# Patient Record
Sex: Female | Born: 1965 | Race: White | Hispanic: No | Marital: Married | Smoking: Former smoker
Health system: Southern US, Community
[De-identification: ages and names within clinical notes are randomized; demographics above are authoritative.]

## PROBLEM LIST (undated history)

## (undated) DIAGNOSIS — E119 Type 2 diabetes mellitus without complications: Secondary | ICD-10-CM

## (undated) DIAGNOSIS — I10 Essential (primary) hypertension: Secondary | ICD-10-CM

## (undated) DIAGNOSIS — E785 Hyperlipidemia, unspecified: Secondary | ICD-10-CM

## (undated) DIAGNOSIS — G4730 Sleep apnea, unspecified: Secondary | ICD-10-CM

## (undated) DIAGNOSIS — T7840XA Allergy, unspecified, initial encounter: Secondary | ICD-10-CM

## (undated) DIAGNOSIS — K219 Gastro-esophageal reflux disease without esophagitis: Secondary | ICD-10-CM

## (undated) HISTORY — DX: Allergy, unspecified, initial encounter: T78.40XA

## (undated) HISTORY — DX: Hyperlipidemia, unspecified: E78.5

## (undated) HISTORY — DX: Sleep apnea, unspecified: G47.30

## (undated) HISTORY — DX: Type 2 diabetes mellitus without complications: E11.9

## (undated) HISTORY — DX: Gastro-esophageal reflux disease without esophagitis: K21.9

---

## 2007-02-15 ENCOUNTER — Inpatient Hospital Stay (HOSPITAL_COMMUNITY): Admission: AD | Admit: 2007-02-15 | Discharge: 2007-02-15 | Payer: Self-pay | Admitting: Obstetrics and Gynecology

## 2007-02-15 ENCOUNTER — Emergency Department (HOSPITAL_COMMUNITY): Admission: EM | Admit: 2007-02-15 | Discharge: 2007-02-15 | Payer: Self-pay | Admitting: Emergency Medicine

## 2011-02-14 ENCOUNTER — Observation Stay (HOSPITAL_COMMUNITY): Payer: Worker's Compensation

## 2011-02-14 ENCOUNTER — Encounter (HOSPITAL_COMMUNITY)
Admission: EM | Disposition: A | Discharge status: Home / Self Care | Payer: Self-pay | Source: Home / Self Care | Attending: Emergency Medicine

## 2011-02-14 ENCOUNTER — Observation Stay (HOSPITAL_COMMUNITY)
Admission: EM | Admit: 2011-02-14 | Discharge: 2011-02-15 | Disposition: A | Discharge status: Home / Self Care | Payer: Worker's Compensation | Attending: Neurological Surgery | Admitting: Neurological Surgery

## 2011-02-14 ENCOUNTER — Emergency Department (HOSPITAL_COMMUNITY): Payer: Worker's Compensation

## 2011-02-14 ENCOUNTER — Encounter (HOSPITAL_COMMUNITY): Payer: Self-pay | Admitting: Emergency Medicine

## 2011-02-14 ENCOUNTER — Other Ambulatory Visit: Payer: Self-pay

## 2011-02-14 ENCOUNTER — Encounter (HOSPITAL_COMMUNITY): Payer: Self-pay | Admitting: Certified Registered"

## 2011-02-14 ENCOUNTER — Observation Stay (HOSPITAL_COMMUNITY): Payer: Worker's Compensation | Admitting: Certified Registered"

## 2011-02-14 DIAGNOSIS — R209 Unspecified disturbances of skin sensation: Secondary | ICD-10-CM | POA: Insufficient documentation

## 2011-02-14 DIAGNOSIS — M50223 Other cervical disc displacement at C6-C7 level: Secondary | ICD-10-CM | POA: Diagnosis present

## 2011-02-14 DIAGNOSIS — M47812 Spondylosis without myelopathy or radiculopathy, cervical region: Secondary | ICD-10-CM | POA: Insufficient documentation

## 2011-02-14 DIAGNOSIS — M5020 Other cervical disc displacement, unspecified cervical region: Principal | ICD-10-CM | POA: Insufficient documentation

## 2011-02-14 DIAGNOSIS — I10 Essential (primary) hypertension: Secondary | ICD-10-CM | POA: Insufficient documentation

## 2011-02-14 DIAGNOSIS — Z23 Encounter for immunization: Secondary | ICD-10-CM | POA: Insufficient documentation

## 2011-02-14 DIAGNOSIS — F17200 Nicotine dependence, unspecified, uncomplicated: Secondary | ICD-10-CM | POA: Insufficient documentation

## 2011-02-14 DIAGNOSIS — M79609 Pain in unspecified limb: Secondary | ICD-10-CM | POA: Insufficient documentation

## 2011-02-14 HISTORY — DX: Essential (primary) hypertension: I10

## 2011-02-14 HISTORY — PX: ANTERIOR CERVICAL DECOMP/DISCECTOMY FUSION: SHX1161

## 2011-02-14 LAB — CBC
MCH: 29.3 pg (ref 26.0–34.0)
MCV: 86.8 fL (ref 78.0–100.0)
Platelets: 280 10*3/uL (ref 150–400)
RBC: 4.91 MIL/uL (ref 3.87–5.11)
RDW: 13.5 % (ref 11.5–15.5)
WBC: 11.3 10*3/uL — ABNORMAL HIGH (ref 4.0–10.5)

## 2011-02-14 LAB — BASIC METABOLIC PANEL
Calcium: 8.5 mg/dL (ref 8.4–10.5)
Creatinine, Ser: 0.82 mg/dL (ref 0.50–1.10)
GFR calc non Af Amer: 85 mL/min — ABNORMAL LOW (ref >90)
Glucose, Bld: 121 mg/dL — ABNORMAL HIGH (ref 70–99)
Sodium: 138 mEq/L (ref 135–145)

## 2011-02-14 LAB — URINALYSIS, MICROSCOPIC ONLY
Bilirubin Urine: NEGATIVE
Nitrite: NEGATIVE
Protein, ur: 30 mg/dL — AB
Specific Gravity, Urine: 1.021 (ref 1.005–1.030)
Urobilinogen, UA: 0.2 mg/dL (ref 0.0–1.0)

## 2011-02-14 LAB — CK: Total CK: 78 U/L (ref 7–177)

## 2011-02-14 SURGERY — ANTERIOR CERVICAL DECOMPRESSION/DISCECTOMY FUSION 1 LEVEL
Anesthesia: General | Site: Neck | Wound class: Clean

## 2011-02-14 MED ORDER — HYDROMORPHONE HCL PF 1 MG/ML IJ SOLN
1.0000 mg | Freq: Once | INTRAMUSCULAR | Status: AC
  Administered 2011-02-14: 1 mg via INTRAVENOUS
  Filled 2011-02-14: qty 1

## 2011-02-14 MED ORDER — SUCCINYLCHOLINE CHLORIDE 20 MG/ML IJ SOLN
INTRAMUSCULAR | Status: DC | PRN
  Administered 2011-02-14: 100 mg via INTRAVENOUS

## 2011-02-14 MED ORDER — PROPOFOL 10 MG/ML IV EMUL
INTRAVENOUS | Status: DC | PRN
  Administered 2011-02-14: 200 mg via INTRAVENOUS

## 2011-02-14 MED ORDER — ONDANSETRON HCL 4 MG/2ML IJ SOLN
4.0000 mg | INTRAMUSCULAR | Status: DC | PRN
  Administered 2011-02-14: 4 mg via INTRAVENOUS
  Filled 2011-02-14: qty 2

## 2011-02-14 MED ORDER — ROCURONIUM BROMIDE 100 MG/10ML IV SOLN
INTRAVENOUS | Status: DC | PRN
  Administered 2011-02-14: 40 mg via INTRAVENOUS
  Administered 2011-02-14 (×2): 10 mg via INTRAVENOUS

## 2011-02-14 MED ORDER — BUPIVACAINE HCL (PF) 0.25 % IJ SOLN
INTRAMUSCULAR | Status: DC | PRN
  Administered 2011-02-14: 6 mL

## 2011-02-14 MED ORDER — PHENYLEPHRINE HCL 10 MG/ML IJ SOLN
INTRAMUSCULAR | Status: DC | PRN
  Administered 2011-02-14: 40 ug via INTRAVENOUS

## 2011-02-14 MED ORDER — KETOROLAC TROMETHAMINE 30 MG/ML IJ SOLN
INTRAMUSCULAR | Status: AC
  Filled 2011-02-14: qty 1

## 2011-02-14 MED ORDER — HEMOSTATIC AGENTS (NO CHARGE) OPTIME
TOPICAL | Status: DC | PRN
  Administered 2011-02-14: 1 via TOPICAL

## 2011-02-14 MED ORDER — DEXAMETHASONE SODIUM PHOSPHATE 10 MG/ML IJ SOLN
10.0000 mg | Freq: Once | INTRAMUSCULAR | Status: AC
  Administered 2011-02-14: 10 mg via INTRAVENOUS
  Filled 2011-02-14: qty 1

## 2011-02-14 MED ORDER — LISINOPRIL 10 MG PO TABS
10.0000 mg | ORAL_TABLET | Freq: Every day | ORAL | Status: DC
  Administered 2011-02-15: 10 mg via ORAL
  Filled 2011-02-14 (×2): qty 1

## 2011-02-14 MED ORDER — CEFAZOLIN SODIUM 1-5 GM-% IV SOLN
INTRAVENOUS | Status: DC | PRN
  Administered 2011-02-14: 2 g via INTRAVENOUS

## 2011-02-14 MED ORDER — KETOROLAC TROMETHAMINE 30 MG/ML IJ SOLN
15.0000 mg | Freq: Once | INTRAMUSCULAR | Status: AC | PRN
  Administered 2011-02-14: 30 mg via INTRAVENOUS

## 2011-02-14 MED ORDER — MORPHINE SULFATE 4 MG/ML IJ SOLN: 1.0000 mg | INTRAMUSCULAR | Status: DC | PRN

## 2011-02-14 MED ORDER — NEOSTIGMINE METHYLSULFATE 1 MG/ML IJ SOLN
INTRAMUSCULAR | Status: DC | PRN
  Administered 2011-02-14: 5 mg via INTRAVENOUS

## 2011-02-14 MED ORDER — PNEUMOCOCCAL VAC POLYVALENT 25 MCG/0.5ML IJ INJ
0.5000 mL | INJECTION | INTRAMUSCULAR | Status: AC
  Administered 2011-02-15: 0.5 mL via INTRAMUSCULAR
  Filled 2011-02-14: qty 0.5

## 2011-02-14 MED ORDER — ACETAMINOPHEN 325 MG PO TABS: 650.0000 mg | ORAL_TABLET | ORAL | Status: DC | PRN

## 2011-02-14 MED ORDER — PROMETHAZINE HCL 25 MG/ML IJ SOLN
6.2500 mg | INTRAMUSCULAR | Status: DC | PRN
  Filled 2011-02-14: qty 1

## 2011-02-14 MED ORDER — ALBUTEROL SULFATE (5 MG/ML) 0.5% IN NEBU
2.5000 mg | INHALATION_SOLUTION | Freq: Four times a day (QID) | RESPIRATORY_TRACT | Status: DC | PRN
  Administered 2011-02-14: 2.5 mg via RESPIRATORY_TRACT

## 2011-02-14 MED ORDER — SODIUM CHLORIDE 0.9 % IJ SOLN: 3.0000 mL | INTRAMUSCULAR | Status: DC | PRN

## 2011-02-14 MED ORDER — MIDAZOLAM HCL 5 MG/5ML IJ SOLN
INTRAMUSCULAR | Status: DC | PRN
  Administered 2011-02-14: 2 mg via INTRAVENOUS

## 2011-02-14 MED ORDER — SODIUM CHLORIDE 0.9 % IV SOLN: 250.0000 mL | INTRAVENOUS | Status: DC

## 2011-02-14 MED ORDER — 0.9 % SODIUM CHLORIDE (POUR BTL) OPTIME
TOPICAL | Status: DC | PRN
  Administered 2011-02-14: 1000 mL

## 2011-02-14 MED ORDER — LIDOCAINE-EPINEPHRINE 1 %-1:100000 IJ SOLN
INTRAMUSCULAR | Status: DC | PRN
  Administered 2011-02-14: 6 mL

## 2011-02-14 MED ORDER — FENTANYL CITRATE 0.05 MG/ML IJ SOLN
INTRAMUSCULAR | Status: DC | PRN
  Administered 2011-02-14: 50 ug via INTRAVENOUS
  Administered 2011-02-14: 150 ug via INTRAVENOUS

## 2011-02-14 MED ORDER — LACTATED RINGERS IV SOLN
INTRAVENOUS | Status: DC | PRN
  Administered 2011-02-14 (×2): via INTRAVENOUS

## 2011-02-14 MED ORDER — HYDROMORPHONE HCL PF 1 MG/ML IJ SOLN: 0.2500 mg | INTRAMUSCULAR | Status: DC | PRN

## 2011-02-14 MED ORDER — MORPHINE SULFATE 4 MG/ML IJ SOLN
8.0000 mg | Freq: Once | INTRAMUSCULAR | Status: AC
  Administered 2011-02-14: 8 mg via INTRAVENOUS
  Filled 2011-02-14: qty 2

## 2011-02-14 MED ORDER — ACETAMINOPHEN 650 MG RE SUPP: 650.0000 mg | RECTAL | Status: DC | PRN

## 2011-02-14 MED ORDER — ALUM & MAG HYDROXIDE-SIMETH 200-200-20 MG/5ML PO SUSP: 30.0000 mL | Freq: Four times a day (QID) | ORAL | Status: DC | PRN

## 2011-02-14 MED ORDER — ONDANSETRON HCL 4 MG/2ML IJ SOLN
INTRAMUSCULAR | Status: DC | PRN
  Administered 2011-02-14: 4 mg via INTRAVENOUS

## 2011-02-14 MED ORDER — THROMBIN 5000 UNITS EX SOLR
CUTANEOUS | Status: DC | PRN
  Administered 2011-02-14 (×2): 5000 [IU] via TOPICAL

## 2011-02-14 MED ORDER — GADOBENATE DIMEGLUMINE 529 MG/ML IV SOLN
20.0000 mL | Freq: Once | INTRAVENOUS | Status: AC
  Administered 2011-02-14: 20 mL via INTRAVENOUS

## 2011-02-14 MED ORDER — ONDANSETRON HCL 4 MG/2ML IJ SOLN: 4.0000 mg | INTRAMUSCULAR | Status: DC | PRN

## 2011-02-14 MED ORDER — OXYCODONE-ACETAMINOPHEN 5-325 MG PO TABS: 1.0000 | ORAL_TABLET | ORAL | Status: DC | PRN

## 2011-02-14 MED ORDER — SODIUM CHLORIDE 0.9 % IJ SOLN: 3.0000 mL | Freq: Two times a day (BID) | INTRAMUSCULAR | Status: DC

## 2011-02-14 MED ORDER — LACTATED RINGERS IV SOLN: INTRAVENOUS | Status: DC

## 2011-02-14 MED ORDER — SODIUM CHLORIDE 0.9 % IV SOLN
INTRAVENOUS | Status: DC
  Administered 2011-02-14: 18:00:00 via INTRAVENOUS

## 2011-02-14 MED ORDER — DIAZEPAM 5 MG PO TABS: 5.0000 mg | ORAL_TABLET | Freq: Four times a day (QID) | ORAL | Status: DC | PRN

## 2011-02-14 MED ORDER — MEPERIDINE HCL 25 MG/ML IJ SOLN: 6.2500 mg | INTRAMUSCULAR | Status: DC | PRN

## 2011-02-14 MED ORDER — GLYCOPYRROLATE 0.2 MG/ML IJ SOLN
INTRAMUSCULAR | Status: DC | PRN
  Administered 2011-02-14: .7 mg via INTRAVENOUS

## 2011-02-14 MED ORDER — SODIUM CHLORIDE 0.9 % IR SOLN
Status: DC | PRN
  Administered 2011-02-14: 20:00:00

## 2011-02-14 SURGICAL SUPPLY — 61 items
BAG DECANTER FOR FLEXI CONT (MISCELLANEOUS) ×2 IMPLANT
BANDAGE GAUZE ELAST BULKY 4 IN (GAUZE/BANDAGES/DRESSINGS) ×4 IMPLANT
BIT DRILL 14MM (INSTRUMENTS) ×1 IMPLANT
BIT DRILL NEURO 2X3.1 SFT TUCH (MISCELLANEOUS) ×1 IMPLANT
BUR BARREL STRAIGHT FLUTE 4.0 (BURR) ×2 IMPLANT
CAGE CERVICAL TRANZGRAFT 7MM (Cage) ×2 IMPLANT
CANISTER SUCTION 2500CC (MISCELLANEOUS) ×2 IMPLANT
CLOTH BEACON ORANGE TIMEOUT ST (SAFETY) ×2 IMPLANT
CONT SPEC 4OZ CLIKSEAL STRL BL (MISCELLANEOUS) ×4 IMPLANT
DECANTER SPIKE VIAL GLASS SM (MISCELLANEOUS) ×2 IMPLANT
DERMABOND ADVANCED (GAUZE/BANDAGES/DRESSINGS) ×1
DERMABOND ADVANCED .7 DNX12 (GAUZE/BANDAGES/DRESSINGS) ×1 IMPLANT
DRAPE LAPAROTOMY 100X72 PEDS (DRAPES) ×2 IMPLANT
DRAPE MICROSCOPE LEICA (MISCELLANEOUS) IMPLANT
DRAPE POUCH INSTRU U-SHP 10X18 (DRAPES) ×2 IMPLANT
DRESSING TELFA 8X3 (GAUZE/BANDAGES/DRESSINGS) IMPLANT
DRILL 14MM (INSTRUMENTS) ×2
DRILL NEURO 2X3.1 SOFT TOUCH (MISCELLANEOUS) ×2
DRSG OPSITE 4X5.5 SM (GAUZE/BANDAGES/DRESSINGS) IMPLANT
DURAPREP 6ML APPLICATOR 50/CS (WOUND CARE) ×2 IMPLANT
ELECT REM PT RETURN 9FT ADLT (ELECTROSURGICAL) ×2
ELECTRODE REM PT RTRN 9FT ADLT (ELECTROSURGICAL) ×1 IMPLANT
GAUZE SPONGE 4X4 16PLY XRAY LF (GAUZE/BANDAGES/DRESSINGS) IMPLANT
GLOVE BIO SURGEON STRL SZ 6 (GLOVE) ×4 IMPLANT
GLOVE BIO SURGEON STRL SZ7.5 (GLOVE) IMPLANT
GLOVE BIOGEL PI IND STRL 6.5 (GLOVE) ×1 IMPLANT
GLOVE BIOGEL PI IND STRL 7.5 (GLOVE) IMPLANT
GLOVE BIOGEL PI IND STRL 8 (GLOVE) ×1 IMPLANT
GLOVE BIOGEL PI IND STRL 8.5 (GLOVE) ×1 IMPLANT
GLOVE BIOGEL PI INDICATOR 6.5 (GLOVE) ×1
GLOVE BIOGEL PI INDICATOR 7.5 (GLOVE)
GLOVE BIOGEL PI INDICATOR 8 (GLOVE) ×1
GLOVE BIOGEL PI INDICATOR 8.5 (GLOVE) ×1
GLOVE ECLIPSE 7.5 STRL STRAW (GLOVE) ×2 IMPLANT
GLOVE ECLIPSE 8.5 STRL (GLOVE) ×2 IMPLANT
GLOVE EXAM NITRILE LRG STRL (GLOVE) IMPLANT
GLOVE EXAM NITRILE MD LF STRL (GLOVE) IMPLANT
GLOVE EXAM NITRILE XL STR (GLOVE) IMPLANT
GLOVE EXAM NITRILE XS STR PU (GLOVE) IMPLANT
GOWN BRE IMP SLV AUR LG STRL (GOWN DISPOSABLE) ×4 IMPLANT
GOWN BRE IMP SLV AUR XL STRL (GOWN DISPOSABLE) IMPLANT
GOWN STRL REIN 2XL LVL4 (GOWN DISPOSABLE) ×2 IMPLANT
HEAD HALTER (SOFTGOODS) ×2 IMPLANT
KIT BASIN OR (CUSTOM PROCEDURE TRAY) ×2 IMPLANT
KIT ROOM TURNOVER OR (KITS) ×2 IMPLANT
NEEDLE HYPO 22GX1.5 SAFETY (NEEDLE) ×2 IMPLANT
NEEDLE SPNL 22GX3.5 QUINCKE BK (NEEDLE) ×2 IMPLANT
NS IRRIG 1000ML POUR BTL (IV SOLUTION) ×2 IMPLANT
PACK LAMINECTOMY NEURO (CUSTOM PROCEDURE TRAY) ×2 IMPLANT
PAD ARMBOARD 7.5X6 YLW CONV (MISCELLANEOUS) ×6 IMPLANT
PLATE TRESTLE LUXE 12 1LVL (Plate) ×2 IMPLANT
PUTTY BONE 2.5CC ×2 IMPLANT
RUBBERBAND STERILE (MISCELLANEOUS) IMPLANT
SCREW 14MM (Screw) ×8 IMPLANT
SPONGE INTESTINAL PEANUT (DISPOSABLE) ×2 IMPLANT
SPONGE SURGIFOAM ABS GEL SZ50 (HEMOSTASIS) ×2 IMPLANT
SUT VIC AB 3-0 SH 8-18 (SUTURE) ×2 IMPLANT
SYR 20ML ECCENTRIC (SYRINGE) ×2 IMPLANT
TOWEL OR 17X24 6PK STRL BLUE (TOWEL DISPOSABLE) ×2 IMPLANT
TOWEL OR 17X26 10 PK STRL BLUE (TOWEL DISPOSABLE) ×2 IMPLANT
WATER STERILE IRR 1000ML POUR (IV SOLUTION) ×2 IMPLANT

## 2011-02-14 NOTE — ED Notes (Signed)
Pt ambulatory to restroom with s/o in no acute distress. Denies needs or complaints.

## 2011-02-14 NOTE — ED Provider Notes (Signed)
History     CSN: [id number]  Arrival date & time 02/14/11  4098   First MD Initiated Contact with Patient 02/14/11 [phone]      Chief Complaint  Patient presents with  . 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 states pain in right arm since no cp or sob. no burn noted.    (Consider location/radiation/quality/duration/timing/severity/associated sxs/prior treatment) HPI Comments: Patient presents to the emergency department with chief complaint of right arm pain.  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  Ever since this incident patient has had numbness and tingling of the extremity.  She states she has weakness of his extremities well.  Patient denies headaches, skin burns, shortness of breath, chest pain, chills.  Patient states the incident has been associated with nausea and diaphoresis.  Patient has no other current complaints.  The history is provided by the patient.    Past Medical History  Diagnosis Date  . Hypertension     History reviewed. No pertinent past surgical history.  History reviewed. No pertinent family history.  History  Substance Use Topics  . Smoking status: Current Everyday Smoker -- 0.5 packs/day    Types: Cigarettes  . Smokeless tobacco: Not on file  . Alcohol Use: Yes     occassionally    OB History    Grav Para Term Preterm Abortions TAB SAB Ect Mult Living                  Review of Systems  Constitutional: Negative for fever, chills and appetite change.  HENT: Negative for congestion.   Eyes: Negative for visual disturbance.  Respiratory: Negative for shortness of breath.   Cardiovascular: Negative for chest pain and leg swelling.  Gastrointestinal: Negative for abdominal pain.  Genitourinary: Negative for dysuria, urgency and frequency.  Musculoskeletal: Positive for  myalgias.  Neurological: Positive for weakness and numbness. Negative for dizziness, tremors, syncope, facial asymmetry, speech difficulty, light-headedness and headaches.  Psychiatric/Behavioral: Negative for confusion.  All other systems reviewed and are negative.    Allergies  Sulfa antibiotics  Home Medications   Current Outpatient Rx  Name Route Sig Dispense Refill  . VITAMIN D PO Oral Take 1 capsule by mouth 2 (two) times daily.    Marland Kitchen LISINOPRIL 10 MG PO TABS Oral Take 10 mg by mouth at bedtime.      BP 108/82  Pulse 71  Temp(Src) 98 F (36.7 C) (Oral)  Resp 18  SpO2 98%  LMP 01/29/2011  Physical Exam  Nursing note and vitals reviewed. Constitutional: She is oriented to person, place, and time. She appears well-developed and well-nourished. No distress.  HENT:  Head: Normocephalic and atraumatic.  Eyes: Conjunctivae and EOM are normal. Pupils are equal, round, and reactive to light. No scleral icterus.  Neck: Normal range of motion. Neck supple. No JVD present. Spinous process tenderness and muscular tenderness present. Carotid bruit is not present. No rigidity. No Brudzinski's sign noted.         Tenderness to palpation of C-spine.  Numbness and tingling of right upper extremity with flexion and extension of neck.   Cardiovascular: Normal rate, regular rhythm, normal heart sounds and intact distal pulses.   Pulmonary/Chest: Effort normal and breath sounds normal. No respiratory distress. She has no wheezes. She has no rales.  Musculoskeletal: Normal  range of motion.       Right shoulder: She exhibits tenderness, bony tenderness and pain. She exhibits normal strength.       Right elbow: tenderness found.       Right wrist: She exhibits tenderness and bony tenderness.       No injection no weakness noted on physical exam.  Pain with passive and active range of motion of right wrist, elbow, and shoulder.  2 point discrimination intact.  Lymphadenopathy:    She has no  cervical adenopathy.  Neurological: She is alert and oriented to person, place, and time. She has normal strength. No cranial nerve deficit or sensory deficit. She displays a negative Romberg sign. Coordination and gait normal. GCS eye subscore is 4. GCS verbal subscore is 5. GCS motor subscore is 6.       A&O x3.  Able to follow commands. PERRL, EOMs, no vertical or bidirectional nystagmus. Shoulder shrug, facial muscles, tongue protrusion and swallow intact.  Motor strength 5/5 bilaterally including grip strength, triceps, hamstrings and ankle dorsiflexion.  Normal patellar DTRs.  Light touch intact in all 4 distal limbs.  Intact finger to nose, shin to heel and rapid alternating movements. No ataxia or dysequilibrium.   Skin: Skin is warm and dry. No rash noted. She is not diaphoretic.  Psychiatric: She has a normal mood and affect. Her behavior is normal.    ED Course  Procedures (including critical care time)  Labs Reviewed  BASIC METABOLIC PANEL - Abnormal; Notable for the following:    Glucose, Bld 121 (*)    GFR calc non Af Amer 85 (*)    All other components within normal limits  CBC - Abnormal; Notable for the following:    WBC 11.3 (*)    All other components within normal limits  CK   Dg Cervical Spine Complete  02/14/2011  *RADIOLOGY REPORT*  Clinical Data: Right neck pain and arm pain  CERVICAL SPINE - COMPLETE 4+ VIEW  Comparison: None.  Findings: The odontoid is intact and the lateral masses are well- aligned.  The AP and lateral cervical alignment are normal.  The prevertebral soft tissue stripe is within normal limits.  The oblique views reveal no gross osseous encroachment upon the neural foramina bilaterally. There is no evidence of fracture or dislocation.  IMPRESSION: Normal cervical spine.  If there is clinical concern regarding herniated disc, MRI may be of help.  Original Report Authenticated By: Brandon Melnick, M.D.   Dg Shoulder Right  02/14/2011  *RADIOLOGY REPORT*   Clinical Data: Right arm pain  RIGHT SHOULDER - 2+ VIEW  Comparison: None.  Findings: [facility] joint is intact and the subacromial space is maintained.  There is no evidence of fracture or dislocation.  No osseous lesions.  IMPRESSION: Normal right shoulder.  If there is clinical concern regarding a rotator cuff injury, MRI may be of help.  Original Report Authenticated By: Brandon Melnick, M.D.   Dg Elbow Complete Right  02/14/2011  *RADIOLOGY REPORT*  Clinical Data: Right arm pain  RIGHT ELBOW - COMPLETE 3+ VIEW  Comparison: None.  Findings: There is no evidence of bone, joint, or soft tissue abnormality.  IMPRESSION: Negative right elbow.  Original Report Authenticated By: Brandon Melnick, M.D.   Dg Wrist Complete Right  02/14/2011  *RADIOLOGY REPORT*  Clinical Data: Right arm pain  RIGHT WRIST - COMPLETE 3+ VIEW  Comparison: None.  Findings: There is no evidence of bone, joint, or soft tissue  abnormality.  IMPRESSION: Negative right wrist.  Original Report Authenticated By: Brandon Melnick, M.D.     No diagnosis found.    MDM  Paresthesias, right arm  Patient being transferred to CDU for observation and pending MRI of neck.  Patient has been seen and discussed with Dr. Brooke Dare who agrees with plan to move the patient.  Patient's labs and radiologic results have been discussed as well as the plan to move to CDU.  Patient is agreeable with plan.  Patient's pain is currently been managed in the emergency department with morphine and she currently has no complaints.  Grant Fontana PA-C will resume patient care and we'll treat patient accordingly in regards to results of MRI.  If results are within normal limits it is recommended the patient be sent home with gabapentin painkillers.        Jaci Carrel, [place] 02/14/11 1150

## 2011-02-14 NOTE — Anesthesia Postprocedure Evaluation (Signed)
  Anesthesia Post-op Note  Patient: Janice Lucas  Procedure(s) Performed:  ANTERIOR CERVICAL DECOMPRESSION/DISCECTOMY FUSION 1 LEVEL - Anterior Cervical Six-Seven Decompression and Fusion  Patient Location: PACU  Anesthesia Type: General  Level of Consciousness: awake  Airway and Oxygen Therapy: Patient Spontanous Breathing  Post-op Pain: mild  Post-op Assessment: Post-op Vital signs reviewed  Post-op Vital Signs: stable  Complications: No apparent anesthesia complications

## 2011-02-14 NOTE — ED Provider Notes (Signed)
Medical screening examination/treatment/procedure(s) were conducted as a shared visit with non-physician practitioner(s) and myself.  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 She had a shock she up her right upper terminate into her neck. She's had paresthesias since.  5 out of 5 strength in the right upper extremity. She has for range of motion although with pain. Strong grip strength.  Imaging was reviewed and negative for fracture. She will receive an MRI of her cervical spine to rule out radicular symptoms. She'll be observed in the CDU. Pain control. Likely discharge  Dayton Bailiff, MD 02/14/11 1155

## 2011-02-14 NOTE — H&P (Signed)
Stefani Dama, MD Physician Signed Neurosurgery Consult Note 02/14/2011 5:21 PM   Chief Complaint: herniated nucleus pulposus C6-C7 right  Referring Physician: Dr. Dayton Bailiff  Janice Lucas is an 46 y.o. female.  HPI: Patient is a 46 year old right-handed white female who has had the sudden and severe onset of pain in actual shoulder and right arm. 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 Since that time the pain has persisted and she has tingling dysesthesias into the second third and fourth digits on the right hand in addition to pain in the shoulder or the region of the scapula with radiation into the triceps region and also into the form of the right hand. She is evaluated [facility] where an MRI of the cervical spine was performed, this demonstrates the presence of an extruded fragment of disc at C6-C7 on the right side. There is effacement of the right side of the cord.    Past Medical History    Diagnosis  Date    .  Hypertension      History reviewed. No pertinent past surgical history.  History reviewed. No pertinent family history.  Social History: reports that she has been smoking Cigarettes. She has been smoking about .5 packs per day. She does not have any smokeless tobacco history on file. She reports that she drinks alcohol. Her drug history not on file.  Allergies:    Allergies    Allergen  Reactions    .  Sulfa Antibiotics  Other (See Comments)      Acts "goofy"     Medications: I have reviewed the patient's current medications.    Results for orders placed during the hospital encounter of 02/14/11 (from the past 48 hour(s))    BASIC METABOLIC PANEL Status: Abnormal     Collection Time     02/14/11 10:19 AM    Component  Value  Range  Comment     Sodium  138  135 - 145 (mEq/L)      Potassium  4.1  3.5 - 5.1 (mEq/L)      Chloride  102  96 - 112 (mEq/L)        CO2  24  19 - 32 (mEq/L)      Glucose, Bld  121 (*)  70 - 99 (mg/dL)      BUN  15  6 - 23 (mg/dL)      Creatinine, Ser  0.82  0.50 - 1.10 (mg/dL)      Calcium  8.5  8.4 - 10.5 (mg/dL)      GFR calc non Af Amer  85 (*)  >90 (mL/min)      GFR calc Af Amer  >90  >90 (mL/min)     CBC Status: Abnormal     Collection Time     02/14/11 10:19 AM    Component  Value  Range  Comment     WBC  11.3 (*)  4.0 - 10.5 (K/uL)      RBC  4.91  3.87 - 5.11 (MIL/uL)      Hemoglobin  14.4  12.0 - 15.0 (g/dL)      HCT  16.1  09.6 - 46.0 (%)      MCV  86.8  78.0 - 100.0 (fL)      MCH  29.3  26.0 - 34.0 (pg)      MCHC  33.8  30.0 - 36.0 (g/dL)      RDW  16.1  09.6 - 15.5 (%)      Platelets  280  150 - 400 (K/uL)     CK Status: Normal     Collection Time     02/14/11 10:19 AM    Component  Value  Range  Comment     Total CK  78  7 - 177 (U/L)     URINALYSIS, WITH MICROSCOPIC Status: Abnormal     Collection Time     02/14/11 12:43 PM    Component  Value  Range  Comment     Color, Urine  YELLOW  YELLOW      APPearance  CLEAR  CLEAR      Specific Gravity, Urine  1.021  1.005 - 1.030      pH  6.5  5.0 - 8.0      Glucose, UA  NEGATIVE  NEGATIVE (mg/dL)      Hgb urine dipstick  SMALL (*)  NEGATIVE      Bilirubin Urine  NEGATIVE  NEGATIVE      Ketones, ur  NEGATIVE  NEGATIVE (mg/dL)      Protein, ur  30 (*)  NEGATIVE (mg/dL)      Urobilinogen, UA  0.2  0.0 - 1.0 (mg/dL)      Nitrite  NEGATIVE  NEGATIVE      Leukocytes, UA  NEGATIVE  NEGATIVE      WBC, UA  0-2  <3 (WBC/hpf)      RBC / HPF  0-2  <3 (RBC/hpf)      Bacteria, UA  RARE  RARE      Squamous Epithelial / LPF  RARE  RARE      Urine-Other  MUCOUS PRESENT       Dg Cervical Spine Complete  02/14/2011 *RADIOLOGY REPORT* Clinical Data: Right neck pain and arm pain CERVICAL SPINE - COMPLETE 4+ VIEW Comparison: None. Findings: The odontoid is intact and the lateral masses are well- aligned. The AP and lateral cervical alignment are normal. The prevertebral  soft tissue stripe is within normal limits. The oblique views reveal no gross osseous encroachment upon the neural foramina bilaterally. There is no evidence of fracture or dislocation. IMPRESSION: Normal cervical spine. If there is clinical concern regarding herniated disc, MRI may be of help. Original Report Authenticated By: Brandon Melnick, M.D.  Dg Shoulder Right  02/14/2011 *RADIOLOGY REPORT* Clinical Data: Right arm pain RIGHT SHOULDER - 2+ VIEW Comparison: None. Findings: The [facility] joint is intact and the subacromial space is maintained. There is no evidence of fracture or dislocation. No osseous lesions. IMPRESSION: Normal right shoulder. If there is clinical concern regarding a rotator cuff injury, MRI may be of help. Original Report Authenticated By: Brandon Melnick, M.D.  Dg Elbow Complete Right  02/14/2011 *RADIOLOGY REPORT* Clinical Data: Right arm pain RIGHT ELBOW - COMPLETE 3+ VIEW Comparison: None. Findings: There is no evidence of bone, joint, or soft tissue abnormality. IMPRESSION: Negative right elbow. Original Report Authenticated By: Brandon Melnick, M.D.  Dg Wrist Complete Right  02/14/2011 *RADIOLOGY REPORT* Clinical Data: Right arm pain RIGHT WRIST - COMPLETE 3+ VIEW Comparison: None. Findings: There is no evidence of bone, joint, or soft tissue abnormality. IMPRESSION: Negative right wrist. Original Report Authenticated By: Brandon Melnick, M.D.  Mr Cervical Spine W Wo Contrast  02/14/2011 *RADIOLOGY REPORT* Clinical Data: Electric shock injury. Neck and right arm pain. MRI CERVICAL SPINE WITHOUT  AND WITH CONTRAST Technique: Multiplanar and multiecho pulse sequences of the cervical spine, to include the craniocervical junction and cervicothoracic junction, were obtained according to standard protocol without and with intravenous contrast. Contrast: 20mL MULTIHANCE GADOBENATE DIMEGLUMINE 529 MG/ML IV SOLN Comparison: 02/14/2011 radiographs Findings: Polypoid mucoperiosteal thickening in the left  maxillary sinus noted. Despite efforts by the patient and technologist, motion artifact is present on some series of today's examination and could not be totally eliminated. This reduces diagnostic sensitivity and specificity. The craniocervical junction appears unremarkable. Inversion recovery weighted images demonstrate no significant abnormal vertebral or periligamentous edema. No significant abnormal cord edema or cord enhancement is observed. Additional findings at individual levels are as follows: C2-3: Unremarkable. C3-4: Unremarkable. C4-5: Unremarkable. C5-6: Unremarkable. C6-7: A large right lateral recess disc protrusion is present and indents the right anterior aspect of the cervical cord, also causing moderate to prominent right foraminal stenosis. The disc protrusion extends caudad, and there is enhancement and high inversion recovery weighted signal in the adjacent epidural space compatible with inflammation or mild localized blood products in the epidural space. C7-T1: Unremarkable. IMPRESSION: 1. The dominant finding is a large right lateral recess and foraminal disc protrusion at C6-7 causing moderate to prominent right foraminal stenosis and right eccentric central stenosis. No abnormal cord signal or abnormal cord enhancement is observed, but there is some increased inversion recovery weighted signal and enhancement in the epidural tissues adjacent to the disc protrusion suggesting inflammation/edema or a small amount of blood products. 2. Chronic left maxillary sinusitis. Original Report Authenticated By: Dellia Cloud, M.D.   Review of Systems  Constitutional: Negative.  HENT: Positive for neck pain.  Eyes: Negative.  Respiratory: Negative.  Cardiovascular: Negative.  Gastrointestinal: Negative.  Genitourinary: Negative.  Skin: Negative.  Neurological: Positive for sensory change and focal weakness.  Endo/Heme/Allergies: Negative.  Psychiatric/Behavioral: Negative.   Blood  pressure 102/66, pulse 70, temperature 98.8 F (37.1 C), temperature source Oral, resp. rate 20, last menstrual period 01/29/2011, SpO2 100.00%.  Physical Exam  Constitutional: She is oriented to person, place, and time. She appears well-developed and well-nourished.  Neck:  Is tenderness to palpation in the right supraclavicular fossa turning to the right is limited to 45 she turns to the left with ease flexion extension is limited to 50% of normal area  Cardiovascular: Normal rate, regular rhythm and normal heart sounds.  Respiratory: Effort normal and breath sounds normal.  GI: Soft. Bowel sounds are normal.  Musculoskeletal:  Decreased strength in triceps finger extensors on the right side rib strength is also decreased 4/5 on the right compared to left.  Neurological: She is alert and oriented to person, place, and time. She displays abnormal reflex. No cranial nerve deficit. She exhibits normal muscle tone. Coordination normal.  Sensation is decreased in the midportion of the right hand particularly on the second and third digits. There is an absent triceps reflex on the right upper extremity.  Skin: Skin is warm and dry.  Psychiatric: She has a normal mood and affect. Her behavior is normal. Judgment and thought content normal.   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 As a new disc herniation but it's magnitude by the MRI is rather large and I discussed the options of conservative treatment versus surgical intervention to decompress the C6-C7 level. After some consideration the patient would like to proceed  with surgical intervention decompressed this process we'll make arrangements for the patient's admission at the current time.  Harjot Dibello J  02/14/2011, 5:21 PM

## 2011-02-14 NOTE — ED Notes (Signed)
Report called to [facility] in neuro or

## 2011-02-14 NOTE — Anesthesia Procedure Notes (Addendum)
Date/Time: 02/14/2011 8:26 PM Performed by: Ellin Goodie   Procedure Name: Intubation Date/Time: 02/14/2011 8:08 PM Performed by: Ellin Goodie Pre-anesthesia Checklist: Patient identified, Emergency Drugs available, Suction available, Patient being monitored and Timeout performed Patient Re-evaluated:Patient Re-evaluated prior to inductionOxygen Delivery Method: Circle System Utilized Preoxygenation: Pre-oxygenation with 100% oxygen Intubation Type: IV induction, Cricoid Pressure applied and Rapid sequence Ventilation: Mask ventilation without difficulty Laryngoscope Size: Mac and 4 Grade View: Grade I Tube type: Oral Tube size: 7.5 mm Number of attempts: 1 Airway Equipment and Method: stylet Placement Confirmation: ETT inserted through vocal cords under direct vision,  positive ETCO2 and breath sounds checked- equal and bilateral Secured at: 23 cm Tube secured with: Tape Dental Injury: Teeth and Oropharynx as per pre-operative assessment

## 2011-02-14 NOTE — ED Notes (Signed)
Pt napping at intervals. States she is comfortable at this time. Shoulder immobilizer in place. Cms intact. Family remains at bedside

## 2011-02-14 NOTE — ED Notes (Signed)
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 Pain in right arms since.
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
## 2011-02-14 NOTE — Transfer of Care (Signed)
Immediate Anesthesia Transfer of Care Note  Patient: Janice Lucas  Procedure(s) Performed:  ANTERIOR CERVICAL DECOMPRESSION/DISCECTOMY FUSION 1 LEVEL - Anterior Cervical Six-Seven Decompression and Fusion  Patient Location: PACU  Anesthesia Type: General  Level of Consciousness: awake  Airway & Oxygen Therapy: Patient Spontanous Breathing  Post-op Assessment: Report given to PACU RN  Post vital signs: stable  Complications: No apparent anesthesia complications

## 2011-02-14 NOTE — Progress Notes (Signed)
Orthopedic Tech Progress Note Patient Details:  Janice Lucas 1965/01/21 [id number]  Other Ortho Devices Ortho Device Location: immobilizer sling Ortho Device Interventions: Application   Cammer, Mickie Bail 02/14/2011, 2:16 PM

## 2011-02-14 NOTE — ED Notes (Signed)
Dr Danielle Dess has been in and is waiting for pt answer on having surgery today.

## 2011-02-14 NOTE — Anesthesia Preprocedure Evaluation (Addendum)
Anesthesia Evaluation  Patient identified by MRN, date of birth, ID band Patient awake    Reviewed: Allergy & Precautions, H&P , NPO status , Patient's Chart, lab work & pertinent test results, reviewed documented beta blocker date and time   Airway Mallampati: II  Neck ROM: Limited    Dental  (+) Teeth Intact   Pulmonary  clear to auscultation        Cardiovascular hypertension, Pt. on medications Regular Normal    Neuro/Psych    GI/Hepatic   Endo/Other  Morbid obesity  Renal/GU      Musculoskeletal   Abdominal (+) obese,  Abdomen: soft. Bowel sounds: normal.  Peds  Hematology   Anesthesia Other Findings   Reproductive/Obstetrics                         Anesthesia Physical Anesthesia Plan  ASA: II  Anesthesia Plan: General   Post-op Pain Management:    Induction: Intravenous  Airway Management Planned: Oral ETT  Additional Equipment:   Intra-op Plan:   Post-operative Plan: Extubation in OR  Informed Consent: I have reviewed the patients History and Physical, chart, labs and discussed the procedure including the risks, benefits and alternatives for the proposed anesthesia with the patient or authorized representative who has indicated his/her understanding and acceptance.   Dental advisory given  Plan Discussed with: CRNA and Surgeon  Anesthesia Plan Comments:         Anesthesia Quick Evaluation
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
## 2011-02-14 NOTE — Op Note (Signed)
Preoperative diagnosis: Cervical spondylosis with radiculopathy and herniated nucleus pulposus with right C7 radiculopathy C6-C7 Post operative diagnosis: Cervical spondylosis with radiculopathy C6-C7 with herniated nucleus pulposus right Procedure: Anterior cervical discectomy decompression of nerve roots and spinal canal C6-C7 arthrodesis with structural allograft, Alphatec plate fixation Z6-1 Surgeon: Barnett Abu M.D. Asst.: Nudelman M.D. Indications: Patient is a 46 year old individual who experienced acute onset of neck shoulder and right arm pain while reaching to pull a plug on an electric vehicle. He experienced the sudden and severe onset of pain in his had weakness in her right shoulder and arm. MRI demonstrates a large extruded fragment of disc at C6-C7 on the right side. Procedure: The patient was brought to the operating room placed on the table in supine position. After the smooth induction of general endotrachoupleeal anesthesia neck was placed in 5 pounds of halter traction and prepped with alcohol and DuraPrep. After sterile draping and appropriate timeout procedure a transverse incision was created in the left side of the neck and carried down to the platysma. The plane between the sternocleidomastoid and strap muscles dissected bluntly until the prevertebral space was reached. The first identifiable disc space was noted to be C4-C5 on a localizing radiograph. The dissection was then undertaken in the longus coli muscle to allow placement of a self-retaining Caspar type retractor.  The anterior longitudinal ligament was opened at C6-C7 and ventral osteophytes were removed with a Leksell rongeur and Kerrison punch. Interspace was cleared of significant quantity of the degenerated disc material in the region of the posterior longitudinal ligament was reached. Dissection was carried out using a high-speed drill and 3-0 Karlin curettes. Uncinate processes were drilled down and removed and  osteophytes from the inferior margin of the body of C6 were removed with a Kerrison 2 mm gold punch. After the central canal and lateral recesses were well decompressed the stasis was achieved with the bipolar cautery and some small pledgets of Gelfoam soaked in thrombin that were later irrigated away.  A 7 mm transgraft was then prepared by enlarging the central cavity and filling with demineralized bone matrix and placing into the interspace.  Next the retractor was removed and a 12 mm trestle plate was placed over the vertebral bodies and secured with 14 mm variable angle screws. A final localizing radiograph identified the position of the surgical construct. The stasis was achieved in the soft tissues and then the platysma was closed with 3-0 Vicryl in an interrupted fashion and 3-0 Vicryl was used in the subcuticular tissue. Blood loss was estimated at 50 cc.

## 2011-02-15 MED ORDER — DIAZEPAM 5 MG PO TABS: 5.0000 mg | ORAL_TABLET | Freq: Three times a day (TID) | ORAL | Status: AC | PRN

## 2011-02-15 MED ORDER — OXYCODONE-ACETAMINOPHEN 5-325 MG PO TABS: 1.0000 | ORAL_TABLET | ORAL | Status: AC | PRN

## 2011-02-15 MED ORDER — PHENOL 1.4 % MT LIQD: 1.0000 | OROMUCOSAL | Status: DC | PRN

## 2011-02-15 MED ORDER — MENTHOL 3 MG MT LOZG: 1.0000 | LOZENGE | OROMUCOSAL | Status: DC | PRN
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
## 2011-02-17 ENCOUNTER — Encounter (HOSPITAL_COMMUNITY): Payer: Self-pay | Admitting: Neurological Surgery

## 2011-02-17 MED FILL — Sodium Chloride IV Soln 0.9%: INTRAVENOUS | Qty: 500 | Status: AC

## 2013-08-05 ENCOUNTER — Other Ambulatory Visit: Payer: Self-pay | Admitting: Family Medicine

## 2013-08-05 ENCOUNTER — Other Ambulatory Visit (HOSPITAL_COMMUNITY)
Admission: RE | Admit: 2013-08-05 | Discharge: 2013-08-05 | Disposition: A | Discharge status: Home / Self Care | Payer: 59 | Source: Ambulatory Visit | Attending: Family Medicine | Admitting: Family Medicine

## 2013-08-05 DIAGNOSIS — Z124 Encounter for screening for malignant neoplasm of cervix: Secondary | ICD-10-CM | POA: Diagnosis present

## 2013-08-05 DIAGNOSIS — Z1151 Encounter for screening for human papillomavirus (HPV): Secondary | ICD-10-CM | POA: Insufficient documentation

## 2013-08-05 DIAGNOSIS — Z113 Encounter for screening for infections with a predominantly sexual mode of transmission: Secondary | ICD-10-CM | POA: Insufficient documentation

## 2013-08-09 ENCOUNTER — Other Ambulatory Visit: Payer: Self-pay | Admitting: Family Medicine

## 2013-08-09 DIAGNOSIS — Z1231 Encounter for screening mammogram for malignant neoplasm of breast: Secondary | ICD-10-CM

## 2013-08-10 LAB — CYTOLOGY - PAP

## 2013-11-14 DIAGNOSIS — G932 Benign intracranial hypertension: Secondary | ICD-10-CM | POA: Insufficient documentation

## 2014-08-08 ENCOUNTER — Other Ambulatory Visit: Payer: Self-pay | Admitting: Family Medicine

## 2014-08-08 DIAGNOSIS — N926 Irregular menstruation, unspecified: Secondary | ICD-10-CM

## 2014-08-17 ENCOUNTER — Encounter: Payer: Worker's Compensation | Attending: Family Medicine | Admitting: Dietician

## 2014-08-17 ENCOUNTER — Encounter: Payer: Self-pay | Admitting: Dietician

## 2014-08-17 DIAGNOSIS — Z713 Dietary counseling and surveillance: Secondary | ICD-10-CM | POA: Insufficient documentation

## 2014-08-17 DIAGNOSIS — Z6838 Body mass index (BMI) 38.0-38.9, adult: Secondary | ICD-10-CM | POA: Insufficient documentation

## 2014-08-17 NOTE — Progress Notes (Signed)
  Medical Nutrition Therapy:  Appt start time: 325 end time:  415   Assessment:  Primary concerns today: Janice Lucas is here today with her wife to discuss her elevated cholesterol and HgbA1c (5.8%). She reports that she has lost 25-30 pounds in the past year and a half. Janice Lucas and her wife do the grocery shopping and her wife does the cooking. They cook with olive oil and try to eat plenty of vegetables. Janice Lucas works in a [employer] and states that she is pretty active at work.   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   Nutritional Diagnosis:  Janice Lucas-2.2 Altered nutrition-related laboratory As related to history of inappropriate food choices and excessive energy intake.  As evidenced by elevated LDL and HgbA1c.    Intervention:  Nutrition counseling provided.  Teaching Method Utilized:  Visual Auditory  Handouts given during visit include:  Meal Planning card  MyPlate  Low sodium flavoring tips  Barriers to learning/adherence to lifestyle change: none  Demonstrated degree of understanding via:  Teach Back   Monitoring/Evaluation:  Dietary intake, exercise, and body weight prn.

## 2014-08-17 NOTE — Patient Instructions (Addendum)
-  Get a diet (sugar free) lemonade mix (Wyler's Light)  -Try Smart Balance Butter  -Add a protein food to breakfast (replace one carb with a protein)   -Develop an exercise routine: cardio+weights  -Increase fiber intake: veggies, fresh fruit, and whole grains  -Limit portions of starches -Watch portions of ranch dressing -Be mindful of the food label serving sizes

## 2014-09-01 ENCOUNTER — Other Ambulatory Visit: Payer: Self-pay

## 2014-09-11 ENCOUNTER — Ambulatory Visit
Admission: RE | Admit: 2014-09-11 | Discharge: 2014-09-11 | Disposition: A | Discharge status: Home / Self Care | Payer: BLUE CROSS/BLUE SHIELD | Source: Ambulatory Visit | Attending: Family Medicine | Admitting: Family Medicine

## 2014-09-11 ENCOUNTER — Ambulatory Visit
Admission: RE | Admit: 2014-09-11 | Discharge: 2014-09-11 | Disposition: A | Discharge status: Home / Self Care | Payer: Worker's Compensation | Source: Ambulatory Visit | Attending: Family Medicine | Admitting: Family Medicine

## 2014-09-11 DIAGNOSIS — N926 Irregular menstruation, unspecified: Secondary | ICD-10-CM

## 2015-03-14 ENCOUNTER — Ambulatory Visit: Payer: Self-pay

## 2015-03-14 ENCOUNTER — Other Ambulatory Visit: Payer: Self-pay | Admitting: Occupational Medicine

## 2015-03-14 DIAGNOSIS — R52 Pain, unspecified: Secondary | ICD-10-CM

## 2015-08-30 DIAGNOSIS — G4730 Sleep apnea, unspecified: Secondary | ICD-10-CM | POA: Insufficient documentation

## 2015-08-30 DIAGNOSIS — G43909 Migraine, unspecified, not intractable, without status migrainosus: Secondary | ICD-10-CM | POA: Insufficient documentation

## 2015-10-10 DIAGNOSIS — E782 Mixed hyperlipidemia: Secondary | ICD-10-CM | POA: Insufficient documentation

## 2015-10-10 DIAGNOSIS — R32 Unspecified urinary incontinence: Secondary | ICD-10-CM | POA: Insufficient documentation

## 2015-10-10 DIAGNOSIS — F32A Depression, unspecified: Secondary | ICD-10-CM | POA: Insufficient documentation

## 2015-10-10 DIAGNOSIS — R7303 Prediabetes: Secondary | ICD-10-CM | POA: Insufficient documentation

## 2015-10-10 DIAGNOSIS — F329 Major depressive disorder, single episode, unspecified: Secondary | ICD-10-CM | POA: Insufficient documentation

## 2015-10-10 DIAGNOSIS — Y33XXXA Other specified events, undetermined intent, initial encounter: Secondary | ICD-10-CM | POA: Insufficient documentation

## 2016-02-06 ENCOUNTER — Encounter: Payer: Self-pay | Admitting: Family Medicine

## 2016-02-06 ENCOUNTER — Ambulatory Visit (INDEPENDENT_AMBULATORY_CARE_PROVIDER_SITE_OTHER): Payer: BLUE CROSS/BLUE SHIELD | Admitting: Family Medicine

## 2016-02-06 VITALS — BP 113/80 | HR 91 | Temp 98.1°F | Ht 67.0 in | Wt 274.6 lb

## 2016-02-06 DIAGNOSIS — K219 Gastro-esophageal reflux disease without esophagitis: Secondary | ICD-10-CM

## 2016-02-06 DIAGNOSIS — Z23 Encounter for immunization: Secondary | ICD-10-CM | POA: Diagnosis not present

## 2016-02-06 DIAGNOSIS — Z13220 Encounter for screening for lipoid disorders: Secondary | ICD-10-CM | POA: Diagnosis not present

## 2016-02-06 DIAGNOSIS — Z1329 Encounter for screening for other suspected endocrine disorder: Secondary | ICD-10-CM

## 2016-02-06 DIAGNOSIS — G8929 Other chronic pain: Secondary | ICD-10-CM

## 2016-02-06 DIAGNOSIS — G932 Benign intracranial hypertension: Secondary | ICD-10-CM | POA: Diagnosis not present

## 2016-02-06 DIAGNOSIS — Z131 Encounter for screening for diabetes mellitus: Secondary | ICD-10-CM

## 2016-02-06 DIAGNOSIS — Z130 Encounter for screening for diseases of the blood and blood-forming organs and certain disorders involving the immune mechanism: Secondary | ICD-10-CM | POA: Diagnosis not present

## 2016-02-06 DIAGNOSIS — R51 Headache: Secondary | ICD-10-CM | POA: Diagnosis not present

## 2016-02-06 DIAGNOSIS — Z1211 Encounter for screening for malignant neoplasm of colon: Secondary | ICD-10-CM

## 2016-02-06 LAB — LIPID PANEL
Cholesterol: 191 mg/dL (ref 0–200)
HDL: 50.9 mg/dL (ref >39.00)
LDL Cholesterol: 127 mg/dL — ABNORMAL HIGH (ref 0–99)
NonHDL: 140.13
TRIGLYCERIDES: 67 mg/dL (ref 0.0–149.0)
Total CHOL/HDL Ratio: 4
VLDL: 13.4 mg/dL (ref 0.0–40.0)

## 2016-02-06 LAB — COMPREHENSIVE METABOLIC PANEL
ALK PHOS: 57 U/L (ref 39–117)
ALT: 21 U/L (ref 0–35)
AST: 18 U/L (ref 0–37)
Albumin: 4.1 g/dL (ref 3.5–5.2)
BILIRUBIN TOTAL: 0.3 mg/dL (ref 0.2–1.2)
BUN: 10 mg/dL (ref 6–23)
CALCIUM: 9.1 mg/dL (ref 8.4–10.5)
CO2: 28 mEq/L (ref 19–32)
Chloride: 105 mEq/L (ref 96–112)
Creatinine, Ser: 0.83 mg/dL (ref 0.40–1.20)
GFR: 77.25 mL/min (ref >60.00)
Glucose, Bld: 75 mg/dL (ref 70–99)
Potassium: 3.7 mEq/L (ref 3.5–5.1)
Sodium: 139 mEq/L (ref 135–145)
TOTAL PROTEIN: 7.5 g/dL (ref 6.0–8.3)

## 2016-02-06 LAB — CBC
HEMATOCRIT: 39.3 % (ref 36.0–46.0)
HEMOGLOBIN: 13.3 g/dL (ref 12.0–15.0)
MCHC: 33.8 g/dL (ref 30.0–36.0)
MCV: 86.5 fl (ref 78.0–100.0)
PLATELETS: 336 10*3/uL (ref 150.0–400.0)
RBC: 4.54 Mil/uL (ref 3.87–5.11)
RDW: 13.7 % (ref 11.5–15.5)
WBC: 7.2 10*3/uL (ref 4.0–10.5)

## 2016-02-06 LAB — TSH: TSH: 1.09 u[IU]/mL (ref 0.35–4.50)

## 2016-02-06 LAB — HEMOGLOBIN A1C: Hgb A1c MFr Bld: 6 % (ref 4.6–6.5)

## 2016-02-06 MED ORDER — TOPIRAMATE 100 MG PO TABS: 100.0000 mg | ORAL_TABLET | Freq: Every day | ORAL | 3 refills | Status: DC

## 2016-02-06 MED ORDER — OMEPRAZOLE 20 MG PO CPDR: 20.0000 mg | DELAYED_RELEASE_CAPSULE | Freq: Every day | ORAL | 3 refills | Status: DC

## 2016-02-06 MED ORDER — ACETAZOLAMIDE ER 500 MG PO CP12: 1000.0000 mg | ORAL_CAPSULE | Freq: Two times a day (BID) | ORAL | 3 refills | Status: DC

## 2016-02-06 NOTE — Patient Instructions (Signed)
It was a pleasure to meet you today- please go to the lab for a blood draw and I will be in touch with your results asap Start back on the diamox 1 pill twice a day- after a week or so you can increase to 2 pills twice a day You should get a kit in the mail from Cologuard soon to do your colon cancer screening at home We will get you referred to see a neurologist- I have requested a female provider for you

## 2016-02-06 NOTE — Progress Notes (Signed)
[place] at [facility] [address] [place], [place] [address] [phone]  Date:  02/06/2016   Name:  Janice Lucas   DOB:  25-Jun-1965   MRN:  [id number]  PCP:  No primary care provider on file.    Chief Complaint: Headache (c/o headache that comes and goes x 2 yrs. 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 Using Tylenol arthritis stregnth to help with headaches. Former pt of Dr. Ermalene Postin with [facility]. Would like referral to neurology preferrably a female doctor. Will need refill on AcetaZOLAMIDE, Omeprazole, Topiramate. Will get flu vaccine today.)   History of Present Illness:  OMARI Lucas is a 51 y.o. very pleasant female patient who presents with the following:  Has new patient appt on 02-21-2016, requested to come in sooner. Per her most recent neurology note from October-  Reviewed recent note from her neurologist at [facility], Dr. Ermalene Postin Impression: 51 year old female was diagnosed with intracranial hypertension several years ago lumbar puncture by Dr. Marijean Bravo showed a pressure of 25 and had on optic nerve photography evidence of mild papilledema since 2015 and the last images around March of this year there is been no evidence of papilledema she occasionally has a headache but nothing chronic nothing different than any other time we have discussed and agreed upon decreasing some of her medications the easiest one to do is to decrease the topiramate to 100 mg a day for the next 30 days or so and then stop altogether I implied and made it very clear to her if her symptoms increase or worsen such as visual problems she needs to call us back and we will restart her medication. My exam today shows no evidence of papilledema either. She has an appointment with Dr. Ernestine Conrad and we plan to see her shortly afterwards. If she continues to do well we may decrease 1 of the other medications such as the Diamox in the future. ?  Plan: I gave her a sample of  topiramate generic to take for 30 days then will stop. She will follow-up with her ophthalmologist and she will call us immediately if there is any change in vision.   Headaches for about 2 years.   Taking Topamax 1 time daily. Out of Diamox.  Pertinent surgical history: 02-14-2011, Anterior cervical discectomy C6-C7 arthrodesis with structural allograft Alphatec plate fixation, by Dr. Ellene Route.  Was discharged by Dr. Ermalene Postin @ [facility] 3 weeks ago.  Would like to have a referral to a female neurologist @ [facility]. Headaches are usually 5-6 out of 10.  Headaches last all day if she wakes up with it.  Bending over sometimes makes the headache worse.  Headache is a sore feeling all over her head.  If she does not wake up with a headache & takes her medication, she will still get a headache.  Will occasionally take a couple Tylenol Arthritis on top of prescribed meds, to help her go to sleep at night.  Per patient 1st neurologist Dr. Baltazar Najjar, thought electrocution and related discectomy could be related to headaches.  Dr. Ermalene Postin did not think that there was any relation.  Initial referral to Neuro came her eye doctor, Dr Elayne Guerin.  Next eye appointment is in April 2018.  She ran out of diamox 2-3 weeks ago; her headaches have gotten worse since she ran out.  She would like to get back on this med now.  She is taking her topamax once a day.  She is fasting today except for a banana and coffee.  She would like to do cologuard testing- no other colon cancer screening done as of yet.    Filed Weights   02/06/16 0904  Weight: 274 lb 9.6 oz (124.6 kg)    Patient Active Problem List   Diagnosis Date Noted  . Herniated nucleus pulposus, C6-7 right 02/14/2011    Past Medical History:  Diagnosis Date  . Hypertension   . Sleep apnea     Past Surgical History:  Procedure Laterality Date  . ANTERIOR CERVICAL DECOMP/DISCECTOMY FUSION  02/14/2011   Procedure: ANTERIOR CERVICAL DECOMPRESSION/DISCECTOMY  FUSION 1 LEVEL;  Surgeon: Earleen Newport, MD;  Location: [facility];  Service: Neurosurgery;  Laterality: N/A;  Anterior Cervical Six-Seven Decompression and Fusion    Social History  Substance Use Topics  . Smoking status: Current Every Day Smoker    Packs/day: 0.50    Types: Cigarettes  . Smokeless tobacco: Not on file  . Alcohol use Yes     Comment: occassionally    Family History  Problem Relation Age of Onset  . Diabetes Maternal Aunt   . Diabetes Maternal Uncle     Allergies  Allergen Reactions  . Sulfa Antibiotics Other (See Comments)    Acts "goofy"    Medication list has been reviewed and updated.  Current Outpatient Prescriptions on File Prior to Visit  Medication Sig Dispense Refill  . Cholecalciferol (VITAMIN D PO) Take 1 capsule by mouth 2 (two) times daily.    Marland Kitchen lisinopril-hydrochlorothiazide (PRINZIDE,ZESTORETIC) 20-25 MG per tablet Take 1 tablet by mouth daily.    . nortriptyline (PAMELOR) 25 MG capsule Take 25 mg by mouth at bedtime.     No current facility-administered medications on file prior to visit.     Review of Systems:  Review of Systems  Constitutional: Negative for chills, fever and weight loss.  HENT: Negative for congestion, ear discharge, ear pain, hearing loss, sore throat and tinnitus.   Eyes: Negative for blurred vision, double vision and pain.  Respiratory: Negative for cough and wheezing.   Cardiovascular: Negative for chest pain, palpitations and leg swelling.  Gastrointestinal: Positive for heartburn.  Musculoskeletal: Negative for myalgias and neck pain.  Neurological: Positive for dizziness and headaches. Negative for tingling, tremors, sensory change, speech change, loss of consciousness and weakness.     Physical Examination: Blood pressure 113/80, pulse 91, temperature 98.1 F (36.7 C), temperature source Oral, height _0  (1.702 m), weight 274 lb 9.6 oz (124.6 kg), SpO2 96 %. Body mass index is 43.01 kg/m.  GEN:  WDWN, NAD, Non-toxic, A & O x 3, overweight, looks well HEENT: Atraumatic, Normocephalic. Neck supple. No masses, No LAD.  Bilateral TM WNL, oropharynx normal.  PEERL,EOMI.   Ears and Nose: No external deformity. CV: RRR, No M/G/R. No JVD. No thrill. No extra heart sounds. PULM: CTA B, no wheezes, crackles, rhonchi. No retractions. No resp. distress. No accessory muscle use. ABD: S, NT, ND. No rebound. No HSM. EXTR: No c/c/e NEURO Normal gait. Normal movement of all extremities and normal facial movement PSYCH: Normally interactive. Conversant. Not depressed or anxious appearing.  Calm demeanor.  Assessment and Plan:  Pseudotumor cerebri - Plan: acetaZOLAMIDE (DIAMOX) 500 MG capsule, topiramate (TOPAMAX) 100 MG tablet, Ambulatory referral to Neurology  Need for influenza vaccination - Plan: Flu Vaccine QUAD 36+ mos IM (Fluarix & Fluzone Quad PF  Chronic nonintractable headache, unspecified headache type - Plan: Ambulatory referral to Neurology  Screening  for hyperlipidemia - Plan: Lipid panel  Screening for thyroid disorder - Plan: TSH  Screening for diabetes mellitus - Plan: Comprehensive metabolic panel, Hemoglobin A1c  Screening for deficiency anemia - Plan: CBC  Gastroesophageal reflux disease, esophagitis presence not specified - Plan: omeprazole (PRILOSEC) 20 MG capsule  Screening for colon cancer    Meds ordered this encounter  Medications  . acetaZOLAMIDE (DIAMOX) 500 MG capsule    Sig: Take 2 capsules (1,000 mg total) by mouth 2 (two) times daily.    Dispense:  360 capsule    Refill:  3  . omeprazole (PRILOSEC) 20 MG capsule    Sig: Take 1 capsule (20 mg total) by mouth daily.    Dispense:  90 capsule    Refill:  3  . topiramate (TOPAMAX) 100 MG tablet    Sig: Take 1 tablet (100 mg total) by mouth daily.    Dispense:  90 tablet    Refill:  3  It was a pleasure to meet you today- please go to the lab for a blood draw and I will be in touch with your results  asap Start back on the diamox 1 pill twice a day- after a week or so you can increase to 2 pills twice a day You should get a kit in the mail from [employer] soon to do your colon cancer screening at home We will get you referred to see a neurologist- I have requested a female provider for you Labs today: CBC, CMP, Hgb A1C, Lipid Panel, TSH.  Flu vaccine today.  Referral to Neurology, preferred female provider Press photographer or [facility]).  Follow-up pending labs  Results for orders placed or performed in visit on 02/06/16  CBC  Result Value Ref Range   WBC 7.2 4.0 - 10.5 K/uL   RBC 4.54 3.87 - 5.11 Mil/uL   Platelets 336.0 150.0 - 400.0 K/uL   Hemoglobin 13.3 12.0 - 15.0 g/dL   HCT 39.3 36.0 - 46.0 %   MCV 86.5 78.0 - 100.0 fl   MCHC 33.8 30.0 - 36.0 g/dL   RDW 13.7 11.5 - 15.5 %  Comprehensive metabolic panel  Result Value Ref Range   Sodium 139 135 - 145 mEq/L   Potassium 3.7 3.5 - 5.1 mEq/L   Chloride 105 96 - 112 mEq/L   CO2 28 19 - 32 mEq/L   Glucose, Bld 75 70 - 99 mg/dL   BUN 10 6 - 23 mg/dL   Creatinine, Ser 0.83 0.40 - 1.20 mg/dL   Total Bilirubin 0.3 0.2 - 1.2 mg/dL   Alkaline Phosphatase 57 39 - 117 U/L   AST 18 0 - 37 U/L   ALT 21 0 - 35 U/L   Total Protein 7.5 6.0 - 8.3 g/dL   Albumin 4.1 3.5 - 5.2 g/dL   Calcium 9.1 8.4 - 10.5 mg/dL   GFR 77.25 >60.00 mL/min  Lipid panel  Result Value Ref Range   Cholesterol 191 0 - 200 mg/dL   Triglycerides 67.0 0.0 - 149.0 mg/dL   HDL 50.90 >39.00 mg/dL   VLDL 13.4 0.0 - 40.0 mg/dL   LDL Cholesterol 127 (H) 0 - 99 mg/dL   Total CHOL/HDL Ratio 4    NonHDL 140.13   TSH  Result Value Ref Range   TSH 1.09 0.35 - 4.50 uIU/mL  Hemoglobin A1c  Result Value Ref Range   Hgb A1c MFr Bld 6.0 4.6 - 6.5 %     Signed Jillyn Ledger, FNP-S  I  have reviewed the chart and examined patient, went over assessment and plan pt and her partner

## 2016-02-08 ENCOUNTER — Ambulatory Visit (INDEPENDENT_AMBULATORY_CARE_PROVIDER_SITE_OTHER): Payer: BLUE CROSS/BLUE SHIELD | Admitting: Neurology

## 2016-02-08 ENCOUNTER — Encounter: Payer: Self-pay | Admitting: Neurology

## 2016-02-08 VITALS — BP 118/72 | HR 88 | Ht 67.0 in | Wt 271.1 lb

## 2016-02-08 DIAGNOSIS — G43009 Migraine without aura, not intractable, without status migrainosus: Secondary | ICD-10-CM

## 2016-02-08 DIAGNOSIS — G932 Benign intracranial hypertension: Secondary | ICD-10-CM | POA: Diagnosis not present

## 2016-02-08 MED ORDER — TOPIRAMATE 100 MG PO TABS: 100.0000 mg | ORAL_TABLET | Freq: Every day | ORAL | 3 refills | Status: DC

## 2016-02-08 MED ORDER — NORTRIPTYLINE HCL 25 MG PO CAPS: 25.0000 mg | ORAL_CAPSULE | Freq: Every day | ORAL | 3 refills | Status: DC

## 2016-02-08 MED ORDER — TOPIRAMATE 100 MG PO TABS: 100.0000 mg | ORAL_TABLET | Freq: Two times a day (BID) | ORAL | 3 refills | Status: DC

## 2016-02-08 NOTE — Progress Notes (Signed)
NEUROLOGY CONSULTATION NOTE  Janice Lucas MRN: [id number] DOB: 1965-03-29  Referring provider: Dr. Lamar Blinks Primary care provider: Dr. Lamar Blinks  Reason for consult:  Headaches, pseudotumor  Dear Dr Lorelei Pont:  Thank you for your kind referral of Janice Lucas for consultation of the above symptoms. Although her history is well known to you, please allow me to reiterate it for the purpose of our medical record. The patient was accompanied to the clinic by her wife who also provides collateral information. Records and images were personally reviewed where available.  HISTORY OF PRESENT ILLNESS: [name] with a history of pseudotumor cerebri diagnosed in 2015. Records from [facility] were reviewed, she presented to her eye doctor for a routine exam in April 2015 with a 43-month history of headaches thinking she may need new glasses. She was noted to have mild papilledema, right > left. At that time, headaches were behind her eyes and at the vertex, daily, worsened by bending, lifting, coughing, and sneezing. MRI and MRV brain were reported as normal. She had a lumbar puncture with opening pressure of 25 in the lateral position. They report problems with her LP, she had a post-spinal tap headache that initial blood patch did not help with. She was started on Topamax and Diamox, and nortriptyline was added for migraine prevention. She was doing very well on this regimen with rare headaches. Her neurologist Dr. Baltazar Najjar left the practice and she saw Dr. Ermalene Postin last October, with note of improvement in eye exam from last visit, no evidence of papilledema, and rare headaches. They discussed reducing some of her medications, Topamax was reduced to 100mg  daily. With reduction in Topamax, she noticed an increase in headaches, particularly in the morning. She then lost her insurance and was unable to obtain the Diamox. Without this, headaches  became much worse. She describes them as diffuse sharp pain, with occasional photo and phonophobia when severe, no nausea/vomiting. Bending over causes floaters in her vision. She has intermittent tinnitus (non-pulsatile). She feels her vision is getting worse, she is straining more to use the computer with blurry vision, no loss of vision. No driving difficulties with peripheral vision.   She has been back on the Diamox after seeing her PCP, currently on uptitration taking 500mg  BID, then increasing back to original dose of 1000mg  BID. She had some paresthesias on the Diamox, and found that bananas help with this. She denies any dysarthria/dysphagia, neck/back pain, bowel/bladder dysfunction, dizziness, no falls. She reports some numbness in the three fingers of her right hand, they feel cold suddenly ("like ice cubes") and hurt. 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   PAST MEDICAL HISTORY: Past Medical History:  Diagnosis Date  . Hypertension   . Sleep apnea     PAST SURGICAL HISTORY: Past Surgical History:  Procedure Laterality Date  . ANTERIOR CERVICAL DECOMP/DISCECTOMY FUSION  02/14/2011   Procedure: ANTERIOR CERVICAL DECOMPRESSION/DISCECTOMY FUSION 1 LEVEL;  Surgeon: Earleen Newport, MD;  Location: [facility];  Service: Neurosurgery;  Laterality: N/A;  Anterior Cervical Six-Seven Decompression and Fusion    MEDICATIONS: Current Outpatient Prescriptions on File Prior to Visit  Medication Sig Dispense Refill  . acetaZOLAMIDE (DIAMOX) 500 MG capsule Take 2 capsules (1,000 mg total) by mouth 2 (two) times daily. 360 capsule 3  . Cholecalciferol (VITAMIN D PO) Take 1 capsule by mouth 2 (two) times daily.    Marland Kitchen lisinopril-hydrochlorothiazide (  PRINZIDE,ZESTORETIC) 20-25 MG per tablet Take 1 tablet by mouth daily.    . nortriptyline (PAMELOR) 25 MG capsule Take 25 mg by mouth at bedtime.    Marland Kitchen omeprazole (PRILOSEC) 20 MG capsule Take 1 capsule (20 mg  total) by mouth daily. 90 capsule 3  . topiramate (TOPAMAX) 100 MG tablet Take 1 tablet (100 mg total) by mouth daily. 90 tablet 3   No current facility-administered medications on file prior to visit.     ALLERGIES: Allergies  Allergen Reactions  . Sulfa Antibiotics Other (See Comments)    Acts "goofy"    FAMILY HISTORY: Family History  Problem Relation Age of Onset  . Diabetes Maternal Aunt   . Diabetes Maternal Uncle     SOCIAL HISTORY: Social History   Social History  . Marital status: Single    Spouse name: N/A  . Number of children: N/A  . Years of education: N/A   Occupational History  . Not on file.   Social History Main Topics  . Smoking status: Current Every Day Smoker    Packs/day: 0.50    Types: Cigarettes  . Smokeless tobacco: Not on file  . Alcohol use Yes     Comment: occassionally  . Drug use: Unknown  . Sexual activity: Not on file   Other Topics Concern  . Not on file   Social History Narrative  . No narrative on file    REVIEW OF SYSTEMS: Constitutional: No fevers, chills, or sweats, no generalized fatigue, change in appetite Eyes: No visual changes, double vision, eye pain Ear, nose and throat: No hearing loss, ear pain, nasal congestion, sore throat Cardiovascular: No chest pain, palpitations Respiratory:  No shortness of breath at rest or with exertion, wheezes GastrointestinaI: No nausea, vomiting, diarrhea, abdominal pain, fecal incontinence Genitourinary:  No dysuria, urinary retention or frequency Musculoskeletal:  No neck pain, back pain Integumentary: No rash, pruritus, skin lesions Neurological: as above Psychiatric: No depression, insomnia, anxiety Endocrine: No palpitations, fatigue, diaphoresis, mood swings, change in appetite, change in weight, increased thirst Hematologic/Lymphatic:  No anemia, purpura, petechiae. Allergic/Immunologic: no itchy/runny eyes, nasal congestion, recent allergic reactions, rashes  PHYSICAL  EXAM: Vitals:   02/08/16 0856  BP: 118/72  Pulse: 88   General: No acute distress Head:  Normocephalic/atraumatic Eyes: Fundoscopic exam shows bilateral sharp discs, no vessel changes, exudates, or hemorrhages Neck: supple, no paraspinal tenderness, full range of motion Back: No paraspinal tenderness Heart: regular rate and rhythm Lungs: Clear to auscultation bilaterally. Vascular: No carotid bruits. Skin/Extremities: No rash, no edema Neurological Exam: Mental status: alert and oriented to person, place, and time, no dysarthria or aphasia, Fund of knowledge is appropriate.  Recent and remote memory are intact.  Attention and concentration are normal.    Able to name objects and repeat phrases. Cranial nerves: CN I: not tested CN II: pupils equal, round and reactive to light, visual fields intact, fundi unremarkable with sharp discs and venous pulsations noted. CN III, IV, VI:  full range of motion, no nystagmus, no ptosis CN V: facial sensation intact CN VII: upper and lower face symmetric CN VIII: hearing intact to finger rub CN IX, X: gag intact, uvula midline CN XI: sternocleidomastoid and trapezius muscles intact CN XII: tongue midline Bulk & Tone: normal, no fasciculations. Motor: 5/5 throughout with no pronator drift. Sensation: intact to light touch, cold, pin, vibration and joint position sense.  No extinction to double simultaneous stimulation.  Romberg test negative Deep Tendon Reflexes: +2 throughout, no  ankle clonus Plantar responses: downgoing bilaterally Cerebellar: no incoordination on finger to nose, heel to shin. No dysdiadochokinesia Gait: narrow-based and steady, able to tandem walk adequately. Tremor: none  IMPRESSION: [name] with a history of pseudotumor cerebri diagnosed in 2015. Headaches were well-controlled on Diamox 1000mg  BID, Topamax 100mg , and she was also started on nortriptyline 25mg  qhs for migraine  prophylaxis. She had some insurance issues and was unable to obtain Diamox, with increase in headaches. She is back on track and feels much better. Refills for her prescription were sent. Her neurological exam is normal, no papilledema on exam today, she has a follow-up with her eye doctor Dr. Len Childs in a few months. We discussed how weight loss also helps with her condition. She will follow-up in 6 months and knows to call for any changes.  Thank you for allowing me to participate in the care of this patient. Please do not hesitate to call for any questions or concerns.   Ellouise Newer, M.D.  CC: Dr. Lorelei Pont

## 2016-02-08 NOTE — Patient Instructions (Addendum)
1. Continue Diamox 500mg , increase as instructed to 2 tablets twice a day 2. Resume Topamax 100mg  twice daily 3. Continue nortriptyline 25mg  once a day 4. Continue with weight loss and exercise 5. Proceed with eye doctor visit, pls have Dr. Len Childs send notes to our office 6. Follow-up in 6 months, call for any changes

## 2016-02-21 ENCOUNTER — Ambulatory Visit: Payer: Self-pay | Admitting: Family Medicine

## 2016-03-05 ENCOUNTER — Ambulatory Visit (INDEPENDENT_AMBULATORY_CARE_PROVIDER_SITE_OTHER): Payer: BLUE CROSS/BLUE SHIELD | Admitting: Medical

## 2016-03-05 ENCOUNTER — Ambulatory Visit (HOSPITAL_BASED_OUTPATIENT_CLINIC_OR_DEPARTMENT_OTHER)
Admission: RE | Admit: 2016-03-05 | Discharge: 2016-03-05 | Disposition: A | Discharge status: Home / Self Care | Payer: BLUE CROSS/BLUE SHIELD | Source: Ambulatory Visit | Attending: Medical | Admitting: Medical

## 2016-03-05 ENCOUNTER — Encounter: Payer: Self-pay | Admitting: Medical

## 2016-03-05 VITALS — BP 105/65 | HR 76 | Temp 98.2°F | Resp 16 | Ht 67.0 in | Wt 266.1 lb

## 2016-03-05 DIAGNOSIS — M79641 Pain in right hand: Secondary | ICD-10-CM

## 2016-03-05 LAB — SEDIMENTATION RATE: SED RATE: 9 mm/h (ref 0–30)

## 2016-03-05 LAB — C-REACTIVE PROTEIN: CRP: 0.3 mg/dL — ABNORMAL LOW (ref 0.5–20.0)

## 2016-03-05 LAB — URIC ACID: Uric Acid, Serum: 6.7 mg/dL (ref 2.4–7.0)

## 2016-03-05 MED ORDER — DICLOFENAC SODIUM 75 MG PO TBEC: 75.0000 mg | DELAYED_RELEASE_TABLET | Freq: Two times a day (BID) | ORAL | 0 refills | Status: DC

## 2016-03-05 NOTE — Progress Notes (Signed)
Subjective:    Patient ID: Janice Lucas, female    DOB: 21-Nov-1965, 51 y.o.   MRN: [id number]  HPI  Pt in with some rt hand pain. Points to rt 2nd mcp and 3rd mcp area. Pt is right handed. Pt works in [employer]. Some manual labor and repetitive use. No injury that she can remember. No medication for pain. Notices pain more at night. Level pain at end of day and at night 5-6/10. No other joint pains.  Pt had remote injury to rt hand got electrectric shock injury 4-5 years ago.    Review of Systems  Constitutional: Negative for chills, fatigue and fever.  Respiratory: Negative for cough, chest tightness and wheezing.   Cardiovascular: Negative for chest pain and palpitations.  Musculoskeletal:       Rt hand pain.  Psychiatric/Behavioral: Negative for behavioral problems and confusion.     Past Medical History:  Diagnosis Date  . Hypertension   . Sleep apnea      Social History   Social History  . Marital status: Single    Spouse name: N/A  . Number of children: N/A  . Years of education: N/A   Occupational History  . Not on file.   Social History Main Topics  . Smoking status: Former Smoker    Packs/day: 0.50    Types: Cigarettes  . Smokeless tobacco: Never Used  . Alcohol use No     Comment: occassionally  . Drug use: Yes  . Sexual activity: Not on file   Other Topics Concern  . Not on file   Social History Narrative  . No narrative on file    Past Surgical History:  Procedure Laterality Date  . ANTERIOR CERVICAL DECOMP/DISCECTOMY FUSION  02/14/2011   Procedure: ANTERIOR CERVICAL DECOMPRESSION/DISCECTOMY FUSION 1 LEVEL;  Surgeon: Earleen Newport, MD;  Location: [place] [facility];  Service: Neurosurgery;  Laterality: N/A;  Anterior Cervical Six-Seven Decompression and Fusion    Family History  Problem Relation Age of Onset  . Diabetes Maternal Aunt   . Diabetes Maternal Uncle     Allergies  Allergen Reactions  . Sulfa Antibiotics Other (See Comments)      Acts "goofy"    Current Outpatient Prescriptions on File Prior to Visit  Medication Sig Dispense Refill  . acetaZOLAMIDE (DIAMOX) 500 MG capsule Take 2 capsules (1,000 mg total) by mouth 2 (two) times daily. 360 capsule 3  . lisinopril-hydrochlorothiazide (PRINZIDE,ZESTORETIC) 20-25 MG per tablet Take 1 tablet by mouth daily.    . nortriptyline (PAMELOR) 25 MG capsule Take 1 capsule (25 mg total) by mouth at bedtime. 90 capsule 3  . omeprazole (PRILOSEC) 20 MG capsule Take 1 capsule (20 mg total) by mouth daily. 90 capsule 3  . topiramate (TOPAMAX) 100 MG tablet Take 1 tablet (100 mg total) by mouth 2 (two) times daily. 180 tablet 3   No current facility-administered medications on file prior to visit.     BP 105/65 (BP Location: Right Arm, Patient Position: Sitting, Cuff Size: Large)   Pulse 76   Temp 98.2 F (36.8 C) (Oral)   Resp 16   Ht 5\' 7"  (1.702 m)   Wt 266 lb 2 oz (120.7 kg)   SpO2 100%   BMI 41.68 kg/m       Objective:   Physical Exam   General- No acute distress. Pleasant patient. Lungs- Clear, even and unlabored. Heart- regular rate and rhythm. Neurologic- CNII- XII grossly intact.  Rt hand- no obvious  swelling. Pain on palpation between 2nd and 3rd digit. Some pain on flexion and extension.    Assessment & Plan:  For your rt hand pain will get xray of your hand and get  inflammatory studies.   Will rx diclofenac for pain and inflammation.  Follow up in 2-3 or as needed

## 2016-03-05 NOTE — Progress Notes (Signed)
Pre visit review using our clinic review tool, if applicable. No additional management support is needed unless otherwise documented below in the visit note/SLS  

## 2016-03-05 NOTE — Patient Instructions (Signed)
For your rt hand pain will get xray of your hand and get  inflammatory studies.   Will rx diclofenac for pain and inflammation.  Follow up in 2-3 or as needed

## 2016-03-06 LAB — ANA: ANA: NEGATIVE

## 2016-03-06 LAB — RHEUMATOID FACTOR

## 2016-03-10 ENCOUNTER — Telehealth: Payer: Self-pay | Admitting: Family Medicine

## 2016-03-10 NOTE — Telephone Encounter (Signed)
Caller name: Relationship to patient: Self Can be reached: [phone] Pharmacy:  Reason for call: Request lab results

## 2016-03-11 ENCOUNTER — Telehealth: Payer: Self-pay | Admitting: Medical

## 2016-03-11 NOTE — Telephone Encounter (Signed)
Is pt scheduled to see Dr. Lorelei Pont?

## 2016-03-11 NOTE — Telephone Encounter (Signed)
Returned pt's call regarding lab results. Reviewed labs with pt. Pt states hand pain is still present and is about the same. Informed pt she could schedule a f/u appointment with her PCP Dr. Lorelei Pont to discuss other treatment options. Pt would like to call back to schedule appt.

## 2016-03-12 NOTE — Telephone Encounter (Signed)
Not yet, I tried to schedule appt for pt. Pt said she would call back to schedule follow up with Dr. Lorelei Pont.

## 2016-03-17 ENCOUNTER — Telehealth: Payer: Self-pay | Admitting: Neurology

## 2016-03-17 NOTE — Telephone Encounter (Signed)
Received Eye doctor notes from Dr. Len Childs visit 03/10/16: No optic nerve swelling seen.

## 2016-06-11 ENCOUNTER — Telehealth: Payer: Self-pay | Admitting: Neurology

## 2016-06-11 NOTE — Telephone Encounter (Signed)
Patient wife [name] called and states that patient hit her head in the building on Saturday. Patient is still stating that her heading is hurting and [name] would like to talk to someone please call her at [phone]

## 2016-06-11 NOTE — Telephone Encounter (Signed)
Returned call to pt wife, [name].  She states that pt hit her head on a 2x4 this past Saturday and has had a headache since. Wife asked if pt could be seen in office today.  Advised to go to the ER, as they will be able to obtain any imaging needed and we will follow up after.  Wife understands.

## 2016-07-04 ENCOUNTER — Other Ambulatory Visit: Payer: Self-pay

## 2016-07-04 MED ORDER — LISINOPRIL-HYDROCHLOROTHIAZIDE 20-25 MG PO TABS: 1.0000 | ORAL_TABLET | Freq: Every day | ORAL | 0 refills | Status: DC

## 2016-07-13 NOTE — Progress Notes (Addendum)
[facility] [address] [place], [place] [address] [phone]  Date:  07/14/2016   Name:  Janice Lucas   DOB:  01/27/1965   MRN:  [id number]  PCP:  Darreld Mclean, MD    Chief Complaint: Follow-up   History of Present Illness:  Janice Lucas is a 51 y.o. very pleasant female patient who presents with the following:  Follow-up visit today. I saw her for our first visit together back in January. She has a history of idiopathic intracranial HTN/ pseudotumor cerebri and chronic headaches  Headaches for about 2 years.  Taking Topamax 1 time daily. Out of Diamox. Pertinent surgical history: 02-14-2011, Anterior cervical discectomy C6-C7 arthrodesis with structural allograft Alphatec plate fixation, by Dr. Ellene Route. Was discharged by Dr. Ermalene Postin @ [facility] 3 weeks ago.  Would like to have a referral to a female neurologist @ [place]. Headaches are usually 5-6 out of 10.  Headaches last all day if she wakes up with it.  Bending over sometimes makes the headache worse.  Headache is a sore feeling all over her head.  If she does not wake up with a headache & takes her medication, she will still get a headache.  Will occasionally take a couple Tylenol Arthritis on top of prescribed meds, to help her go to sleep at night. Per patient 1st neurologist Dr. Baltazar Najjar, thought electrocution and related discectomy could be related to headaches.  Dr. Ermalene Postin did not think that there was any relation Initial referral to Neuro came her eye doctor, Dr Elayne Guerin.  Next eye appointment is in April 2018. She ran out of diamox 2-3 weeks ago; her headaches have gotten worse since she ran out.  She would like to get back on this med now. She is taking her topamax once a day.  She is also now established with Dr. Delice Lesch- her most recent note from January:  [name] with a history of pseudotumor cerebri  diagnosed in 2015. Headaches were well-controlled on Diamox 1000mg  BID, Topamax 100mg , and she was also started on nortriptyline 25mg  qhs for migraine prophylaxis. She had some insurance issues and was unable to obtain Diamox, with increase in headaches. She is back on track and feels much better. Refills for her prescription were sent. Her neurological exam is normal, no papilledema on exam today, she has a follow-up with her eye doctor Dr. Len Childs in a few months. We discussed how weight loss also helps with her condition. She will follow-up in 6 months and knows to call for any changes.  Noted elevated A1c at last labs in January:  Lab Results  Component Value Date   HGBA1C 6.0 02/06/2016  encouraged weight loss, and we will plan to repeat her A1c today She is seeing Dr. Delice Lesch later on this month for a follow-up viist  BP Readings from Last 3 Encounters:  07/14/16 109/78  03/05/16 105/65  02/08/16 118/72   Wt Readings from Last 3 Encounters:  07/14/16 271 lb 12.8 oz (123.3 kg)  03/05/16 266 lb 2 oz (120.7 kg)  02/08/16 271 lb 2 oz (123 kg)   She does not check her home BP  She has seen her optho and all is well as far as her eyes She would like to do cologuard testing for colon cancer after discussion of options She has noted bilateral elbow pain off and on for a month  or so- she is not aware of any particular injury however She also has some trouble with her ankles- the right has been sprained several times and is always a bit problematic.  The left has been more sore just recently -she is not aware of any acute sprain or other injury however  She and her wife Janice Lucas recently visited the beach for their anniversary and had a good time  Patient Active Problem List   Diagnosis Date Noted  . Idiopathic intracranial hypertension 02/08/2016  . Migraine without aura and without status migrainosus, not intractable 02/08/2016  . Herniated nucleus pulposus, C6-7 right 02/14/2011    Past  Medical History:  Diagnosis Date  . Hypertension   . Sleep apnea     Past Surgical History:  Procedure Laterality Date  . ANTERIOR CERVICAL DECOMP/DISCECTOMY FUSION  02/14/2011   Procedure: ANTERIOR CERVICAL DECOMPRESSION/DISCECTOMY FUSION 1 LEVEL;  Surgeon: Earleen Newport, MD;  Location: [facility];  Service: Neurosurgery;  Laterality: N/A;  Anterior Cervical Six-Seven Decompression and Fusion    Social History  Substance Use Topics  . Smoking status: Former Smoker    Packs/day: 0.50    Types: Cigarettes  . Smokeless tobacco: Never Used  . Alcohol use No     Comment: occassionally    Family History  Problem Relation Age of Onset  . Diabetes Maternal Aunt   . Diabetes Maternal Uncle     Allergies  Allergen Reactions  . Sulfa Antibiotics Other (See Comments)    Acts "goofy"    Medication list has been reviewed and updated.  Current Outpatient Prescriptions on File Prior to Visit  Medication Sig Dispense Refill  . acetaminophen (TYLENOL) 500 MG tablet Take 500 mg by mouth every 6 (six) hours as needed.    Marland Kitchen acetaZOLAMIDE (DIAMOX) 500 MG capsule Take 2 capsules (1,000 mg total) by mouth 2 (two) times daily. 360 capsule 3  . aspirin EC 81 MG tablet Take 81 mg by mouth daily.    Marland Kitchen lisinopril-hydrochlorothiazide (PRINZIDE,ZESTORETIC) 20-25 MG tablet Take 1 tablet by mouth daily. 30 tablet 0  . nortriptyline (PAMELOR) 25 MG capsule Take 1 capsule (25 mg total) by mouth at bedtime. 90 capsule 3  . omeprazole (PRILOSEC) 20 MG capsule Take 1 capsule (20 mg total) by mouth daily. 90 capsule 3  . topiramate (TOPAMAX) 100 MG tablet Take 1 tablet (100 mg total) by mouth 2 (two) times daily. 180 tablet 3   No current facility-administered medications on file prior to visit.     Review of Systems:  As per HPI- otherwise negative.   Physical Examination: Vitals:   07/14/16 1530  BP: 109/78  Pulse: 93  Temp: 98.3 F (36.8 C)   Vitals:   07/14/16 1530  Weight: 271 lb 12.8  oz (123.3 kg)  Height: 5\' 7"  (1.702 m)   Body mass index is 42.57 kg/m. Ideal Body Weight: Weight in (lb) to have BMI = 25: 159.3  GEN: WDWN, NAD, Non-toxic, A & O x 3, obese, otherwise looks well HEENT: Atraumatic, Normocephalic. Neck supple. No masses, No LAD. Ears and Nose: No external deformity. CV: RRR, No M/G/R. No JVD. No thrill. No extra heart sounds. PULM: CTA B, no wheezes, crackles, rhonchi. No retractions. No resp. distress. No accessory muscle use. ABD: S, NT, ND, +BS. No rebound. No HSM. EXTR: No c/c/e NEURO Normal gait.  PSYCH: Normally interactive. Conversant. Not depressed or anxious appearing.  Calm demeanor.  Elbows: no swelling or heat.  Seems to  have lateral epicondyle tenderness on the right and medial on the left.  She has mild tenderness with both resisted pronation and supination. Normal flexion and extension Her left ankle is not warm or red.  Cannot pinpoint a tender area but she notes that the ankle just feels sore.  No apparent swelling   Assessment and Plan: Pseudotumor cerebri  Medication monitoring encounter - Plan: Basic metabolic panel  Pre-diabetes - Plan: Hemoglobin A1c  Pain of both elbows  Acute left ankle pain  Colon cancer screening  Here today for a follow-up visit BP is well controlled  She will see neurology for follow-up of her pseudotumor.  unfortunately she has not lost weight as of yet Offered referral to sports med for her elbows- she declines for now but will let me know if her elbows do not get better soon Suggested PT for her ankles- she does not wish to do PT now, but will get an OTC lace up ankle brace to use  She will let me know if further follow-up is needed for her MSK concerns  Will plan further follow- up pending labs.   Signed Lamar Blinks, MD  Received her labs 7/3.  Called and left detailed message on machine.  K is a bit low- I am going to rx a supplement for her. Then plan to repeat K next week lab visit  only.  Please call me if any questions Meds ordered this encounter  Medications  . potassium chloride SA (K-DUR,KLOR-CON) 20 MEQ tablet    Sig: Take 1 tablet (20 mEq total) by mouth daily. Take for 5 days.  Then stop pending repeat blood level    Dispense:  30 tablet    Refill:  0     Results for orders placed or performed in visit on 32/20/25  Basic metabolic panel  Result Value Ref Range   Sodium 139 135 - 145 mEq/L   Potassium 3.2 (L) 3.5 - 5.1 mEq/L   Chloride 109 96 - 112 mEq/L   CO2 23 19 - 32 mEq/L   Glucose, Bld 85 70 - 99 mg/dL   BUN 13 6 - 23 mg/dL   Creatinine, Ser 0.93 0.40 - 1.20 mg/dL   Calcium 8.2 (L) 8.4 - 10.5 mg/dL   GFR 67.63 >60.00 mL/min  Hemoglobin A1c  Result Value Ref Range   Hgb A1c MFr Bld 5.9 4.6 - 6.5 %

## 2016-07-14 ENCOUNTER — Encounter: Payer: Self-pay | Admitting: Family Medicine

## 2016-07-14 ENCOUNTER — Ambulatory Visit (INDEPENDENT_AMBULATORY_CARE_PROVIDER_SITE_OTHER): Payer: BLUE CROSS/BLUE SHIELD | Admitting: Family Medicine

## 2016-07-14 VITALS — BP 109/78 | HR 93 | Temp 98.3°F | Ht 67.0 in | Wt 271.8 lb

## 2016-07-14 DIAGNOSIS — G932 Benign intracranial hypertension: Secondary | ICD-10-CM | POA: Diagnosis not present

## 2016-07-14 DIAGNOSIS — R7303 Prediabetes: Secondary | ICD-10-CM

## 2016-07-14 DIAGNOSIS — E876 Hypokalemia: Secondary | ICD-10-CM

## 2016-07-14 DIAGNOSIS — Z1211 Encounter for screening for malignant neoplasm of colon: Secondary | ICD-10-CM | POA: Diagnosis not present

## 2016-07-14 DIAGNOSIS — M25522 Pain in left elbow: Secondary | ICD-10-CM

## 2016-07-14 DIAGNOSIS — M25572 Pain in left ankle and joints of left foot: Secondary | ICD-10-CM

## 2016-07-14 DIAGNOSIS — Z5181 Encounter for therapeutic drug level monitoring: Secondary | ICD-10-CM | POA: Diagnosis not present

## 2016-07-14 DIAGNOSIS — M25521 Pain in right elbow: Secondary | ICD-10-CM

## 2016-07-14 NOTE — Patient Instructions (Addendum)
We will set you up for cologuard testing to screen for colon cancer Let me know if you do want to see sports med about your elbows.  Also, try a lace up ankle support for your left ankle pain  I will be in touch with your labs asap

## 2016-07-15 LAB — HEMOGLOBIN A1C: HEMOGLOBIN A1C: 5.9 % (ref 4.6–6.5)

## 2016-07-15 LAB — BASIC METABOLIC PANEL
BUN: 13 mg/dL (ref 6–23)
CALCIUM: 8.2 mg/dL — AB (ref 8.4–10.5)
CO2: 23 mEq/L (ref 19–32)
CREATININE: 0.93 mg/dL (ref 0.40–1.20)
Chloride: 109 mEq/L (ref 96–112)
GFR: 67.63 mL/min (ref >60.00)
Glucose, Bld: 85 mg/dL (ref 70–99)
Potassium: 3.2 mEq/L — ABNORMAL LOW (ref 3.5–5.1)
Sodium: 139 mEq/L (ref 135–145)

## 2016-07-15 MED ORDER — POTASSIUM CHLORIDE CRYS ER 20 MEQ PO TBCR: 20.0000 meq | EXTENDED_RELEASE_TABLET | Freq: Every day | ORAL | 0 refills | Status: DC

## 2016-07-15 NOTE — Addendum Note (Signed)
Addended by: Lamar Blinks C on: 07/15/2016 01:48 PM   Modules accepted: Orders

## 2016-07-15 NOTE — Addendum Note (Signed)
Addended by: Lamar Blinks C on: 07/15/2016 02:30 PM   Modules accepted: Orders

## 2016-07-21 ENCOUNTER — Telehealth: Payer: Self-pay | Admitting: *Deleted

## 2016-07-21 NOTE — Telephone Encounter (Signed)
Received Cologuard Incomplete Physician Orders from [employer], forwarded to provider/SLS 07/09

## 2016-07-22 ENCOUNTER — Telehealth: Payer: Self-pay | Admitting: *Deleted

## 2016-07-22 NOTE — Telephone Encounter (Signed)
Received Cologuard Incomplete Order: [id number] from [employer], forwarded to provider/SLS 07/10

## 2016-07-25 ENCOUNTER — Telehealth: Payer: Self-pay | Admitting: Family Medicine

## 2016-07-25 ENCOUNTER — Other Ambulatory Visit (INDEPENDENT_AMBULATORY_CARE_PROVIDER_SITE_OTHER): Payer: BLUE CROSS/BLUE SHIELD

## 2016-07-25 DIAGNOSIS — E876 Hypokalemia: Secondary | ICD-10-CM

## 2016-07-25 DIAGNOSIS — M778 Other enthesopathies, not elsewhere classified: Secondary | ICD-10-CM

## 2016-07-25 NOTE — Addendum Note (Signed)
Addended by: Caffie Pinto on: 07/25/2016 04:29 PM   Modules accepted: Orders

## 2016-07-25 NOTE — Telephone Encounter (Signed)
Caller name: [name]  Relation to pt: self  Call back number: [phone] Pharmacy:  Reason for call: Pt came in office for her lab appt and stated wanted to inform provider that from her last visit she has not gotten any better with the pain on both arms. Pt would like to know what to do. (pt informed provider already knows about her situation and that she was going to inform provider after a wk- appt was on July 14, 2016) Please advise.

## 2016-07-26 LAB — BASIC METABOLIC PANEL
BUN: 17 mg/dL (ref 7–25)
CO2: 17 mmol/L — ABNORMAL LOW (ref 20–31)
Calcium: 8.8 mg/dL (ref 8.6–10.4)
Chloride: 108 mmol/L (ref 98–110)
Creat: 1.01 mg/dL (ref 0.50–1.05)
Glucose, Bld: 88 mg/dL (ref 65–99)
POTASSIUM: 3.6 mmol/L (ref 3.5–5.3)
Sodium: 139 mmol/L (ref 135–146)

## 2016-07-26 NOTE — Telephone Encounter (Signed)
Called her and LMOM.  Will refer to ortho to look at her elbows- seemed to have tendonitis. Asked her to let me know if anything else is needed

## 2016-07-27 ENCOUNTER — Encounter: Payer: Self-pay | Admitting: Family Medicine

## 2016-08-07 ENCOUNTER — Ambulatory Visit: Payer: Self-pay | Admitting: Neurology

## 2016-08-07 ENCOUNTER — Ambulatory Visit: Payer: BLUE CROSS/BLUE SHIELD | Admitting: Neurology

## 2016-08-08 ENCOUNTER — Encounter: Payer: Self-pay | Admitting: Neurology

## 2016-08-13 ENCOUNTER — Encounter (INDEPENDENT_AMBULATORY_CARE_PROVIDER_SITE_OTHER): Payer: Self-pay | Admitting: Orthopedic Surgery

## 2016-08-13 ENCOUNTER — Ambulatory Visit (INDEPENDENT_AMBULATORY_CARE_PROVIDER_SITE_OTHER): Payer: Self-pay

## 2016-08-13 ENCOUNTER — Ambulatory Visit (INDEPENDENT_AMBULATORY_CARE_PROVIDER_SITE_OTHER): Payer: BLUE CROSS/BLUE SHIELD | Admitting: Orthopedic Surgery

## 2016-08-13 DIAGNOSIS — M25521 Pain in right elbow: Secondary | ICD-10-CM | POA: Diagnosis not present

## 2016-08-13 DIAGNOSIS — M25522 Pain in left elbow: Secondary | ICD-10-CM

## 2016-08-13 MED ORDER — DICLOFENAC SODIUM 2 % TD SOLN: 2.0000 | Freq: Two times a day (BID) | TRANSDERMAL | 1 refills | Status: DC | PRN

## 2016-08-13 NOTE — Progress Notes (Signed)
Office Visit Note   Patient: Janice Lucas           Date of Birth: 07/25/1965           MRN: [id number] Visit Date: 08/13/2016 Requested by: Darreld Mclean, MD [address] [place], [place] [address] PCP: Darreld Mclean, MD  Subjective: Chief Complaint  Patient presents with  . Elbow Pain    bilateral elbow pain    HPI: Janice Lucas is a 51 year old female with bilateral elbow pain left worse than right.  She is right-hand dominant.  She states that the pain comes and goes.  Worse if she hits her arms on something.  Denies any numbness and tingling in the hands.  Denies any neck pain.  Does not wake her from sleep with pain.  Denies any symptoms of trauma which preceded this pain.  She states pain is been there for a month.  Denies any swelling or locking in either elbow.  Localizes the pain to the medial epicondyle of both elbows.  Patient has some type of pseudotumor in her head and cannot really use nitroglycerin patches because of the headache potential.              ROS: All systems reviewed are negative as they relate to the chief complaint within the history of present illness.  Patient denies  fevers or chills.   Assessment & Plan: Visit Diagnoses:  1. Bilateral elbow joint pain     Plan: Impression is bilateral elbow pain left worse than right with exam consistent with medial epicondylitis and tendinosis.  Plan is topical anti-inflammatory.  We could try physical therapy which she also hold off on that intervention.  Nitroglycerin patch is not indicated in this patient.  Therapy would be the next step.  Follow-up with me as needed  Follow-Up Instructions: Return if symptoms worsen or fail to improve.   Orders:  Orders Placed This Encounter  Procedures  . XR Elbow 2 Views Right  . XR Elbow 2 Views Left   Meds ordered this encounter  Medications  . Diclofenac Sodium (PENNSAID) 2 % SOLN    Sig: Place 2 Squirts onto the skin 2 (two) times daily as  needed.    Dispense:  1 Bottle    Refill:  1      Procedures: No procedures performed   Clinical Data: No additional findings.  Objective: Vital Signs: There were no vitals taken for this visit.  Physical Exam:   Constitutional: Patient appears well-developed HEENT:  Head: Normocephalic Eyes:EOM are normal Neck: Normal range of motion Cardiovascular: Normal rate Pulmonary/chest: Effort normal Neurologic: Patient is alert Skin: Skin is warm Psychiatric: Patient has normal mood and affect    Ortho Exam: Orthopedic exam demonstrates full active and passive range of motion both elbows no subluxation of the ulnar nerve on either side.  Good cervical spine range of motion.  5 out of 5 grip EPL FPL interosseous wrist flexion and wrist extension biceps triceps and deltoid strength.  Does have tenderness to direct palpation of the medial condyle which is not worse with pronation and wrist flexion or finger flexion on either side.  No tenderness on the biceps tendon on either arm.  Specialty Comments:  No specialty comments available.  Imaging: Xr Elbow 2 Views Left  Result Date: 08/13/2016 AP lateral left elbow reviewed.  No ulnar humeral arthritis is present.  No effusion.  No soft tissue abnormalities.  Normal elbow  Xr  Elbow 2 Views Right  Result Date: 08/13/2016 AP lateral right elbow reviewed.  No arthritis present in the ulnohumeral joint.  Very small calcific ossicle noted 1-2 mm distal to the medial condyle consistent with calcific tendinopathy.    PMFS History: Patient Active Problem List   Diagnosis Date Noted  . Idiopathic intracranial hypertension 02/08/2016  . Migraine without aura and without status migrainosus, not intractable 02/08/2016  . Herniated nucleus pulposus, C6-7 right 02/14/2011   Past Medical History:  Diagnosis Date  . Hypertension   . Sleep apnea     Family History  Problem Relation Age of Onset  . Diabetes Maternal Aunt   . Diabetes  Maternal Uncle     Past Surgical History:  Procedure Laterality Date  . ANTERIOR CERVICAL DECOMP/DISCECTOMY FUSION  02/14/2011   Procedure: ANTERIOR CERVICAL DECOMPRESSION/DISCECTOMY FUSION 1 LEVEL;  Surgeon: Earleen Newport, MD;  Location: [facility];  Service: Neurosurgery;  Laterality: N/A;  Anterior Cervical Six-Seven Decompression and Fusion   Social History   Occupational History  . Not on file.   Social History Main Topics  . Smoking status: Former Smoker    Packs/day: 0.50    Types: Cigarettes  . Smokeless tobacco: Never Used  . Alcohol use No     Comment: occassionally  . Drug use: Yes  . Sexual activity: Not on file

## 2016-09-03 ENCOUNTER — Ambulatory Visit (INDEPENDENT_AMBULATORY_CARE_PROVIDER_SITE_OTHER): Payer: BLUE CROSS/BLUE SHIELD | Admitting: Neurology

## 2016-09-03 VITALS — BP 118/72 | HR 88 | Ht 67.0 in | Wt 271.0 lb

## 2016-09-03 DIAGNOSIS — G43009 Migraine without aura, not intractable, without status migrainosus: Secondary | ICD-10-CM | POA: Diagnosis not present

## 2016-09-03 DIAGNOSIS — G932 Benign intracranial hypertension: Secondary | ICD-10-CM | POA: Diagnosis not present

## 2016-09-03 MED ORDER — ACETAZOLAMIDE ER 500 MG PO CP12: 1000.0000 mg | ORAL_CAPSULE | Freq: Two times a day (BID) | ORAL | 3 refills | Status: DC

## 2016-09-03 MED ORDER — NORTRIPTYLINE HCL 25 MG PO CAPS: ORAL_CAPSULE | ORAL | 3 refills | Status: DC

## 2016-09-03 MED ORDER — TOPIRAMATE 100 MG PO TABS: 100.0000 mg | ORAL_TABLET | Freq: Two times a day (BID) | ORAL | 3 refills | Status: DC

## 2016-09-03 NOTE — Progress Notes (Signed)
NEUROLOGY FOLLOW UP OFFICE NOTE  Janice Lucas [id number] 17-May-1965  HISTORY OF PRESENT ILLNESS: I had the pleasure of seeing Janice Lucas in follow-up in the neurology clinic on 09/03/2016.  The patient was last seen 7 months ago for pseudotumor cerebri and migraines. Her wife had called our office last May to report she had hit her head on a 2x4 and was having more headaches. She reports hitting the right frontal region, no loss of consciousness. She has pain mostly on the vertex region or right side of her head. Around 2-3 weeks ago, she had headaches for 3 days in a row, recently she has headaches around three days a week and takes Tylenol three times a week. There is no associated nausea/vomiting, photo/phonophobia. No neck/back pain. She reports vision is good, no loss of vision or blurred vision. She was having paresthesias that resolved now that she eats bananas daily. She gets 8 hours of restful sleep, her CPAP machine helps. No falls.   HPI 02/08/2016: This is a pleasant 51 yo RH woman with a history of pseudotumor cerebri diagnosed in 2015. Records from [facility] were reviewed, she presented to her eye doctor for a routine exam in April 2015 with a 18-month history of headaches thinking she may need new glasses. She was noted to have mild papilledema, right > left. At that time, headaches were behind her eyes and at the vertex, daily, worsened by bending, lifting, coughing, and sneezing. MRI and MRV brain were reported as normal. She had a lumbar puncture with opening pressure of 25 in the lateral position. They report problems with her LP, she had a post-spinal tap headache that initial blood patch did not help with. She was started on Topamax and Diamox, and nortriptyline was added for migraine prevention. She was doing very well on this regimen with rare headaches. Her neurologist Dr. Baltazar Najjar left the practice and she saw Dr. Ermalene Postin last October, with note of improvement in eye  exam from last visit, no evidence of papilledema, and rare headaches. They discussed reducing some of her medications, Topamax was reduced to 100mg  daily. With reduction in Topamax, she noticed an increase in headaches, particularly in the morning. She then lost her insurance and was unable to obtain the Diamox. Without this, headaches became much worse. She describes them as diffuse sharp pain, with occasional photo and phonophobia when severe, no nausea/vomiting. Bending over causes floaters in her vision. She has intermittent tinnitus (non-pulsatile). She feels her vision is getting worse, she is straining more to use the computer with blurry vision, no loss of vision. No driving difficulties with peripheral vision.   She has been back on the Diamox after seeing her PCP, currently on uptitration taking 500mg  BID, then increasing back to original dose of 1000mg  BID. She had some paresthesias on the Diamox, and found that bananas help with this. She denies any dysarthria/dysphagia, neck/back pain, bowel/bladder dysfunction, dizziness, no falls. She reports some numbness in the three fingers of her right hand, they feel cold suddenly ("like ice cubes") and hurt. 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   PAST MEDICAL HISTORY: Past Medical History:  Diagnosis Date  . Hypertension   . Sleep apnea     MEDICATIONS: Current Outpatient Prescriptions on File Prior to Visit  Medication Sig Dispense Refill  . acetaminophen (TYLENOL) 500 MG tablet Take 500 mg by mouth every 6 (six) hours as needed.    Marland Kitchen acetaZOLAMIDE (DIAMOX)  500 MG capsule Take 2 capsules (1,000 mg total) by mouth 2 (two) times daily. 360 capsule 3  . aspirin EC 81 MG tablet Take 81 mg by mouth daily.    . Diclofenac Sodium (PENNSAID) 2 % SOLN Place 2 Squirts onto the skin 2 (two) times daily as needed. 1 Bottle 1  . lisinopril-hydrochlorothiazide (PRINZIDE,ZESTORETIC) 20-25 MG tablet Take 1 tablet  by mouth daily. 30 tablet 0  . nortriptyline (PAMELOR) 25 MG capsule Take 1 capsule (25 mg total) by mouth at bedtime. 90 capsule 3  . omeprazole (PRILOSEC) 20 MG capsule Take 1 capsule (20 mg total) by mouth daily. 90 capsule 3  . potassium chloride SA (K-DUR,KLOR-CON) 20 MEQ tablet Take 1 tablet (20 mEq total) by mouth daily. Take for 5 days.  Then stop pending repeat blood level 30 tablet 0  . topiramate (TOPAMAX) 100 MG tablet Take 1 tablet (100 mg total) by mouth 2 (two) times daily. 180 tablet 3   No current facility-administered medications on file prior to visit.     ALLERGIES: Allergies  Allergen Reactions  . Sulfa Antibiotics Other (See Comments)    Acts "goofy"    FAMILY HISTORY: Family History  Problem Relation Age of Onset  . Diabetes Maternal Aunt   . Diabetes Maternal Uncle     SOCIAL HISTORY: Social History   Social History  . Marital status: Single    Spouse name: N/A  . Number of children: N/A  . Years of education: N/A   Occupational History  . Not on file.   Social History Main Topics  . Smoking status: Former Smoker    Packs/day: 0.50    Types: Cigarettes  . Smokeless tobacco: Never Used  . Alcohol use No     Comment: occassionally  . Drug use: Yes  . Sexual activity: Not on file   Other Topics Concern  . Not on file   Social History Narrative  . No narrative on file    REVIEW OF SYSTEMS: Constitutional: No fevers, chills, or sweats, no generalized fatigue, change in appetite Eyes: No visual changes, double vision, eye pain Ear, nose and throat: No hearing loss, ear pain, nasal congestion, sore throat Cardiovascular: No chest pain, palpitations Respiratory:  No shortness of breath at rest or with exertion, wheezes GastrointestinaI: No nausea, vomiting, diarrhea, abdominal pain, fecal incontinence Genitourinary:  No dysuria, urinary retention or frequency Musculoskeletal:  No neck pain, back pain Integumentary: No rash, pruritus, skin  lesions Neurological: as above Psychiatric: No depression, insomnia, anxiety Endocrine: No palpitations, fatigue, diaphoresis, mood swings, change in appetite, change in weight, increased thirst Hematologic/Lymphatic:  No anemia, purpura, petechiae. Allergic/Immunologic: no itchy/runny eyes, nasal congestion, recent allergic reactions, rashes  PHYSICAL EXAM: There were no vitals filed for this visit. General: No acute distress Head:  Normocephalic/atraumatic Neck: supple, no paraspinal tenderness, full range of motion Heart:  Regular rate and rhythm Lungs:  Clear to auscultation bilaterally Back: No paraspinal tenderness Skin/Extremities: No rash, no edema Neurological Exam: alert and oriented to person, place, and time. No aphasia or dysarthria. Fund of knowledge is appropriate.  Recent and remote memory are intact.  Attention and concentration are normal.    Able to name objects and repeat phrases. Cranial nerves: Pupils equal, round, reactive to light.  Fundoscopic exam unremarkable, no papilledema. Extraocular movements intact with no nystagmus. Visual fields full. Facial sensation intact. No facial asymmetry. Tongue, uvula, palate midline.  Motor: Bulk and tone normal, muscle strength 5/5 throughout with no pronator  drift.  Sensation to light touch, temperature and vibration intact.  No extinction to double simultaneous stimulation.  Deep tendon reflexes 2+ throughout, toes downgoing.  Finger to nose testing intact.  Gait narrow-based and steady, able to tandem walk adequately.  Romberg negative.  IMPRESSION: This is a pleasant 51 yo RH woman with a history of pseudotumor cerebri diagnosed in 2015. Headaches were well-controlled on Diamox 1000mg  BID, Topamax 100mg , and she was also started on nortriptyline 25mg  qhs for migraine prophylaxis. She reports doing overall well except for increase in headaches after hitting her head 3 months ago. Neurological exam normal, no papilledema on exam. She  will increase nortriptyline to 50mg  qhs, continue current dose of Topiramate and Diamox. She was advised to minimize Tylenol intake to 2-3 times a week to avoid rebound headaches. She will follow-up in 6 months and knows to call for any changes.  Thank you for allowing me to participate in her care.  Please do not hesitate to call for any questions or concerns.  The duration of this appointment visit was 25 minutes of face-to-face time with the patient.  Greater than 50% of this time was spent in counseling, explanation of diagnosis, planning of further management, and coordination of care.   Ellouise Newer, M.D.   CC: Dr. Lorelei Pont

## 2016-09-03 NOTE — Patient Instructions (Signed)
1. Increase nortriptyline 25mg : take 2 caps every night 2. Continue Topamax and Diamox 3. Minimize Tylenol intake to 2-3 times a week, otherwise headaches may worsen 4. Follow-up in 6 months, call for any changes

## 2016-09-10 ENCOUNTER — Encounter: Payer: Self-pay | Admitting: Neurology

## 2016-09-16 ENCOUNTER — Telehealth: Payer: Self-pay | Admitting: *Deleted

## 2016-09-16 NOTE — Telephone Encounter (Signed)
Received Cologuard Order Cancellation [#[id number]] from [employer]; forwarded to provider/SLS 09/04

## 2016-09-29 ENCOUNTER — Ambulatory Visit: Payer: Self-pay | Admitting: Neurology

## 2016-10-10 ENCOUNTER — Encounter: Payer: Self-pay | Admitting: Medical

## 2016-10-10 ENCOUNTER — Ambulatory Visit (INDEPENDENT_AMBULATORY_CARE_PROVIDER_SITE_OTHER): Payer: BLUE CROSS/BLUE SHIELD | Admitting: Medical

## 2016-10-10 VITALS — BP 112/60 | HR 83 | Temp 98.1°F | Resp 16 | Ht 67.0 in | Wt 270.8 lb

## 2016-10-10 DIAGNOSIS — L089 Local infection of the skin and subcutaneous tissue, unspecified: Secondary | ICD-10-CM | POA: Diagnosis not present

## 2016-10-10 MED ORDER — DOXYCYCLINE HYCLATE 100 MG PO TABS: 100.0000 mg | ORAL_TABLET | Freq: Two times a day (BID) | ORAL | 0 refills | Status: DC

## 2016-10-10 NOTE — Progress Notes (Signed)
Subjective:    Patient ID: Janice Lucas, female    DOB: [birth year], 51 y.o.   MRN: [id number]  HPI  Pt in with left upper eye lid swollen area that came up quickly. Noticed it yesterday evening when got home for work. No trauma. No visual disturbance. Pt does not wear goggle at work but did notice today that glasses rubbing against area is hurting.   Pt has has some occasional skin infection in past before.    Review of Systems  Constitutional: Negative for chills, fatigue and fever.  HENT: Negative for congestion, facial swelling, hearing loss, mouth sores, nosebleeds, postnasal drip, rhinorrhea, sinus pain and sinus pressure.   Respiratory: Negative for cough, shortness of breath and wheezing.   Cardiovascular: Negative for chest pain and palpitations.  Gastrointestinal: Negative for abdominal pain.  Skin:       See hpi.  Neurological: Negative for dizziness and headaches.  Hematological: Negative for adenopathy.  Psychiatric/Behavioral: Negative for agitation and confusion.    Past Medical History:  Diagnosis Date  . Hypertension   . Sleep apnea      Social History   Social History  . Marital status: Single    Spouse name: N/A  . Number of children: N/A  . Years of education: N/A   Occupational History  . Not on file.   Social History Main Topics  . Smoking status: Former Smoker    Packs/day: 0.50    Types: Cigarettes  . Smokeless tobacco: Never Used  . Alcohol use No     Comment: occassionally  . Drug use: Yes  . Sexual activity: Not on file   Other Topics Concern  . Not on file   Social History Narrative  . No narrative on file    Past Surgical History:  Procedure Laterality Date  . ANTERIOR CERVICAL DECOMP/DISCECTOMY FUSION  02/14/2011   Procedure: ANTERIOR CERVICAL DECOMPRESSION/DISCECTOMY FUSION 1 LEVEL;  Surgeon: Earleen Newport, MD;  Location: [facility];  Service: Neurosurgery;  Laterality: N/A;  Anterior Cervical Six-Seven Decompression  and Fusion    Family History  Problem Relation Age of Onset  . Diabetes Maternal Aunt   . Diabetes Maternal Uncle     Allergies  Allergen Reactions  . Sulfa Antibiotics Other (See Comments)    Acts "goofy"  . Neosporin  [Neomycin-Bacitracin Zn-Polymyx] Rash    Current Outpatient Prescriptions on File Prior to Visit  Medication Sig Dispense Refill  . acetaminophen (TYLENOL) 500 MG tablet Take 500 mg by mouth every 6 (six) hours as needed.    Marland Kitchen acetaZOLAMIDE (DIAMOX) 500 MG capsule Take 2 capsules (1,000 mg total) by mouth 2 (two) times daily. 360 capsule 3  . aspirin EC 81 MG tablet Take 81 mg by mouth daily.    . Diclofenac Sodium (PENNSAID) 2 % SOLN Place 2 Squirts onto the skin 2 (two) times daily as needed. 1 Bottle 1  . lisinopril-hydrochlorothiazide (PRINZIDE,ZESTORETIC) 20-25 MG tablet Take 1 tablet by mouth daily. 30 tablet 0  . nortriptyline (PAMELOR) 25 MG capsule Take 2 caps at bedtime 180 capsule 3  . omeprazole (PRILOSEC) 20 MG capsule Take 1 capsule (20 mg total) by mouth daily. 90 capsule 3  . potassium chloride SA (K-DUR,KLOR-CON) 20 MEQ tablet Take 1 tablet (20 mEq total) by mouth daily. Take for 5 days.  Then stop pending repeat blood level 30 tablet 0  . topiramate (TOPAMAX) 100 MG tablet Take 1 tablet (100 mg total) by mouth 2 (two) times  daily. 180 tablet 3   No current facility-administered medications on file prior to visit.     BP 112/60   Pulse 83   Temp 98.1 F (36.7 C) (Oral)   Resp 16   Ht 5\' 7"  (1.702 m)   Wt 270 lb 12.8 oz (122.8 kg)   SpO2 98%   BMI 42.41 kg/m       Objective:   Physical Exam   General- No acute distress. Pleasant patient. Neck- Full range of motion, no jvd Lungs- Clear, even and unlabored. Heart- regular rate and rhythm. Neurologic- CNII- XII grossly intact.  Skin- left upper eyelid medial upper portion 8 mm approximate sized mild red area that is red and tender to touch.     Assessment & Plan:  Your left upper  eyelid region looks probable early skin infection present. I wrote you for doxycycline antibiotic. Rx advisement given.  We need to make sure that the area is progressively getting better. Please make appointment for Tuesday morning.  If the area does not completely flatten out/return back to normal then would consider referral to specialist..  If area were to worsen over the weekend then recommend ED evaluation.  Follow up on Tuesday or as needed.  Levell Tavano, Percell Miller, PA-C

## 2016-10-10 NOTE — Patient Instructions (Addendum)
Your left upper eyelid region looks probable early skin infection present. I wrote you for doxycycline antibiotic. Rx advisement given.  We need to make sure that the area is progressively getting better. Please make appointment for Tuesday morning.  If the area does not completely flatten out/return back to normal then would consider referral to specialist..  If area were to worsen over the weekend then recommend ED evaluation.  Follow up on Tuesday or as needed.

## 2016-10-14 ENCOUNTER — Encounter: Payer: Self-pay | Admitting: Medical

## 2016-10-14 ENCOUNTER — Ambulatory Visit (INDEPENDENT_AMBULATORY_CARE_PROVIDER_SITE_OTHER): Payer: BLUE CROSS/BLUE SHIELD | Admitting: Medical

## 2016-10-14 VITALS — BP 107/69 | HR 77 | Temp 97.8°F | Resp 16 | Ht 67.0 in | Wt 270.4 lb

## 2016-10-14 DIAGNOSIS — L989 Disorder of the skin and subcutaneous tissue, unspecified: Secondary | ICD-10-CM | POA: Diagnosis not present

## 2016-10-14 DIAGNOSIS — L089 Local infection of the skin and subcutaneous tissue, unspecified: Secondary | ICD-10-CM

## 2016-10-14 NOTE — Patient Instructions (Addendum)
For your left upper eyelid skin lesion/possible infection  that is not responding as expected to the antibiotic, I put in referral for dermatologist. I am hoping to get you appointment by tomorrow or Thursday. If you don't get call from Anderson Malta or Silvestre Moment by tomorrow afternoon then recommend you call here for update.  I want you to continue the doxycycline until you see dermatologist.  Follow-up as needed for any changing or worsening signs/symptoms pending dermatology referral.

## 2016-10-14 NOTE — Progress Notes (Signed)
Subjective:    Patient ID: Janice Lucas, female    DOB: 1965-10-02, 51 y.o.   MRN: [id number]  HPI  Pt in for follow up.  Pt here to follow up on Friday visit   No fever, no chills or sweats. Left upper eye lid lesion looks about the same. Same level of discomfort.   She is 3.5 days into treatment of doxycycline. But still tender area left upper eye lid.     Review of Systems  Constitutional: Negative for chills, fatigue and fever.  HENT: Negative for ear pain, hearing loss, mouth sores, postnasal drip, rhinorrhea and sinus pain.   Respiratory: Negative for cough, chest tightness, shortness of breath and wheezing.   Cardiovascular: Negative for chest pain and palpitations.  Gastrointestinal: Negative for abdominal pain.  Musculoskeletal: Negative for back pain and joint swelling.  Skin:       See  hpi and physical exam.  Neurological: Negative for dizziness, seizures, syncope, weakness, numbness and headaches.  Hematological: Negative for adenopathy.  Psychiatric/Behavioral: Negative for behavioral problems and confusion.   Past Medical History:  Diagnosis Date  . Hypertension   . Sleep apnea      Social History   Social History  . Marital status: Single    Spouse name: N/A  . Number of children: N/A  . Years of education: N/A   Occupational History  . Not on file.   Social History Main Topics  . Smoking status: Former Smoker    Packs/day: 0.50    Types: Cigarettes  . Smokeless tobacco: Never Used  . Alcohol use No     Comment: occassionally  . Drug use: Yes  . Sexual activity: Not on file   Other Topics Concern  . Not on file   Social History Narrative  . No narrative on file    Past Surgical History:  Procedure Laterality Date  . ANTERIOR CERVICAL DECOMP/DISCECTOMY FUSION  02/14/2011   Procedure: ANTERIOR CERVICAL DECOMPRESSION/DISCECTOMY FUSION 1 LEVEL;  Surgeon: Earleen Newport, MD;  Location: [facility];  Service: Neurosurgery;  Laterality:  N/A;  Anterior Cervical Six-Seven Decompression and Fusion    Family History  Problem Relation Age of Onset  . Diabetes Maternal Aunt   . Diabetes Maternal Uncle     Allergies  Allergen Reactions  . Sulfa Antibiotics Other (See Comments)    Acts "goofy"  . Neosporin  [Neomycin-Bacitracin Zn-Polymyx] Rash    Current Outpatient Prescriptions on File Prior to Visit  Medication Sig Dispense Refill  . acetaminophen (TYLENOL) 500 MG tablet Take 500 mg by mouth every 6 (six) hours as needed.    Marland Kitchen acetaZOLAMIDE (DIAMOX) 500 MG capsule Take 2 capsules (1,000 mg total) by mouth 2 (two) times daily. 360 capsule 3  . aspirin EC 81 MG tablet Take 81 mg by mouth daily.    . Diclofenac Sodium (PENNSAID) 2 % SOLN Place 2 Squirts onto the skin 2 (two) times daily as needed. 1 Bottle 1  . doxycycline (VIBRA-TABS) 100 MG tablet Take 1 tablet (100 mg total) by mouth 2 (two) times daily. Can give caps or generic 20 tablet 0  . lisinopril-hydrochlorothiazide (PRINZIDE,ZESTORETIC) 20-25 MG tablet Take 1 tablet by mouth daily. 30 tablet 0  . nortriptyline (PAMELOR) 25 MG capsule Take 2 caps at bedtime 180 capsule 3  . omeprazole (PRILOSEC) 20 MG capsule Take 1 capsule (20 mg total) by mouth daily. 90 capsule 3  . potassium chloride SA (K-DUR,KLOR-CON) 20 MEQ tablet Take 1  tablet (20 mEq total) by mouth daily. Take for 5 days.  Then stop pending repeat blood level 30 tablet 0  . topiramate (TOPAMAX) 100 MG tablet Take 1 tablet (100 mg total) by mouth 2 (two) times daily. 180 tablet 3   No current facility-administered medications on file prior to visit.     BP 107/69   Pulse 77   Temp 97.8 F (36.6 C) (Oral)   Resp 16   Ht 5\' 7"  (1.702 m)   Wt 270 lb 6.4 oz (122.7 kg)   SpO2 100%   BMI 42.35 kg/m       Objective:   Physical Exam  General- No acute distress. Pleasant patient. Neck- Full range of motion, no jvd Lungs- Clear, even and unlabored. Heart- regular rate and rhythm. Neurologic-  CNII- XII grossly intact.  Skin- left upper eyelid medial upper portion 8 mm approximate sized mild red area that is  tender to touch.(similar appearance as before only incrimental improvement at best)      Assessment & Plan:  For your left upper eyelid skin lesion/possible infection  that is not responding as expected to the antibiotic, I put in referral for dermatologist. I am hoping to get you appointment by tomorrow or Thursday. If you don't get call from Anderson Malta or Silvestre Moment by tomorrow afternoon then recommend you call here for update.  I want you to continue the doxycycline until you see dermatologist.  Follow-up as needed for any changing or worsening signs/symptoms pending dermatology referral.  Mackie Pai, PA-C

## 2017-03-02 ENCOUNTER — Other Ambulatory Visit: Payer: Self-pay

## 2017-03-02 ENCOUNTER — Encounter: Payer: Self-pay | Admitting: Neurology

## 2017-03-02 ENCOUNTER — Ambulatory Visit: Payer: BLUE CROSS/BLUE SHIELD | Admitting: Neurology

## 2017-03-02 VITALS — BP 110/76 | HR 83 | Ht 66.5 in | Wt 275.0 lb

## 2017-03-02 DIAGNOSIS — G932 Benign intracranial hypertension: Secondary | ICD-10-CM

## 2017-03-02 DIAGNOSIS — G43009 Migraine without aura, not intractable, without status migrainosus: Secondary | ICD-10-CM | POA: Diagnosis not present

## 2017-03-02 MED ORDER — ACETAZOLAMIDE ER 500 MG PO CP12: 1000.0000 mg | ORAL_CAPSULE | Freq: Two times a day (BID) | ORAL | 3 refills | Status: DC

## 2017-03-02 MED ORDER — NORTRIPTYLINE HCL 25 MG PO CAPS: ORAL_CAPSULE | ORAL | 3 refills | Status: DC

## 2017-03-02 MED ORDER — TOPIRAMATE 100 MG PO TABS
100.0000 mg | ORAL_TABLET | Freq: Two times a day (BID) | ORAL | 3 refills | Status: DC
Start: 2017-03-02 — End: 2017-12-07

## 2017-03-02 NOTE — Patient Instructions (Addendum)
Great seeing you! Continue all your medications. Please have your PCP draw bloodwork to check your electrolytes. Follow-up with eye doctor as scheduled, please have him send report and if any significant issues, we may adjust your medications. Follow-up in 6 months, call for any changes

## 2017-03-02 NOTE — Progress Notes (Signed)
NEUROLOGY FOLLOW UP OFFICE NOTE  Janice Lucas [id number] [birth year]  HISTORY OF PRESENT ILLNESS: I had the pleasure of seeing Janice Lucas in follow-up in the neurology clinic on 03/02/2017.  The patient was last seen 6 months ago for pseudotumor cerebri and migraines. On her last visit, she reported an increase in headaches after hitting her head. Nortriptyline dose was increased to 50mg  qhs. She reports that headaches are better. She is still getting them, but feels they are more related to stress from family issues. She reports having a lot on her mind. Headaches occur around twice a week, Tylenol helps. She denies any vision changes or vision loss, she is seeing her eye doctor next month. She denies any dizziness, focal numbness/tingling/weakness, no falls. Sleep is good, she is on a CPAP machine. She is also taking Topamax 100mg  BID and Diamox 1000mg  BID without side effects.  Laboratory Data:   Chemistry      Component Value Date/Time   NA 139 07/25/2016 1629   K 3.6 07/25/2016 1629   CL 108 07/25/2016 1629   CO2 17 (L) 07/25/2016 1629   BUN 17 07/25/2016 1629   CREATININE 1.01 07/25/2016 1629      Component Value Date/Time   CALCIUM 8.8 07/25/2016 1629   ALKPHOS 57 02/06/2016 0949   AST 18 02/06/2016 0949   ALT 21 02/06/2016 0949   BILITOT 0.3 02/06/2016 0949      HPI 02/08/2016: This is a pleasant 52 yo RH woman with a history of pseudotumor cerebri diagnosed in 2015. Records from [facility] were reviewed, she presented to her eye doctor for a routine exam in April 2015 with a 80-month history of headaches thinking she may need new glasses. She was noted to have mild papilledema, right > left. At that time, headaches were behind her eyes and at the vertex, daily, worsened by bending, lifting, coughing, and sneezing. MRI and MRV brain were reported as normal. She had a lumbar puncture with opening pressure of 25 in the lateral position. They report problems with  her LP, she had a post-spinal tap headache that initial blood patch did not help with. She was started on Topamax and Diamox, and nortriptyline was added for migraine prevention. She was doing very well on this regimen with rare headaches. Her neurologist Dr. Baltazar Lucas left the practice and she saw Dr. Ermalene Lucas last October, with note of improvement in eye exam from last visit, no evidence of papilledema, and rare headaches. They discussed reducing some of her medications, Topamax was reduced to 100mg  daily. With reduction in Topamax, she noticed an increase in headaches, particularly in the morning. She then lost her insurance and was unable to obtain the Diamox. Without this, headaches became much worse. She describes them as diffuse sharp pain, with occasional photo and phonophobia when severe, no nausea/vomiting. Bending over causes floaters in her vision. She has intermittent tinnitus (non-pulsatile). She feels her vision is getting worse, she is straining more to use the computer with blurry vision, no loss of vision. No driving difficulties with peripheral vision.   She has been back on the Diamox after seeing her PCP, currently on uptitration taking 500mg  BID, then increasing back to original dose of 1000mg  BID. She had some paresthesias on the Diamox, and found that bananas help with this. She denies any dysarthria/dysphagia, neck/back pain, bowel/bladder dysfunction, dizziness, no falls. She reports some numbness in the three fingers of her right hand, they feel cold suddenly ("like ice cubes") and  hurt. 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   PAST MEDICAL HISTORY: Past Medical History:  Diagnosis Date  . Hypertension   . Sleep apnea     MEDICATIONS: Current Outpatient Medications on File Prior to Visit  Medication Sig Dispense Refill  . acetaminophen (TYLENOL) 500 MG tablet Take 500 mg by mouth every 6 (six) hours as needed.    Marland Kitchen acetaZOLAMIDE (DIAMOX)  500 MG capsule Take 2 capsules (1,000 mg total) by mouth 2 (two) times daily. 360 capsule 3  . aspirin EC 81 MG tablet Take 81 mg by mouth daily.    . Diclofenac Sodium (PENNSAID) 2 % SOLN Place 2 Squirts onto the skin 2 (two) times daily as needed. 1 Bottle 1  . doxycycline (VIBRA-TABS) 100 MG tablet Take 1 tablet (100 mg total) by mouth 2 (two) times daily. Can give caps or generic 20 tablet 0  . lisinopril-hydrochlorothiazide (PRINZIDE,ZESTORETIC) 20-25 MG tablet Take 1 tablet by mouth daily. 30 tablet 0  . nortriptyline (PAMELOR) 25 MG capsule Take 2 caps at bedtime 180 capsule 3  . omeprazole (PRILOSEC) 20 MG capsule Take 1 capsule (20 mg total) by mouth daily. 90 capsule 3  . potassium chloride SA (K-DUR,KLOR-CON) 20 MEQ tablet Take 1 tablet (20 mEq total) by mouth daily. Take for 5 days.  Then stop pending repeat blood level 30 tablet 0  . topiramate (TOPAMAX) 100 MG tablet Take 1 tablet (100 mg total) by mouth 2 (two) times daily. 180 tablet 3   No current facility-administered medications on file prior to visit.     ALLERGIES: Allergies  Allergen Reactions  . Sulfa Antibiotics Other (See Comments)    Acts "goofy"  . Neosporin  [Neomycin-Bacitracin Zn-Polymyx] Rash    FAMILY HISTORY: Family History  Problem Relation Age of Onset  . Diabetes Maternal Aunt   . Diabetes Maternal Uncle     SOCIAL HISTORY: Social History   Socioeconomic History  . Marital status: Single    Spouse name: Not on file  . Number of children: Not on file  . Years of education: Not on file  . Highest education level: Not on file  Social Needs  . Financial resource strain: Not on file  . Food insecurity - worry: Not on file  . Food insecurity - inability: Not on file  . Transportation needs - medical: Not on file  . Transportation needs - non-medical: Not on file  Occupational History  . Not on file  Tobacco Use  . Smoking status: Former Smoker    Packs/day: 0.50    Types: Cigarettes  .  Smokeless tobacco: Never Used  Substance and Sexual Activity  . Alcohol use: No    Comment: occassionally  . Drug use: Yes  . Sexual activity: Not on file  Other Topics Concern  . Not on file  Social History Narrative  . Not on file    REVIEW OF SYSTEMS: Constitutional: No fevers, chills, or sweats, no generalized fatigue, change in appetite Eyes: No visual changes, double vision, eye pain Ear, nose and throat: No hearing loss, ear pain, nasal congestion, sore throat Cardiovascular: No chest pain, palpitations Respiratory:  No shortness of breath at rest or with exertion, wheezes GastrointestinaI: No nausea, vomiting, diarrhea, abdominal pain, fecal incontinence Genitourinary:  No dysuria, urinary retention or frequency Musculoskeletal:  No neck pain, back pain Integumentary: No rash, pruritus, skin lesions Neurological: as above Psychiatric: No depression, insomnia, anxiety Endocrine: No palpitations, fatigue, diaphoresis,  mood swings, change in appetite, change in weight, increased thirst Hematologic/Lymphatic:  No anemia, purpura, petechiae. Allergic/Immunologic: no itchy/runny eyes, nasal congestion, recent allergic reactions, rashes  PHYSICAL EXAM: Vitals:   03/02/17 1559  BP: 110/76  Pulse: 83  SpO2: 97%   General: No acute distress Head:  Normocephalic/atraumatic Neck: supple, no paraspinal tenderness, full range of motion Heart:  Regular rate and rhythm Lungs:  Clear to auscultation bilaterally Back: No paraspinal tenderness Skin/Extremities: No rash, no edema Neurological Exam: alert and oriented to person, place, and time. No aphasia or dysarthria. Fund of knowledge is appropriate.  Recent and remote memory are intact.  Attention and concentration are normal.    Able to name objects and repeat phrases. Cranial nerves: Pupils equal, round, reactive to light.  Fundoscopic exam unremarkable, no papilledema. Extraocular movements intact with no nystagmus. Visual fields  full. Facial sensation intact. No facial asymmetry. Tongue, uvula, palate midline.  Motor: Bulk and tone normal, muscle strength 5/5 throughout with no pronator drift.  Sensation to light touch, temperature and vibration intact.  No extinction to double simultaneous stimulation.  Deep tendon reflexes 2+ throughout, toes downgoing.  Finger to nose testing intact.  Gait narrow-based and steady, able to tandem walk adequately.  Romberg negative.  IMPRESSION: This is a pleasant 52 yo RH woman with a history of pseudotumor cerebri diagnosed in 2015. Headaches were well-controlled on Diamox 1000mg  BID, Topamax 100mg , she is also on nortriptyline 50mg  qhs  for migraine prophylaxis. She is satisfied with current headache control and endorses a lot of stress as cause of recent headaches. Continue current medications. Neurological exam normal, no papilledema on exam today, she has a follow-up with her eye doctor next month. She knows to minimize Tylenol intake to 2-3 times a week to avoid rebound headaches. We discussed routine BMP to monitor for acidosis on her medications, she opted to do bloodwork with her next PCP visit. She will follow-up in 6 months and knows to call for any changes.  Thank you for allowing me to participate in her care.  Please do not hesitate to call for any questions or concerns.  The duration of this appointment visit was 25 minutes of face-to-face time with the patient.  Greater than 50% of this time was spent in counseling, explanation of diagnosis, planning of further management, and coordination of care.   Ellouise Newer, M.D.   CC: Dr. Lorelei Pont

## 2017-03-03 ENCOUNTER — Encounter: Payer: Self-pay | Admitting: Family Medicine

## 2017-03-03 NOTE — Progress Notes (Addendum)
[place] at [facility] [address] [place], [place] [address] [phone]  Date:  03/05/2017   Name:  Janice Lucas   DOB:  [birth year]   MRN:  [id number]  PCP:  Darreld Mclean, MD    Chief Complaint: Follow-up (Pt here for labs, refill on omeprazole and lisinopril. Flu vaccine today. )   History of Present Illness:  Janice Lucas is a 52 y.o. very pleasant female patient who presents with the following:  History of idiopathic intracranial HTN Here today for a follow-up and labs- would like to have full fasting labs today  She saw her neurologist on 2/18:  This is a pleasant 52 yo RH womanwith a history of pseudotumor cerebri diagnosed in 2015. Headaches were well-controlled on Diamox 1000mg  BID, Topamax 100mg , she is also on nortriptyline 50mg  qhs  for migraine prophylaxis. She is satisfied with current headache control and endorses a lot of stress as cause of recent headaches. Continue current medications. Neurological exam normal, no papilledema on exam today, she has a follow-up with her eye doctor next month. She knows to minimize Tylenol intake to 2-3 times a week to avoid rebound headaches. We discussed routine BMP to monitor for acidosis on her medications, she opted to do bloodwork with her next PCP visit. She will follow-up in 6 months and knows to call for any changes  Flu shot today Needs refills on a couple of her medications which I will take care of She needs a mammogram- I will order this for her Colon cancer screening needed- she has no family history of colon cancer-after discussion she is interested in Cologuard, will order for her today  She has been fasting for 8 hours now  She is feeling well, her headaches have not been overly bothersome She is also seeing her eye doctor on a regular basis  Patient Active Problem List   Diagnosis Date Noted  . Idiopathic intracranial hypertension 02/08/2016  .  Migraine without aura and without status migrainosus, not intractable 02/08/2016  . Herniated nucleus pulposus, C6-7 right 02/14/2011    Past Medical History:  Diagnosis Date  . Hypertension   . Sleep apnea     Past Surgical History:  Procedure Laterality Date  . ANTERIOR CERVICAL DECOMP/DISCECTOMY FUSION  02/14/2011   Procedure: ANTERIOR CERVICAL DECOMPRESSION/DISCECTOMY FUSION 1 LEVEL;  Surgeon: Earleen Newport, MD;  Location: [facility];  Service: Neurosurgery;  Laterality: N/A;  Anterior Cervical Six-Seven Decompression and Fusion    Social History   Tobacco Use  . Smoking status: Former Smoker    Packs/day: 0.50    Types: Cigarettes  . Smokeless tobacco: Never Used  Substance Use Topics  . Alcohol use: No    Comment: occassionally  . Drug use: Yes    Family History  Problem Relation Age of Onset  . Diabetes Maternal Aunt   . Diabetes Maternal Uncle     Allergies  Allergen Reactions  . Sulfa Antibiotics Other (See Comments)    Acts "goofy"  . Neosporin  [Neomycin-Bacitracin Zn-Polymyx] Rash    Medication list has been reviewed and updated.  Current Outpatient Medications on File Prior to Visit  Medication Sig Dispense Refill  . acetaminophen (TYLENOL) 500 MG tablet Take 500 mg by mouth every 6 (six) hours as needed.    Marland Kitchen acetaZOLAMIDE (DIAMOX) 500 MG capsule Take 2 capsules (1,000 mg total) by mouth 2 (two) times daily. 360 capsule 3  .  aspirin EC 81 MG tablet Take 81 mg by mouth daily.    . Diclofenac Sodium (PENNSAID) 2 % SOLN Place 2 Squirts onto the skin 2 (two) times daily as needed. 1 Bottle 1  . nortriptyline (PAMELOR) 25 MG capsule Take 2 caps at bedtime 180 capsule 3  . potassium chloride SA (K-DUR,KLOR-CON) 20 MEQ tablet Take 1 tablet (20 mEq total) by mouth daily. Take for 5 days.  Then stop pending repeat blood level 30 tablet 0  . topiramate (TOPAMAX) 100 MG tablet Take 1 tablet (100 mg total) by mouth 2 (two) times daily. 180 tablet 3   No  current facility-administered medications on file prior to visit.     Review of Systems:  As per HPI- otherwise negative. No fever or chills No CP or SOB She still has her IUD in place   Physical Examination: Vitals:   03/05/17 1630  BP: 118/78  Pulse: 85  Temp: 97.8 F (36.6 C)  SpO2: 98%   Vitals:   03/05/17 1630  Weight: 272 lb 12.8 oz (123.7 kg)  Height: 5' 6.5" (1.689 m)   Body mass index is 43.37 kg/m. Ideal Body Weight: Weight in (lb) to have BMI = 25: 156.9  GEN: WDWN, NAD, Non-toxic, A & O x 3, obese, looks well otherwise HEENT: Atraumatic, Normocephalic. Neck supple. No masses, No LAD. Ears and Nose: No external deformity. CV: RRR, No M/G/R. No JVD. No thrill. No extra heart sounds. PULM: CTA B, no wheezes, crackles, rhonchi. No retractions. No resp. distress. No accessory muscle use. ABD: S, NT, ND, +BS. No rebound. No HSM. EXTR: No c/c/e NEURO Normal gait.  PSYCH: Normally interactive. Conversant. Not depressed or anxious appearing.  Calm demeanor.    Assessment and Plan: Pseudotumor cerebri  Gastroesophageal reflux disease, esophagitis presence not specified - Plan: omeprazole (PRILOSEC) 20 MG capsule  Pre-diabetes - Plan: Comprehensive metabolic panel, Hemoglobin A1c  Medication monitoring encounter - Plan: CBC, Comprehensive metabolic panel  Screening for colon cancer  Screening for hyperlipidemia - Plan: Lipid panel  Screening for breast cancer - Plan: MM SCREENING BREAST TOMO BILATERAL  Essential hypertension - Plan: CBC, lisinopril-hydrochlorothiazide (PRINZIDE,ZESTORETIC) 20-25 MG tablet  Immunization due - Plan: Flu Vaccine QUAD 36+ mos IM (Fluarix & Fluzone Quad PF  Following up today Labs pending as above Flu shot BP is under good control, refill HCTZ Referral for mammo Ordered cologuard Will plan further follow- up pending labs.   Signed Lamar Blinks, MD  Received her labs 2/22  Results for orders placed or performed  in visit on 03/05/17  CBC  Result Value Ref Range   WBC 11.5 (H) 4.0 - 10.5 K/uL   RBC 4.85 3.87 - 5.11 Mil/uL   Platelets 302.0 R 150.0 - 400.0 K/uL   Hemoglobin 14.4 12.0 - 15.0 g/dL   HCT 43.1 36.0 - 46.0 %   MCV 88.8 78.0 - 100.0 fl   MCHC 33.4 30.0 - 36.0 g/dL   RDW 13.3 11.5 - 15.5 %  Comprehensive metabolic panel  Result Value Ref Range   Sodium 137 135 - 145 mEq/L   Potassium 3.3 (L) 3.5 - 5.1 mEq/L   Chloride 102 96 - 112 mEq/L   CO2 24 19 - 32 mEq/L   Glucose, Bld 92 70 - 99 mg/dL   BUN 19 6 - 23 mg/dL   Creatinine, Ser 0.90 0.40 - 1.20 mg/dL   Total Bilirubin 0.3 0.2 - 1.2 mg/dL   Alkaline Phosphatase 56 39 - 117 U/L  AST 18 0 - 37 U/L   ALT 14 0 - 35 U/L   Total Protein 7.8 6.0 - 8.3 g/dL   Albumin 4.4 3.5 - 5.2 g/dL   Calcium 9.1 8.4 - 10.5 mg/dL   GFR 70.06 >60.00 mL/min  Hemoglobin A1c  Result Value Ref Range   Hgb A1c MFr Bld 6.2 4.6 - 6.5 %  Lipid panel  Result Value Ref Range   Cholesterol 234 (H) 0 - 200 mg/dL   Triglycerides 104.0 0.0 - 149.0 mg/dL   HDL 53.10 >39.00 mg/dL   VLDL 20.8 0.0 - 40.0 mg/dL   LDL Cholesterol 161 (H) 0 - 99 mg/dL   Total CHOL/HDL Ratio 4    NonHDL 181.33

## 2017-03-05 ENCOUNTER — Ambulatory Visit: Payer: BLUE CROSS/BLUE SHIELD | Admitting: Family Medicine

## 2017-03-05 ENCOUNTER — Encounter: Payer: Self-pay | Admitting: Family Medicine

## 2017-03-05 VITALS — BP 118/78 | HR 85 | Temp 97.8°F | Ht 66.5 in | Wt 272.8 lb

## 2017-03-05 DIAGNOSIS — Z1211 Encounter for screening for malignant neoplasm of colon: Secondary | ICD-10-CM | POA: Diagnosis not present

## 2017-03-05 DIAGNOSIS — Z13220 Encounter for screening for lipoid disorders: Secondary | ICD-10-CM | POA: Diagnosis not present

## 2017-03-05 DIAGNOSIS — Z1239 Encounter for other screening for malignant neoplasm of breast: Secondary | ICD-10-CM

## 2017-03-05 DIAGNOSIS — Z1231 Encounter for screening mammogram for malignant neoplasm of breast: Secondary | ICD-10-CM | POA: Diagnosis not present

## 2017-03-05 DIAGNOSIS — Z23 Encounter for immunization: Secondary | ICD-10-CM

## 2017-03-05 DIAGNOSIS — R7303 Prediabetes: Secondary | ICD-10-CM | POA: Diagnosis not present

## 2017-03-05 DIAGNOSIS — I10 Essential (primary) hypertension: Secondary | ICD-10-CM | POA: Diagnosis not present

## 2017-03-05 DIAGNOSIS — G932 Benign intracranial hypertension: Secondary | ICD-10-CM | POA: Diagnosis not present

## 2017-03-05 DIAGNOSIS — Z5181 Encounter for therapeutic drug level monitoring: Secondary | ICD-10-CM | POA: Diagnosis not present

## 2017-03-05 DIAGNOSIS — K219 Gastro-esophageal reflux disease without esophagitis: Secondary | ICD-10-CM

## 2017-03-05 MED ORDER — LISINOPRIL-HYDROCHLOROTHIAZIDE 20-25 MG PO TABS: 1.0000 | ORAL_TABLET | Freq: Every day | ORAL | 3 refills | Status: DC

## 2017-03-05 MED ORDER — OMEPRAZOLE 20 MG PO CPDR: 20.0000 mg | DELAYED_RELEASE_CAPSULE | Freq: Every day | ORAL | 3 refills | Status: DC

## 2017-03-05 NOTE — Patient Instructions (Signed)
Good to see you - we will arrange for a mammogram for you, and will get you a cologuard kit delivered to you I will be in touch with your labs asap Take care!

## 2017-03-06 ENCOUNTER — Encounter: Payer: Self-pay | Admitting: Family Medicine

## 2017-03-06 DIAGNOSIS — E785 Hyperlipidemia, unspecified: Secondary | ICD-10-CM

## 2017-03-06 LAB — CBC
HCT: 43.1 % (ref 36.0–46.0)
Hemoglobin: 14.4 g/dL (ref 12.0–15.0)
MCHC: 33.4 g/dL (ref 30.0–36.0)
MCV: 88.8 fl (ref 78.0–100.0)
RBC: 4.85 Mil/uL (ref 3.87–5.11)
RDW: 13.3 % (ref 11.5–15.5)
WBC: 11.5 10*3/uL — ABNORMAL HIGH (ref 4.0–10.5)

## 2017-03-06 LAB — LIPID PANEL
CHOLESTEROL: 234 mg/dL — AB (ref 0–200)
HDL: 53.1 mg/dL (ref >39.00)
LDL Cholesterol: 161 mg/dL — ABNORMAL HIGH (ref 0–99)
NONHDL: 181.33
Total CHOL/HDL Ratio: 4
Triglycerides: 104 mg/dL (ref 0.0–149.0)
VLDL: 20.8 mg/dL (ref 0.0–40.0)

## 2017-03-06 LAB — COMPREHENSIVE METABOLIC PANEL
ALK PHOS: 56 U/L (ref 39–117)
ALT: 14 U/L (ref 0–35)
AST: 18 U/L (ref 0–37)
Albumin: 4.4 g/dL (ref 3.5–5.2)
BUN: 19 mg/dL (ref 6–23)
CALCIUM: 9.1 mg/dL (ref 8.4–10.5)
CO2: 24 mEq/L (ref 19–32)
CREATININE: 0.9 mg/dL (ref 0.40–1.20)
Chloride: 102 mEq/L (ref 96–112)
GFR: 70.06 mL/min (ref >60.00)
Glucose, Bld: 92 mg/dL (ref 70–99)
Potassium: 3.3 mEq/L — ABNORMAL LOW (ref 3.5–5.1)
SODIUM: 137 meq/L (ref 135–145)
Total Bilirubin: 0.3 mg/dL (ref 0.2–1.2)
Total Protein: 7.8 g/dL (ref 6.0–8.3)

## 2017-03-06 LAB — HEMOGLOBIN A1C: HEMOGLOBIN A1C: 6.2 % (ref 4.6–6.5)

## 2017-03-08 MED ORDER — PRAVASTATIN SODIUM 20 MG PO TABS: 20.0000 mg | ORAL_TABLET | Freq: Every day | ORAL | 3 refills | Status: DC

## 2017-03-27 ENCOUNTER — Telehealth: Payer: Self-pay | Admitting: Neurology

## 2017-03-27 NOTE — Telephone Encounter (Signed)
Notes from her eye doctor at [facility] received, no optic nerve swelling, optic nerves exactly like 1 year ago.

## 2017-05-06 ENCOUNTER — Telehealth: Payer: Self-pay | Admitting: *Deleted

## 2017-05-06 NOTE — Telephone Encounter (Signed)
Received Cologuard Order Cancellation: [id number]; order has been changed to Suspended for Inactivity, the order will be reactivated if patient returns their sample w/i 365 days of the Initial order. [employer] has made several attempts to reach patient by phone and letter unsuccessfully; forwarded to provider/SLS 04/24

## 2017-09-04 ENCOUNTER — Ambulatory Visit: Payer: Self-pay | Admitting: Neurology

## 2017-11-21 ENCOUNTER — Ambulatory Visit: Payer: BLUE CROSS/BLUE SHIELD | Admitting: Family Medicine

## 2017-11-21 ENCOUNTER — Encounter: Payer: Self-pay | Admitting: Family Medicine

## 2017-11-21 VITALS — BP 110/70 | HR 91 | Temp 98.0°F | Resp 18 | Ht 67.0 in | Wt 268.0 lb

## 2017-11-21 DIAGNOSIS — I10 Essential (primary) hypertension: Secondary | ICD-10-CM | POA: Diagnosis not present

## 2017-11-21 DIAGNOSIS — J0110 Acute frontal sinusitis, unspecified: Secondary | ICD-10-CM | POA: Diagnosis not present

## 2017-11-21 DIAGNOSIS — Z79899 Other long term (current) drug therapy: Secondary | ICD-10-CM

## 2017-11-21 MED ORDER — GUAIFENESIN-CODEINE 100-10 MG/5ML PO SOLN: 5.0000 mL | Freq: Four times a day (QID) | ORAL | 0 refills | Status: DC | PRN

## 2017-11-21 MED ORDER — AMOXICILLIN-POT CLAVULANATE 875-125 MG PO TABS: 1.0000 | ORAL_TABLET | Freq: Two times a day (BID) | ORAL | 0 refills | Status: AC

## 2017-11-21 NOTE — Patient Instructions (Signed)
Sinsusitis Viral based on <10 days, no double sickening, lack of severity of symptoms in first 3 days. Educated on signs that bacterial infection may have developed (symptoms over 10 days, double sickening)- we wrote a prescription for augmentin to have on hand if either of these occur. Also for symptomatic care of cough at night wrote codeine cough syrup and she knows not to drive for 8 hours after this is taken.   Finally, we reviewed reasons to return to care including if symptoms worsen or persist or new concerns arise (particularly fever or shortness of breath)

## 2017-11-21 NOTE — Progress Notes (Signed)
PCP: Darreld Mclean, MD  Subjective:  Janice Lucas is a 52 y.o. year old very pleasant female patient who presents with sinusitis symptoms including  Head congestion Sinus congestion Sinus pressure in bilateral frontal sinuses Some cough A few chills  She doesn't drive. The cough she has is bad enough at night that she is requesting something stronger than current otc options- she has tolerated codeine well in the past. Her spouse drives her- she states she doesn't drive -day of illness:Day 5 of illness -Symptoms show no change -previous treatments: Sudafed (I advised her against this given her hypertension history), corricidin HBP, alka seltzer plus, allergy relief nasal spray -sick contacts/travel/risks: denies flu exposure.   ROS-no fevers, fatigue/malaise, nausea/vomiting, or recent weight change. No body aches. No shortness of breath  Pertinent Past Medical History-  Patient Active Problem List   Diagnosis Date Noted  . Idiopathic intracranial hypertension 02/08/2016  . Migraine without aura and without status migrainosus, not intractable 02/08/2016  . Herniated nucleus pulposus, C6-7 right 02/14/2011    Medications- reviewed  Current Outpatient Medications  Medication Sig Dispense Refill  . acetaminophen (TYLENOL) 500 MG tablet Take 500 mg by mouth every 6 (six) hours as needed.    Marland Kitchen acetaZOLAMIDE (DIAMOX) 500 MG capsule Take 2 capsules (1,000 mg total) by mouth 2 (two) times daily. 360 capsule 3  . aspirin EC 81 MG tablet Take 81 mg by mouth daily.    . Diclofenac Sodium (PENNSAID) 2 % SOLN Place 2 Squirts onto the skin 2 (two) times daily as needed. 1 Bottle 1  . lisinopril-hydrochlorothiazide (PRINZIDE,ZESTORETIC) 20-25 MG tablet Take 1 tablet by mouth daily. 90 tablet 3  . nortriptyline (PAMELOR) 25 MG capsule Take 2 caps at bedtime 180 capsule 3  . omeprazole (PRILOSEC) 20 MG capsule Take 1 capsule (20 mg total) by mouth daily. 90 capsule 3  . potassium chloride  SA (K-DUR,KLOR-CON) 20 MEQ tablet Take 1 tablet (20 mEq total) by mouth daily. Take for 5 days.  Then stop pending repeat blood level 30 tablet 0  . pravastatin (PRAVACHOL) 20 MG tablet Take 1 tablet (20 mg total) by mouth daily. 90 tablet 3  . topiramate (TOPAMAX) 100 MG tablet Take 1 tablet (100 mg total) by mouth 2 (two) times daily. 180 tablet 3  . amoxicillin-clavulanate (AUGMENTIN) 875-125 MG tablet Take 1 tablet by mouth 2 (two) times daily for 7 days. 14 tablet 0  . guaiFENesin-codeine 100-10 MG/5ML syrup Take 5 mLs by mouth every 6 (six) hours as needed for cough. 120 mL 0   No current facility-administered medications for this visit.     Objective: BP 110/70 (BP Location: Left Arm, Patient Position: Sitting, Cuff Size: Large)   Pulse 91   Temp 98 F (36.7 C) (Oral)   Resp 18   Ht 5\' 7"  (1.702 m)   Wt 268 lb (121.6 kg)   SpO2 99%   BMI 41.97 kg/m  Gen: NAD, resting comfortably HEENT: Turbinates erythematous with clear drainage, TM normal, pharynx mildly erythematous with no tonsilar exudate or edema, frontal sinus tenderness CV: RRR no murmurs rubs or gallops Lungs: CTAB no crackles, wheeze, rhonchi Abdomen: obese  Ext: no edema Skin: warm, dry, no rash  Assessment/Plan:  Sinsusitis Viral based on <10 days, no double sickening, lack of severity of symptoms in first 3 days. Educated on signs that bacterial infection may have developed (symptoms over 10 days, double sickening)- we wrote a prescription for augmentin to have on hand if  either of these occur. Also for symptomatic care of cough at night wrote codeine cough syrup and she knows not to drive for 8 hours after this is taken.   Finally, we reviewed reasons to return to care including if symptoms worsen or persist or new concerns arise (particularly fever or shortness of breath)  Meds ordered this encounter  Medications  . amoxicillin-clavulanate (AUGMENTIN) 875-125 MG tablet    Sig: Take 1 tablet by mouth 2 (two)  times daily for 7 days.    Dispense:  14 tablet    Refill:  0  . guaiFENesin-codeine 100-10 MG/5ML syrup    Sig: Take 5 mLs by mouth every 6 (six) hours as needed for cough.    Dispense:  120 mL    Refill:  0    Garret Reddish, MD

## 2017-12-07 ENCOUNTER — Encounter

## 2017-12-07 ENCOUNTER — Other Ambulatory Visit: Payer: Self-pay

## 2017-12-07 ENCOUNTER — Ambulatory Visit: Payer: BLUE CROSS/BLUE SHIELD | Admitting: Neurology

## 2017-12-07 ENCOUNTER — Encounter: Payer: Self-pay | Admitting: Neurology

## 2017-12-07 VITALS — BP 106/68 | HR 92 | Ht 66.5 in | Wt 271.0 lb

## 2017-12-07 DIAGNOSIS — G932 Benign intracranial hypertension: Secondary | ICD-10-CM

## 2017-12-07 DIAGNOSIS — G43009 Migraine without aura, not intractable, without status migrainosus: Secondary | ICD-10-CM

## 2017-12-07 MED ORDER — NORTRIPTYLINE HCL 25 MG PO CAPS: ORAL_CAPSULE | ORAL | 3 refills | Status: DC

## 2017-12-07 MED ORDER — ACETAZOLAMIDE ER 500 MG PO CP12: 1000.0000 mg | ORAL_CAPSULE | Freq: Two times a day (BID) | ORAL | 3 refills | Status: DC

## 2017-12-07 MED ORDER — TOPIRAMATE 100 MG PO TABS: 100.0000 mg | ORAL_TABLET | Freq: Two times a day (BID) | ORAL | 3 refills | Status: DC

## 2017-12-07 NOTE — Progress Notes (Signed)
NEUROLOGY FOLLOW UP OFFICE NOTE  Janice Lucas [id number] 1965-09-06  HISTORY OF PRESENT ILLNESS: I had the pleasure of seeing Janice Lucas in follow-up in the neurology clinic on 12/07/2017.  The patient was last seen 9 months ago for pseudotumor cerebri and migraines. She has been doing well since her last visit. She reports headaches are pretty good, she takes Tylenol around once a week with good effect. She states she has not had any migraines. She is taking Topamax 100mg  BID, Diamox 1000mg  BID, and nortriptyline 50mg  qhs. She denies any side effects. She reports attempts in the past to reduce Diamox led to more headaches. She denies any dizziness, vision loss/vision changes, focal numbness/tingling/weakness. No falls. Sleep is good, she continues to use her CPAP. Eye doctor exam note from 03/2017 showed no optic nerve swelling, optic nerves same as year prior.  Laboratory Data:   Chemistry      Component Value Date/Time   NA 137 03/05/2017 1658   K 3.3 (L) 03/05/2017 1658   CL 102 03/05/2017 1658   CO2 24 03/05/2017 1658   BUN 19 03/05/2017 1658   CREATININE 0.90 03/05/2017 1658   CREATININE 1.01 07/25/2016 1629      Component Value Date/Time   CALCIUM 9.1 03/05/2017 1658   ALKPHOS 56 03/05/2017 1658   AST 18 03/05/2017 1658   ALT 14 03/05/2017 1658   BILITOT 0.3 03/05/2017 1658     History on Initial Assessment 02/08/2016: This is a pleasant 52 yo RH woman with a history of pseudotumor cerebri diagnosed in 2015. Records from [facility] were reviewed, she presented to her eye doctor for a routine exam in April 2015 with a 40-month history of headaches thinking she may need new glasses. She was noted to have mild papilledema, right > left. At that time, headaches were behind her eyes and at the vertex, daily, worsened by bending, lifting, coughing, and sneezing. MRI and MRV brain were reported as normal. She had a lumbar puncture with opening pressure of 25 in the  lateral position. They report problems with her LP, she had a post-spinal tap headache that initial blood patch did not help with. She was started on Topamax and Diamox, and nortriptyline was added for migraine prevention. She was doing very well on this regimen with rare headaches. Her neurologist Dr. Baltazar Najjar left the practice and she saw Dr. Ermalene Postin last October, with note of improvement in eye exam from last visit, no evidence of papilledema, and rare headaches. They discussed reducing some of her medications, Topamax was reduced to 100mg  daily. With reduction in Topamax, she noticed an increase in headaches, particularly in the morning. She then lost her insurance and was unable to obtain the Diamox. Without this, headaches became much worse. She describes them as diffuse sharp pain, with occasional photo and phonophobia when severe, no nausea/vomiting. Bending over causes floaters in her vision. She has intermittent tinnitus (non-pulsatile). She feels her vision is getting worse, she is straining more to use the computer with blurry vision, no loss of vision. No driving difficulties with peripheral vision.   She has been back on the Diamox after seeing her PCP, currently on uptitration taking 500mg  BID, then increasing back to original dose of 1000mg  BID. She had some paresthesias on the Diamox, and found that bananas help with this. She denies any dysarthria/dysphagia, neck/back pain, bowel/bladder dysfunction, dizziness, no falls. She reports some numbness in the three fingers of her right hand, they feel cold suddenly ("like  ice cubes") and hurt. 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   PAST MEDICAL HISTORY: Past Medical History:  Diagnosis Date  . Hypertension   . Sleep apnea     MEDICATIONS: Current Outpatient Medications on File Prior to Visit  Medication Sig Dispense Refill  . acetaminophen (TYLENOL) 500 MG tablet Take 500 mg by mouth every 6 (six)  hours as needed.    Marland Kitchen acetaZOLAMIDE (DIAMOX) 500 MG capsule Take 2 capsules (1,000 mg total) by mouth 2 (two) times daily. 360 capsule 3  . aspirin EC 81 MG tablet Take 81 mg by mouth daily.    . Diclofenac Sodium (PENNSAID) 2 % SOLN Place 2 Squirts onto the skin 2 (two) times daily as needed. 1 Bottle 1  . guaiFENesin-codeine 100-10 MG/5ML syrup Take 5 mLs by mouth every 6 (six) hours as needed for cough. 120 mL 0  . lisinopril-hydrochlorothiazide (PRINZIDE,ZESTORETIC) 20-25 MG tablet Take 1 tablet by mouth daily. 90 tablet 3  . nortriptyline (PAMELOR) 25 MG capsule Take 2 caps at bedtime 180 capsule 3  . omeprazole (PRILOSEC) 20 MG capsule Take 1 capsule (20 mg total) by mouth daily. 90 capsule 3  . potassium chloride SA (K-DUR,KLOR-CON) 20 MEQ tablet Take 1 tablet (20 mEq total) by mouth daily. Take for 5 days.  Then stop pending repeat blood level 30 tablet 0  . pravastatin (PRAVACHOL) 20 MG tablet Take 1 tablet (20 mg total) by mouth daily. 90 tablet 3  . topiramate (TOPAMAX) 100 MG tablet Take 1 tablet (100 mg total) by mouth 2 (two) times daily. 180 tablet 3   No current facility-administered medications on file prior to visit.     ALLERGIES: Allergies  Allergen Reactions  . Sulfa Antibiotics Other (See Comments)    Acts "goofy"  . Neosporin  [Neomycin-Bacitracin Zn-Polymyx] Rash    FAMILY HISTORY: Family History  Problem Relation Age of Onset  . Diabetes Maternal Aunt   . Diabetes Maternal Uncle     SOCIAL HISTORY: Social History   Socioeconomic History  . Marital status: Single    Spouse name: Not on file  . Number of children: Not on file  . Years of education: Not on file  . Highest education level: Not on file  Occupational History  . Not on file  Social Needs  . Financial resource strain: Not on file  . Food insecurity:    Worry: Not on file    Inability: Not on file  . Transportation needs:    Medical: Not on file    Non-medical: Not on file  Tobacco Use   . Smoking status: Former Smoker    Packs/day: 0.50    Types: Cigarettes  . Smokeless tobacco: Never Used  Substance and Sexual Activity  . Alcohol use: No    Comment: occassionally  . Drug use: Yes  . Sexual activity: Not on file  Lifestyle  . Physical activity:    Days per week: Not on file    Minutes per session: Not on file  . Stress: Not on file  Relationships  . Social connections:    Talks on phone: Not on file    Gets together: Not on file    Attends religious service: Not on file    Active member of club or organization: Not on file    Attends meetings of clubs or organizations: Not on file    Relationship status: Not on file  . Intimate partner violence:  Fear of current or ex partner: Not on file    Emotionally abused: Not on file    Physically abused: Not on file    Forced sexual activity: Not on file  Other Topics Concern  . Not on file  Social History Narrative  . Not on file    REVIEW OF SYSTEMS: Constitutional: No fevers, chills, or sweats, no generalized fatigue, change in appetite Eyes: No visual changes, double vision, eye pain Ear, nose and throat: No hearing loss, ear pain, nasal congestion, sore throat Cardiovascular: No chest pain, palpitations Respiratory:  No shortness of breath at rest or with exertion, wheezes GastrointestinaI: No nausea, vomiting, diarrhea, abdominal pain, fecal incontinence Genitourinary:  No dysuria, urinary retention or frequency Musculoskeletal:  No neck pain, back pain Integumentary: No rash, pruritus, skin lesions Neurological: as above Psychiatric: No depression, insomnia, anxiety Endocrine: No palpitations, fatigue, diaphoresis, mood swings, change in appetite, change in weight, increased thirst Hematologic/Lymphatic:  No anemia, purpura, petechiae. Allergic/Immunologic: no itchy/runny eyes, nasal congestion, recent allergic reactions, rashes  PHYSICAL EXAM: Vitals:   12/07/17 0837  BP: 106/68  Pulse: 92    SpO2: 98%   General: No acute distress Head:  Normocephalic/atraumatic Neck: supple, no paraspinal tenderness, full range of motion Heart:  Regular rate and rhythm Lungs:  Clear to auscultation bilaterally Back: No paraspinal tenderness Skin/Extremities: No rash, no edema Neurological Exam: alert and oriented to person, place, and time. No aphasia or dysarthria. Fund of knowledge is appropriate.  Recent and remote memory are intact.  Attention and concentration are normal.    Able to name objects and repeat phrases. Cranial nerves: Pupils equal, round, reactive to light.  Fundoscopic exam unremarkable, no papilledema. Extraocular movements intact with no nystagmus. Visual fields full. Facial sensation intact. No facial asymmetry. Tongue, uvula, palate midline.  Motor: Bulk and tone normal, muscle strength 5/5 throughout with no pronator drift.  Sensation to light touch intact.  No extinction to double simultaneous stimulation. Finger to nose testing intact.  Gait narrow-based and steady, able to tandem walk adequately.  Romberg negative.  IMPRESSION: This is a pleasant 52 yo RH woman with a history of pseudotumor cerebri diagnosed in 2015. Headaches were well-controlled on Diamox 1000mg  BID, Topamax 100mg , she is also on nortriptyline 50mg  qhs  for migraine prophylaxis. She reports doing well on current regimen. Prior attempts to wean Diamox caused increase in headaches. Continue to monitor BMP while on current medications. Refills sent. Continue regular eye exams. She knows to minimize Tylenol intake to 2-3 times a week to avoid rebound headaches. She will follow-up in 8 months and knows to call for any changes.  Thank you for allowing me to participate in her care.  Please do not hesitate to call for any questions or concerns.  The duration of this appointment visit was 15 minutes of face-to-face time with the patient.  Greater than 50% of this time was spent in counseling, explanation of  diagnosis, planning of further management, and coordination of care.   Ellouise Newer, M.D.   CC: Dr. Lorelei Pont

## 2017-12-07 NOTE — Patient Instructions (Addendum)
Great seeing you! Continue all your medications. Continue follow-up with your eye doctor regularly. Follow-up in 8 months, call for any changes.

## 2017-12-16 ENCOUNTER — Ambulatory Visit: Payer: BLUE CROSS/BLUE SHIELD | Admitting: Family Medicine

## 2017-12-16 ENCOUNTER — Encounter: Payer: Self-pay | Admitting: Family Medicine

## 2017-12-16 VITALS — BP 111/68 | HR 86 | Temp 98.3°F | Ht 67.0 in | Wt 270.0 lb

## 2017-12-16 DIAGNOSIS — J0111 Acute recurrent frontal sinusitis: Secondary | ICD-10-CM

## 2017-12-16 DIAGNOSIS — J209 Acute bronchitis, unspecified: Secondary | ICD-10-CM | POA: Diagnosis not present

## 2017-12-16 MED ORDER — CEFDINIR 300 MG PO CAPS: 300.0000 mg | ORAL_CAPSULE | Freq: Two times a day (BID) | ORAL | 0 refills | Status: DC

## 2017-12-16 MED ORDER — GUAIFENESIN-CODEINE 100-10 MG/5ML PO SOLN: 5.0000 mL | Freq: Four times a day (QID) | ORAL | 0 refills | Status: DC | PRN

## 2017-12-16 NOTE — Patient Instructions (Addendum)
It was good to see you today- I hope that you feel much better soon! We will try a course of omnicef twice a day for 10 days I also refilled your cough syrup Let me know if not improving

## 2017-12-16 NOTE — Progress Notes (Signed)
[facility] at [facility] [place], [place], [place] [address] [phone]  Date:  12/16/2017   Name:  Janice Lucas   DOB:  [birth year]   MRN:  [id number]  PCP:  Darreld Mclean, MD    Chief Complaint: Nasal Congestion (and head,cough(product-green and clear), and chills-sxs started again on Sunday-pt was dx with sinus infection, and bronchitis x 2 weeks ago)   History of Present Illness:  Janice Lucas is a 52 y.o. very pleasant female patient who presents with the following:  She is here today with illness- she has noted chest and head congestion for about a month-  On 11/9 she saw Dr. Yong Channel and was treated with augmentin and cough syrup She got better but then got sick again 5 days ago She has not noted a fever but may feel chilled She is going to work ok She has a cough which can be productive Throat is raw, as is her nose No GI symptoms  She uses cpap but cannot use the last few days, she is having to sleep in her recliner  She is using a cough med for HTN persons   She just recently saw her neurologist, Dr. Delice Lesch Per her most recent note: This is a pleasant 52 yo RH womanwith a history of pseudotumor cerebri diagnosed in 2015. Headaches were well-controlled on Diamox 1000mg  BID, Topamax 100mg , she is also on nortriptyline 50mg  qhs  for migraine prophylaxis. She reports doing well on current regimen. Prior attempts to wean Diamox caused increase in headaches. Continue to monitor BMP while on current medications. Refills sent. Continue regular eye exams. She knows to minimize Tylenol intake to 2-3 times a week to avoid rebound headaches. She will follow-up in 8 months and knows to call for any changes.  NCC[facility]: reviewed and ok  Patient Active Problem List   Diagnosis Date Noted  . Idiopathic intracranial hypertension 02/08/2016  . Migraine without aura and without status migrainosus, not intractable 02/08/2016  .  Herniated nucleus pulposus, C6-7 right 02/14/2011    Past Medical History:  Diagnosis Date  . Hypertension   . Sleep apnea     Past Surgical History:  Procedure Laterality Date  . ANTERIOR CERVICAL DECOMP/DISCECTOMY FUSION  02/14/2011   Procedure: ANTERIOR CERVICAL DECOMPRESSION/DISCECTOMY FUSION 1 LEVEL;  Surgeon: Earleen Newport, MD;  Location: [facility];  Service: Neurosurgery;  Laterality: N/A;  Anterior Cervical Six-Seven Decompression and Fusion    Social History   Tobacco Use  . Smoking status: Former Smoker    Packs/day: 0.50    Types: Cigarettes  . Smokeless tobacco: Never Used  Substance Use Topics  . Alcohol use: No    Comment: occassionally  . Drug use: Yes    Family History  Problem Relation Age of Onset  . Diabetes Maternal Aunt   . Diabetes Maternal Uncle     Allergies  Allergen Reactions  . Sulfa Antibiotics Other (See Comments)    Acts "goofy"  . Neosporin  [Neomycin-Bacitracin Zn-Polymyx] Rash    Medication list has been reviewed and updated.  Current Outpatient Medications on File Prior to Visit  Medication Sig Dispense Refill  . acetaminophen (TYLENOL) 500 MG tablet Take 500 mg by mouth every 6 (six) hours as needed.    Marland Kitchen acetaZOLAMIDE (DIAMOX) 500 MG capsule Take 2 capsules (1,000 mg total) by mouth 2 (two) times daily. 360 capsule 3  . aspirin EC 81 MG tablet  Take 81 mg by mouth daily.    Marland Kitchen lisinopril-hydrochlorothiazide (PRINZIDE,ZESTORETIC) 20-25 MG tablet Take 1 tablet by mouth daily. 90 tablet 3  . nortriptyline (PAMELOR) 25 MG capsule Take 2 caps at bedtime 180 capsule 3  . omeprazole (PRILOSEC) 20 MG capsule Take 1 capsule (20 mg total) by mouth daily. 90 capsule 3  . potassium chloride SA (K-DUR,KLOR-CON) 20 MEQ tablet Take 1 tablet (20 mEq total) by mouth daily. Take for 5 days.  Then stop pending repeat blood level 30 tablet 0  . pravastatin (PRAVACHOL) 20 MG tablet Take 1 tablet (20 mg total) by mouth daily. 90 tablet 3  .  topiramate (TOPAMAX) 100 MG tablet Take 1 tablet (100 mg total) by mouth 2 (two) times daily. 180 tablet 3  . guaiFENesin-codeine 100-10 MG/5ML syrup Take 5 mLs by mouth every 6 (six) hours as needed for cough. (Patient not taking: Reported on 12/16/2017) 120 mL 0   No current facility-administered medications on file prior to visit.     Review of Systems:  As per HPI- otherwise negative. No vomiting No rash    Physical Examination: Vitals:   12/16/17 1437  BP: 111/68  Pulse: 86  Temp: 98.3 F (36.8 C)  SpO2: 100%   Vitals:   12/16/17 1437  Weight: 270 lb (122.5 kg)  Height: 5\' 7"  (1.702 m)   Body mass index is 42.29 kg/m. Ideal Body Weight: Weight in (lb) to have BMI = 25: 159.3  GEN: WDWN, NAD, Non-toxic, A & O x 3,obese, looks well  HEENT: Atraumatic, Normocephalic. Neck supple. No masses, No LAD.  Bilateral TM wnl, oropharynx normal.  PEERL,EOMI.  Nasal cavity is irritated and inflamed  Ears and Nose: No external deformity. CV: RRR, No M/G/R. No JVD. No thrill. No extra heart sounds. PULM: CTA B, no wheezes, crackles, rhonchi. No retractions. No resp. distress. No accessory muscle use. EXTR: No c/c/e NEURO Normal gait.  PSYCH: Normally interactive. Conversant. Not depressed or anxious appearing.  Calm demeanor.    Assessment and Plan: Acute bronchitis, unspecified organism - Plan: guaiFENesin-codeine 100-10 MG/5ML syrup  Acute recurrent frontal sinusitis - Plan: cefdinir (OMNICEF) 300 MG capsule  Recurrent bronchitis/ sinusitis sx Treat with omnicef 300 BID-use a probiotic OTC  Refilled her cough syrup to use prn She will let me know if not feeling better soon   Signed Lamar Blinks, MD

## 2018-01-01 ENCOUNTER — Encounter: Payer: Self-pay | Admitting: Family Medicine

## 2018-01-04 ENCOUNTER — Ambulatory Visit (INDEPENDENT_AMBULATORY_CARE_PROVIDER_SITE_OTHER): Payer: BLUE CROSS/BLUE SHIELD

## 2018-01-04 DIAGNOSIS — Z23 Encounter for immunization: Secondary | ICD-10-CM

## 2018-01-28 DIAGNOSIS — G4733 Obstructive sleep apnea (adult) (pediatric): Secondary | ICD-10-CM | POA: Diagnosis not present

## 2018-01-28 DIAGNOSIS — I10 Essential (primary) hypertension: Secondary | ICD-10-CM | POA: Diagnosis not present

## 2018-01-28 DIAGNOSIS — J3089 Other allergic rhinitis: Secondary | ICD-10-CM | POA: Diagnosis not present

## 2018-01-28 DIAGNOSIS — E668 Other obesity: Secondary | ICD-10-CM | POA: Diagnosis not present

## 2018-02-15 DIAGNOSIS — G4733 Obstructive sleep apnea (adult) (pediatric): Secondary | ICD-10-CM | POA: Diagnosis not present

## 2018-03-15 ENCOUNTER — Other Ambulatory Visit: Payer: Self-pay | Admitting: Family Medicine

## 2018-03-15 DIAGNOSIS — I10 Essential (primary) hypertension: Secondary | ICD-10-CM

## 2018-03-16 DIAGNOSIS — G4733 Obstructive sleep apnea (adult) (pediatric): Secondary | ICD-10-CM | POA: Diagnosis not present

## 2018-04-16 DIAGNOSIS — G4733 Obstructive sleep apnea (adult) (pediatric): Secondary | ICD-10-CM | POA: Diagnosis not present

## 2018-04-21 ENCOUNTER — Encounter

## 2018-04-21 ENCOUNTER — Ambulatory Visit: Payer: Self-pay | Admitting: Neurology

## 2018-06-01 ENCOUNTER — Other Ambulatory Visit: Payer: Self-pay | Admitting: Family Medicine

## 2018-06-01 DIAGNOSIS — E785 Hyperlipidemia, unspecified: Secondary | ICD-10-CM

## 2018-06-07 ENCOUNTER — Other Ambulatory Visit: Payer: Self-pay | Admitting: Family Medicine

## 2018-06-07 DIAGNOSIS — K219 Gastro-esophageal reflux disease without esophagitis: Secondary | ICD-10-CM

## 2018-06-15 DIAGNOSIS — E669 Obesity, unspecified: Secondary | ICD-10-CM | POA: Diagnosis not present

## 2018-06-15 DIAGNOSIS — J309 Allergic rhinitis, unspecified: Secondary | ICD-10-CM | POA: Diagnosis not present

## 2018-06-15 DIAGNOSIS — G4733 Obstructive sleep apnea (adult) (pediatric): Secondary | ICD-10-CM | POA: Diagnosis not present

## 2018-06-15 DIAGNOSIS — I10 Essential (primary) hypertension: Secondary | ICD-10-CM | POA: Diagnosis not present

## 2018-06-18 ENCOUNTER — Encounter: Payer: Self-pay | Admitting: Neurology

## 2018-06-18 LAB — HM DIABETES EYE EXAM

## 2018-06-23 ENCOUNTER — Telehealth: Payer: Self-pay | Admitting: Neurology

## 2018-06-23 NOTE — Telephone Encounter (Signed)
Fax received from her [place] for exam on 06/18/2018:  No optic nerve edema. There was mild stage I hypertensive retinopathy on left.

## 2018-07-05 ENCOUNTER — Telehealth (INDEPENDENT_AMBULATORY_CARE_PROVIDER_SITE_OTHER): Payer: BC Managed Care – PPO | Admitting: Neurology

## 2018-07-05 ENCOUNTER — Other Ambulatory Visit: Payer: Self-pay

## 2018-07-05 VITALS — Ht 67.0 in | Wt 249.0 lb

## 2018-07-05 DIAGNOSIS — G932 Benign intracranial hypertension: Secondary | ICD-10-CM

## 2018-07-05 DIAGNOSIS — G43009 Migraine without aura, not intractable, without status migrainosus: Secondary | ICD-10-CM

## 2018-07-05 DIAGNOSIS — I10 Essential (primary) hypertension: Secondary | ICD-10-CM | POA: Insufficient documentation

## 2018-07-05 MED ORDER — ACETAZOLAMIDE ER 500 MG PO CP12: 1000.0000 mg | ORAL_CAPSULE | Freq: Two times a day (BID) | ORAL | 3 refills | Status: DC

## 2018-07-05 MED ORDER — TOPIRAMATE 100 MG PO TABS: 100.0000 mg | ORAL_TABLET | Freq: Two times a day (BID) | ORAL | 3 refills | Status: DC

## 2018-07-05 MED ORDER — NORTRIPTYLINE HCL 25 MG PO CAPS: ORAL_CAPSULE | ORAL | 3 refills | Status: DC

## 2018-07-05 NOTE — Progress Notes (Signed)
Virtual Visit via Video Note The purpose of this virtual visit is to provide medical care while limiting exposure to the novel coronavirus.    Consent was obtained for video visit:  Yes.   Answered questions that patient had about telehealth interaction:  Yes.   I discussed the limitations, risks, security and privacy concerns of performing an evaluation and management service by telemedicine. I also discussed with the patient that there may be a patient responsible charge related to this service. The patient expressed understanding and agreed to proceed.  Pt location: Home Physician Location: office Name of referring provider:  Copland, Gay Filler, MD I connected with Sherald Hess at patients initiation/request on 07/05/2018 at  9:30 AM EDT by video enabled telemedicine application and verified that I am speaking with the correct person using two identifiers. Pt MRN:  [id number] Pt DOB:  [birth year] Video Participants:  Sherald Hess   History of Present Illness:  The patient was last seen in November 2019 for pseudotumor cerebri and migraines. She is on Topiramate 100mg  BID, Diamox 1000mg  BID, and nortriptyline 50mg  qhs. Attempts in the past to reduce Diamox led to more headaches. She was last seen by her eye doctor on 06/18/2018, no optic nerve edema seen, there was mild stage I hypertensive retinopathy on the left eye. She reports that over the past month, her headaches have increased in frequency occurring 3-4 days a week. She was previously down to headaches once a week. With more severe headaches, there is pressure, nausea, photo/phonosensitivity. She takes prn Tylenol. Sleep is good with her CPAP machine. 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 She denies any side effects on medications. No dizziness, diplopia, focal numbness/tingling/weakness, no falls.   History on Initial Assessment 02/08/2016: This is a pleasant 53 yo RH woman with a history of pseudotumor  cerebri diagnosed in 2015. Records from [facility] were reviewed, she presented to her eye doctor for a routine exam in April 2015 with a 37-month history of headaches thinking she may need new glasses. She was noted to have mild papilledema, right > left. At that time, headaches were behind her eyes and at the vertex, daily, worsened by bending, lifting, coughing, and sneezing. MRI and MRV brain were reported as normal. She had a lumbar puncture with opening pressure of 25 in the lateral position. They report problems with her LP, she had a post-spinal tap headache that initial blood patch did not help with. She was started on Topamax and Diamox, and nortriptyline was added for migraine prevention. She was doing very well on this regimen with rare headaches. Her neurologist Dr. Baltazar Najjar left the practice and she saw Dr. Ermalene Postin last October, with note of improvement in eye exam from last visit, no evidence of papilledema, and rare headaches. They discussed reducing some of her medications, Topamax was reduced to 100mg  daily. With reduction in Topamax, she noticed an increase in headaches, particularly in the morning. She then lost her insurance and was unable to obtain the Diamox. Without this, headaches became much worse. She describes them as diffuse sharp pain, with occasional photo and phonophobia when severe, no nausea/vomiting. Bending over causes floaters in her vision. She has intermittent tinnitus (non-pulsatile). She feels her vision is getting worse, she is straining more to use the computer with blurry vision, no loss of vision. No driving difficulties with peripheral vision.   She has been back on the Diamox after seeing her PCP, currently on  uptitration taking 500mg  BID, then increasing back to original dose of 1000mg  BID. She had some paresthesias on the Diamox, and found that bananas help with this. She denies any dysarthria/dysphagia, neck/back pain, bowel/bladder dysfunction, dizziness, no  falls. She reports some numbness in the three fingers of her right hand, they feel cold suddenly ("like ice cubes") and hurt. 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      Current Outpatient Medications on File Prior to Visit  Medication Sig Dispense Refill  . acetaminophen (TYLENOL) 500 MG tablet Take 500 mg by mouth every 6 (six) hours as needed.    Marland Kitchen acetaZOLAMIDE (DIAMOX) 500 MG capsule Take 2 capsules (1,000 mg total) by mouth 2 (two) times daily. 360 capsule 3  . Ascorbic Acid (VITAMIN C) 1000 MG tablet Take 1,000 mg by mouth daily.    Marland Kitchen aspirin EC 81 MG tablet Take 81 mg by mouth daily.    . cholecalciferol (VITAMIN D3) 25 MCG (1000 UT) tablet Take 1,000 Units by mouth daily.    Marland Kitchen lisinopril-hydrochlorothiazide (PRINZIDE,ZESTORETIC) 20-25 MG tablet TAKE 1 TABLET BY MOUTH EVERY DAY 90 tablet 1  . nortriptyline (PAMELOR) 25 MG capsule Take 2 caps at bedtime 180 capsule 3  . omeprazole (PRILOSEC) 20 MG capsule TAKE 1 CAPSULE BY MOUTH DAILY 90 capsule 0  . potassium chloride SA (K-DUR,KLOR-CON) 20 MEQ tablet Take 1 tablet (20 mEq total) by mouth daily. Take for 5 days.  Then stop pending repeat blood level 30 tablet 0  . pravastatin (PRAVACHOL) 20 MG tablet TAKE 1 TABLET BY MOUTH EACH DAY FOR CHOLESTEROL 90 tablet 0  . topiramate (TOPAMAX) 100 MG tablet Take 1 tablet (100 mg total) by mouth 2 (two) times daily. 180 tablet 3   No current facility-administered medications on file prior to visit.      Observations/Objective:   Vitals:   07/05/18 0852  Weight: 249 lb (112.9 kg)  Height: 5\' 7"  (1.702 m)   GEN:  The patient appears stated age and is in NAD.  Neurological examination: Patient is awake, alert, oriented x 3. No aphasia or dysarthria. Intact fluency and comprehension. Remote and recent memory intact. Able to name and repeat. Cranial nerves: Extraocular movements intact with no nystagmus. No facial asymmetry. Motor: moves all  extremities symmetrically, at least anti-gravity x 4. No incoordination on finger to nose testing. Gait: narrow-based and steady, able to tandem walk adequately. Negative Romberg test.  Assessment and Plan:   This is a pleasant 53 yo RH woman with a history of migraines and pseudotumor cerebri diagnosed in 2015. Prior attempts to wean Diamox caused increase in headaches. She is reporting an increase in headaches the past month, no vision changes, recent eye exam normal. Increase nortriptyline to 75mg  qhs, she will call our office for an update in 2 weeks, we may uptitrate further if needed. Continue Diamox 1000mg  BID and Topiramate 100mg  BID, refills sent. Continue regular eye exams. She knows to minimize Tylenol intake to 2-3 times a week to avoid rebound headaches. She will follow-up in 6 months and knows to call for any changes.   Follow Up Instructions:    -I discussed the assessment and treatment plan with the patient. The patient was provided an opportunity to ask questions and all were answered. The patient agreed with the plan and demonstrated an understanding of the instructions.   The patient was advised to call back or seek an in-person evaluation if the symptoms worsen or  if the condition fails to improve as anticipated.     Cameron Sprang, MD
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
## 2018-07-07 ENCOUNTER — Encounter: Payer: Self-pay | Admitting: Family Medicine

## 2018-07-07 ENCOUNTER — Other Ambulatory Visit: Payer: Self-pay

## 2018-07-07 ENCOUNTER — Ambulatory Visit (INDEPENDENT_AMBULATORY_CARE_PROVIDER_SITE_OTHER): Payer: BC Managed Care – PPO | Admitting: Family Medicine

## 2018-07-07 DIAGNOSIS — Z1329 Encounter for screening for other suspected endocrine disorder: Secondary | ICD-10-CM

## 2018-07-07 DIAGNOSIS — Z130 Encounter for screening for diseases of the blood and blood-forming organs and certain disorders involving the immune mechanism: Secondary | ICD-10-CM

## 2018-07-07 DIAGNOSIS — Z1239 Encounter for other screening for malignant neoplasm of breast: Secondary | ICD-10-CM

## 2018-07-07 DIAGNOSIS — E785 Hyperlipidemia, unspecified: Secondary | ICD-10-CM | POA: Diagnosis not present

## 2018-07-07 DIAGNOSIS — Z131 Encounter for screening for diabetes mellitus: Secondary | ICD-10-CM | POA: Diagnosis not present

## 2018-07-07 DIAGNOSIS — I10 Essential (primary) hypertension: Secondary | ICD-10-CM

## 2018-07-07 DIAGNOSIS — Z30431 Encounter for routine checking of intrauterine contraceptive device: Secondary | ICD-10-CM

## 2018-07-07 DIAGNOSIS — Z114 Encounter for screening for human immunodeficiency virus [HIV]: Secondary | ICD-10-CM

## 2018-07-07 MED ORDER — PRAVASTATIN SODIUM 20 MG PO TABS: ORAL_TABLET | ORAL | 3 refills | Status: DC

## 2018-07-07 MED ORDER — LISINOPRIL-HYDROCHLOROTHIAZIDE 20-25 MG PO TABS: 1.0000 | ORAL_TABLET | Freq: Every day | ORAL | 3 refills | Status: DC

## 2018-07-13 ENCOUNTER — Encounter (HOSPITAL_BASED_OUTPATIENT_CLINIC_OR_DEPARTMENT_OTHER): Payer: Self-pay

## 2018-07-13 ENCOUNTER — Other Ambulatory Visit: Payer: Self-pay

## 2018-07-13 ENCOUNTER — Ambulatory Visit (HOSPITAL_BASED_OUTPATIENT_CLINIC_OR_DEPARTMENT_OTHER)
Admission: RE | Admit: 2018-07-13 | Discharge: 2018-07-13 | Disposition: A | Discharge status: Home / Self Care | Payer: BC Managed Care – PPO | Source: Ambulatory Visit | Attending: Family Medicine | Admitting: Family Medicine

## 2018-07-13 ENCOUNTER — Other Ambulatory Visit (INDEPENDENT_AMBULATORY_CARE_PROVIDER_SITE_OTHER): Payer: BC Managed Care – PPO

## 2018-07-13 DIAGNOSIS — Z1231 Encounter for screening mammogram for malignant neoplasm of breast: Secondary | ICD-10-CM | POA: Diagnosis not present

## 2018-07-13 DIAGNOSIS — E785 Hyperlipidemia, unspecified: Secondary | ICD-10-CM

## 2018-07-13 DIAGNOSIS — Z1329 Encounter for screening for other suspected endocrine disorder: Secondary | ICD-10-CM | POA: Diagnosis not present

## 2018-07-13 DIAGNOSIS — Z114 Encounter for screening for human immunodeficiency virus [HIV]: Secondary | ICD-10-CM

## 2018-07-13 DIAGNOSIS — Z1239 Encounter for other screening for malignant neoplasm of breast: Secondary | ICD-10-CM

## 2018-07-13 DIAGNOSIS — Z130 Encounter for screening for diseases of the blood and blood-forming organs and certain disorders involving the immune mechanism: Secondary | ICD-10-CM | POA: Diagnosis not present

## 2018-07-13 DIAGNOSIS — Z131 Encounter for screening for diabetes mellitus: Secondary | ICD-10-CM | POA: Diagnosis not present

## 2018-07-14 ENCOUNTER — Encounter: Payer: Self-pay | Admitting: Family Medicine

## 2018-07-14 LAB — LIPID PANEL
Cholesterol: 201 mg/dL — ABNORMAL HIGH (ref 0–200)
HDL: 55.1 mg/dL (ref >39.00)
LDL Cholesterol: 129 mg/dL — ABNORMAL HIGH (ref 0–99)
NonHDL: 146.35
Total CHOL/HDL Ratio: 4
Triglycerides: 87 mg/dL (ref 0.0–149.0)
VLDL: 17.4 mg/dL (ref 0.0–40.0)

## 2018-07-14 LAB — COMPREHENSIVE METABOLIC PANEL
ALT: 13 U/L (ref 0–35)
AST: 13 U/L (ref 0–37)
Albumin: 4.4 g/dL (ref 3.5–5.2)
Alkaline Phosphatase: 57 U/L (ref 39–117)
BUN: 17 mg/dL (ref 6–23)
CO2: 25 mEq/L (ref 19–32)
Calcium: 9 mg/dL (ref 8.4–10.5)
Chloride: 104 mEq/L (ref 96–112)
Creatinine, Ser: 0.93 mg/dL (ref 0.40–1.20)
GFR: 63.13 mL/min (ref >60.00)
Glucose, Bld: 78 mg/dL (ref 70–99)
Potassium: 3.5 mEq/L (ref 3.5–5.1)
Sodium: 140 mEq/L (ref 135–145)
Total Bilirubin: 0.4 mg/dL (ref 0.2–1.2)
Total Protein: 7.2 g/dL (ref 6.0–8.3)

## 2018-07-14 LAB — HEMOGLOBIN A1C: Hgb A1c MFr Bld: 6.2 % (ref 4.6–6.5)

## 2018-07-14 LAB — HIV ANTIBODY (ROUTINE TESTING W REFLEX): HIV 1&2 Ab, 4th Generation: NONREACTIVE

## 2018-07-14 LAB — CBC
HCT: 40.8 % (ref 36.0–46.0)
Hemoglobin: 13.4 g/dL (ref 12.0–15.0)
MCHC: 32.8 g/dL (ref 30.0–36.0)
MCV: 90.7 fl (ref 78.0–100.0)
Platelets: 282 10*3/uL (ref 150.0–400.0)
RBC: 4.5 Mil/uL (ref 3.87–5.11)
RDW: 14 % (ref 11.5–15.5)
WBC: 10.2 10*3/uL (ref 4.0–10.5)

## 2018-07-14 LAB — TSH: TSH: 1.87 u[IU]/mL (ref 0.35–4.50)

## 2018-08-13 ENCOUNTER — Telehealth: Payer: Self-pay | Admitting: Neurology

## 2018-08-13 DIAGNOSIS — G43009 Migraine without aura, not intractable, without status migrainosus: Secondary | ICD-10-CM

## 2018-08-13 MED ORDER — NORTRIPTYLINE HCL 25 MG PO CAPS: ORAL_CAPSULE | ORAL | 3 refills | Status: DC

## 2018-08-13 NOTE — Telephone Encounter (Signed)
Patient called and said recent increase in med dosage is working better for her. Sent Nortriptyline 25mg  (take 3 tabs at bedtime) prescription to [facility]. Patient called and made aware.

## 2018-08-13 NOTE — Telephone Encounter (Signed)
Patient calling in about the nortriptyline medication that was increased recently to 3 pills; it is working better for her. She is wanting new prescription sent to pharm on file. Thanks!

## 2018-08-16 NOTE — Telephone Encounter (Signed)
Noted, thanks!

## 2018-08-18 DIAGNOSIS — Z01419 Encounter for gynecological examination (general) (routine) without abnormal findings: Secondary | ICD-10-CM | POA: Diagnosis not present

## 2018-08-18 DIAGNOSIS — N951 Menopausal and female climacteric states: Secondary | ICD-10-CM | POA: Diagnosis not present

## 2018-08-18 DIAGNOSIS — Z780 Asymptomatic menopausal state: Secondary | ICD-10-CM | POA: Diagnosis not present

## 2018-08-18 DIAGNOSIS — Z975 Presence of (intrauterine) contraceptive device: Secondary | ICD-10-CM | POA: Diagnosis not present

## 2018-08-18 DIAGNOSIS — Z124 Encounter for screening for malignant neoplasm of cervix: Secondary | ICD-10-CM | POA: Diagnosis not present

## 2018-08-18 DIAGNOSIS — E2839 Other primary ovarian failure: Secondary | ICD-10-CM | POA: Diagnosis not present

## 2018-09-02 ENCOUNTER — Other Ambulatory Visit: Payer: Self-pay

## 2018-09-02 ENCOUNTER — Ambulatory Visit: Payer: BC Managed Care – PPO | Admitting: Family Medicine

## 2018-09-02 ENCOUNTER — Encounter: Payer: Self-pay | Admitting: Family Medicine

## 2018-09-02 VITALS — BP 112/72 | HR 98 | Temp 96.8°F | Resp 18 | Ht 67.0 in | Wt 265.0 lb

## 2018-09-02 DIAGNOSIS — S8391XA Sprain of unspecified site of right knee, initial encounter: Secondary | ICD-10-CM | POA: Diagnosis not present

## 2018-09-02 DIAGNOSIS — Z23 Encounter for immunization: Secondary | ICD-10-CM | POA: Diagnosis not present
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
## 2018-09-02 NOTE — Patient Instructions (Signed)
It was good to see you today!   You got your flu shot Use tylenol OTC- never more than package instructions- as needed for your knee pain You might also try elevation, ice, and compression (ace wrap)  Please let me know how your knee is doing on Monday- however reach out to me sooner if you think you definitely need to see ortho next week or if getting worse  If your knee is not improving we will seek an orthopedic consultation

## 2018-09-18 ENCOUNTER — Other Ambulatory Visit: Payer: Self-pay | Admitting: Family Medicine

## 2018-09-18 DIAGNOSIS — K219 Gastro-esophageal reflux disease without esophagitis: Secondary | ICD-10-CM

## 2018-10-15 ENCOUNTER — Encounter: Payer: Self-pay | Admitting: Family Medicine

## 2018-10-15 ENCOUNTER — Ambulatory Visit (INDEPENDENT_AMBULATORY_CARE_PROVIDER_SITE_OTHER): Payer: BC Managed Care – PPO | Admitting: Family Medicine

## 2018-10-15 ENCOUNTER — Telehealth: Payer: Self-pay

## 2018-10-15 ENCOUNTER — Other Ambulatory Visit: Payer: Self-pay

## 2018-10-15 DIAGNOSIS — J208 Acute bronchitis due to other specified organisms: Secondary | ICD-10-CM | POA: Diagnosis not present

## 2018-10-15 DIAGNOSIS — B9689 Other specified bacterial agents as the cause of diseases classified elsewhere: Secondary | ICD-10-CM

## 2018-10-15 MED ORDER — DOXYCYCLINE HYCLATE 100 MG PO TABS: 100.0000 mg | ORAL_TABLET | Freq: Two times a day (BID) | ORAL | 0 refills | Status: DC

## 2018-10-15 MED ORDER — HYDROCODONE-HOMATROPINE 5-1.5 MG/5ML PO SYRP: 5.0000 mL | ORAL_SOLUTION | Freq: Three times a day (TID) | ORAL | 0 refills | Status: DC | PRN

## 2018-10-15 NOTE — Telephone Encounter (Signed)
Copied from [place] [id number]. Topic: Appointment Scheduling - Scheduling Inquiry for Clinic >> Oct 15, 2018 10:06 AM Celene Kras A wrote: Reason for CRM: Pts wife called stating pt is expereicing a sore throat and a cough. Pts wife is requesting to have pt set up with a virtual visit today. Please advise.

## 2018-10-15 NOTE — Telephone Encounter (Signed)
I have scheduled patient for virtual visit with Dr. Nani Ravens today at 10:45.

## 2018-10-15 NOTE — Progress Notes (Signed)
Chief Complaint  Patient presents with  . Sore Throat  . Cough    Janice Lucas here for URI complaints. Due to COVID-19 pandemic, we are interacting via web portal for an electronic face-to-face visit. I verified patient's ID using 2 identifiers. Patient agreed to proceed with visit via this method. Patient is at home, I am at office. Patient and I are present for visit.   Duration: 1 day  Associated symptoms: sinus congestion, sinus pain, rhinorrhea, ear pain, sore throat and cough Denies: itchy watery eyes, ear fullness, ear drainage, wheezing, shortness of breath, myalgia and fevers Treatment to date: Mucinex DM Sick contacts: Yes - spouse had same thing, received doxy and improved.   ROS:  Const: Denies fevers HEENT: As noted in HPI Lungs: No SOB  Past Medical History:  Diagnosis Date  . Hypertension   . Sleep apnea    Exam No conversational dyspnea Age appropriate judgment and insight Nml affect and mood  Acute bacterial bronchitis - Plan: doxycycline (VIBRA-TABS) 100 MG tablet, HYDROcodone-homatropine (HYCODAN) 5-1.5 MG/5ML syrup  Will tx given sick contact. Offered covid screening, but declined at this time.  Continue to push fluids, practice good hand hygiene, cover mouth when coughing. Warnings about syrup discussed, she has been on it before.  F/u prn. If starting to experience fevers, shaking, or shortness of breath, seek immediate care. Pt voiced understanding and agreement to the plan.  [place], DO 10/15/18 10:58 AM

## 2018-11-01 ENCOUNTER — Telehealth: Payer: Self-pay

## 2018-11-01 NOTE — Telephone Encounter (Signed)
She might want to take up the offer to be screened for covid. Ibuprofen for side pain from coughing. Does she need more syrup? We could be dealing with a post-infectious cough. I don't think her symptoms will allow her in our office in person without a negative test at this time, as I would love to be able to listen to her lungs right now. Ty.

## 2018-11-01 NOTE — Telephone Encounter (Signed)
Copied from [place] [id number]. Topic: General - Inquiry >> Nov 01, 2018 11:43 AM Percell Belt A wrote: Reason for CRM:  pt called in and stated that her side is hurting , she is still coughing and she is having Diarrhea. She would like to know what she needs to do.  She just finished up her antibiotics last Monday    Best number  [phone]

## 2018-11-03 ENCOUNTER — Ambulatory Visit (INDEPENDENT_AMBULATORY_CARE_PROVIDER_SITE_OTHER): Payer: BC Managed Care – PPO | Admitting: Family Medicine

## 2018-11-03 ENCOUNTER — Other Ambulatory Visit: Payer: Self-pay

## 2018-11-03 ENCOUNTER — Encounter: Payer: Self-pay | Admitting: Family Medicine

## 2018-11-03 DIAGNOSIS — R1032 Left lower quadrant pain: Secondary | ICD-10-CM | POA: Diagnosis not present

## 2018-11-03 DIAGNOSIS — J208 Acute bronchitis due to other specified organisms: Secondary | ICD-10-CM | POA: Diagnosis not present

## 2018-11-03 DIAGNOSIS — B9689 Other specified bacterial agents as the cause of diseases classified elsewhere: Secondary | ICD-10-CM | POA: Diagnosis not present

## 2018-11-03 MED ORDER — HYDROCODONE-HOMATROPINE 5-1.5 MG/5ML PO SYRP: 5.0000 mL | ORAL_SOLUTION | Freq: Three times a day (TID) | ORAL | 0 refills | Status: DC | PRN

## 2018-11-03 NOTE — Progress Notes (Signed)
[facility] [address] [place], [place] [address] [phone]  Date:  11/03/2018   Name:  Janice Lucas   DOB:  [birth year]   MRN:  [id number]  PCP:  Darreld Mclean, MD    Chief Complaint: No chief complaint on file.   History of Present Illness:  Janice Lucas is a 53 y.o. very pleasant female patient who presents with the following:  Virtual visit today for concern of illness  Pt location is home, provider is at office Pt ID confirmed with 2 factors, she gives consent for virtual visit today   She had a virtual visit with Dr Nani Ravens on 10/2- given doxy for bronchitis.  She declined covid testing at that time  Pt with history of HTN, migraine HA, hyperlipidemia, intracranial hypertension/ pseudotumor cerebri dx in 2015, pre-diabetes   Last seen by myself in June  She notes that she is still coughing some, gradually improving Her sinuses are better No SOB She will get a tickle in her throat and then will cough She will bring up some white mucus on occasion No fever  She states that she actually feels ok except for her stomach- she feels like her stomach is churning She has noted some diarrhea- now resolved She may get some sharp pains in her left side-she indicates the left lower quadrant No vomiting She is able to eat pretty well but is not that hungry Never had this in the past  No history of diverticulitis  She feels like her belly sx have been stable for the last 3 or 4 days She is at work right now  No urinary sx  Her abdomen may hurt if she walks or moves   Patient Active Problem List   Diagnosis Date Noted  . Hypertension 07/05/2018  . Migraine without aura and without status migrainosus, not intractable 02/08/2016  . Depression 10/10/2015  . Injury by electrocution 10/10/2015  . Mixed hyperlipidemia 10/10/2015  . Prediabetes 10/10/2015  . Urinary incontinence 10/10/2015  .  Migraine headache 08/30/2015  . Sleep apnea 08/30/2015  . IIH (idiopathic intracranial hypertension) 11/14/2013  . Herniated nucleus pulposus, C6-7 right 02/14/2011    Past Medical History:  Diagnosis Date  . Hypertension   . Sleep apnea     Past Surgical History:  Procedure Laterality Date  . ANTERIOR CERVICAL DECOMP/DISCECTOMY FUSION  02/14/2011   Procedure: ANTERIOR CERVICAL DECOMPRESSION/DISCECTOMY FUSION 1 LEVEL;  Surgeon: Earleen Newport, MD;  Location: [facility];  Service: Neurosurgery;  Laterality: N/A;  Anterior Cervical Six-Seven Decompression and Fusion    Social History   Tobacco Use  . Smoking status: Former Smoker    Packs/day: 0.50    Types: Cigarettes  . Smokeless tobacco: Never Used  Substance Use Topics  . Alcohol use: No    Comment: occassionally  . Drug use: Yes    Family History  Problem Relation Age of Onset  . Diabetes Maternal Aunt   . Diabetes Maternal Uncle     Allergies  Allergen Reactions  . Sulfa Antibiotics Other (See Comments)    Acts "goofy"  . Neosporin  [Neomycin-Bacitracin Zn-Polymyx] Rash    Medication list has been reviewed and updated.  Current Outpatient Medications on File Prior to Visit  Medication Sig Dispense Refill  . acetaminophen (TYLENOL) 500 MG tablet Take 500 mg by mouth every 6 (six) hours as needed.    Marland Kitchen acetaZOLAMIDE (DIAMOX) 500 MG  capsule Take 2 capsules (1,000 mg total) by mouth 2 (two) times daily. 360 capsule 3  . Ascorbic Acid (VITAMIN C) 1000 MG tablet Take 1,000 mg by mouth daily.    Marland Kitchen aspirin EC 81 MG tablet Take 81 mg by mouth daily.    . cholecalciferol (VITAMIN D3) 25 MCG (1000 UT) tablet Take 1,000 Units by mouth daily.    Marland Kitchen doxycycline (VIBRA-TABS) 100 MG tablet Take 1 tablet (100 mg total) by mouth 2 (two) times daily. 20 tablet 0  . HYDROcodone-homatropine (HYCODAN) 5-1.5 MG/5ML syrup Take 5 mLs by mouth every 8 (eight) hours as needed for cough. 120 mL 0  . lisinopril-hydrochlorothiazide  (ZESTORETIC) 20-25 MG tablet Take 1 tablet by mouth daily. 90 tablet 3  . nortriptyline (PAMELOR) 25 MG capsule Take 3 caps at bedtime 180 capsule 3  . omeprazole (PRILOSEC) 20 MG capsule TAKE 1 CAPSULE BY MOUTH EVERY DAY 90 capsule 1  . pravastatin (PRAVACHOL) 20 MG tablet TAKE 1 TABLET BY MOUTH EACH DAY FOR CHOLESTEROL 90 tablet 3  . topiramate (TOPAMAX) 100 MG tablet Take 1 tablet (100 mg total) by mouth 2 (two) times daily. 180 tablet 3   No current facility-administered medications on file prior to visit.     Review of Systems:  As per HPI- otherwise negative.   Physical Examination: There were no vitals filed for this visit. There were no vitals filed for this visit. There is no height or weight on file to calculate BMI. Ideal Body Weight:    Pt observed on video  She looks well, no cough, SOB or distress is noted   Assessment and Plan: Left lower quadrant abdominal pain  Acute bacterial bronchitis - Plan: HYDROcodone-homatropine (HYCODAN) 5-1.5 MG/5ML syrup  Following up from recent illness. Cough- improved, still slightly bothersome  She requests a refill of her cough syrup today which I did provide  Her respiratory symptoms are continuing to improve, at this point doubt Covid testing would be helpful  Discuss left lower quadrant pain in detail with patient.  This could indicate diverticulitis.  Suggested that she visit the ER to have imaging and further evaluation done.  At this time she does not feel that her symptoms are severe enough to warrant an ER visit.  In this case, recommended that she consume liquids only or a soft, low residue diet for 2 or 3 days as this may help her improve. Asked her to visit the ER if she is getting worse, or if symptoms fail to improve.  Asked her to contact me if any questions or concerns Main  Signed Lamar Blinks, MD

## 2018-11-03 NOTE — Telephone Encounter (Signed)
Patient has appointment virtually with pcp to discuss today.

## 2018-11-18 DIAGNOSIS — Z20828 Contact with and (suspected) exposure to other viral communicable diseases: Secondary | ICD-10-CM | POA: Diagnosis not present

## 2018-12-06 DIAGNOSIS — Z30432 Encounter for removal of intrauterine contraceptive device: Secondary | ICD-10-CM | POA: Diagnosis not present

## 2019-01-01 ENCOUNTER — Other Ambulatory Visit: Payer: Self-pay | Admitting: Family Medicine

## 2019-01-01 DIAGNOSIS — K219 Gastro-esophageal reflux disease without esophagitis: Secondary | ICD-10-CM

## 2019-01-25 DIAGNOSIS — M5136 Other intervertebral disc degeneration, lumbar region: Secondary | ICD-10-CM | POA: Diagnosis not present

## 2019-01-25 DIAGNOSIS — M9903 Segmental and somatic dysfunction of lumbar region: Secondary | ICD-10-CM | POA: Diagnosis not present

## 2019-01-25 DIAGNOSIS — M5432 Sciatica, left side: Secondary | ICD-10-CM | POA: Diagnosis not present

## 2019-01-25 DIAGNOSIS — M5137 Other intervertebral disc degeneration, lumbosacral region: Secondary | ICD-10-CM | POA: Diagnosis not present

## 2019-01-26 DIAGNOSIS — M5136 Other intervertebral disc degeneration, lumbar region: Secondary | ICD-10-CM | POA: Diagnosis not present

## 2019-01-26 DIAGNOSIS — M5432 Sciatica, left side: Secondary | ICD-10-CM | POA: Diagnosis not present

## 2019-01-26 DIAGNOSIS — M9903 Segmental and somatic dysfunction of lumbar region: Secondary | ICD-10-CM | POA: Diagnosis not present

## 2019-01-26 DIAGNOSIS — M5137 Other intervertebral disc degeneration, lumbosacral region: Secondary | ICD-10-CM | POA: Diagnosis not present

## 2019-01-31 DIAGNOSIS — M5136 Other intervertebral disc degeneration, lumbar region: Secondary | ICD-10-CM | POA: Diagnosis not present

## 2019-01-31 DIAGNOSIS — M9903 Segmental and somatic dysfunction of lumbar region: Secondary | ICD-10-CM | POA: Diagnosis not present

## 2019-01-31 DIAGNOSIS — M5432 Sciatica, left side: Secondary | ICD-10-CM | POA: Diagnosis not present

## 2019-01-31 DIAGNOSIS — M5137 Other intervertebral disc degeneration, lumbosacral region: Secondary | ICD-10-CM | POA: Diagnosis not present

## 2019-02-01 DIAGNOSIS — M5432 Sciatica, left side: Secondary | ICD-10-CM | POA: Diagnosis not present

## 2019-02-01 DIAGNOSIS — M5137 Other intervertebral disc degeneration, lumbosacral region: Secondary | ICD-10-CM | POA: Diagnosis not present

## 2019-02-01 DIAGNOSIS — M5136 Other intervertebral disc degeneration, lumbar region: Secondary | ICD-10-CM | POA: Diagnosis not present

## 2019-02-01 DIAGNOSIS — M9903 Segmental and somatic dysfunction of lumbar region: Secondary | ICD-10-CM | POA: Diagnosis not present

## 2019-02-03 DIAGNOSIS — M5136 Other intervertebral disc degeneration, lumbar region: Secondary | ICD-10-CM | POA: Diagnosis not present

## 2019-02-03 DIAGNOSIS — M9903 Segmental and somatic dysfunction of lumbar region: Secondary | ICD-10-CM | POA: Diagnosis not present

## 2019-02-03 DIAGNOSIS — M5137 Other intervertebral disc degeneration, lumbosacral region: Secondary | ICD-10-CM | POA: Diagnosis not present

## 2019-02-03 DIAGNOSIS — M5432 Sciatica, left side: Secondary | ICD-10-CM | POA: Diagnosis not present

## 2019-02-07 ENCOUNTER — Other Ambulatory Visit: Payer: Self-pay

## 2019-02-07 ENCOUNTER — Telehealth (INDEPENDENT_AMBULATORY_CARE_PROVIDER_SITE_OTHER): Payer: BC Managed Care – PPO | Admitting: Neurology

## 2019-02-07 DIAGNOSIS — G43009 Migraine without aura, not intractable, without status migrainosus: Secondary | ICD-10-CM | POA: Diagnosis not present

## 2019-02-07 DIAGNOSIS — G932 Benign intracranial hypertension: Secondary | ICD-10-CM | POA: Diagnosis not present

## 2019-02-07 MED ORDER — ACETAZOLAMIDE ER 500 MG PO CP12: 1000.0000 mg | ORAL_CAPSULE | Freq: Two times a day (BID) | ORAL | 3 refills | Status: DC

## 2019-02-07 MED ORDER — NORTRIPTYLINE HCL 25 MG PO CAPS: ORAL_CAPSULE | ORAL | 3 refills | Status: DC

## 2019-02-07 MED ORDER — TOPIRAMATE 100 MG PO TABS: 100.0000 mg | ORAL_TABLET | Freq: Two times a day (BID) | ORAL | 3 refills | Status: DC

## 2019-02-07 NOTE — Progress Notes (Signed)
Virtual Visit via Video Note The purpose of this virtual visit is to provide medical care while limiting exposure to the novel coronavirus.    Consent was obtained for video visit:  Yes.   Answered questions that patient had about telehealth interaction:  Yes.   I discussed the limitations, risks, security and privacy concerns of performing an evaluation and management service by telemedicine. I also discussed with the patient that there may be a patient responsible charge related to this service. The patient expressed understanding and agreed to proceed.  Pt location: Home Physician Location: office Name of referring provider:  Copland, Gay Filler, MD I connected with Janice Lucas at patients initiation/request on 02/07/2019 at  4:00 PM EST by video enabled telemedicine application and verified that I am speaking with the correct person using two identifiers. Pt MRN:  [id number] Pt DOB:  [birth year] Video Participants:  Janice Lucas   History of Present Illness:  The patient was seen as a virtual video visit on 02/07/2019. She was last seen 7 months ago for pseudotumor cerebri and migraines. On her last visit, she was reporting an increase in headaches occurring 3-4 days a week. Nortriptyline dose increased to 75mg  qhs. She is also on Topiramate 100mg  BID and Diamox 1000mg  BID. Attempts in the past to reduce Diamox led to more headaches. She reports an improvement in headaches with increase in nortriptyline, she has 3-4 headaches a month. No side effects on medications. She has an eye appointment coming up soon. She denies any dizziness, vision changes. She almost fell going down the steps and caught herself. She has had some back troubles and sees a chiropractor, affecting her sleep the past month.     Chemistry      Component Value Date/Time   NA 140 07/13/2018 1443   K 3.5 07/13/2018 1443   CL 104 07/13/2018 1443   CO2 25 07/13/2018 1443   BUN 17 07/13/2018 1443   CREATININE 0.93  07/13/2018 1443   CREATININE 1.01 07/25/2016 1629      Component Value Date/Time   CALCIUM 9.0 07/13/2018 1443   ALKPHOS 57 07/13/2018 1443   AST 13 07/13/2018 1443   ALT 13 07/13/2018 1443   BILITOT 0.4 07/13/2018 1443      History on Initial Assessment 02/08/2016: This is a pleasant 55 yo RH woman with a history of pseudotumor cerebri diagnosed in 2015. Records from [facility] were reviewed, she presented to her eye doctor for a routine exam in April 2015 with a 25-month history of headaches thinking she may need new glasses. She was noted to have mild papilledema, right > left. At that time, headaches were behind her eyes and at the vertex, daily, worsened by bending, lifting, coughing, and sneezing. MRI and MRV brain were reported as normal. She had a lumbar puncture with opening pressure of 25 in the lateral position. They report problems with her LP, she had a post-spinal tap headache that initial blood patch did not help with. She was started on Topamax and Diamox, and nortriptyline was added for migraine prevention. She was doing very well on this regimen with rare headaches. Her neurologist Dr. Baltazar Najjar left the practice and she saw Dr. Ermalene Postin last October, with note of improvement in eye exam from last visit, no evidence of papilledema, and rare headaches. They discussed reducing some of her medications, Topamax was reduced to 100mg  daily. With reduction in Topamax, she noticed an increase in headaches, particularly in the morning. She  then lost her insurance and was unable to obtain the Diamox. Without this, headaches became much worse. She describes them as diffuse sharp pain, with occasional photo and phonophobia when severe, no nausea/vomiting. Bending over causes floaters in her vision. She has intermittent tinnitus (non-pulsatile). She feels her vision is getting worse, she is straining more to use the computer with blurry vision, no loss of vision. No driving difficulties with  peripheral vision.   She has been back on the Diamox after seeing her PCP, currently on uptitration taking 500mg  BID, then increasing back to original dose of 1000mg  BID. She had some paresthesias on the Diamox, and found that bananas help with this. She denies any dysarthria/dysphagia, neck/back pain, bowel/bladder dysfunction, dizziness, no falls. She reports some numbness in the three fingers of her right hand, they feel cold suddenly ("like ice cubes") and hurt. 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      Current Outpatient Medications on File Prior to Visit  Medication Sig Dispense Refill  . acetaminophen (TYLENOL) 500 MG tablet Take 500 mg by mouth every 6 (six) hours as needed.    Marland Kitchen acetaZOLAMIDE (DIAMOX) 500 MG capsule Take 2 capsules (1,000 mg total) by mouth 2 (two) times daily. 360 capsule 3  . Ascorbic Acid (VITAMIN C) 1000 MG tablet Take 1,000 mg by mouth daily.    Marland Kitchen aspirin EC 81 MG tablet Take 81 mg by mouth daily.    . cholecalciferol (VITAMIN D3) 25 MCG (1000 UT) tablet Take 1,000 Units by mouth daily.    Marland Kitchen HYDROcodone-homatropine (HYCODAN) 5-1.5 MG/5ML syrup Take 5 mLs by mouth every 8 (eight) hours as needed for cough. (Patient not taking: Reported on 02/07/2019) 90 mL 0  . lisinopril-hydrochlorothiazide (ZESTORETIC) 20-25 MG tablet Take 1 tablet by mouth daily. 90 tablet 3  . nortriptyline (PAMELOR) 25 MG capsule Take 3 caps at bedtime 180 capsule 3  . omeprazole (PRILOSEC) 20 MG capsule TAKE 1 CAPSULE BY MOUTH EACH DAY FOR ACID REFLUX 90 capsule 1  . pravastatin (PRAVACHOL) 20 MG tablet TAKE 1 TABLET BY MOUTH EACH DAY FOR CHOLESTEROL 90 tablet 3  . topiramate (TOPAMAX) 100 MG tablet Take 1 tablet (100 mg total) by mouth 2 (two) times daily. 180 tablet 3   No current facility-administered medications on file prior to visit.     Observations/Objective:   GEN:  The patient appears stated age and is in NAD.  Neurological  examination: Patient is awake, alert, oriented x 3. No aphasia or dysarthria. Intact fluency and comprehension. Remote and recent memory intact. Able to name and repeat. Cranial nerves: Extraocular movements intact with no nystagmus. No facial asymmetry. Motor: moves all extremities symmetrically, at least anti-gravity x 4.    Assessment and Plan:   This is a pleasant 54 yo RH woman with a history of migraines and pseudotumor cerebri diagnosed in 2015. Prior attempts to wean Diamox caused increase in headaches. She has had a good response to increase in nortriptyline to 75mg  qhs. Continue Diamox 1000mg  BID and Topiramate 100mg  BID. Continue follow-up with eye doctor. She knows to minimize Tylenol intake to 2-3 times a week to avoid rebound headaches. She will follow-up in 6 months and knows to call for any changes.   Follow Up Instructions:   -I discussed the assessment and treatment plan with the patient. The patient was provided an opportunity to ask questions and all were answered. The patient agreed with the plan and demonstrated an  understanding of the instructions.   The patient was advised to call back or seek an in-person evaluation if the symptoms worsen or if the condition fails to improve as anticipated.     Cameron Sprang, MD

## 2019-02-08 DIAGNOSIS — M5137 Other intervertebral disc degeneration, lumbosacral region: Secondary | ICD-10-CM | POA: Diagnosis not present

## 2019-02-08 DIAGNOSIS — M9903 Segmental and somatic dysfunction of lumbar region: Secondary | ICD-10-CM | POA: Diagnosis not present

## 2019-02-08 DIAGNOSIS — M5136 Other intervertebral disc degeneration, lumbar region: Secondary | ICD-10-CM | POA: Diagnosis not present

## 2019-02-08 DIAGNOSIS — M5432 Sciatica, left side: Secondary | ICD-10-CM | POA: Diagnosis not present

## 2019-02-09 DIAGNOSIS — M5432 Sciatica, left side: Secondary | ICD-10-CM | POA: Diagnosis not present

## 2019-02-09 DIAGNOSIS — M9903 Segmental and somatic dysfunction of lumbar region: Secondary | ICD-10-CM | POA: Diagnosis not present

## 2019-02-09 DIAGNOSIS — M5137 Other intervertebral disc degeneration, lumbosacral region: Secondary | ICD-10-CM | POA: Diagnosis not present

## 2019-02-09 DIAGNOSIS — M5136 Other intervertebral disc degeneration, lumbar region: Secondary | ICD-10-CM | POA: Diagnosis not present

## 2019-02-10 DIAGNOSIS — M5136 Other intervertebral disc degeneration, lumbar region: Secondary | ICD-10-CM | POA: Diagnosis not present

## 2019-02-10 DIAGNOSIS — M5137 Other intervertebral disc degeneration, lumbosacral region: Secondary | ICD-10-CM | POA: Diagnosis not present

## 2019-02-10 DIAGNOSIS — M5432 Sciatica, left side: Secondary | ICD-10-CM | POA: Diagnosis not present

## 2019-02-10 DIAGNOSIS — M9903 Segmental and somatic dysfunction of lumbar region: Secondary | ICD-10-CM | POA: Diagnosis not present

## 2019-02-15 DIAGNOSIS — M5432 Sciatica, left side: Secondary | ICD-10-CM | POA: Diagnosis not present

## 2019-02-15 DIAGNOSIS — M9903 Segmental and somatic dysfunction of lumbar region: Secondary | ICD-10-CM | POA: Diagnosis not present

## 2019-02-15 DIAGNOSIS — M5137 Other intervertebral disc degeneration, lumbosacral region: Secondary | ICD-10-CM | POA: Diagnosis not present

## 2019-02-15 DIAGNOSIS — M5136 Other intervertebral disc degeneration, lumbar region: Secondary | ICD-10-CM | POA: Diagnosis not present

## 2019-02-17 DIAGNOSIS — M5137 Other intervertebral disc degeneration, lumbosacral region: Secondary | ICD-10-CM | POA: Diagnosis not present

## 2019-02-17 DIAGNOSIS — M9903 Segmental and somatic dysfunction of lumbar region: Secondary | ICD-10-CM | POA: Diagnosis not present

## 2019-02-17 DIAGNOSIS — M5432 Sciatica, left side: Secondary | ICD-10-CM | POA: Diagnosis not present

## 2019-02-17 DIAGNOSIS — M5136 Other intervertebral disc degeneration, lumbar region: Secondary | ICD-10-CM | POA: Diagnosis not present

## 2019-02-22 DIAGNOSIS — M5432 Sciatica, left side: Secondary | ICD-10-CM | POA: Diagnosis not present

## 2019-02-22 DIAGNOSIS — M5136 Other intervertebral disc degeneration, lumbar region: Secondary | ICD-10-CM | POA: Diagnosis not present

## 2019-02-22 DIAGNOSIS — M5137 Other intervertebral disc degeneration, lumbosacral region: Secondary | ICD-10-CM | POA: Diagnosis not present

## 2019-02-22 DIAGNOSIS — M9903 Segmental and somatic dysfunction of lumbar region: Secondary | ICD-10-CM | POA: Diagnosis not present

## 2019-02-28 DIAGNOSIS — M5137 Other intervertebral disc degeneration, lumbosacral region: Secondary | ICD-10-CM | POA: Diagnosis not present

## 2019-02-28 DIAGNOSIS — M9903 Segmental and somatic dysfunction of lumbar region: Secondary | ICD-10-CM | POA: Diagnosis not present

## 2019-02-28 DIAGNOSIS — M5136 Other intervertebral disc degeneration, lumbar region: Secondary | ICD-10-CM | POA: Diagnosis not present

## 2019-02-28 DIAGNOSIS — M5432 Sciatica, left side: Secondary | ICD-10-CM | POA: Diagnosis not present

## 2019-03-14 ENCOUNTER — Telehealth: Payer: Self-pay | Admitting: Neurology
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
## 2019-03-14 NOTE — Telephone Encounter (Signed)
Patient states that she fell the other day and has not felt good since she would like to speak to someone please call

## 2019-03-15 DIAGNOSIS — M5137 Other intervertebral disc degeneration, lumbosacral region: Secondary | ICD-10-CM | POA: Diagnosis not present

## 2019-03-15 DIAGNOSIS — M9903 Segmental and somatic dysfunction of lumbar region: Secondary | ICD-10-CM | POA: Diagnosis not present

## 2019-03-15 DIAGNOSIS — M5432 Sciatica, left side: Secondary | ICD-10-CM | POA: Diagnosis not present

## 2019-03-15 DIAGNOSIS — M5136 Other intervertebral disc degeneration, lumbar region: Secondary | ICD-10-CM | POA: Diagnosis not present

## 2019-03-18 ENCOUNTER — Encounter: Payer: Self-pay | Admitting: Family Medicine

## 2019-03-21 MED ORDER — ZOSTER VAC RECOMB ADJUVANTED 50 MCG/0.5ML IM SUSR: 0.5000 mL | Freq: Once | INTRAMUSCULAR | 0 refills | Status: AC

## 2019-03-29 DIAGNOSIS — M5136 Other intervertebral disc degeneration, lumbar region: Secondary | ICD-10-CM | POA: Diagnosis not present

## 2019-03-29 DIAGNOSIS — M5432 Sciatica, left side: Secondary | ICD-10-CM | POA: Diagnosis not present

## 2019-03-29 DIAGNOSIS — M9903 Segmental and somatic dysfunction of lumbar region: Secondary | ICD-10-CM | POA: Diagnosis not present

## 2019-03-29 DIAGNOSIS — M5137 Other intervertebral disc degeneration, lumbosacral region: Secondary | ICD-10-CM | POA: Diagnosis not present

## 2019-04-22 ENCOUNTER — Telehealth: Payer: Self-pay | Admitting: Family Medicine

## 2019-04-22 MED ORDER — FLUTICASONE PROPIONATE 50 MCG/ACT NA SUSP: 2.0000 | Freq: Every day | NASAL | 6 refills | Status: DC

## 2019-04-22 NOTE — Telephone Encounter (Signed)
Patient is requesting a prescription of FLONASE sent in to [facility].     [place], [place] - [address]  [place], [place] [address]  Phone:  [phone] Fax:  [phone]

## 2019-04-22 NOTE — Telephone Encounter (Signed)
Ok to send in? Not on current med list.

## 2019-06-14 DIAGNOSIS — E669 Obesity, unspecified: Secondary | ICD-10-CM | POA: Diagnosis not present

## 2019-06-14 DIAGNOSIS — G4733 Obstructive sleep apnea (adult) (pediatric): Secondary | ICD-10-CM | POA: Diagnosis not present

## 2019-06-14 DIAGNOSIS — J309 Allergic rhinitis, unspecified: Secondary | ICD-10-CM | POA: Diagnosis not present

## 2019-06-14 DIAGNOSIS — I10 Essential (primary) hypertension: Secondary | ICD-10-CM | POA: Diagnosis not present

## 2019-07-12 ENCOUNTER — Other Ambulatory Visit: Payer: Self-pay | Admitting: Family Medicine

## 2019-07-12 DIAGNOSIS — E785 Hyperlipidemia, unspecified: Secondary | ICD-10-CM

## 2019-07-12 DIAGNOSIS — I10 Essential (primary) hypertension: Secondary | ICD-10-CM

## 2019-07-13 ENCOUNTER — Other Ambulatory Visit: Payer: Self-pay | Admitting: Family Medicine

## 2019-07-13 DIAGNOSIS — K219 Gastro-esophageal reflux disease without esophagitis: Secondary | ICD-10-CM

## 2019-07-25 ENCOUNTER — Encounter: Payer: Self-pay | Admitting: Family Medicine

## 2019-07-25 DIAGNOSIS — L819 Disorder of pigmentation, unspecified: Secondary | ICD-10-CM

## 2019-07-26 ENCOUNTER — Telehealth: Payer: Self-pay | Admitting: Family Medicine

## 2019-07-26 NOTE — Telephone Encounter (Signed)
Jasmine with [facility]    States appointment made : July 26,2021 @230pm 

## 2019-08-08 DIAGNOSIS — D2362 Other benign neoplasm of skin of left upper limb, including shoulder: Secondary | ICD-10-CM | POA: Diagnosis not present

## 2019-08-08 DIAGNOSIS — D485 Neoplasm of uncertain behavior of skin: Secondary | ICD-10-CM | POA: Diagnosis not present

## 2019-08-08 DIAGNOSIS — L821 Other seborrheic keratosis: Secondary | ICD-10-CM | POA: Diagnosis not present

## 2019-08-08 DIAGNOSIS — L720 Epidermal cyst: Secondary | ICD-10-CM | POA: Diagnosis not present

## 2019-08-08 DIAGNOSIS — D1801 Hemangioma of skin and subcutaneous tissue: Secondary | ICD-10-CM | POA: Diagnosis not present

## 2019-08-29 DIAGNOSIS — L720 Epidermal cyst: Secondary | ICD-10-CM | POA: Diagnosis not present

## 2019-09-02 DIAGNOSIS — Z20822 Contact with and (suspected) exposure to covid-19: Secondary | ICD-10-CM | POA: Diagnosis not present

## 2019-09-16 ENCOUNTER — Encounter: Payer: Self-pay | Admitting: Neurology

## 2019-09-16 ENCOUNTER — Other Ambulatory Visit: Payer: Self-pay

## 2019-09-16 ENCOUNTER — Telehealth (INDEPENDENT_AMBULATORY_CARE_PROVIDER_SITE_OTHER): Payer: BC Managed Care – PPO | Admitting: Neurology

## 2019-09-16 VITALS — Ht 67.0 in | Wt 260.0 lb

## 2019-09-16 DIAGNOSIS — G932 Benign intracranial hypertension: Secondary | ICD-10-CM

## 2019-09-16 DIAGNOSIS — G43009 Migraine without aura, not intractable, without status migrainosus: Secondary | ICD-10-CM
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
## 2019-10-12 ENCOUNTER — Other Ambulatory Visit: Payer: Self-pay | Admitting: Family Medicine

## 2019-10-12 DIAGNOSIS — E785 Hyperlipidemia, unspecified: Secondary | ICD-10-CM

## 2019-10-12 DIAGNOSIS — I10 Essential (primary) hypertension: Secondary | ICD-10-CM

## 2019-11-01 NOTE — Patient Instructions (Signed)
It was good to see you again today, I will be in touch with your labs soon as possible 
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
## 2019-11-02 ENCOUNTER — Other Ambulatory Visit: Payer: Self-pay

## 2019-11-02 ENCOUNTER — Telehealth (INDEPENDENT_AMBULATORY_CARE_PROVIDER_SITE_OTHER): Payer: BC Managed Care – PPO | Admitting: Family Medicine

## 2019-11-02 DIAGNOSIS — Z1329 Encounter for screening for other suspected endocrine disorder: Secondary | ICD-10-CM

## 2019-11-02 DIAGNOSIS — I10 Essential (primary) hypertension: Secondary | ICD-10-CM

## 2019-11-02 DIAGNOSIS — E785 Hyperlipidemia, unspecified: Secondary | ICD-10-CM

## 2019-11-02 DIAGNOSIS — Z130 Encounter for screening for diseases of the blood and blood-forming organs and certain disorders involving the immune mechanism: Secondary | ICD-10-CM

## 2019-11-02 DIAGNOSIS — Z1231 Encounter for screening mammogram for malignant neoplasm of breast: Secondary | ICD-10-CM

## 2019-11-02 DIAGNOSIS — K219 Gastro-esophageal reflux disease without esophagitis: Secondary | ICD-10-CM

## 2019-11-02 DIAGNOSIS — Z1159 Encounter for screening for other viral diseases: Secondary | ICD-10-CM

## 2019-11-02 DIAGNOSIS — R7303 Prediabetes: Secondary | ICD-10-CM | POA: Diagnosis not present

## 2019-11-02 MED ORDER — LISINOPRIL-HYDROCHLOROTHIAZIDE 20-25 MG PO TABS: 1.0000 | ORAL_TABLET | Freq: Every day | ORAL | 3 refills | Status: DC

## 2019-11-02 MED ORDER — OMEPRAZOLE 20 MG PO CPDR: 20.0000 mg | DELAYED_RELEASE_CAPSULE | Freq: Every day | ORAL | 3 refills | Status: DC

## 2019-11-02 MED ORDER — PRAVASTATIN SODIUM 20 MG PO TABS: 20.0000 mg | ORAL_TABLET | Freq: Every day | ORAL | 3 refills | Status: DC

## 2019-11-08 ENCOUNTER — Encounter: Payer: Self-pay | Admitting: Family Medicine

## 2019-11-09 NOTE — Telephone Encounter (Signed)
Pt scheduled already.  ?

## 2019-11-15 ENCOUNTER — Other Ambulatory Visit: Payer: Self-pay

## 2019-11-15 ENCOUNTER — Other Ambulatory Visit (INDEPENDENT_AMBULATORY_CARE_PROVIDER_SITE_OTHER): Payer: BC Managed Care – PPO

## 2019-11-15 ENCOUNTER — Ambulatory Visit (INDEPENDENT_AMBULATORY_CARE_PROVIDER_SITE_OTHER): Payer: BC Managed Care – PPO | Admitting: *Deleted

## 2019-11-15 DIAGNOSIS — Z23 Encounter for immunization: Secondary | ICD-10-CM | POA: Diagnosis not present

## 2019-11-15 DIAGNOSIS — Z130 Encounter for screening for diseases of the blood and blood-forming organs and certain disorders involving the immune mechanism: Secondary | ICD-10-CM

## 2019-11-15 DIAGNOSIS — I10 Essential (primary) hypertension: Secondary | ICD-10-CM

## 2019-11-15 DIAGNOSIS — R7303 Prediabetes: Secondary | ICD-10-CM | POA: Diagnosis not present

## 2019-11-15 DIAGNOSIS — E785 Hyperlipidemia, unspecified: Secondary | ICD-10-CM

## 2019-11-15 DIAGNOSIS — Z1159 Encounter for screening for other viral diseases: Secondary | ICD-10-CM

## 2019-11-15 NOTE — Progress Notes (Signed)
Patient here for first shingles vaccine and flu vaccine  Shingles vaccine give in left deltoid and flu vaccine given in right deltoid.  Patient tolerated both vaccines well.  Appointment made for second shingles vaccine for 01/17/20

## 2019-11-15 NOTE — Addendum Note (Signed)
Addended by: Manuela Schwartz on: 11/15/2019 03:05 PM   Modules accepted: Orders

## 2019-11-16 ENCOUNTER — Encounter: Payer: Self-pay | Admitting: Family Medicine

## 2019-11-16 LAB — HEPATITIS C ANTIBODY
Hepatitis C Ab: NONREACTIVE
SIGNAL TO CUT-OFF: 0.02 (ref <1.00)

## 2019-11-16 LAB — HEMOGLOBIN A1C
Hgb A1c MFr Bld: 6.1 % of total Hgb — ABNORMAL HIGH (ref <5.7)
Mean Plasma Glucose: 128 (calc)
eAG (mmol/L): 7.1 (calc)

## 2019-11-16 LAB — COMPREHENSIVE METABOLIC PANEL
AG Ratio: 1.4 (calc) (ref 1.0–2.5)
ALT: 13 U/L (ref 6–29)
AST: 13 U/L (ref 10–35)
Albumin: 4.2 g/dL (ref 3.6–5.1)
Alkaline phosphatase (APISO): 64 U/L (ref 37–153)
BUN: 17 mg/dL (ref 7–25)
CO2: 23 mmol/L (ref 20–32)
Calcium: 9.1 mg/dL (ref 8.6–10.4)
Chloride: 101 mmol/L (ref 98–110)
Creat: 0.84 mg/dL (ref 0.50–1.05)
Globulin: 3.1 g/dL (calc) (ref 1.9–3.7)
Glucose, Bld: 92 mg/dL (ref 65–99)
Potassium: 3.5 mmol/L (ref 3.5–5.3)
Sodium: 138 mmol/L (ref 135–146)
Total Bilirubin: 0.4 mg/dL (ref 0.2–1.2)
Total Protein: 7.3 g/dL (ref 6.1–8.1)

## 2019-11-16 LAB — LIPID PANEL
Cholesterol: 187 mg/dL (ref <200)
HDL: 53 mg/dL (ref >=50)
LDL Cholesterol (Calc): 110 mg/dL (calc) — ABNORMAL HIGH
Non-HDL Cholesterol (Calc): 134 mg/dL (calc) — ABNORMAL HIGH (ref <130)
Total CHOL/HDL Ratio: 3.5 (calc) (ref <5.0)
Triglycerides: 127 mg/dL (ref <150)

## 2019-11-16 LAB — CBC
HCT: 40.5 % (ref 35.0–45.0)
Hemoglobin: 13.6 g/dL (ref 11.7–15.5)
MCH: 29.9 pg (ref 27.0–33.0)
MCHC: 33.6 g/dL (ref 32.0–36.0)
MCV: 89 fL (ref 80.0–100.0)
MPV: 9.5 fL (ref 7.5–12.5)
Platelets: 307 Thousand/uL (ref 140–400)
RBC: 4.55 Million/uL (ref 3.80–5.10)
RDW: 12.7 % (ref 11.0–15.0)
WBC: 10.8 Thousand/uL (ref 3.8–10.8)

## 2019-11-16 NOTE — Progress Notes (Signed)
Received labs as below, message to patient Prediabetes is stable Lipids improved Results for orders placed or performed in visit on 11/15/19  Lipid panel  Result Value Ref Range   Cholesterol 187 <200 mg/dL   HDL 53 > OR = 50 mg/dL   Triglycerides 127 <150 mg/dL   LDL Cholesterol (Calc) 110 (H) mg/dL (calc)   Total CHOL/HDL Ratio 3.5 <5.0 (calc)   Non-HDL Cholesterol (Calc) 134 (H) <130 mg/dL (calc)  Hepatitis C antibody  Result Value Ref Range   Hepatitis C Ab NON-REACTIVE NON-REACTI   SIGNAL TO CUT-OFF 0.02 <1.00  Hemoglobin A1c  Result Value Ref Range   Hgb A1c MFr Bld 6.1 (H) <5.7 % of total Hgb   Mean Plasma Glucose 128 (calc)   eAG (mmol/L) 7.1 (calc)  Comprehensive metabolic panel  Result Value Ref Range   Glucose, Bld 92 65 - 99 mg/dL   BUN 17 7 - 25 mg/dL   Creat 0.84 0.50 - 1.05 mg/dL   BUN/Creatinine Ratio NOT APPLICABLE 6 - 22 (calc)   Sodium 138 135 - 146 mmol/L   Potassium 3.5 3.5 - 5.3 mmol/L   Chloride 101 98 - 110 mmol/L   CO2 23 20 - 32 mmol/L   Calcium 9.1 8.6 - 10.4 mg/dL   Total Protein 7.3 6.1 - 8.1 g/dL   Albumin 4.2 3.6 - 5.1 g/dL   Globulin 3.1 1.9 - 3.7 g/dL (calc)   AG Ratio 1.4 1.0 - 2.5 (calc)   Total Bilirubin 0.4 0.2 - 1.2 mg/dL   Alkaline phosphatase (APISO) 64 37 - 153 U/L   AST 13 10 - 35 U/L   ALT 13 6 - 29 U/L  CBC  Result Value Ref Range   WBC 10.8 3.8 - 10.8 Thousand/uL   RBC 4.55 3.80 - 5.10 Million/uL   Hemoglobin 13.6 11.7 - 15.5 g/dL   HCT 40.5 35 - 45 %   MCV 89.0 80.0 - 100.0 fL   MCH 29.9 27.0 - 33.0 pg   MCHC 33.6 32.0 - 36.0 g/dL   RDW 12.7 11.0 - 15.0 %   Platelets 307 140 - 400 Thousand/uL   MPV 9.5 7.5 - 12.5 fL

## 2019-11-21 ENCOUNTER — Telehealth: Payer: Self-pay | Admitting: Neurology

## 2019-11-21 ENCOUNTER — Other Ambulatory Visit: Payer: Self-pay | Admitting: Neurology

## 2019-11-21 ENCOUNTER — Other Ambulatory Visit: Payer: Self-pay

## 2019-11-21 DIAGNOSIS — G932 Benign intracranial hypertension: Secondary | ICD-10-CM

## 2019-11-21 MED ORDER — TOPIRAMATE 100 MG PO TABS: 100.0000 mg | ORAL_TABLET | Freq: Two times a day (BID) | ORAL | 0 refills | Status: DC

## 2019-11-21 NOTE — Telephone Encounter (Signed)
Refill sent.

## 2019-11-21 NOTE — Telephone Encounter (Signed)
Patient left message with accessnurse requesting refill of headache medication.

## 2019-11-21 NOTE — Telephone Encounter (Signed)
Patient states she was told to call back and let Dr Delice Lesch know if increasing the Nortriptyline to 4 pills is working for me. She reports that it is and is asking that a prescription be sent in, where she only has to take 2 pills, to [facility].  She is also wanting to let Dr Delice Lesch know that she had her PCP draw the labs and was told that the results would be forwarded to Dr Delice Lesch. Lab results are back.

## 2019-11-22 MED ORDER — NORTRIPTYLINE HCL 50 MG PO CAPS: ORAL_CAPSULE | ORAL | 3 refills | Status: DC

## 2019-11-22 NOTE — Telephone Encounter (Signed)
Pls let her know updated Rx for nortriptyline 50mg : Take 2 caps qhs was sent. I reviewed labs, look okay. Thanks

## 2019-11-22 NOTE — Telephone Encounter (Signed)
Pt called no answer left a voice mail for pt to call the office back  

## 2019-11-23 ENCOUNTER — Telehealth (INDEPENDENT_AMBULATORY_CARE_PROVIDER_SITE_OTHER): Payer: BC Managed Care – PPO | Admitting: Family

## 2019-11-23 ENCOUNTER — Other Ambulatory Visit: Payer: Self-pay

## 2019-11-23 DIAGNOSIS — J069 Acute upper respiratory infection, unspecified: Secondary | ICD-10-CM | POA: Diagnosis not present

## 2019-11-23 MED ORDER — GUAIFENESIN-CODEINE 100-10 MG/5ML PO SYRP: 5.0000 mL | ORAL_SOLUTION | Freq: Three times a day (TID) | ORAL | 0 refills | Status: DC | PRN

## 2019-11-23 NOTE — Patient Instructions (Signed)
1.  text "covid" to [phone]    or   2.  Schedule at [email or web address]   or    3.  Call [phone]

## 2019-11-23 NOTE — Telephone Encounter (Signed)
Pt called and informed that updated Rx for nortriptyline 50mg : Take 2 caps qhs was sent to pharmacy . Dr Delice Lesch  reviewed labs, look okay

## 2019-11-23 NOTE — Progress Notes (Signed)
Virtual Visit via Video Note  I connected with Janice Lucas on 11/23/19 at 12:00 PM EST by telephone and verified that I am speaking with the correct person using two identifiers. We attempted video but pt had technical difficulties and we transitioned to a telephone call.   Location: Patient: home Provider: work   I discussed the limitations of evaluation and management by telemedicine and the availability of in person appointments. The patient expressed understanding and agreed to proceed. Only the patient and myself were present for today's video call.   History of Present Illness:  Patient complains of sinus congestion. Began after lunch yesterday. Denies fever.  + cough.  Denies loss of taste/smell.  She is not vaccinated against covid.     Observations/Objective:   Gen: Awake, alert, no acute distress Resp: Breathing is even and non-labored Psych: calm/pleasant demeanor Neuro: Alert and Oriented x 3, + facial symmetry, speech is clear.   Assessment and Plan:  Viral URI-  Advised pt that symptoms most likely viral etiology at this point. She is bothered by the cough and states that delsym and tessalon are not helpful for her. She is requesting rx for Cheratussin which has worked well for her in the past. Reviewed  controlled substance registry and sent a 5 day supply to her pharmacy.  I also advised pt that I think she should be tested for COVID-19 and quarantine until her results are back and confirmed negative.  Pt verbalizes understanding and is advised to call if new/worsening symptoms or if symptoms are not improved in 1 week.   11 minutes spent on today's phone visit.  Follow Up Instructions:    I discussed the assessment and treatment plan with the patient. The patient was provided an opportunity to ask questions and all were answered. The patient agreed with the plan and demonstrated an understanding of the instructions.   The patient was advised to call back or  seek an in-person evaluation if the symptoms worsen or if the condition fails to improve as anticipated.  Nance Pear, NP

## 2019-11-24 ENCOUNTER — Other Ambulatory Visit: Payer: Self-pay | Admitting: Family Medicine

## 2019-11-24 ENCOUNTER — Encounter: Payer: Self-pay | Admitting: Family Medicine

## 2019-11-24 ENCOUNTER — Telehealth: Payer: Self-pay | Admitting: Family Medicine

## 2019-11-24 MED ORDER — AMOXICILLIN 500 MG PO CAPS: 1000.0000 mg | ORAL_CAPSULE | Freq: Two times a day (BID) | ORAL | 0 refills | Status: DC

## 2019-11-24 NOTE — Telephone Encounter (Signed)
Patient wife's calling to check the status

## 2019-11-24 NOTE — Telephone Encounter (Signed)
Patient saw [name] for VV.

## 2019-11-24 NOTE — Telephone Encounter (Signed)
Patient states seen yesterday virtually for sinus infection, patient is requesting a antibiotic sent in to local pharmacy .  Please Advise

## 2019-11-24 NOTE — Telephone Encounter (Signed)
Amoxicillin has already sent in

## 2019-11-24 NOTE — Telephone Encounter (Signed)
Please advise 

## 2019-11-28 MED ORDER — DOXYCYCLINE HYCLATE 100 MG PO TABS: 100.0000 mg | ORAL_TABLET | Freq: Two times a day (BID) | ORAL | 0 refills | Status: DC

## 2019-11-28 NOTE — Addendum Note (Signed)
Addended by: Lamar Blinks C on: 11/28/2019 04:11 PM   Modules accepted: Orders

## 2019-11-29 NOTE — Patient Instructions (Addendum)
It was good to see you again today Please let me know if you are not continuing to improve over the next several days- Sooner if worse.   Stop amoxicillin- continue doxycycline  Cough syrup as needed- it will make you drowsy Please complete your cologuard screening asap

## 2019-11-29 NOTE — Progress Notes (Signed)
[facility] [address], [place], [place] [address] [phone]  Date:  11/30/2019   Name:  Janice Lucas   DOB:  1965-11-08   MRN:  [id number]  PCP:  Darreld Mclean, MD    Chief Complaint: Sinusitis (vv last week)   History of Present Illness:  Janice Lucas is a 54 y.o. very pleasant female patient who presents with the following:  Pt with history of HTN, migraine HA, hyperlipidemia, intracranial hypertension/ pseudotumor cerebri dx in 2015, pre-diabetes   Patient is here today for a follow-up visit-she had recently contacted [place] with concern of sinusitis on November 10 and we treated with amoxicillin, this then seemed to progress to bronchitis on November 15, I changed her prescription to doxycycline.  She home tested for Covid and was negative, I asked her  to come in for in person visit  Covid vaccine- encouraged to get this done Flu vaccine- done  Colon cancer screening- she has cologuard kit but has not gotten it done yet.   Pap smear She had her IUD removed- she had a pap done then as well  mammo- she plans to do this   Lab work done November 2-CMP, lipid, CBC, A1c-all okay  She notes rattling and coughing in her chest She is coughing up some green material  She is not sure, maybe a mild fever this week No vomiting or diarrhea  She is using flonase at night She cannot sleep in her bed as she is coughing too much  She needs a stronger cough med    Patient Active Problem List   Diagnosis Date Noted  . Hypertension 07/05/2018  . Migraine without aura and without status migrainosus, not intractable 02/08/2016  . Depression 10/10/2015  . Injury by electrocution 10/10/2015  . Mixed hyperlipidemia 10/10/2015  . Prediabetes 10/10/2015  . Urinary incontinence 10/10/2015  . Migraine headache 08/30/2015  . Sleep apnea 08/30/2015  . IIH (idiopathic intracranial hypertension) 11/14/2013  .  Herniated nucleus pulposus, C6-7 right 02/14/2011    Past Medical History:  Diagnosis Date  . Hypertension   . Sleep apnea     Past Surgical History:  Procedure Laterality Date  . ANTERIOR CERVICAL DECOMP/DISCECTOMY FUSION  02/14/2011   Procedure: ANTERIOR CERVICAL DECOMPRESSION/DISCECTOMY FUSION 1 LEVEL;  Surgeon: Earleen Newport, MD;  Location: [facility];  Service: Neurosurgery;  Laterality: N/A;  Anterior Cervical Six-Seven Decompression and Fusion    Social History   Tobacco Use  . Smoking status: Former Smoker    Packs/day: 0.50    Types: Cigarettes  . Smokeless tobacco: Never Used  Vaping Use  . Vaping Use: Every day  . Substances: Nicotine, Flavoring  Substance Use Topics  . Alcohol use: No    Comment: occassionally  . Drug use: Yes    Family History  Problem Relation Age of Onset  . Diabetes Maternal Aunt   . Diabetes Maternal Uncle     Allergies  Allergen Reactions  . Sulfa Antibiotics Other (See Comments)    Acts "goofy"  . Neosporin  [Neomycin-Bacitracin Zn-Polymyx] Rash    Medication list has been reviewed and updated.  Current Outpatient Medications on File Prior to Visit  Medication Sig Dispense Refill  . acetaminophen (TYLENOL) 500 MG tablet Take 500 mg by mouth every 6 (six) hours as needed.    Marland Kitchen acetaZOLAMIDE (DIAMOX) 500 MG capsule Take 2 capsules (1,000 mg total) by mouth 2 (two)  times daily. 360 capsule 3  . Ascorbic Acid (VITAMIN C) 1000 MG tablet Take 1,000 mg by mouth daily.    Marland Kitchen aspirin EC 81 MG tablet Take 81 mg by mouth daily.    . cholecalciferol (VITAMIN D3) 25 MCG (1000 UT) tablet Take 1,000 Units by mouth daily.    Marland Kitchen doxycycline (VIBRA-TABS) 100 MG tablet Take 1 tablet (100 mg total) by mouth 2 (two) times daily. 20 tablet 0  . fluticasone (FLONASE) 50 MCG/ACT nasal spray Place 2 sprays into both nostrils daily. 16 g 6  . lisinopril-hydrochlorothiazide (ZESTORETIC) 20-25 MG tablet Take 1 tablet by mouth daily. 90 tablet 3  .  nortriptyline (PAMELOR) 50 MG capsule Take 2 capsules every night 180 capsule 3  . omeprazole (PRILOSEC) 20 MG capsule Take 1 capsule (20 mg total) by mouth daily. 90 capsule 3  . pravastatin (PRAVACHOL) 20 MG tablet Take 1 tablet (20 mg total) by mouth daily. 90 tablet 3  . topiramate (TOPAMAX) 100 MG tablet Take 1 tablet (100 mg total) by mouth 2 (two) times daily. 180 tablet 0   No current facility-administered medications on file prior to visit.    Review of Systems:  As per HPI- otherwise negative.   Physical Examination: Vitals:   11/30/19 1501  BP: 126/72  Pulse: (!) 101  Resp: 18  Temp: 98.4 F (36.9 C)  SpO2: 90%   Vitals:   11/30/19 1501  Weight: 283 lb (128.4 kg)  Height: 5' 7" (1.702 m)   Body mass index is 44.32 kg/m. Ideal Body Weight: Weight in (lb) to have BMI = 25: 159.3  GEN: no acute distress.  Obese, otherwise looks well Occasional cough  HEENT: Atraumatic, Normocephalic.   Bilateral TM wnl, oropharynx normal.  PEERL,EOMI.   Ears and Nose: No external deformity. CV: RRR, No M/G/R. No JVD. No thrill. No extra heart sounds. PULM: CTA B, no wheezes, crackles, rhonchi. No retractions. No resp. distress. No accessory muscle use. EXTR: No c/c/e PSYCH: Normally interactive. Conversant.    Assessment and Plan: Cough - Plan: DISCONTINUED: HYDROcodone-homatropine (HYCODAN) 5-1.5 MG/5ML syrup  Bronchitis - Plan: albuterol (VENTOLIN HFA) 108 (90 Base) MCG/ACT inhaler  Immunization due  Screening for colon cancer  Pt here today with concern of cough and bronchitis sx.  We started her on doxycycline 2 days ago. She notes that that she is feeling better today She is not sleeping well due to cough  She also has some wheezing Will add albuterol inhaler Advised she can stop amox and just take doxycycline Will rx cough syrup for her- ended up sub tussionex as hycodan is on back order  Asked her to let me know if not feeling better in the next few  days Encouraged her to get covi 19 series and to complete her cologaurd kit  This visit occurred during the SARS-CoV-2 public health emergency.  Safety protocols were in place, including screening questions prior to the visit, additional usage of staff PPE, and extensive cleaning of exam room while observing appropriate contact time as indicated for disinfecting solutions.    Signed Lamar Blinks, MD

## 2019-11-30 ENCOUNTER — Telehealth: Payer: Self-pay

## 2019-11-30 ENCOUNTER — Other Ambulatory Visit: Payer: Self-pay

## 2019-11-30 ENCOUNTER — Telehealth: Payer: Self-pay | Admitting: Family Medicine

## 2019-11-30 ENCOUNTER — Encounter: Payer: Self-pay | Admitting: Family Medicine

## 2019-11-30 ENCOUNTER — Ambulatory Visit: Payer: BC Managed Care – PPO | Admitting: Family Medicine

## 2019-11-30 VITALS — BP 126/72 | HR 101 | Temp 98.4°F | Resp 18 | Ht 67.0 in | Wt 283.0 lb

## 2019-11-30 DIAGNOSIS — J40 Bronchitis, not specified as acute or chronic: Secondary | ICD-10-CM

## 2019-11-30 DIAGNOSIS — R059 Cough, unspecified: Secondary | ICD-10-CM

## 2019-11-30 DIAGNOSIS — Z1211 Encounter for screening for malignant neoplasm of colon: Secondary | ICD-10-CM

## 2019-11-30 DIAGNOSIS — Z23 Encounter for immunization: Secondary | ICD-10-CM | POA: Diagnosis not present

## 2019-11-30 MED ORDER — ALBUTEROL SULFATE HFA 108 (90 BASE) MCG/ACT IN AERS: 2.0000 | INHALATION_SPRAY | Freq: Four times a day (QID) | RESPIRATORY_TRACT | 0 refills | Status: DC | PRN

## 2019-11-30 MED ORDER — HYDROCOD POLST-CPM POLST ER 10-8 MG/5ML PO SUER
2.5000 mL | Freq: Two times a day (BID) | ORAL | 0 refills | Status: DC | PRN
Start: 2019-11-30 — End: 2020-01-03

## 2019-11-30 MED ORDER — HYDROCODONE-HOMATROPINE 5-1.5 MG/5ML PO SYRP: 5.0000 mL | ORAL_SOLUTION | Freq: Three times a day (TID) | ORAL | 0 refills | Status: DC | PRN

## 2019-11-30 NOTE — Telephone Encounter (Signed)
Patient spouse called in reference to cough syrup prescribed today . Per patient spouse pharmacy does have the compounding agent to make it. Per pharmacy they will not have it for along. Please  Send another medication into pharmacy.   Medication sent :  HYDROcodone-homatropine (HYCODAN) 5-1.5 MG/5ML syrup [[id number]]

## 2019-11-30 NOTE — Telephone Encounter (Signed)
Pt called stating her pharmacy does not have the hydrocodone-homatropine 1.5 MG. Pt asked if Dr. Lorelei Pont could send in a different medication. Please advise.

## 2019-11-30 NOTE — Telephone Encounter (Signed)
Most places do not have this medication in stock.

## 2019-12-15 ENCOUNTER — Other Ambulatory Visit: Payer: Self-pay | Admitting: Family Medicine

## 2019-12-15 DIAGNOSIS — Z1231 Encounter for screening mammogram for malignant neoplasm of breast: Secondary | ICD-10-CM

## 2020-01-03 ENCOUNTER — Ambulatory Visit (HOSPITAL_BASED_OUTPATIENT_CLINIC_OR_DEPARTMENT_OTHER)
Admission: RE | Admit: 2020-01-03 | Discharge: 2020-01-03 | Disposition: A | Discharge status: Home / Self Care | Payer: BC Managed Care – PPO | Source: Ambulatory Visit | Attending: Internal Medicine | Admitting: Internal Medicine

## 2020-01-03 ENCOUNTER — Ambulatory Visit: Payer: BC Managed Care – PPO | Admitting: Internal Medicine

## 2020-01-03 ENCOUNTER — Other Ambulatory Visit: Payer: Self-pay

## 2020-01-03 VITALS — BP 124/77 | HR 91 | Temp 97.9°F | Ht 67.0 in | Wt 286.8 lb

## 2020-01-03 DIAGNOSIS — M79644 Pain in right finger(s): Secondary | ICD-10-CM

## 2020-01-03 DIAGNOSIS — M65311 Trigger thumb, right thumb: Secondary | ICD-10-CM | POA: Diagnosis not present

## 2020-01-03 DIAGNOSIS — M79641 Pain in right hand: Secondary | ICD-10-CM | POA: Diagnosis not present

## 2020-01-03 MED ORDER — PREDNISONE 10 MG PO TABS: ORAL_TABLET | ORAL | 0 refills | Status: DC

## 2020-01-03 NOTE — Progress Notes (Signed)
Subjective:    Patient ID: Janice Lucas, female    DOB: 12-27-1965, 54 y.o.   MRN: [id number]  DOS:  01/03/2020 Type of visit - description: Acute Symptoms started 2 weeks ago: Developed pain at the right thumb base. Works at [employer], does heavy lifting but does not recall any particular injury. The area has not been swollen, red or warm. Has noted trigger phenomena.   Review of Systems See above   Past Medical History:  Diagnosis Date  . Hypertension   . Sleep apnea     Past Surgical History:  Procedure Laterality Date  . ANTERIOR CERVICAL DECOMP/DISCECTOMY FUSION  02/14/2011   Procedure: ANTERIOR CERVICAL DECOMPRESSION/DISCECTOMY FUSION 1 LEVEL;  Surgeon: Earleen Newport, MD;  Location: [facility];  Service: Neurosurgery;  Laterality: N/A;  Anterior Cervical Six-Seven Decompression and Fusion    Allergies as of 01/03/2020      Reactions   Sulfa Antibiotics Other (See Comments)   Acts "goofy"   Neosporin  [neomycin-bacitracin Zn-polymyx] Rash      Medication List       Accurate as of January 03, 2020  9:25 PM. If you have any questions, ask your nurse or doctor.        STOP taking these medications   chlorpheniramine-HYDROcodone 10-8 MG/5ML Suer Commonly known as: Tussionex Pennkinetic ER Stopped by: Kathlene November, MD   doxycycline 100 MG tablet Commonly known as: VIBRA-TABS Stopped by: Kathlene November, MD     TAKE these medications   acetaminophen 500 MG tablet Commonly known as: TYLENOL Take 500 mg by mouth every 6 (six) hours as needed.   acetaZOLAMIDE 500 MG capsule Commonly known as: DIAMOX Take 2 capsules (1,000 mg total) by mouth 2 (two) times daily.   albuterol 108 (90 Base) MCG/ACT inhaler Commonly known as: VENTOLIN HFA Inhale 2 puffs into the lungs every 6 (six) hours as needed for wheezing or shortness of breath.   aspirin EC 81 MG tablet Take 81 mg by mouth daily.   cholecalciferol 25 MCG (1000 UNIT) tablet Commonly known as: VITAMIN  D3 Take 1,000 Units by mouth daily.   fluticasone 50 MCG/ACT nasal spray Commonly known as: FLONASE Place 2 sprays into both nostrils daily.   lisinopril-hydrochlorothiazide 20-25 MG tablet Commonly known as: ZESTORETIC Take 1 tablet by mouth daily.   nortriptyline 50 MG capsule Commonly known as: PAMELOR Take 2 capsules every night   omeprazole 20 MG capsule Commonly known as: PRILOSEC Take 1 capsule (20 mg total) by mouth daily.   pravastatin 20 MG tablet Commonly known as: PRAVACHOL Take 1 tablet (20 mg total) by mouth daily.   predniSONE 10 MG tablet Commonly known as: DELTASONE 4 tablets x 2 days, 3 tabs x 2 days, 2 tabs x 2 days, 1 tab x 2 days Started by: Kathlene November, MD   topiramate 100 MG tablet Commonly known as: TOPAMAX Take 1 tablet (100 mg total) by mouth 2 (two) times daily.   vitamin C 1000 MG tablet Take 1,000 mg by mouth daily.          Objective:   Physical Exam BP 124/77 (BP Location: Left Arm, Patient Position: Sitting, Cuff Size: Large)   Pulse 91   Temp 97.9 F (36.6 C) (Oral)   Ht 5\' 7"  (1.702 m)   Wt 286 lb 12.8 oz (130.1 kg)   SpO2 100%   BMI 44.92 kg/m  General:   Well developed, NAD, BMI noted. HEENT:  Normocephalic . Face symmetric,  atraumatic MSK: Left hand normal Right hand: Based of the thumb is slightly swollen.  + Trigger phenomenon noted right thumb. No deformities, redness, warmness. Skin: Not pale. Not jaundice Neurologic:  alert & oriented X3.  Speech normal, gait appropriate for age and unassisted Psych--  Cognition and judgment appear intact.  Cooperative with normal attention span and concentration.  Behavior appropriate. No anxious or depressed appearing.      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 Plan: X-ray, prednisone for few days (history of GI upset with NSAIDs), thumb spica for few days, call if no better, Ortho referral?.   This visit occurred during the SARS-CoV-2 public health emergency.  Safety protocols were in place, including screening questions prior to the visit, additional usage of staff PPE, and extensive cleaning of exam room while observing appropriate contact time as indicated for disinfecting solutions.

## 2020-01-03 NOTE — Patient Instructions (Addendum)
Get your x-rays downstairs  Get a  thumb spica splint, use it for few days  Take prednisone as prescribed  Call if not better in the next few days.

## 2020-01-16 ENCOUNTER — Other Ambulatory Visit: Payer: Self-pay

## 2020-01-17 ENCOUNTER — Telehealth: Payer: Self-pay | Admitting: Family Medicine

## 2020-01-17 ENCOUNTER — Encounter: Payer: Self-pay | Admitting: Family Medicine

## 2020-01-17 ENCOUNTER — Ambulatory Visit (INDEPENDENT_AMBULATORY_CARE_PROVIDER_SITE_OTHER): Payer: BC Managed Care – PPO

## 2020-01-17 DIAGNOSIS — Z23 Encounter for immunization: Secondary | ICD-10-CM | POA: Diagnosis not present

## 2020-01-17 DIAGNOSIS — M79644 Pain in right finger(s): Secondary | ICD-10-CM

## 2020-01-17 NOTE — Telephone Encounter (Signed)
Pt saw Dr Drue Novel for this concern.  His notes mention referral to see ortho- will place order

## 2020-01-17 NOTE — Progress Notes (Signed)
Pt is here today for shingrix vaccine. Pt was given shingrix vaccine in left deltoid. Pt tolerated well. 

## 2020-01-27 ENCOUNTER — Ambulatory Visit: Payer: BC Managed Care – PPO | Admitting: Family Medicine

## 2020-01-27 ENCOUNTER — Other Ambulatory Visit: Payer: Self-pay

## 2020-01-27 ENCOUNTER — Encounter: Payer: Self-pay | Admitting: Family Medicine

## 2020-01-27 DIAGNOSIS — M79644 Pain in right finger(s): Secondary | ICD-10-CM | POA: Diagnosis not present

## 2020-01-27 MED ORDER — DICLOFENAC SODIUM 1 % EX GEL: 4.0000 g | Freq: Four times a day (QID) | CUTANEOUS | 6 refills | Status: DC | PRN

## 2020-01-27 NOTE — Progress Notes (Signed)
   Office Visit Note   Patient: Janice Lucas           Date of Birth: [birth year]           MRN: [id number] Visit Date: 01/27/2020 Requested by: Darreld Mclean, MD [address] [place],  [place] [address] PCP: Darreld Mclean, MD  Subjective: Chief Complaint  Patient presents with  . Right Thumb - Pain    NKI. Pain x 3-4 weeks. Was given a prednisone taper by her PCP - this did help some. Swollen. Finger pops with flexion and gets stuck.    HPI: She is here with right thumb pain.  Symptoms started about 3 to 4 weeks ago, no injury.  Pain and popping when she flexes the thumb, it frequently gets stuck.  Not taking medication for it.  No previous problems with the thumb.  She is right-hand dominant and does a lot of repetitive activity at work.               ROS:   All other systems were reviewed and are negative.  Objective: Vital Signs: There were no vitals taken for this visit.  Physical Exam:  General:  Alert and oriented, in no acute distress. Pulm:  Breathing unlabored. Psy:  Normal mood, congruent affect. Skin: No erythema Right thumb: She has triggering at the A1 pulley with active flexion.  Passive flexion of the IP joint and MCP joint does not result in triggering.  No joint effusions.   Imaging: Recent x-rays reviewed on computer show no significant bony abnormality, no spurring.  Assessment & Plan: 1. right trigger thumb -Discussed options and elected to splint DIP joint in full extension for 1 to 2 weeks.  She will use ice to the palm of the hand 2-3 times daily, and Voltaren gel.  If symptoms persist we will inject with cortisone.     Procedures: No procedures performed        PMFS History: Patient Active Problem List   Diagnosis Date Noted  . Hypertension 07/05/2018  . Migraine without aura and without status migrainosus, not intractable 02/08/2016  . Depression 10/10/2015  . Injury by electrocution 10/10/2015  . Mixed  hyperlipidemia 10/10/2015  . Prediabetes 10/10/2015  . Urinary incontinence 10/10/2015  . Migraine headache 08/30/2015  . Sleep apnea 08/30/2015  . IIH (idiopathic intracranial hypertension) 11/14/2013  . Herniated nucleus pulposus, C6-7 right 02/14/2011   Past Medical History:  Diagnosis Date  . Hypertension   . Sleep apnea     Family History  Problem Relation Age of Onset  . Diabetes Maternal Aunt   . Diabetes Maternal Uncle     Past Surgical History:  Procedure Laterality Date  . ANTERIOR CERVICAL DECOMP/DISCECTOMY FUSION  02/14/2011   Procedure: ANTERIOR CERVICAL DECOMPRESSION/DISCECTOMY FUSION 1 LEVEL;  Surgeon: Earleen Newport, MD;  Location: [facility];  Service: Neurosurgery;  Laterality: N/A;  Anterior Cervical Six-Seven Decompression and Fusion   Social History   Occupational History  . Not on file  Tobacco Use  . Smoking status: Former Smoker    Packs/day: 0.50    Types: Cigarettes  . Smokeless tobacco: Never Used  Vaping Use  . Vaping Use: Every day  . Substances: Nicotine, Flavoring  Substance and Sexual Activity  . Alcohol use: No    Comment: occassionally  . Drug use: Yes  . Sexual activity: Not Currently

## 2020-01-27 NOTE — Patient Instructions (Signed)
    Trigger thumb:  - Splint to keep thumb straight, for about a week.  - Ice (frozen water bottle) to palm of hand/thumb 2-3 times daily, 5-10 minutes.  - Voltaren gel 4 times daily to palm of hand/thumb.  - If not improving in a week or two, we can do injection.

## 2020-02-09 DIAGNOSIS — L308 Other specified dermatitis: Secondary | ICD-10-CM | POA: Diagnosis not present

## 2020-02-09 DIAGNOSIS — L821 Other seborrheic keratosis: Secondary | ICD-10-CM | POA: Diagnosis not present

## 2020-02-09 DIAGNOSIS — D225 Melanocytic nevi of trunk: Secondary | ICD-10-CM | POA: Diagnosis not present

## 2020-02-09 DIAGNOSIS — L578 Other skin changes due to chronic exposure to nonionizing radiation: Secondary | ICD-10-CM | POA: Diagnosis not present

## 2020-02-13 ENCOUNTER — Encounter: Payer: Self-pay | Admitting: Family Medicine

## 2020-02-13 ENCOUNTER — Other Ambulatory Visit: Payer: Self-pay

## 2020-02-13 ENCOUNTER — Ambulatory Visit (INDEPENDENT_AMBULATORY_CARE_PROVIDER_SITE_OTHER): Payer: BC Managed Care – PPO | Admitting: Family Medicine

## 2020-02-13 DIAGNOSIS — M79644 Pain in right finger(s): Secondary | ICD-10-CM

## 2020-02-13 NOTE — Progress Notes (Signed)
   Office Visit Note   Patient: Janice Lucas           Date of Birth: 1965-10-01           MRN: [id number] Visit Date: 02/13/2020 Requested by: Darreld Mclean, MD [address] [place],   [address] PCP: Darreld Mclean, MD  Subjective: Chief Complaint  Patient presents with  . Right Thumb - Pain, Follow-up    "about the same"    HPI: She is here for follow-up right thumb pain. It is still triggering despite wearing her splint.              ROS:   All other systems were reviewed and are negative.  Objective: Vital Signs: There were no vitals taken for this visit.  Physical Exam:  General:  Alert and oriented, in no acute distress. Pulm:  Breathing unlabored. Psy:  Normal mood, congruent affect. Skin: No erythema or warmth Right thumb: She has tender nodule and triggering at the A1 pulley.  Imaging: No results found.  Assessment & Plan: 1. Right trigger thumb -Discussed options and she would like an injection today. Follow-up as needed.     Procedures: Right trigger thumb injection: After sterile prep with Betadine, injected 1 cc 0.25% bupivacaine without epinephrine and 3 mg betamethasone into the region of the A1 pulley.       PMFS History: Patient Active Problem List   Diagnosis Date Noted  . Hypertension 07/05/2018  . Migraine without aura and without status migrainosus, not intractable 02/08/2016  . Depression 10/10/2015  . Injury by electrocution 10/10/2015  . Mixed hyperlipidemia 10/10/2015  . Prediabetes 10/10/2015  . Urinary incontinence 10/10/2015  . Migraine headache 08/30/2015  . Sleep apnea 08/30/2015  . IIH (idiopathic intracranial hypertension) 11/14/2013  . Herniated nucleus pulposus, C6-7 right 02/14/2011   Past Medical History:  Diagnosis Date  . Hypertension   . Sleep apnea     Family History  Problem Relation Age of Onset  . Diabetes Maternal Aunt   . Diabetes Maternal Uncle     Past Surgical  History:  Procedure Laterality Date  . ANTERIOR CERVICAL DECOMP/DISCECTOMY FUSION  02/14/2011   Procedure: ANTERIOR CERVICAL DECOMPRESSION/DISCECTOMY FUSION 1 LEVEL;  Surgeon: Earleen Newport, MD;  Location: [facility];  Service: Neurosurgery;  Laterality: N/A;  Anterior Cervical Six-Seven Decompression and Fusion   Social History   Occupational History  . Not on file  Tobacco Use  . Smoking status: Former Smoker    Packs/day: 0.50    Types: Cigarettes  . Smokeless tobacco: Never Used  Vaping Use  . Vaping Use: Every day  . Substances: Nicotine, Flavoring  Substance and Sexual Activity  . Alcohol use: No    Comment: occassionally  . Drug use: Yes  . Sexual activity: Not Currently

## 2020-03-28 ENCOUNTER — Other Ambulatory Visit: Payer: Self-pay

## 2020-03-28 ENCOUNTER — Telehealth (INDEPENDENT_AMBULATORY_CARE_PROVIDER_SITE_OTHER): Payer: BC Managed Care – PPO | Admitting: Internal Medicine

## 2020-03-28 ENCOUNTER — Encounter: Payer: Self-pay | Admitting: Internal Medicine

## 2020-03-28 ENCOUNTER — Encounter: Payer: Self-pay | Admitting: Family Medicine

## 2020-03-28 VITALS — Ht 67.0 in

## 2020-03-28 DIAGNOSIS — R059 Cough, unspecified: Secondary | ICD-10-CM

## 2020-03-28 DIAGNOSIS — J069 Acute upper respiratory infection, unspecified: Secondary | ICD-10-CM

## 2020-03-28 MED ORDER — HYDROCODONE-HOMATROPINE 5-1.5 MG/5ML PO SYRP: 5.0000 mL | ORAL_SOLUTION | Freq: Two times a day (BID) | ORAL | 0 refills | Status: AC | PRN

## 2020-03-28 MED ORDER — AZELASTINE HCL 0.1 % NA SOLN: 2.0000 | Freq: Two times a day (BID) | NASAL | 1 refills | Status: DC

## 2020-03-28 NOTE — Progress Notes (Unsigned)
Subjective:    Patient ID: Janice Lucas, female    DOB: 1965-11-27, 55 y.o.   MRN: [id number]  DOS:  03/28/2020 Type of visit - description: Virtual Visit via Telephone    I connected with above mentioned patient  by telephone and verified that I am speaking with the correct person using two identifiers.  THIS ENCOUNTER IS A VIRTUAL VISIT DUE TO COVID-19 - PATIENT WAS NOT SEEN IN THE OFFICE. PATIENT HAS CONSENTED TO VIRTUAL VISIT / TELEMEDICINE VISIT   Location of patient: home  Location of provider: office  Persons participating in the virtual visit: patient, provider   I discussed the limitations, risks, security and privacy concerns of performing an evaluation and management service by telephone and the availability of in person appointments. I also discussed with the patient that there may be a patient responsible charge related to this service. The patient expressed understanding and agreed to proceed.  Acute Symptoms started yesterday: Sinus congestion, nose congestion, cough, worse at night, some greenish sputum production. She is concerned this will "turn into bronchitis".  Denies fever chills No chest pain no difficulty breathing. No nausea vomiting No unusual aches or pains. Admits to some allergies: Sneezing, watery eyes.      Review of Systems See above   Past Medical History:  Diagnosis Date  . Hypertension   . Sleep apnea     Past Surgical History:  Procedure Laterality Date  . ANTERIOR CERVICAL DECOMP/DISCECTOMY FUSION  02/14/2011   Procedure: ANTERIOR CERVICAL DECOMPRESSION/DISCECTOMY FUSION 1 LEVEL;  Surgeon: Earleen Newport, MD;  Location: [facility];  Service: Neurosurgery;  Laterality: N/A;  Anterior Cervical Six-Seven Decompression and Fusion    Allergies as of 03/28/2020      Reactions   Sulfa Antibiotics Other (See Comments)   Acts "goofy"   Neosporin  [neomycin-bacitracin Zn-polymyx] Rash      Medication List       Accurate as of  March 28, 2020 11:59 PM. If you have any questions, ask your nurse or doctor.        acetaminophen 500 MG tablet Commonly known as: TYLENOL Take 500 mg by mouth every 6 (six) hours as needed.   acetaZOLAMIDE 500 MG capsule Commonly known as: DIAMOX Take 2 capsules (1,000 mg total) by mouth 2 (two) times daily.   albuterol 108 (90 Base) MCG/ACT inhaler Commonly known as: VENTOLIN HFA Inhale 2 puffs into the lungs every 6 (six) hours as needed for wheezing or shortness of breath.   amoxicillin 500 MG capsule Commonly known as: AMOXIL Take 2 capsules (1,000 mg total) by mouth 2 (two) times daily. Started by: Lamar Blinks, MD   aspirin EC 81 MG tablet Take 81 mg by mouth daily.   azelastine 0.1 % nasal spray Commonly known as: ASTELIN Place 2 sprays into both nostrils 2 (two) times daily. Started by: Kathlene November, MD   cholecalciferol 25 MCG (1000 UNIT) tablet Commonly known as: VITAMIN D3 Take 1,000 Units by mouth daily.   diclofenac Sodium 1 % Gel Commonly known as: Voltaren Apply 4 g topically 4 (four) times daily as needed.   fluticasone 50 MCG/ACT nasal spray Commonly known as: FLONASE Place 2 sprays into both nostrils daily.   HYDROcodone-homatropine 5-1.5 MG/5ML syrup Commonly known as: HYCODAN Take 5 mLs by mouth 2 (two) times daily as needed for up to 5 days for cough. Started by: Kathlene November, MD   ketoconazole 2 % cream Commonly known as: NIZORAL APPLY TOPICALLY TWICE  DAILY (TO FEET AND TOE SPACES)   lisinopril-hydrochlorothiazide 20-25 MG tablet Commonly known as: ZESTORETIC Take 1 tablet by mouth daily.   nortriptyline 50 MG capsule Commonly known as: PAMELOR Take 2 capsules every night   omeprazole 20 MG capsule Commonly known as: PRILOSEC Take 1 capsule (20 mg total) by mouth daily.   pravastatin 20 MG tablet Commonly known as: PRAVACHOL Take 1 tablet (20 mg total) by mouth daily.   topiramate 100 MG tablet Commonly known as: TOPAMAX Take 1  tablet (100 mg total) by mouth 2 (two) times daily.   triamcinolone 0.1 % Commonly known as: KENALOG Apply topically 2 (two) times daily.   vitamin C 1000 MG tablet Take 1,000 mg by mouth daily.          Objective:   Physical Exam Ht 5\' 7"  (1.702 m)   BMI 44.92 kg/m  We were unable to make the video work, I I was able to see her for a minute only, looked well, in no distress. Subsequently we continue with a phone visit, she sounded well.  Speaking in complete sentences.     Assessment    URI: Upper respiratory symptoms for 1 day, no fever, chest pain or difficulty breathing.  Does have some sinus congestion and cough. Never had a Covid vaccination Plan: Recommend to check for Covid and let me know, she knows that if this is Covid she could get very sick. Mucinex DM, fluids, Tylenol. Continue Flonase, add Astelin. In the past took Hycodan with good control of symptoms, did not get oversedated, Rx sent. Call if not gradually better.  I discussed the assessment and treatment plan with the patient. The patient was provided an opportunity to ask questions and all were answered. The patient agreed with the plan and demonstrated an understanding of the instructions.   The patient was advised to call back or seek an in-person evaluation if the symptoms worsen or if the condition fails to improve as anticipated.  I provided 21 minutes of non-face-to-face time during this encounter.  Kathlene November, MD

## 2020-03-29 MED ORDER — AMOXICILLIN 500 MG PO CAPS: 1000.0000 mg | ORAL_CAPSULE | Freq: Two times a day (BID) | ORAL | 0 refills | Status: DC

## 2020-05-02 ENCOUNTER — Other Ambulatory Visit: Payer: Self-pay | Admitting: Neurology

## 2020-05-02 DIAGNOSIS — G932 Benign intracranial hypertension: Secondary | ICD-10-CM

## 2020-05-15 ENCOUNTER — Telehealth (INDEPENDENT_AMBULATORY_CARE_PROVIDER_SITE_OTHER): Payer: BC Managed Care – PPO | Admitting: Family

## 2020-05-15 ENCOUNTER — Other Ambulatory Visit: Payer: Self-pay

## 2020-05-15 DIAGNOSIS — J309 Allergic rhinitis, unspecified: Secondary | ICD-10-CM

## 2020-05-15 DIAGNOSIS — J019 Acute sinusitis, unspecified: Secondary | ICD-10-CM | POA: Insufficient documentation

## 2020-05-15 MED ORDER — BENZONATATE 100 MG PO CAPS: 100.0000 mg | ORAL_CAPSULE | Freq: Three times a day (TID) | ORAL | 0 refills | Status: DC | PRN

## 2020-05-15 MED ORDER — FLUCONAZOLE 150 MG PO TABS: ORAL_TABLET | ORAL | 0 refills | Status: DC

## 2020-05-15 MED ORDER — AMOXICILLIN-POT CLAVULANATE 875-125 MG PO TABS: 1.0000 | ORAL_TABLET | Freq: Two times a day (BID) | ORAL | 0 refills | Status: DC

## 2020-05-15 NOTE — Assessment & Plan Note (Addendum)
New. Likely superimposed on her uncontrolled allergic rhinitis.  Will rx with augmentin 875mg  bid. She is requesting rx for cough. Will rx with tessalon 100mg  po tid prn.  She is prone to yeast infections with abx and I have sent an rx for diflucan for her to have on hand. She is advised to call if her symptoms worsen or if they fail to improve. I also advised her to do a home covid test to rule out COVID as well.

## 2020-05-15 NOTE — Progress Notes (Signed)
MyChart Video Visit    Virtual Visit via Video Note   This visit type was conducted due to national recommendations for restrictions regarding the COVID-19 Pandemic (e.g. social distancing) in an effort to limit this patient's exposure and mitigate transmission in our community. This patient is at least at moderate risk for complications without adequate follow up. This format is felt to be most appropriate for this patient at this time. Physical exam was limited by quality of the video and audio technology used for the visit. CMA was able to get the patient set up on a video visit.  Patient location:  Patient and provider in visit Provider location: Office  I discussed the limitations of evaluation and management by telemedicine and the availability of in person appointments. The patient expressed understanding and agreed to proceed.  Visit Date: 05/15/2020  Today's healthcare provider: Nance Pear, NP     Subjective:    Patient ID: Janice Lucas, female    DOB: March 26, 1965, 55 y.o.   MRN: [id number]  Chief Complaint  Patient presents with  . Nasal Congestion    Complains of nasal and synus congestion  . Cough    Complains of persistent cough   . Headache    Complains of headache    HPI Patient is in today to discuss sinus congestion.   Reports nasal congestion x 2 weeks. No improvement with otc antihistamine, ? claritin or zyrtec.  Facial pressure, which started this AM.  +  coughing, itchy eyes.  + voice hoarseness.  She reports that symptoms began on Sunday 05/13/20. She started flonase.  Nasal drainage is white.  She denies associated fever.    Past Medical History:  Diagnosis Date  . Hypertension   . Sleep apnea     Past Surgical History:  Procedure Laterality Date  . ANTERIOR CERVICAL DECOMP/DISCECTOMY FUSION  02/14/2011   Procedure: ANTERIOR CERVICAL DECOMPRESSION/DISCECTOMY FUSION 1 LEVEL;  Surgeon: Earleen Newport, MD;  Location: [facility];   Service: Neurosurgery;  Laterality: N/A;  Anterior Cervical Six-Seven Decompression and Fusion    Family History  Problem Relation Age of Onset  . Diabetes Maternal Aunt   . Diabetes Maternal Uncle     Social History   Socioeconomic History  . Marital status: Single    Spouse name: Not on file  . Number of children: Not on file  . Years of education: Not on file  . Highest education level: Not on file  Occupational History  . Not on file  Tobacco Use  . Smoking status: Former Smoker    Packs/day: 0.50    Types: Cigarettes  . Smokeless tobacco: Never Used  Vaping Use  . Vaping Use: Every day  . Substances: Nicotine, Flavoring  Substance and Sexual Activity  . Alcohol use: No    Comment: occassionally  . Drug use: Yes  . Sexual activity: Not Currently  Other Topics Concern  . Not on file  Social History Narrative   Right Handed   Lives in a one story home   Drinks a big cup of coffee daily   Social Determinants of Health   Financial Resource Strain: Not on file  Food Insecurity: Not on file  Transportation Needs: Not on file  Physical Activity: Not on file  Stress: Not on file  Social Connections: Not on file  Intimate Partner Violence: Not on file    Outpatient Medications Prior to Visit  Medication Sig Dispense Refill  . acetaminophen (TYLENOL)  500 MG tablet Take 500 mg by mouth every 6 (six) hours as needed.    Marland Kitchen acetaZOLAMIDE (DIAMOX) 500 MG capsule Take 2 capsules (1,000 mg total) by mouth 2 (two) times daily. 360 capsule 3  . albuterol (VENTOLIN HFA) 108 (90 Base) MCG/ACT inhaler Inhale 2 puffs into the lungs every 6 (six) hours as needed for wheezing or shortness of breath. 1 each 0  . Ascorbic Acid (VITAMIN C) 1000 MG tablet Take 1,000 mg by mouth daily.    Marland Kitchen aspirin EC 81 MG tablet Take 81 mg by mouth daily.    Marland Kitchen azelastine (ASTELIN) 0.1 % nasal spray Place 2 sprays into both nostrils 2 (two) times daily. 30 mL 1  . cholecalciferol (VITAMIN D3) 25 MCG  (1000 UT) tablet Take 1,000 Units by mouth daily.    . diclofenac Sodium (VOLTAREN) 1 % GEL Apply 4 g topically 4 (four) times daily as needed. 500 g 6  . fluticasone (FLONASE) 50 MCG/ACT nasal spray Place 2 sprays into both nostrils daily. 16 g 6  . ketoconazole (NIZORAL) 2 % cream APPLY TOPICALLY TWICE DAILY (TO FEET AND TOE SPACES)    . lisinopril-hydrochlorothiazide (ZESTORETIC) 20-25 MG tablet Take 1 tablet by mouth daily. 90 tablet 3  . nortriptyline (PAMELOR) 50 MG capsule Take 2 capsules every night 180 capsule 3  . omeprazole (PRILOSEC) 20 MG capsule Take 1 capsule (20 mg total) by mouth daily. 90 capsule 3  . pravastatin (PRAVACHOL) 20 MG tablet Take 1 tablet (20 mg total) by mouth daily. 90 tablet 3  . topiramate (TOPAMAX) 100 MG tablet Take 1 tablet (100 mg total) by mouth 2 (two) times daily. 180 tablet 0  . triamcinolone (KENALOG) 0.1 % Apply topically 2 (two) times daily.    Marland Kitchen amoxicillin (AMOXIL) 500 MG capsule Take 2 capsules (1,000 mg total) by mouth 2 (two) times daily. 40 capsule 0   No facility-administered medications prior to visit.    Allergies  Allergen Reactions  . Sulfa Antibiotics Other (See Comments)    Acts "goofy"  . Neosporin  [Neomycin-Bacitracin Zn-Polymyx] Rash    ROS     Objective:    Physical Exam Constitutional:      Appearance: She is well-developed.  Pulmonary:     Effort: Pulmonary effort is normal.  Neurological:     Mental Status: She is alert.  Psychiatric:        Attention and Perception: Attention normal.        Mood and Affect: Mood normal.        Speech: Speech normal.        Behavior: Behavior normal.        Cognition and Memory: Cognition normal.     There were no vitals taken for this visit. Wt Readings from Last 3 Encounters:  01/03/20 286 lb 12.8 oz (130.1 kg)  11/30/19 283 lb (128.4 kg)  09/16/19 260 lb (117.9 kg)    Diabetic Foot Exam - Simple   No data filed    Lab Results  Component Value Date   WBC 10.8  11/15/2019   HGB 13.6 11/15/2019   HCT 40.5 11/15/2019   PLT 307 11/15/2019   GLUCOSE 92 11/15/2019   CHOL 187 11/15/2019   TRIG 127 11/15/2019   HDL 53 11/15/2019   LDLCALC 110 (H) 11/15/2019   ALT 13 11/15/2019   AST 13 11/15/2019   NA 138 11/15/2019   K 3.5 11/15/2019   CL 101 11/15/2019   CREATININE 0.84 11/15/2019  BUN 17 11/15/2019   CO2 23 11/15/2019   TSH 1.87 07/13/2018   HGBA1C 6.1 (H) 11/15/2019    Lab Results  Component Value Date   TSH 1.87 07/13/2018   Lab Results  Component Value Date   WBC 10.8 11/15/2019   HGB 13.6 11/15/2019   HCT 40.5 11/15/2019   MCV 89.0 11/15/2019   PLT 307 11/15/2019   Lab Results  Component Value Date   NA 138 11/15/2019   K 3.5 11/15/2019   CO2 23 11/15/2019   GLUCOSE 92 11/15/2019   BUN 17 11/15/2019   CREATININE 0.84 11/15/2019   BILITOT 0.4 11/15/2019   ALKPHOS 57 07/13/2018   AST 13 11/15/2019   ALT 13 11/15/2019   PROT 7.3 11/15/2019   ALBUMIN 4.4 07/13/2018   CALCIUM 9.1 11/15/2019   GFR 63.13 07/13/2018   Lab Results  Component Value Date   CHOL 187 11/15/2019   Lab Results  Component Value Date   HDL 53 11/15/2019   Lab Results  Component Value Date   LDLCALC 110 (H) 11/15/2019   Lab Results  Component Value Date   TRIG 127 11/15/2019   Lab Results  Component Value Date   CHOLHDL 3.5 11/15/2019   Lab Results  Component Value Date   HGBA1C 6.1 (H) 11/15/2019       Assessment & Plan:   Problem List Items Addressed This Visit   None     I have discontinued Ivin Booty A. Gravley's amoxicillin. I am also having her maintain her acetaminophen, aspirin EC, vitamin C, cholecalciferol, acetaZOLAMIDE, fluticasone, lisinopril-hydrochlorothiazide, pravastatin, omeprazole, topiramate, nortriptyline, albuterol, diclofenac Sodium, triamcinolone cream, ketoconazole, and azelastine.  No orders of the defined types were placed in this encounter.   I discussed the assessment and treatment plan with the  patient. The patient was provided an opportunity to ask questions and all were answered. The patient agreed with the plan and demonstrated an understanding of the instructions.   The patient was advised to call back or seek an in-person evaluation if the symptoms worsen or if the condition fails to improve as anticipated.    Nance Pear, NP [employer] [facility] [employer] [phone] (phone) [phone] (fax)  [place]

## 2020-05-15 NOTE — Assessment & Plan Note (Signed)
Uncontrolled. Advised pt to continue flonase and to restart a different antihistamine once daily. For example, if she was taking claritin without improvement she can start zyrtec 10mg  once daily.

## 2020-05-16 ENCOUNTER — Encounter: Payer: Self-pay | Admitting: Family Medicine

## 2020-05-16 MED ORDER — HYDROCODONE BIT-HOMATROP MBR 5-1.5 MG/5ML PO SOLN: 5.0000 mL | Freq: Three times a day (TID) | ORAL | 0 refills | Status: AC | PRN

## 2020-05-21 ENCOUNTER — Other Ambulatory Visit: Payer: Self-pay | Admitting: Neurology

## 2020-05-21 DIAGNOSIS — G932 Benign intracranial hypertension: Secondary | ICD-10-CM

## 2020-05-28 ENCOUNTER — Other Ambulatory Visit: Payer: Self-pay

## 2020-05-28 DIAGNOSIS — G932 Benign intracranial hypertension: Secondary | ICD-10-CM

## 2020-05-28 MED ORDER — ACETAZOLAMIDE ER 500 MG PO CP12: 1000.0000 mg | ORAL_CAPSULE | Freq: Two times a day (BID) | ORAL | 0 refills | Status: DC

## 2020-05-28 NOTE — Telephone Encounter (Signed)
Patient called in and scheduled appointment for 10/01/20. Please send in prescription for acetazolamide to [facility]

## 2020-06-19 DIAGNOSIS — I10 Essential (primary) hypertension: Secondary | ICD-10-CM | POA: Diagnosis not present

## 2020-06-19 DIAGNOSIS — J309 Allergic rhinitis, unspecified: Secondary | ICD-10-CM | POA: Diagnosis not present

## 2020-06-19 DIAGNOSIS — E559 Vitamin D deficiency, unspecified: Secondary | ICD-10-CM | POA: Diagnosis not present

## 2020-06-19 DIAGNOSIS — G4733 Obstructive sleep apnea (adult) (pediatric): Secondary | ICD-10-CM | POA: Diagnosis not present

## 2020-06-28 ENCOUNTER — Encounter: Payer: Self-pay | Admitting: Family Medicine

## 2020-08-02 ENCOUNTER — Other Ambulatory Visit: Payer: Self-pay | Admitting: Neurology

## 2020-08-02 DIAGNOSIS — G932 Benign intracranial hypertension: Secondary | ICD-10-CM

## 2020-08-16 DIAGNOSIS — G932 Benign intracranial hypertension: Secondary | ICD-10-CM | POA: Diagnosis not present

## 2020-08-16 DIAGNOSIS — H2513 Age-related nuclear cataract, bilateral: Secondary | ICD-10-CM | POA: Diagnosis not present

## 2020-08-22 ENCOUNTER — Other Ambulatory Visit: Payer: Self-pay | Admitting: Neurology

## 2020-09-01 ENCOUNTER — Other Ambulatory Visit: Payer: Self-pay | Admitting: Family Medicine

## 2020-09-01 DIAGNOSIS — E785 Hyperlipidemia, unspecified: Secondary | ICD-10-CM

## 2020-09-01 DIAGNOSIS — I10 Essential (primary) hypertension: Secondary | ICD-10-CM

## 2020-09-03 DIAGNOSIS — H3561 Retinal hemorrhage, right eye: Secondary | ICD-10-CM | POA: Diagnosis not present

## 2020-09-03 DIAGNOSIS — H33332 Multiple defects of retina without detachment, left eye: Secondary | ICD-10-CM | POA: Diagnosis not present

## 2020-09-03 DIAGNOSIS — H43813 Vitreous degeneration, bilateral: Secondary | ICD-10-CM | POA: Diagnosis not present

## 2020-09-03 DIAGNOSIS — H35463 Secondary vitreoretinal degeneration, bilateral: Secondary | ICD-10-CM | POA: Diagnosis not present

## 2020-10-01 ENCOUNTER — Encounter: Payer: Self-pay | Admitting: Neurology

## 2020-10-01 ENCOUNTER — Telehealth (INDEPENDENT_AMBULATORY_CARE_PROVIDER_SITE_OTHER): Payer: BC Managed Care – PPO | Admitting: Neurology

## 2020-10-01 ENCOUNTER — Other Ambulatory Visit: Payer: Self-pay

## 2020-10-01 VITALS — Ht 67.0 in | Wt 285.0 lb

## 2020-10-01 DIAGNOSIS — G43009 Migraine without aura, not intractable, without status migrainosus: Secondary | ICD-10-CM

## 2020-10-01 DIAGNOSIS — G932 Benign intracranial hypertension: Secondary | ICD-10-CM

## 2020-10-01 MED ORDER — NORTRIPTYLINE HCL 50 MG PO CAPS: ORAL_CAPSULE | ORAL | 3 refills | Status: DC

## 2020-10-01 MED ORDER — ACETAZOLAMIDE ER 500 MG PO CP12: 1000.0000 mg | ORAL_CAPSULE | Freq: Two times a day (BID) | ORAL | 3 refills | Status: DC

## 2020-10-01 MED ORDER — TOPIRAMATE 100 MG PO TABS: 100.0000 mg | ORAL_TABLET | Freq: Two times a day (BID) | ORAL | 3 refills | Status: DC

## 2020-10-01 NOTE — Progress Notes (Signed)
Virtual Visit via Video Note The purpose of this virtual visit is to provide medical care while limiting exposure to the novel coronavirus.    Consent was obtained for video visit:  Yes.   Answered questions that patient had about telehealth interaction:  Yes.   I discussed the limitations, risks, security and privacy concerns of performing an evaluation and management service by telemedicine. I also discussed with the patient that there may be a patient responsible charge related to this service. The patient expressed understanding and agreed to proceed.  Pt location: Private vehicle Physician Location: office Name of referring provider:  Copland, Gay Filler, MD I connected with Janice Lucas at patients initiation/request on 10/01/2020 at  4:00 PM EDT by video enabled telemedicine application and verified that I am speaking with the correct person using two identifiers. Pt MRN:  [id number] Pt DOB:  08-28-1965 Video Participants:  Janice Lucas   History of Present Illness:  The patient had a virtual video visit on 10/08/2020. She was last seen a year ago for pseudotumor cerebri and migraines. On her last visit, she was reporting an increase in headaches to 1-2 times a week. Nortriptyline dose increased to '100mg'$  qhs. She also continues on Diamox '1000mg'$  BID, Topiramate '1000mg'$  BID. She reports doing well with the headaches, occurring 1-2 times a month. Last headache was a week ago, 2 Tylenol knocks it out. No dizziness. She had a vision exam recently and was told it was good, no papilledema noted. Sleep is pretty good. She denies any focal numbness/tingling/weakness, no falls. No side effects on medications.  History on Initial Assessment 02/08/2016: This is a pleasant 55 yo RH woman with a history of pseudotumor cerebri diagnosed in 2015. Records from [facility] were reviewed, she presented to her eye doctor for a routine exam in April 2015 with a 106-monthhistory of headaches  thinking she may need new glasses. She was noted to have mild papilledema, right > left. At that time, headaches were behind her eyes and at the vertex, daily, worsened by bending, lifting, coughing, and sneezing. MRI and MRV brain were reported as normal. She had a lumbar puncture with opening pressure of 25 in the lateral position. They report problems with her LP, she had a post-spinal tap headache that initial blood patch did not help with. She was started on Topamax and Diamox, and nortriptyline was added for migraine prevention. She was doing very well on this regimen with rare headaches. Her neurologist Dr. KBaltazar Najjarleft the practice and she saw Dr. MErmalene Postinlast October, with note of improvement in eye exam from last visit, no evidence of papilledema, and rare headaches. They discussed reducing some of her medications, Topamax was reduced to '100mg'$  daily. With reduction in Topamax, she noticed an increase in headaches, particularly in the morning. She then lost her insurance and was unable to obtain the Diamox. Without this, headaches became much worse. She describes them as diffuse sharp pain, with occasional photo and phonophobia when severe, no nausea/vomiting. Bending over causes floaters in her vision. She has intermittent tinnitus (non-pulsatile). She feels her vision is getting worse, she is straining more to use the computer with blurry vision, no loss of vision. No driving difficulties with peripheral vision.    She has been back on the Diamox after seeing her PCP, currently on uptitration taking '500mg'$  BID, then increasing back to original dose of '1000mg'$  BID. She had some paresthesias on the Diamox, and found that bananas help with  this. She denies any dysarthria/dysphagia, neck/back pain, bowel/bladder dysfunction, dizziness, no falls. She reports some numbness in the three fingers of her right hand, they feel cold suddenly ("like ice cubes") and hurt. 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     Current Outpatient Medications on File Prior to Visit  Medication Sig Dispense Refill   acetaminophen (TYLENOL) 500 MG tablet Take 500 mg by mouth every 6 (six) hours as needed.     acetaZOLAMIDE (DIAMOX) 500 MG capsule Take 2 capsules (1,000 mg total) by mouth 2 (two) times daily. 360 capsule 0   albuterol (VENTOLIN HFA) 108 (90 Base) MCG/ACT inhaler Inhale 2 puffs into the lungs every 6 (six) hours as needed for wheezing or shortness of breath. 1 each 0   Ascorbic Acid (VITAMIN C) 1000 MG tablet Take 1,000 mg by mouth daily.     aspirin EC 81 MG tablet Take 81 mg by mouth daily.     azelastine (ASTELIN) 0.1 % nasal spray Place 2 sprays into both nostrils 2 (two) times daily. 30 mL 1   cholecalciferol (VITAMIN D3) 25 MCG (1000 UT) tablet Take 1,000 Units by mouth daily.     diclofenac Sodium (VOLTAREN) 1 % GEL Apply 4 g topically 4 (four) times daily as needed. 500 g 6   fluticasone (FLONASE) 50 MCG/ACT nasal spray Place 2 sprays into both nostrils daily. 16 g 6   ketoconazole (NIZORAL) 2 % cream APPLY TOPICALLY TWICE DAILY (TO FEET AND TOE SPACES)     lisinopril-hydrochlorothiazide (ZESTORETIC) 20-25 MG tablet TAKE 1 TABLET BY MOUTH EVERY DAY 30 tablet 0   nortriptyline (PAMELOR) 50 MG capsule TAKE 2 CAPSULES BY MOUTH EVERY NIGHT AS DIRECTED 180 capsule 3   omeprazole (PRILOSEC) 20 MG capsule Take 1 capsule (20 mg total) by mouth daily. 90 capsule 3   pravastatin (PRAVACHOL) 20 MG tablet TAKE 1 TABLET BY MOUTH EVERY DAY FOR CHOLESTEROL 90 tablet 0   topiramate (TOPAMAX) 100 MG tablet TAKE 1 TABLET BY MOUTH 2 TIMES A DAY 120 tablet 0   vitamin E 1000 UNIT capsule Take 1,000 Units by mouth daily.     benzonatate (TESSALON) 100 MG capsule Take 1 capsule (100 mg total) by mouth 3 (three) times daily as needed. (Patient not taking: Reported on 10/01/2020) 20 capsule 0   No current facility-administered medications on file prior to visit.      Observations/Objective:   Vitals:   10/01/20 1538  Weight: 285 lb (129.3 kg)  Height: '5\' 7"'$  (1.702 m)   GEN:  The patient appears stated age and is in NAD.  Neurological examination: Patient is awake, alert. No aphasia or dysarthria. Intact fluency and comprehension. Cranial nerves: Extraocular movements intact with no nystagmus. No facial asymmetry. Motor: moves all extremities symmetrically, at least anti-gravity x 4.    Assessment and Plan:   This is a pleasant 55 yo RH woman with a history of migraines and pseudotumor cerebri diagnosed in 2015. Prior attempts to wean Diamox caused increase in headaches. Headaches stable on Diamox '1000mg'$  BID, Topiramate '100mg'$  BID, and nortriptyline '100mg'$  qhs, refills sent. Continue follow-up with eye doctor. Follow-up in 1 year, call for any changes.    Follow Up Instructions:   -I discussed the assessment and treatment plan with the patient. The patient was provided an opportunity to ask questions and all were answered. The patient agreed with the plan and demonstrated an understanding of the instructions.   The patient  was advised to call back or seek an in-person evaluation if the symptoms worsen or if the condition fails to improve as anticipated.    Cameron Sprang, MD

## 2020-10-09 NOTE — Patient Instructions (Signed)
Good to see you! Continue all your medications. Follow-up in 1 year, call for any changes.  

## 2020-11-23 ENCOUNTER — Telehealth (INDEPENDENT_AMBULATORY_CARE_PROVIDER_SITE_OTHER): Payer: BC Managed Care – PPO | Admitting: Registered Nurse

## 2020-11-23 ENCOUNTER — Other Ambulatory Visit: Payer: Self-pay

## 2020-11-23 ENCOUNTER — Encounter: Payer: Self-pay | Admitting: Registered Nurse

## 2020-11-23 DIAGNOSIS — J069 Acute upper respiratory infection, unspecified: Secondary | ICD-10-CM

## 2020-11-23 MED ORDER — DM-GUAIFENESIN ER 30-600 MG PO TB12: 1.0000 | ORAL_TABLET | Freq: Two times a day (BID) | ORAL | 0 refills | Status: DC

## 2020-11-23 MED ORDER — AMOXICILLIN-POT CLAVULANATE 875-125 MG PO TABS: 1.0000 | ORAL_TABLET | Freq: Two times a day (BID) | ORAL | 0 refills | Status: DC

## 2020-11-23 MED ORDER — BENZONATATE 100 MG PO CAPS: 100.0000 mg | ORAL_CAPSULE | Freq: Three times a day (TID) | ORAL | 0 refills | Status: DC | PRN

## 2020-11-23 MED ORDER — BENZONATATE 200 MG PO CAPS: 200.0000 mg | ORAL_CAPSULE | Freq: Three times a day (TID) | ORAL | 0 refills | Status: DC | PRN

## 2020-11-23 NOTE — Progress Notes (Signed)
Telemedicine Encounter- SOAP NOTE Established Patient  This telephone encounter was conducted with the patient's (or proxy's) verbal consent via audio telecommunications: yes/no: Yes Patient was instructed to have this encounter in a suitably private space; and to only have persons present to whom they give permission to participate. In addition, patient identity was confirmed by use of name plus two identifiers (DOB and address).  I discussed the limitations, risks, security and privacy concerns of performing an evaluation and management service by telephone and the availability of in person appointments. I also discussed with the patient that there may be a patient responsible charge related to this service. The patient expressed understanding and agreed to proceed.  I spent a total of 14 minutes talking with the patient or their proxy.  Patient at home Provider in office  Participants: Janice Ruddy, NP and Janice Lucas  Chief Complaint  Patient presents with   Cough    Patient states she has been having some sinus drainage and cough for about 3-2 days. Pt states she has been taking allergy medication but it feels like a sinus infection coming on.    Subjective   Janice Lucas is a 55 y.o. established patient. Telephone visit today for cough  HPI Onset 3 days ago - onset unilaterally Scratchy throat into cough, sinus congestion, pnd. Has been taking OTC antihistamines, limited effect.  Feels like previous sinus infections, concerned this will develop into bacterial infection.  Sick contact - partner - was sick for around one week then needed abx to start relief.  Has not home tested for covid No myalgias   Patient Active Problem List   Diagnosis Date Noted   Allergic rhinitis 05/15/2020   Acute sinusitis 05/15/2020   Hypertension 07/05/2018   Migraine without aura and without status migrainosus, not intractable 02/08/2016   Depression 10/10/2015   Injury by  electrocution 10/10/2015   Mixed hyperlipidemia 10/10/2015   Prediabetes 10/10/2015   Urinary incontinence 10/10/2015   Migraine headache 08/30/2015   Sleep apnea 08/30/2015   IIH (idiopathic intracranial hypertension) 11/14/2013   Herniated nucleus pulposus, C6-7 right 02/14/2011    Past Medical History:  Diagnosis Date   Hypertension    Sleep apnea     Current Outpatient Medications  Medication Sig Dispense Refill   acetaminophen (TYLENOL) 500 MG tablet Take 500 mg by mouth every 6 (six) hours as needed.     acetaZOLAMIDE ER (DIAMOX) 500 MG capsule Take 2 capsules (1,000 mg total) by mouth 2 (two) times daily. 360 capsule 3   albuterol (VENTOLIN HFA) 108 (90 Base) MCG/ACT inhaler Inhale 2 puffs into the lungs every 6 (six) hours as needed for wheezing or shortness of breath. 1 each 0   amoxicillin-clavulanate (AUGMENTIN) 875-125 MG tablet Take 1 tablet by mouth 2 (two) times daily. 20 tablet 0   Ascorbic Acid (VITAMIN C) 1000 MG tablet Take 1,000 mg by mouth daily.     aspirin EC 81 MG tablet Take 81 mg by mouth daily.     azelastine (ASTELIN) 0.1 % nasal spray Place 2 sprays into both nostrils 2 (two) times daily. 30 mL 1   benzonatate (TESSALON) 200 MG capsule Take 1 capsule (200 mg total) by mouth 3 (three) times daily as needed for cough. 20 capsule 0   cholecalciferol (VITAMIN D3) 25 MCG (1000 UT) tablet Take 1,000 Units by mouth daily.     dextromethorphan-guaiFENesin (MUCINEX DM) 30-600 MG 12hr tablet Take 1 tablet by mouth 2 (two)  times daily. 20 tablet 0   diclofenac Sodium (VOLTAREN) 1 % GEL Apply 4 g topically 4 (four) times daily as needed. 500 g 6   fluticasone (FLONASE) 50 MCG/ACT nasal spray Place 2 sprays into both nostrils daily. 16 g 6   ketoconazole (NIZORAL) 2 % cream APPLY TOPICALLY TWICE DAILY (TO FEET AND TOE SPACES)     lisinopril-hydrochlorothiazide (ZESTORETIC) 20-25 MG tablet TAKE 1 TABLET BY MOUTH EVERY DAY 30 tablet 0   nortriptyline (PAMELOR) 50 MG  capsule TAKE 2 CAPSULES BY MOUTH EVERY NIGHT AS DIRECTED 180 capsule 3   omeprazole (PRILOSEC) 20 MG capsule Take 1 capsule (20 mg total) by mouth daily. 90 capsule 3   pravastatin (PRAVACHOL) 20 MG tablet TAKE 1 TABLET BY MOUTH EVERY DAY FOR CHOLESTEROL 90 tablet 0   topiramate (TOPAMAX) 100 MG tablet Take 1 tablet (100 mg total) by mouth 2 (two) times daily. 180 tablet 3   vitamin E 1000 UNIT capsule Take 1,000 Units by mouth daily.     benzonatate (TESSALON) 100 MG capsule Take 1 capsule (100 mg total) by mouth 3 (three) times daily as needed. 20 capsule 0   No current facility-administered medications for this visit.    Allergies  Allergen Reactions   Sulfa Antibiotics Other (See Comments)    Acts "goofy"   Neosporin  [Neomycin-Bacitracin Zn-Polymyx] Rash    Social History   Socioeconomic History   Marital status: Single    Spouse name: Not on file   Number of children: Not on file   Years of education: Not on file   Highest education level: Not on file  Occupational History   Not on file  Tobacco Use   Smoking status: Former    Packs/day: 0.50    Types: Cigarettes   Smokeless tobacco: Never  Vaping Use   Vaping Use: Every day   Substances: Nicotine, Flavoring  Substance and Sexual Activity   Alcohol use: No    Comment: occassionally   Drug use: Not Currently   Sexual activity: Not Currently  Other Topics Concern   Not on file  Social History Narrative   Right Handed   Lives in a one story home   Drinks a big cup of coffee daily   Social Determinants of Health   Financial Resource Strain: Not on file  Food Insecurity: Not on file  Transportation Needs: Not on file  Physical Activity: Not on file  Stress: Not on file  Social Connections: Not on file  Intimate Partner Violence: Not on file    ROS Per hpi   Objective   Vitals as reported by the patient: There were no vitals filed for this visit.  [name] was seen today for cough.  Diagnoses and all  orders for this visit:  Viral upper respiratory tract infection -     amoxicillin-clavulanate (AUGMENTIN) 875-125 MG tablet; Take 1 tablet by mouth 2 (two) times daily. -     benzonatate (TESSALON) 200 MG capsule; Take 1 capsule (200 mg total) by mouth 3 (three) times daily as needed for cough. -     benzonatate (TESSALON) 100 MG capsule; Take 1 capsule (100 mg total) by mouth 3 (three) times daily as needed. -     dextromethorphan-guaiFENesin (MUCINEX DM) 30-600 MG 12hr tablet; Take 1 tablet by mouth 2 (two) times daily.   PLAN Start with azelastine and antihistamine, combine with flonase. Ok to use mucinex dm for cough, tessalon for cough.  If no relief in 3-4 days  can pick up and start abx. Reviewed risks of starting abx early. Pt voices understanding. Return if worsening or failing to improve within 1 week. Patient encouraged to call clinic with any questions, comments, or concerns.  I discussed the assessment and treatment plan with the patient. The patient was provided an opportunity to ask questions and all were answered. The patient agreed with the plan and demonstrated an understanding of the instructions.   The patient was advised to call back or seek an in-person evaluation if the symptoms worsen or if the condition fails to improve as anticipated.  I provided 14 minutes of non-face-to-face time during this encounter.  Maximiano Coss, NP

## 2020-11-23 NOTE — Patient Instructions (Signed)
° ° ° °  If you have lab work done today you will be contacted with your lab results within the next 2 weeks.  If you have not heard from us then please contact us. The fastest way to get your results is to register for My Chart. ° ° °IF you received an x-ray today, you will receive an invoice from [facility]. Please contact [facility] at [phone] with questions or concerns regarding your invoice.  ° °IF you received labwork today, you will receive an invoice from [facility]. Please contact [facility] at [phone] with questions or concerns regarding your invoice.  ° °Our billing staff will not be able to assist you with questions regarding bills from these companies. ° °You will be contacted with the lab results as soon as they are available. The fastest way to get your results is to activate your My Chart account. Instructions are located on the last page of this paperwork. If you have not heard from us regarding the results in 2 weeks, please contact this office. °  ° ° ° °

## 2020-12-14 ENCOUNTER — Encounter: Payer: Self-pay | Admitting: Family

## 2020-12-14 ENCOUNTER — Telehealth (INDEPENDENT_AMBULATORY_CARE_PROVIDER_SITE_OTHER): Payer: BC Managed Care – PPO | Admitting: Family

## 2020-12-14 DIAGNOSIS — J069 Acute upper respiratory infection, unspecified: Secondary | ICD-10-CM | POA: Insufficient documentation

## 2020-12-14 MED ORDER — PROMETHAZINE-DM 6.25-15 MG/5ML PO SYRP: 5.0000 mL | ORAL_SOLUTION | Freq: Four times a day (QID) | ORAL | 0 refills | Status: DC | PRN

## 2020-12-14 NOTE — Progress Notes (Signed)
MyChart Video Visit    Virtual Visit via Video Note   This visit type was conducted due to national recommendations for restrictions regarding the COVID-19 Pandemic (e.g. social distancing) in an effort to limit this patient's exposure and mitigate transmission in our community. This patient is at least at moderate risk for complications without adequate follow up. This format is felt to be most appropriate for this patient at this time. Physical exam was limited by quality of the video and audio technology used for the visit. Esau Grew.  was able to get the patient set up on a video visit.  Patient location: Home Patient and provider in visit Provider location: Office  I discussed the limitations of evaluation and management by telemedicine and the availability of in person appointments. The patient expressed understanding and agreed to proceed.  Visit Date: 12/14/2020  Today's healthcare provider: Nance Pear, NP     Subjective:    Patient ID: Janice Lucas, female    DOB: [birth year], 55 y.o.   MRN: [id number]  Chief Complaint  Patient presents with   Congestion    X3-4 days, Pt report headache, congestion, productive cough, no fever. No COVID test.     HPI Patient is in today for a video visit.  Congestion- She reports that since Tuesday 11/29, she has had a cough,congestion, sinus pressure and headache. She has been using using mucinex and it has been helping. She has not had a Covid-19 test. Adds that tylenol has not been helping and she has been using Flonase to manage her breathing. Multiple family member have similar symptoms.   Past Medical History:  Diagnosis Date   Hypertension    Sleep apnea     Past Surgical History:  Procedure Laterality Date   ANTERIOR CERVICAL DECOMP/DISCECTOMY FUSION  02/14/2011   Procedure: ANTERIOR CERVICAL DECOMPRESSION/DISCECTOMY FUSION 1 LEVEL;  Surgeon: Earleen Newport, MD;  Location: [facility];  Service:  Neurosurgery;  Laterality: N/A;  Anterior Cervical Six-Seven Decompression and Fusion    Family History  Problem Relation Age of Onset   Diabetes Maternal Aunt    Diabetes Maternal Uncle     Social History   Socioeconomic History   Marital status: Single    Spouse name: Not on file   Number of children: Not on file   Years of education: Not on file   Highest education level: Not on file  Occupational History   Not on file  Tobacco Use   Smoking status: Former    Packs/day: 0.50    Types: Cigarettes   Smokeless tobacco: Never  Vaping Use   Vaping Use: Every day   Substances: Nicotine, Flavoring  Substance and Sexual Activity   Alcohol use: No    Comment: occassionally   Drug use: Not Currently   Sexual activity: Not Currently  Other Topics Concern   Not on file  Social History Narrative   Right Handed   Lives in a one story home   Drinks a big cup of coffee daily   Social Determinants of Health   Financial Resource Strain: Not on file  Food Insecurity: Not on file  Transportation Needs: Not on file  Physical Activity: Not on file  Stress: Not on file  Social Connections: Not on file  Intimate Partner Violence: Not on file    Outpatient Medications Prior to Visit  Medication Sig Dispense Refill   acetaminophen (TYLENOL) 500 MG tablet Take 500 mg by mouth every 6 (six)  hours as needed.     acetaZOLAMIDE ER (DIAMOX) 500 MG capsule Take 2 capsules (1,000 mg total) by mouth 2 (two) times daily. 360 capsule 3   albuterol (VENTOLIN HFA) 108 (90 Base) MCG/ACT inhaler Inhale 2 puffs into the lungs every 6 (six) hours as needed for wheezing or shortness of breath. 1 each 0   amoxicillin-clavulanate (AUGMENTIN) 875-125 MG tablet Take 1 tablet by mouth 2 (two) times daily. 20 tablet 0   Ascorbic Acid (VITAMIN C) 1000 MG tablet Take 1,000 mg by mouth daily.     aspirin EC 81 MG tablet Take 81 mg by mouth daily.     azelastine (ASTELIN) 0.1 % nasal spray Place 2 sprays into  both nostrils 2 (two) times daily. 30 mL 1   cholecalciferol (VITAMIN D3) 25 MCG (1000 UT) tablet Take 1,000 Units by mouth daily.     dextromethorphan-guaiFENesin (MUCINEX DM) 30-600 MG 12hr tablet Take 1 tablet by mouth 2 (two) times daily. 20 tablet 0   diclofenac Sodium (VOLTAREN) 1 % GEL Apply 4 g topically 4 (four) times daily as needed. 500 g 6   fluticasone (FLONASE) 50 MCG/ACT nasal spray Place 2 sprays into both nostrils daily. 16 g 6   ketoconazole (NIZORAL) 2 % cream APPLY TOPICALLY TWICE DAILY (TO FEET AND TOE SPACES)     lisinopril-hydrochlorothiazide (ZESTORETIC) 20-25 MG tablet TAKE 1 TABLET BY MOUTH EVERY DAY 30 tablet 0   nortriptyline (PAMELOR) 50 MG capsule TAKE 2 CAPSULES BY MOUTH EVERY NIGHT AS DIRECTED 180 capsule 3   omeprazole (PRILOSEC) 20 MG capsule Take 1 capsule (20 mg total) by mouth daily. 90 capsule 3   pravastatin (PRAVACHOL) 20 MG tablet TAKE 1 TABLET BY MOUTH EVERY DAY FOR CHOLESTEROL 90 tablet 0   topiramate (TOPAMAX) 100 MG tablet Take 1 tablet (100 mg total) by mouth 2 (two) times daily. 180 tablet 3   vitamin E 1000 UNIT capsule Take 1,000 Units by mouth daily.     benzonatate (TESSALON) 100 MG capsule Take 1 capsule (100 mg total) by mouth 3 (three) times daily as needed. 20 capsule 0   benzonatate (TESSALON) 200 MG capsule Take 1 capsule (200 mg total) by mouth 3 (three) times daily as needed for cough. 20 capsule 0   No facility-administered medications prior to visit.    Allergies  Allergen Reactions   Sulfa Antibiotics Other (See Comments)    Acts "goofy"   Neosporin  [Neomycin-Bacitracin Zn-Polymyx] Rash    Review of Systems  HENT:  Positive for congestion and sinus pain.   Respiratory:  Positive for cough.   Neurological:  Positive for headaches.      Objective:    Physical Exam Constitutional:      General: She is not in acute distress.    Appearance: Normal appearance.  HENT:     Nose: Congestion present.  Pulmonary:     Effort:  Pulmonary effort is normal.  Neurological:     Mental Status: She is alert and oriented to person, place, and time.  Psychiatric:        Mood and Affect: Mood normal.        Behavior: Behavior normal.        Thought Content: Thought content normal.    There were no vitals taken for this visit. Wt Readings from Last 3 Encounters:  10/01/20 285 lb (129.3 kg)  01/03/20 286 lb 12.8 oz (130.1 kg)  11/30/19 283 lb (128.4 kg)      Assessment &  Plan:   Problem List Items Addressed This Visit       Unprioritized   Viral URI with cough - Primary    New. Will plan to treat with tylenol as needed for headache, fever. For congestion, continue mucinex add nasal saline rinses daily to be followed by her flonase 2 sprays each nostril.For cough, add promethazine DM PRN. Advised pt to take a home covid test and let us know if positive. Also, she is advised to call if she develops new/worsening symptoms or if symptoms are not improved in 3-4 days.  Pt verbalizes understanding.         Meds ordered this encounter  Medications   promethazine-dextromethorphan (PROMETHAZINE-DM) 6.25-15 MG/5ML syrup    Sig: Take 5 mLs by mouth 4 (four) times daily as needed for cough.    Dispense:  118 mL    Refill:  0    Order Specific Question:   Supervising Provider    Answer:   Penni Homans A [[id number]]    I discussed the assessment and treatment plan with the patient. The patient was provided an opportunity to ask questions and all were answered. The patient agreed with the plan and demonstrated an understanding of the instructions.   The patient was advised to call back or seek an in-person evaluation if the symptoms worsen or if the condition fails to improve as anticipated.   I,Zite Okoli,acting as a Education administrator for [employer], NP.,have documented all relevant documentation on the behalf of Nance Pear, NP,as directed by  Nance Pear, NP while in the presence of Nance Pear,  NP.    Nance Pear, NP [employer] [facility] [employer] [phone] (phone) [phone] (fax)  [place]

## 2020-12-14 NOTE — Assessment & Plan Note (Addendum)
New. Will plan to treat with tylenol as needed for headache, fever. For congestion, continue mucinex add nasal saline rinses daily to be followed by her flonase 2 sprays each nostril.For cough, add promethazine DM PRN. Advised pt to take a home covid test and let us know if positive. Also, she is advised to call if she develops new/worsening symptoms or if symptoms are not improved in 3-4 days.  Pt verbalizes understanding.

## 2020-12-17 ENCOUNTER — Encounter: Payer: Self-pay | Admitting: Family Medicine

## 2020-12-17 ENCOUNTER — Telehealth: Payer: Self-pay | Admitting: Family Medicine

## 2020-12-17 DIAGNOSIS — J440 Chronic obstructive pulmonary disease with acute lower respiratory infection: Secondary | ICD-10-CM

## 2020-12-17 DIAGNOSIS — J209 Acute bronchitis, unspecified: Secondary | ICD-10-CM

## 2020-12-17 DIAGNOSIS — J40 Bronchitis, not specified as acute or chronic: Secondary | ICD-10-CM

## 2020-12-17 MED ORDER — DOXYCYCLINE HYCLATE 100 MG PO CAPS: 100.0000 mg | ORAL_CAPSULE | Freq: Two times a day (BID) | ORAL | 0 refills | Status: DC

## 2020-12-17 NOTE — Telephone Encounter (Signed)
Pt had virtual visits on 11/23/20 and 12/14/20. Please advise.

## 2020-12-17 NOTE — Telephone Encounter (Signed)
Pt wife called and stated cough is not letting up. Shes been coughing all weekend and pt can feel mucus in chest. She wants to know can she get an antibiotic that was discussed at her VV

## 2020-12-18 MED ORDER — FLUCONAZOLE 150 MG PO TABS: 150.0000 mg | ORAL_TABLET | Freq: Once | ORAL | 0 refills | Status: AC

## 2020-12-18 NOTE — Telephone Encounter (Signed)
Nurse Assessment Nurse: Mariea Clonts, RN, Brandi Date/Time (Eastern Time): 12/17/2020 6:42:31 PM Confirm and document reason for call. If symptomatic, describe symptoms. ---Caller states that she was supposed to have antibiotic called in. Spoke to Dr. Conley Canal Thursday or Friday, said if wasn't any better to call back. Called back today and spoke with office and they were to call in script. She has bronchitis, stuffy head, deep hacking cough. Symptoms started a week ago. Virtual visit last week and Dr. wrote script for cough medicine. No fever. Does the patient have any new or worsening symptoms? ---Yes Will a triage be completed? ---Yes Related visit to physician within the last 2 weeks? ---Yes Does the PT have any chronic conditions? (i.e. diabetes, asthma, this includes High risk factors for pregnancy, etc.) ---Yes List chronic conditions. ---htn Is this a behavioral health or substance abuse call? ---No Nurse: Mariea Clonts, RN, Brandi Date/Time (Eastern Time): 12/17/2020 6:44:51 PM Please select the assessment type ---RX called in but not at [facility] the name of the medication. ---[facility] Pharmacy name and phone number. ---[facility] [place] [phone] Has the office closed within the last 30 minutes? ---No PLEASE NOTE: All timestamps contained within this report are represented as [place] Standard Time. CONFIDENTIALTY NOTICE: This fax transmission is intended only for the addressee. It contains information that is legally privileged, confidential or otherwise protected from use or disclosure. If you are not the intended recipient, you are strictly prohibited from reviewing, disclosing, copying using or disseminating any of this information or taking any action in reliance on or regarding this information. If you have received this fax in error, please notify us immediately by telephone so that we can arrange for its return to [place]. Phone: [phone], Toll-Free: [phone], Fax:  [phone] Page: 2 of 3 Call Id: [id number] Nurse Assessment Does the client directives allow for assistance with medications after hours? ---Yes Is there an on-call physician for the client? ---Yes Additional Documentation ---allergic to sulfa, other meds include lisinopril, omeprazole, nortriptyline, topiramate (acetadopomide) Guidelines Guideline Title Affirmed Question Affirmed Notes Nurse Date/Time Eilene Ghazi Time) Cough - Acute Productive SEVERE coughing spells (e.g., whooping sound after coughing, vomiting after coughing) Mariea Clonts, RN, Brandi 12/17/2020 6:47:54 PM Disp. Time Eilene Ghazi Time) Disposition Final User 12/17/2020 6:53:35 PM Called On-Call Provider Mariea Clonts, RN, [place] 12/17/2020 6:49:43 PM See PCP within 24 Hours Yes Mariea Clonts, RN, Berdie Ogren Disagree/Comply Comply Caller Understands Yes PreDisposition Did not know what to do Care Advice Given Per Guideline SEE PCP WITHIN 24 HOURS: * IF OFFICE WILL BE OPEN: You need to be examined within the next 24 hours. Call your doctor (or NP/PA) when the office opens and make an appointment. COUGHING SPELLS: * Drink warm fluids. Inhale warm mist. This can help relax the airway and also loosen up phlegm. HUMIDIFIER: * Dry air makes coughs worse. CALL BACK IF: * You become worse Referrals REFERRED TO PCP OFFICE Paging DoctorName Phone DateTime Result/ Outcome Message Type Notes Thersa Salt- MD [phone] 12/17/2020 6:53:35 PM Called On Call Provider - Reached Doctor Paged PLEASE NOTE: All timestamps contained within this report are represented as [place] Standard Time. CONFIDENTIALTY NOTICE: This fax transmission is intended only for the addressee. It contains information that is legally privileged, confidential or otherwise protected from use or disclosure. If you are not the intended recipient, you are strictly prohibited from reviewing, disclosing, copying using or disseminating any of this information or taking  any action in reliance on or regarding this information. If you have received this fax in error,  please notify us immediately by telephone so that we can arrange for its return to [place]. Phone: [phone], Toll-Free: [phone], Fax: [phone] Page: 3 of 3 Call Id: [id number] Paging DoctorName Phone DateTime Result/ Outcome Message Type Notes Thersa Salt- MD 12/17/2020 6:55:39 PM Spoke with On Call - General Message Result Per Dr. Jerilee Hoh, pt should call back during regular business hours tomorrow. Spoke with pt to inform. Verbalized understanding and appreciation

## 2020-12-18 NOTE — Addendum Note (Signed)
Addended by: Lamar Blinks C on: 12/18/2020 12:37 PM   Modules accepted: Orders

## 2021-01-08 ENCOUNTER — Other Ambulatory Visit: Payer: Self-pay | Admitting: Family Medicine

## 2021-01-08 DIAGNOSIS — R0981 Nasal congestion: Secondary | ICD-10-CM | POA: Diagnosis not present

## 2021-01-08 DIAGNOSIS — R509 Fever, unspecified: Secondary | ICD-10-CM | POA: Diagnosis not present

## 2021-01-08 DIAGNOSIS — U071 COVID-19: Secondary | ICD-10-CM | POA: Diagnosis not present

## 2021-01-08 DIAGNOSIS — R059 Cough, unspecified: Secondary | ICD-10-CM | POA: Diagnosis not present

## 2021-01-08 DIAGNOSIS — I10 Essential (primary) hypertension: Secondary | ICD-10-CM

## 2021-02-02 ENCOUNTER — Other Ambulatory Visit: Payer: Self-pay | Admitting: Family Medicine

## 2021-02-02 DIAGNOSIS — I10 Essential (primary) hypertension: Secondary | ICD-10-CM

## 2021-02-02 DIAGNOSIS — K219 Gastro-esophageal reflux disease without esophagitis: Secondary | ICD-10-CM

## 2021-02-04 ENCOUNTER — Encounter: Payer: Self-pay | Admitting: Family Medicine

## 2021-02-04 ENCOUNTER — Telehealth: Payer: Self-pay | Admitting: Family Medicine

## 2021-02-04 NOTE — Telephone Encounter (Signed)
Medication:  lisinopril-hydrochlorothiazide (ZESTORETIC) 20-25 MG tablet [[id number]]  omeprazole (PRILOSEC) 20 MG capsule [[id number]] pravastatin (PRAVACHOL) 20 MG tablet [[id number]]   Has the patient contacted their pharmacy? No. (If no, request that the patient contact the pharmacy for the refill.) (If yes, when and what did the pharmacy advise?)     Preferred Pharmacy (with phone number or street name):  [facility], [place] - [address]  [place], [place] [address]  Phone:  [phone]  Fax:  [phone]    Agent: Please be advised that RX refills may take up to 3 business days. We ask that you follow-up with your pharmacy.   Pt has upcoming appt 02/11/21 @ 3:40 pm

## 2021-02-04 NOTE — Telephone Encounter (Signed)
I have called the pt to get scheduled for a follow up appt. She has not been seen in person for over a year and due for a follow up appt. Left message to call back.

## 2021-02-07 NOTE — Progress Notes (Addendum)
[place] [facility] [address] [place], [place] [address] [phone]  Date:  02/11/2021   Name:  Janice Lucas   DOB:  Jul 09, 1965   MRN:  [id number]  PCP:  Darreld Mclean, MD    Chief Complaint: Follow-up (Concerns/ questions: none/Flu shot today: yes/Pap: about 1 year ago with [facility]/)   History of Present Illness:  Janice Lucas is a 56 y.o. very pleasant female patient who presents with the following:  Patient is seen today for follow-up Most recent visit with myself November 2021 for a sick visit Pt with history of HTN, migraine HA, hyperlipidemia, intracranial hypertension/ pseudotumor cerebri dx in 2015, pre-diabetes   She did have a video visit with Dr. Delice Lesch in September: This is a pleasant 56 yo RH woman with a history of migraines and pseudotumor cerebri diagnosed in 2015. Prior attempts to wean Diamox caused increase in headaches. Headaches stable on Diamox 1000mg  BID, Topiramate 100mg  BID, and nortriptyline 100mg  qhs, refills sent. Continue follow-up with eye doctor. Follow-up in 1 year, call for any changes.   Colon cancer screening- no family history - she would like to do a colonoscopy  Can update Pap smear- per pt this is UTD  Mammogram- will order for her  Flu shot- give today  Has declined COVID-vaccine Most recent labs 11-20 21- pt has not eaten since early am   Diamox Albuterol as needed Aspirin 81 Lisinopril/HCTZ Nortriptyline Pravastatin Topamax  Her father died last year which was really hard- he lived right next door to her  Her mother is still living   Wt Readings from Last 3 Encounters:  02/11/21 (!) 304 lb 6.4 oz (138.1 kg)  10/01/20 285 lb (129.3 kg)  01/03/20 286 lb 12.8 oz (130.1 kg)    Patient Active Problem List   Diagnosis Date Noted   Allergic rhinitis 05/15/2020   Hypertension 07/05/2018   Migraine without aura and without status migrainosus, not intractable  02/08/2016   Depression 10/10/2015   Injury by electrocution 10/10/2015   Mixed hyperlipidemia 10/10/2015   Prediabetes 10/10/2015   Urinary incontinence 10/10/2015   Sleep apnea 08/30/2015   IIH (idiopathic intracranial hypertension) 11/14/2013   Herniated nucleus pulposus, C6-7 right 02/14/2011    Past Medical History:  Diagnosis Date   Hypertension    Sleep apnea     Past Surgical History:  Procedure Laterality Date   ANTERIOR CERVICAL DECOMP/DISCECTOMY FUSION  02/14/2011   Procedure: ANTERIOR CERVICAL DECOMPRESSION/DISCECTOMY FUSION 1 LEVEL;  Surgeon: Earleen Newport, MD;  Location: [facility];  Service: Neurosurgery;  Laterality: N/A;  Anterior Cervical Six-Seven Decompression and Fusion    Social History   Tobacco Use   Smoking status: Former    Packs/day: 0.50    Types: Cigarettes   Smokeless tobacco: Never  Vaping Use   Vaping Use: Every day   Substances: Nicotine, Flavoring  Substance Use Topics   Alcohol use: No    Comment: occassionally   Drug use: Not Currently    Family History  Problem Relation Age of Onset   Diabetes Maternal Aunt    Diabetes Maternal Uncle     Allergies  Allergen Reactions   Sulfa Antibiotics Other (See Comments)    Acts "goofy"   Neosporin  [Neomycin-Bacitracin Zn-Polymyx] Rash    Medication list has been reviewed and updated.  Current Outpatient Medications on File Prior to Visit  Medication Sig Dispense Refill   acetaminophen (TYLENOL)  500 MG tablet Take 500 mg by mouth every 6 (six) hours as needed.     acetaZOLAMIDE ER (DIAMOX) 500 MG capsule Take 2 capsules (1,000 mg total) by mouth 2 (two) times daily. 360 capsule 3   albuterol (VENTOLIN HFA) 108 (90 Base) MCG/ACT inhaler Inhale 2 puffs into the lungs every 6 (six) hours as needed for wheezing or shortness of breath. 1 each 0   Ascorbic Acid (VITAMIN C) 1000 MG tablet Take 1,000 mg by mouth daily.     aspirin EC 81 MG tablet Take 81 mg by mouth daily.     azelastine  (ASTELIN) 0.1 % nasal spray Place 2 sprays into both nostrils 2 (two) times daily. 30 mL 1   cholecalciferol (VITAMIN D3) 25 MCG (1000 UT) tablet Take 1,000 Units by mouth daily.     dextromethorphan-guaiFENesin (MUCINEX DM) 30-600 MG 12hr tablet Take 1 tablet by mouth 2 (two) times daily. 20 tablet 0   diclofenac Sodium (VOLTAREN) 1 % GEL Apply 4 g topically 4 (four) times daily as needed. 500 g 6   doxycycline (VIBRAMYCIN) 100 MG capsule Take 1 capsule (100 mg total) by mouth 2 (two) times daily. 20 capsule 0   fluticasone (FLONASE) 50 MCG/ACT nasal spray Place 2 sprays into both nostrils daily. 16 g 6   ketoconazole (NIZORAL) 2 % cream APPLY TOPICALLY TWICE DAILY (TO FEET AND TOE SPACES)     lisinopril-hydrochlorothiazide (ZESTORETIC) 20-25 MG tablet TAKE 1 TABLET BY MOUTH EVERY DAY 30 tablet 0   nortriptyline (PAMELOR) 50 MG capsule TAKE 2 CAPSULES BY MOUTH EVERY NIGHT AS DIRECTED 180 capsule 3   omeprazole (PRILOSEC) 20 MG capsule TAKE 1 CAPSULE BY MOUTH DAILY 30 capsule 0   pravastatin (PRAVACHOL) 20 MG tablet TAKE 1 TABLET BY MOUTH EVERY DAY FOR CHOLESTEROL 90 tablet 0   promethazine-dextromethorphan (PROMETHAZINE-DM) 6.25-15 MG/5ML syrup Take 5 mLs by mouth 4 (four) times daily as needed for cough. 118 mL 0   topiramate (TOPAMAX) 100 MG tablet Take 1 tablet (100 mg total) by mouth 2 (two) times daily. 180 tablet 3   vitamin E 1000 UNIT capsule Take 1,000 Units by mouth daily.     No current facility-administered medications on file prior to visit.    Review of Systems:  As per HPI- otherwise negative.   Physical Examination: Vitals:   02/11/21 1545  BP: 110/80  Pulse: 64  Resp: 18  Temp: 98.3 F (36.8 C)  SpO2: 100%   Vitals:   02/11/21 1545  Weight: (!) 304 lb 6.4 oz (138.1 kg)  Height: 5\' 7"  (1.702 m)   Body mass index is 47.68 kg/m. Ideal Body Weight: Weight in (lb) to have BMI = 25: 159.3  GEN: no acute distress. HEENT: Atraumatic, Normocephalic.  Ears and  Nose: No external deformity. CV: RRR, No M/G/R. No JVD. No thrill. No extra heart sounds. PULM: CTA B, no wheezes, crackles, rhonchi. No retractions. No resp. distress. No accessory muscle use. ABD: S, NT, ND, +BS. No rebound. No HSM. EXTR: No c/c/e PSYCH: Normally interactive. Conversant.    Assessment and Plan: Dyslipidemia - Plan: Lipid panel, pravastatin (PRAVACHOL) 20 MG tablet  Prediabetes - Plan: Comprehensive metabolic panel, Hemoglobin A1c  Essential hypertension - Plan: Comprehensive metabolic panel, lisinopril-hydrochlorothiazide (ZESTORETIC) 20-25 MG tablet  Screening for deficiency anemia - Plan: CBC  Fatigue, unspecified type - Plan: TSH, VITAMIN D 25 Hydroxy (Vit-D Deficiency, Fractures)  Need for influenza vaccination - Plan: Flu Vaccine QUAD 6+ mos PF IM (Fluarix  Quad PF)  Gastroesophageal reflux disease - Plan: omeprazole (PRILOSEC) 20 MG capsule  Screening for colon cancer - Plan: Ambulatory referral to Gastroenterology  Encounter for screening mammogram for malignant neoplasm of breast - Plan: MM 3D SCREEN BREAST BILATERAL  Allergic rhinitis, unspecified seasonality, unspecified trigger - Plan: fluticasone (FLONASE) 50 MCG/ACT nasal spray  Class 3 severe obesity due to excess calories with serious comorbidity and body mass index (BMI) of 45.0 to 49.9 in adult [facility])  Patient seen today for follow-up.  Blood pressure under control, continue current medication Ordered mammogram and referral to GI Will plan further follow- up pending labs.  Pt would like to use a GLP-1 potentially for weight loss and blood sugar No contraindication -no history of pancreatitis, no personal or family history of thyroid cancer or MEN syndrome Await A1c, patient may be in diabetic range now which could make drug coverage easier Signed Lamar Blinks, MD  Addendum 1/31, received labs as below.  Message to patient  Results for orders placed or performed in visit on 02/11/21  CBC   Result Value Ref Range   WBC 9.1 4.0 - 10.5 K/uL   RBC 4.42 3.87 - 5.11 Mil/uL   Platelets 292.0 150.0 - 400.0 K/uL   Hemoglobin 12.5 12.0 - 15.0 g/dL   HCT 38.7 36.0 - 46.0 %   MCV 87.5 78.0 - 100.0 fl   MCHC 32.3 30.0 - 36.0 g/dL   RDW 14.9 11.5 - 15.5 %  Comprehensive metabolic panel  Result Value Ref Range   Sodium 139 135 - 145 mEq/L   Potassium 3.4 (L) 3.5 - 5.1 mEq/L   Chloride 105 96 - 112 mEq/L   CO2 26 19 - 32 mEq/L   Glucose, Bld 87 70 - 99 mg/dL   BUN 20 6 - 23 mg/dL   Creatinine, Ser 0.85 0.40 - 1.20 mg/dL   Total Bilirubin 0.3 0.2 - 1.2 mg/dL   Alkaline Phosphatase 66 39 - 117 U/L   AST 18 0 - 37 U/L   ALT 16 0 - 35 U/L   Total Protein 7.5 6.0 - 8.3 g/dL   Albumin 4.1 3.5 - 5.2 g/dL   GFR 77.20 >60.00 mL/min   Calcium 8.9 8.4 - 10.5 mg/dL  Hemoglobin A1c  Result Value Ref Range   Hgb A1c MFr Bld 6.5 4.6 - 6.5 %  Lipid panel  Result Value Ref Range   Cholesterol 186 0 - 200 mg/dL   Triglycerides 114.0 0.0 - 149.0 mg/dL   HDL 48.90 >39.00 mg/dL   VLDL 22.8 0.0 - 40.0 mg/dL   LDL Cholesterol 114 (H) 0 - 99 mg/dL   Total CHOL/HDL Ratio 4    NonHDL 137.26   TSH  Result Value Ref Range   TSH 1.29 0.35 - 5.50 uIU/mL  VITAMIN D 25 Hydroxy (Vit-D Deficiency, Fractures)  Result Value Ref Range   VITD 35.93 30.00 - 100.00 ng/mL

## 2021-02-07 NOTE — Patient Instructions (Addendum)
It was good to see you again today I will be in touch with your lab work  Flu shot given today I do still recommend COVID-19 vaccination!   Please stop by imaging on the ground floor and schedule your mammogram for a convenient time  Gastroenterology should call you to set up your colonoscopy, but also feel free to give them a call  We will get your labs back we can look further at a weight loss medication such as Mounjaro for you

## 2021-02-11 ENCOUNTER — Encounter: Payer: Self-pay | Admitting: Family Medicine

## 2021-02-11 ENCOUNTER — Ambulatory Visit: Payer: BC Managed Care – PPO | Admitting: Family Medicine

## 2021-02-11 VITALS — BP 110/80 | HR 64 | Temp 98.3°F | Resp 18 | Ht 67.0 in | Wt 304.4 lb

## 2021-02-11 DIAGNOSIS — Z23 Encounter for immunization: Secondary | ICD-10-CM | POA: Diagnosis not present

## 2021-02-11 DIAGNOSIS — Z6841 Body Mass Index (BMI) 40.0 and over, adult: Secondary | ICD-10-CM

## 2021-02-11 DIAGNOSIS — E785 Hyperlipidemia, unspecified: Secondary | ICD-10-CM | POA: Diagnosis not present

## 2021-02-11 DIAGNOSIS — R7303 Prediabetes: Secondary | ICD-10-CM | POA: Diagnosis not present

## 2021-02-11 DIAGNOSIS — J309 Allergic rhinitis, unspecified: Secondary | ICD-10-CM

## 2021-02-11 DIAGNOSIS — Z130 Encounter for screening for diseases of the blood and blood-forming organs and certain disorders involving the immune mechanism: Secondary | ICD-10-CM

## 2021-02-11 DIAGNOSIS — I10 Essential (primary) hypertension: Secondary | ICD-10-CM

## 2021-02-11 DIAGNOSIS — K219 Gastro-esophageal reflux disease without esophagitis: Secondary | ICD-10-CM

## 2021-02-11 DIAGNOSIS — R5383 Other fatigue: Secondary | ICD-10-CM | POA: Diagnosis not present

## 2021-02-11 DIAGNOSIS — Z1231 Encounter for screening mammogram for malignant neoplasm of breast: Secondary | ICD-10-CM

## 2021-02-11 DIAGNOSIS — Z1211 Encounter for screening for malignant neoplasm of colon: Secondary | ICD-10-CM

## 2021-02-11 DIAGNOSIS — E66813 Obesity, class 3: Secondary | ICD-10-CM

## 2021-02-11 MED ORDER — PRAVASTATIN SODIUM 20 MG PO TABS: ORAL_TABLET | ORAL | 3 refills | Status: DC

## 2021-02-11 MED ORDER — OMEPRAZOLE 20 MG PO CPDR
20.0000 mg | DELAYED_RELEASE_CAPSULE | Freq: Every day | ORAL | 3 refills | Status: DC
  Filled 2021-07-09: qty 90, 90d supply, fill #0

## 2021-02-11 MED ORDER — FLUTICASONE PROPIONATE 50 MCG/ACT NA SUSP: 2.0000 | Freq: Every day | NASAL | 6 refills | Status: DC

## 2021-02-11 MED ORDER — LISINOPRIL-HYDROCHLOROTHIAZIDE 20-25 MG PO TABS
1.0000 | ORAL_TABLET | Freq: Every day | ORAL | 3 refills | Status: DC
  Filled 2021-09-11: qty 90, 90d supply, fill #0
  Filled 2021-12-14: qty 90, 90d supply, fill #1

## 2021-02-12 ENCOUNTER — Encounter: Payer: Self-pay | Admitting: Family Medicine

## 2021-02-12 ENCOUNTER — Encounter (HOSPITAL_BASED_OUTPATIENT_CLINIC_OR_DEPARTMENT_OTHER): Payer: Self-pay

## 2021-02-12 ENCOUNTER — Ambulatory Visit (HOSPITAL_BASED_OUTPATIENT_CLINIC_OR_DEPARTMENT_OTHER)
Admission: RE | Admit: 2021-02-12 | Discharge: 2021-02-12 | Disposition: A | Discharge status: Home / Self Care | Payer: BC Managed Care – PPO | Source: Ambulatory Visit | Attending: Family Medicine | Admitting: Family Medicine

## 2021-02-12 ENCOUNTER — Other Ambulatory Visit: Payer: Self-pay

## 2021-02-12 DIAGNOSIS — Z1231 Encounter for screening mammogram for malignant neoplasm of breast: Secondary | ICD-10-CM | POA: Insufficient documentation

## 2021-02-12 DIAGNOSIS — E785 Hyperlipidemia, unspecified: Secondary | ICD-10-CM

## 2021-02-12 DIAGNOSIS — E118 Type 2 diabetes mellitus with unspecified complications: Secondary | ICD-10-CM

## 2021-02-12 LAB — COMPREHENSIVE METABOLIC PANEL
ALT: 16 U/L (ref 0–35)
AST: 18 U/L (ref 0–37)
Albumin: 4.1 g/dL (ref 3.5–5.2)
Alkaline Phosphatase: 66 U/L (ref 39–117)
BUN: 20 mg/dL (ref 6–23)
CO2: 26 mEq/L (ref 19–32)
Calcium: 8.9 mg/dL (ref 8.4–10.5)
Chloride: 105 mEq/L (ref 96–112)
Creatinine, Ser: 0.85 mg/dL (ref 0.40–1.20)
GFR: 77.2 mL/min (ref >60.00)
Glucose, Bld: 87 mg/dL (ref 70–99)
Potassium: 3.4 mEq/L — ABNORMAL LOW (ref 3.5–5.1)
Sodium: 139 mEq/L (ref 135–145)
Total Bilirubin: 0.3 mg/dL (ref 0.2–1.2)
Total Protein: 7.5 g/dL (ref 6.0–8.3)

## 2021-02-12 LAB — CBC
HCT: 38.7 % (ref 36.0–46.0)
Hemoglobin: 12.5 g/dL (ref 12.0–15.0)
MCHC: 32.3 g/dL (ref 30.0–36.0)
MCV: 87.5 fl (ref 78.0–100.0)
Platelets: 292 10*3/uL (ref 150.0–400.0)
RBC: 4.42 Mil/uL (ref 3.87–5.11)
RDW: 14.9 % (ref 11.5–15.5)
WBC: 9.1 10*3/uL (ref 4.0–10.5)

## 2021-02-12 LAB — LIPID PANEL
Cholesterol: 186 mg/dL (ref 0–200)
HDL: 48.9 mg/dL (ref >39.00)
LDL Cholesterol: 114 mg/dL — ABNORMAL HIGH (ref 0–99)
NonHDL: 137.26
Total CHOL/HDL Ratio: 4
Triglycerides: 114 mg/dL (ref 0.0–149.0)
VLDL: 22.8 mg/dL (ref 0.0–40.0)

## 2021-02-12 LAB — TSH: TSH: 1.29 u[IU]/mL (ref 0.35–5.50)

## 2021-02-12 LAB — VITAMIN D 25 HYDROXY (VIT D DEFICIENCY, FRACTURES): VITD: 35.93 ng/mL (ref 30.00–100.00)

## 2021-02-12 LAB — HEMOGLOBIN A1C: Hgb A1c MFr Bld: 6.5 % (ref 4.6–6.5)

## 2021-02-14 MED ORDER — ATORVASTATIN CALCIUM 20 MG PO TABS
20.0000 mg | ORAL_TABLET | Freq: Every day | ORAL | 3 refills | Status: DC
  Filled 2021-05-28: qty 90, 90d supply, fill #0
  Filled 2021-08-27: qty 90, 90d supply, fill #1
  Filled 2021-11-25: qty 90, 90d supply, fill #2

## 2021-02-14 NOTE — Addendum Note (Signed)
Addended by: Darreld Mclean on: 02/14/2021 06:29 AM   Modules accepted: Orders

## 2021-03-05 NOTE — Telephone Encounter (Signed)
Opened in error

## 2021-03-20 ENCOUNTER — Telehealth: Payer: Self-pay | Admitting: Gastroenterology

## 2021-03-20 ENCOUNTER — Ambulatory Visit (AMBULATORY_SURGERY_CENTER): Payer: BC Managed Care – PPO | Admitting: *Deleted

## 2021-03-20 ENCOUNTER — Other Ambulatory Visit: Payer: Self-pay

## 2021-03-20 VITALS — Ht 67.0 in | Wt 300.0 lb

## 2021-03-20 DIAGNOSIS — Z1211 Encounter for screening for malignant neoplasm of colon: Secondary | ICD-10-CM

## 2021-03-20 MED ORDER — NA SULFATE-K SULFATE-MG SULF 17.5-3.13-1.6 GM/177ML PO SOLN: 1.0000 | Freq: Once | ORAL | 0 refills | Status: AC

## 2021-03-20 NOTE — Telephone Encounter (Signed)
Patient called.  She had pre-visit this morning and has questions about the medication.  Please call back.  Thank you. ?

## 2021-03-20 NOTE — Progress Notes (Signed)
No egg or soy allergy known to patient  ?No issues known to pt with past sedation with any surgeries or procedures ?Patient denies ever being told they had issues or difficulty with intubation  ?No FH of Malignant Hyperthermia ?Pt is not on diet pills ?Pt is not on  home 02  ?Pt is not on blood thinners  ?Pt denies issues with constipation  ?No A fib or A flutter ? ?Pt is NOT  vaccinated  for Covid  ? ? NO PA's for preps discussed with pt In PV today  ?Discussed with pt there will be an out-of-pocket cost for prep and that varies from $0 to 70 +  dollars - pt verbalized understanding  ?Pt instructed to use the singlecare or good rx app for suprep coupon  ?Due to the COVID-19 pandemic we are asking patients to follow certain guidelines in PV and the [facility]   ?Pt aware of COVID protocols and [facility] guidelines  ? ?PV completed over the phone. Pt verified name, DOB, address and insurance during PV today.  ?Pt mailed instruction packet with copy of consent form to read and not return, and instructions.  ?Pt encouraged to call with questions or issues.  ?If pt has My chart, procedure instructions sent via My Chart  ? ?

## 2021-03-20 NOTE — Telephone Encounter (Signed)
Pt has piercing's in her ears, nipples, and private area-  do they have be removed informed no- can stay put  ?

## 2021-03-24 ENCOUNTER — Encounter: Payer: Self-pay | Admitting: Gastroenterology

## 2021-04-02 ENCOUNTER — Ambulatory Visit (INDEPENDENT_AMBULATORY_CARE_PROVIDER_SITE_OTHER): Payer: BC Managed Care – PPO | Admitting: Adult Health

## 2021-04-02 ENCOUNTER — Other Ambulatory Visit: Payer: Self-pay

## 2021-04-02 ENCOUNTER — Encounter: Payer: Self-pay | Admitting: Adult Health

## 2021-04-02 VITALS — BP 123/86 | HR 88 | Ht 67.0 in | Wt 297.0 lb

## 2021-04-02 DIAGNOSIS — F4321 Adjustment disorder with depressed mood: Secondary | ICD-10-CM

## 2021-04-02 MED ORDER — SERTRALINE HCL 50 MG PO TABS: 50.0000 mg | ORAL_TABLET | Freq: Every day | ORAL | 2 refills | Status: DC

## 2021-04-02 NOTE — Progress Notes (Signed)
[facility] MD/PA/NP Initial Note ? ?04/02/2021 3:10 PM ?Janice Lucas  ?MRN:  [id number] ? ?Chief Complaint:  ? ?HPI:  ? ?Janice Lucas is a 56 y/o seen today for initial psychiatric evaluation. ? ?Describes mood today as "not very good". Pleasant. Tearful at times. Mood symptoms - reports depression, anxiety, and irritability. Feels more irritable overall - "on the edge". Has been more "short and snappy" with people. Stating "some days are better than others". The onset of symptoms occurred following the recent loss of her father - passed away from prostate cancer in October. Typically describes herself as an easy going person - "going with the flow". 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 Has been less motivated to do things over the past months. Father passed away 20 years old - prostate cancer. Mother, 74 years old living doing alright. Parents married for nearly 71 years. Has one brother. Mother lives in the middle of she and brother. Willing to consider both therapy and medication as treatment options, but prefers to start with medication.  ?Energy levels lower. Active, does not have a regular exercise routine.  ?Enjoys some usual interests and activities[place] Married. Lives with wife of 15 years - 4 dogs. Wife has a 21 year old son. Spending time with family. ?Appetite adequate. Weight gain. ?Sleeps well most nights. Averages 8 hours. Using CPAP nightly. ?Focus and concentration stable. Completing tasks. Managing aspects of household. Working full time 40 to 50 hours a week - currently on a leave of absence. ?Denies SI or HI.  ?Denies AH or VH. ? ?Previous medication trials: Denies ? ?Visit Diagnosis:  ?  ICD-10-CM   ?1. Grief reaction  F43.21 sertraline (ZOLOFT) 50 MG tablet  ?  ? ? ?Past Psychiatric History: Denies psychiatric hospitalization.  ? ?Past Medical History:  ?Past Medical History:  ?Diagnosis Date  ? Allergy   ? SEASONAL - MILD  ? GERD (gastroesophageal reflux disease)   ?  ON OMEPRAZOLE  ? Hyperlipidemia   ? ON MEDS  ? Hypertension   ? ON MEDS  ? Sleep apnea   ? WEARS CPAP  ?  ?Past Surgical History:  ?Procedure Laterality Date  ? ANTERIOR CERVICAL DECOMP/DISCECTOMY FUSION  02/14/2011  ? Procedure: ANTERIOR CERVICAL DECOMPRESSION/DISCECTOMY FUSION 1 LEVEL;  Surgeon: Earleen Newport, MD;  Location: [facility];  Service: Neurosurgery;  Laterality: N/A;  Anterior Cervical Six-Seven Decompression and Fusion  ? ? ?Family Psychiatric History: Denies any family history of mental illness.  ? ?Family History:  ?Family History  ?Problem Relation Age of Onset  ? Prostate cancer Father   ?     METS TO LYMPH NODES AND BLADDER  ? Diabetes Maternal Aunt   ? Diabetes Maternal Uncle   ? Colon cancer Neg Hx   ? Colon polyps Neg Hx   ? Esophageal cancer Neg Hx   ? Rectal cancer Neg Hx   ? Stomach cancer Neg Hx   ? ? ?Social History:  ?Social History  ? ?Socioeconomic History  ? Marital status: Married  ?  Spouse name: Not on file  ? Number of children: Not on file  ? Years of education: Not on file  ? Highest education level: Not on file  ?Occupational History  ? Not on file  ?Tobacco Use  ? Smoking status: Former  ?  Packs/day: 0.50  ?  Types: Cigarettes  ? Smokeless tobacco: Current  ? Tobacco comments:  ?  VAPES   ?Vaping  Use  ? Vaping Use: Every day  ? Substances: Nicotine, Flavoring  ?Substance and Sexual Activity  ? Alcohol use: No  ?  Comment: occassionally  ? Drug use: Not Currently  ? Sexual activity: Not Currently  ?Other Topics Concern  ? Not on file  ?Social History Narrative  ? Right Handed  ? Lives in a one story home  ? Drinks a big cup of coffee daily  ? ?Social Determinants of Health  ? ?Financial Resource Strain: Not on file  ?Food Insecurity: Not on file  ?Transportation Needs: Not on file  ?Physical Activity: Not on file  ?Stress: Not on file  ?Social Connections: Not on file  ? ? ?Allergies:  ?Allergies  ?Allergen Reactions  ? Sulfa Antibiotics Other (See Comments)  ?  Acts "goofy"   ? Neosporin  [Neomycin-Bacitracin Zn-Polymyx] Rash  ? ? ?Metabolic Disorder Labs: ?Lab Results  ?Component Value Date  ? HGBA1C 6.5 02/11/2021  ? MPG 128 11/15/2019  ? ?No results found for: PROLACTIN ?Lab Results  ?Component Value Date  ? CHOL 186 02/11/2021  ? TRIG 114.0 02/11/2021  ? HDL 48.90 02/11/2021  ? CHOLHDL 4 02/11/2021  ? VLDL 22.8 02/11/2021  ? LDLCALC 114 (H) 02/11/2021  ? [place] 110 (H) 11/15/2019  ? ?Lab Results  ?Component Value Date  ? TSH 1.29 02/11/2021  ? TSH 1.87 07/13/2018  ? ? ?Therapeutic Level Labs: ?No results found for: LITHIUM ?No results found for: VALPROATE ?No components found for:  CBMZ ? ?Current Medications: ?Current Outpatient Medications  ?Medication Sig Dispense Refill  ? sertraline (ZOLOFT) 50 MG tablet Take 1 tablet (50 mg total) by mouth daily. 30 tablet 2  ? acetaminophen (TYLENOL) 500 MG tablet Take 500 mg by mouth every 6 (six) hours as needed.    ? acetaZOLAMIDE ER (DIAMOX) 500 MG capsule Take 2 capsules (1,000 mg total) by mouth 2 (two) times daily. 360 capsule 3  ? albuterol (VENTOLIN HFA) 108 (90 Base) MCG/ACT inhaler Inhale 2 puffs into the lungs every 6 (six) hours as needed for wheezing or shortness of breath. 1 each 0  ? Ascorbic Acid (VITAMIN C) 1000 MG tablet Take 1,000 mg by mouth daily.    ? aspirin EC 81 MG tablet Take 81 mg by mouth daily.    ? atorvastatin (LIPITOR) 20 MG tablet Take 1 tablet (20 mg total) by mouth daily. 90 tablet 3  ? azelastine (ASTELIN) 0.1 % nasal spray Place 2 sprays into both nostrils 2 (two) times daily. (Patient not taking: Reported on 03/20/2021) 30 mL 1  ? Calcium Carb-Cholecalciferol (CALCIUM 1000 + D PO) Take by mouth.    ? cholecalciferol (VITAMIN D3) 25 MCG (1000 UT) tablet Take 1,000 Units by mouth daily.    ? fluticasone (FLONASE) 50 MCG/ACT nasal spray Place 2 sprays into both nostrils daily. 16 g 6  ? lisinopril-hydrochlorothiazide (ZESTORETIC) 20-25 MG tablet Take 1 tablet by mouth daily. 90 tablet 3  ? MAGNESIUM PO Take  by mouth.    ? nortriptyline (PAMELOR) 50 MG capsule TAKE 2 CAPSULES BY MOUTH EVERY NIGHT AS DIRECTED 180 capsule 3  ? omeprazole (PRILOSEC) 20 MG capsule Take 1 capsule (20 mg total) by mouth daily. 90 capsule 3  ? topiramate (TOPAMAX) 100 MG tablet Take 1 tablet (100 mg total) by mouth 2 (two) times daily. 180 tablet 3  ? vitamin E 1000 UNIT capsule Take 1,000 Units by mouth daily.    ? ?No current facility-administered medications for this visit.  ? ? ?  Medication Side Effects: none ? ?Orders placed this visit:  No orders of the defined types were placed in this encounter. ? ? ?Psychiatric Specialty Exam: ? ?Review of Systems  ?Musculoskeletal:  Negative for gait problem.  ?Neurological:  Negative for tremors.  ?Psychiatric/Behavioral:    ?     Please refer to HPI   ?Last menstrual period 01/29/2011.There is no height or weight on file to calculate BMI.  ?General Appearance: Casual and Neat  ?Eye Contact:  Good  ?Speech:  Clear and Coherent and Normal Rate  ?Volume:  Normal  ?Mood:  Depressed  ?Affect:  Appropriate and Congruent  ?Thought Process:  Coherent and Descriptions of Associations: Intact  ?Orientation:  Full (Time, Place, and Person)  ?Thought Content: Logical   ?Suicidal Thoughts:  No  ?Homicidal Thoughts:  No  ?Memory:  WNL  ?Judgement:  Good  ?Insight:  Good  ?Psychomotor Activity:  Normal  ?Concentration:  Concentration: Good and Attention Span: Good  ?Recall:  Good  ?Fund of Knowledge: Good  ?Language: Good  ?Assets:  Communication Skills ?Desire for Improvement ?Financial Resources/Insurance ?Housing ?Intimacy ?Leisure Time ?Physical Health ?Resilience ?Social Support ?Talents/Skills ?Transportation ?Vocational/Educational  ?ADL's:  Intact  ?Cognition: WNL  ?Prognosis:  Good  ? ?Screenings:  ?PHQ2-9   ? ?[facility] Office Visit from 02/11/2021 in [facility] Visit from 01/03/2020 in [facility]  from 08/17/2014 in [facility]  ?PHQ-2 Total Score 0 0 0  ? ?  ? ? ?Receiving Psychotherapy: No  ? ?Treatment Plan/Recommendations:  ? ?Plan: ? ?PDMP reviewed ? ?1 - Add Zolo

## 2021-04-03 ENCOUNTER — Ambulatory Visit (AMBULATORY_SURGERY_CENTER): Payer: BC Managed Care – PPO | Admitting: Gastroenterology

## 2021-04-03 ENCOUNTER — Other Ambulatory Visit: Payer: Self-pay | Admitting: Gastroenterology

## 2021-04-03 ENCOUNTER — Encounter: Payer: Self-pay | Admitting: Gastroenterology

## 2021-04-03 VITALS — BP 110/74 | HR 71 | Resp 15 | Ht 67.0 in | Wt 304.0 lb

## 2021-04-03 DIAGNOSIS — C20 Malignant neoplasm of rectum: Secondary | ICD-10-CM

## 2021-04-03 DIAGNOSIS — Z1211 Encounter for screening for malignant neoplasm of colon: Secondary | ICD-10-CM | POA: Diagnosis not present

## 2021-04-03 DIAGNOSIS — D125 Benign neoplasm of sigmoid colon: Secondary | ICD-10-CM

## 2021-04-03 DIAGNOSIS — K6289 Other specified diseases of anus and rectum: Secondary | ICD-10-CM

## 2021-04-03 MED ORDER — SODIUM CHLORIDE 0.9 % IV SOLN: 500.0000 mL | Freq: Once | INTRAVENOUS | Status: DC

## 2021-04-03 NOTE — Progress Notes (Signed)
To pacu, VSS. Report to RN.tb 

## 2021-04-03 NOTE — Progress Notes (Signed)
Called to room to assist during endoscopic procedure.  Patient ID and intended procedure confirmed with present staff. Received instructions for my participation in the procedure from the performing physician.  

## 2021-04-03 NOTE — Patient Instructions (Signed)
Await pathology results. ? ?Handout on polyps, diverticulosis, and hemorrhoids given. ? ?YOU HAD AN ENDOSCOPIC PROCEDURE TODAY AT [facility]:   Refer to the procedure report that was given to you for any specific questions about what was found during the examination.  If the procedure report does not answer your questions, please call your gastroenterologist to clarify.  If you requested that your care partner not be given the details of your procedure findings, then the procedure report has been included in a sealed envelope for you to review at your convenience later. ? ?YOU SHOULD EXPECT: Some feelings of bloating in the abdomen. Passage of more gas than usual.  Walking can help get rid of the air that was put into your GI tract during the procedure and reduce the bloating. If you had a lower endoscopy (such as a colonoscopy or flexible sigmoidoscopy) you may notice spotting of blood in your stool or on the toilet paper. If you underwent a bowel prep for your procedure, you may not have a normal bowel movement for a few days. ? ?Please Note:  You might notice some irritation and congestion in your nose or some drainage.  This is from the oxygen used during your procedure.  There is no need for concern and it should clear up in a day or so. ? ?SYMPTOMS TO REPORT IMMEDIATELY: ? ?Following lower endoscopy (colonoscopy or flexible sigmoidoscopy): ? Excessive amounts of blood in the stool ? Significant tenderness or worsening of abdominal pains ? Swelling of the abdomen that is new, acute ? Fever of 100?F or higher ? ?For urgent or emergent issues, a gastroenterologist can be reached at any hour by calling [phone]. ?Do not use MyChart messaging for urgent concerns.  ? ? ?DIET:  We do recommend a small meal at first, but then you may proceed to your regular diet.  Drink plenty of fluids but you should avoid alcoholic beverages for 24 hours. ? ?ACTIVITY:  You should plan to take it easy for the  rest of today and you should NOT DRIVE or use heavy machinery until tomorrow (because of the sedation medicines used during the test).   ? ?FOLLOW UP: ?Our staff will call the number listed on your records 48-72 hours following your procedure to check on you and address any questions or concerns that you may have regarding the information given to you following your procedure. If we do not reach you, we will leave a message.  We will attempt to reach you two times.  During this call, we will ask if you have developed any symptoms of COVID 19. If you develop any symptoms (ie: fever, flu-like symptoms, shortness of breath, cough etc.) before then, please call [phone].  If you test positive for Covid 19 in the 2 weeks post procedure, please call and report this information to [place].   ? ?If any biopsies were taken you will be contacted by phone or by letter within the next 1-3 weeks.  Please call us at [phone] if you have not heard about the biopsies in 3 weeks.  ? ? ?SIGNATURES/CONFIDENTIALITY: ?You and/or your care partner have signed paperwork which will be entered into your electronic medical record.  These signatures attest to the fact that that the information above on your After Visit Summary has been reviewed and is understood.  Full responsibility of the confidentiality of this discharge information lies with you and/or your care-partner.  ?

## 2021-04-03 NOTE — Op Note (Signed)
[facility] ?Patient Name: Janice Lucas ?Procedure Date: 04/03/2021 9:27 AM ?MRN: [id number] ?Endoscopist: Jackquline Denmark , MD ?Age: 56 ?Referring MD:  ?Date of Birth: 21-Aug-1965 ?Gender: Female ?Account #: [id number] ?Procedure:                Colonoscopy ?Indications:              Screening for colorectal malignant neoplasm ?Medicines:                Monitored Anesthesia Care ?Procedure:                Pre-Anesthesia Assessment: ?                          - Prior to the procedure, a History and Physical  ?                          was performed, and patient medications and  ?                          allergies were reviewed. The patient's tolerance of  ?                          previous anesthesia was also reviewed. The risks  ?                          and benefits of the procedure and the sedation  ?                          options and risks were discussed with the patient.  ?                          All questions were answered, and informed consent  ?                          was obtained. Prior Anticoagulants: The patient has  ?                          taken no previous anticoagulant or antiplatelet  ?                          agents. ASA Grade Assessment: III - A patient with  ?                          severe systemic disease. After reviewing the risks  ?                          and benefits, the patient was deemed in  ?                          satisfactory condition to undergo the procedure. ?                          After obtaining informed consent, the colonoscope  ?  was passed under direct vision. Throughout the  ?                          procedure, the patient's blood pressure, pulse, and  ?                          oxygen saturations were monitored continuously. The  ?                          CF [id number] #[id number] was introduced through the anus  ?                          and advanced to the the cecum, identified by  ?                          appendiceal  orifice and ileocecal valve. The  ?                          colonoscopy was performed without difficulty. The  ?                          patient tolerated the procedure well. The quality  ?                          of the bowel preparation was good. The ileocecal  ?                          valve, appendiceal orifice, and rectum were  ?                          photographed. ?Scope In: 9:37:35 AM ?Scope Out: 9:59:55 AM ?Scope Withdrawal Time: 0 hours 17 minutes 3 seconds  ?Total Procedure Duration: 0 hours 22 minutes 20 seconds  ?Findings:                 A small sessile ulcerated non-obstructing small  ?                          (1.2 cm x 1 cm) mass was found in the mid rectum, 6  ?                          cm from the dentate line. The mass was  ?                          non-circumferential. Felt on deep rectal  ?                          examination to be in the right posterior rectum at  ?                          first valve of [place]. This was biopsied with a  ?                          cold forceps  for histology. ?                          A 2 mm polyp was found in the distal sigmoid colon.  ?                          The polyp was sessile. The polyp was removed with a  ?                          cold snare. Resection was complete. Benign polyp-  ?                          not sent for histology d/t likely contamination  ?                          from rectal biopsies in trap. ?                          A few medium-mouthed diverticula were found in the  ?                          sigmoid colon. ?                          Non-bleeding internal hemorrhoids were found during  ?                          retroflexion. The hemorrhoids were small and Grade  ?                          I (internal hemorrhoids that do not prolapse). ?                          There was a medium-sized lipoma, 10 mm in diameter,  ?                          in the proximal ascending colon and in the mid  ?                           ascending colon. ?Complications:            No immediate complications. ?Estimated Blood Loss:     Estimated blood loss: none. ?Impression:               - Small rectal mass. Biopsied. ?                          - One 2 mm polyp in the distal sigmoid colon,  ?                          removed with a cold snare. ?                          - Mild sigmoid diverticulosis. ?                          -  Non-bleeding internal hemorrhoids. ?                          - Medium-sized lipoma in the proximal ascending  ?                          colon and in the mid ascending colon. ?Recommendation:           - Patient has a contact number available for  ?                          emergencies. The signs and symptoms of potential  ?                          delayed complications were discussed with the  ?                          patient. Return to normal activities tomorrow.  ?                          Written discharge instructions were provided to the  ?                          patient. ?                          - Resume previous diet. ?                          - Continue present medications. ?                          - Await pathology results. Bx sent rush. ?                          - The findings and recommendations were discussed  ?                          with the patient's family. ?Jackquline Denmark, MD ?04/03/2021 10:14:37 AM ?This report has been signed electronically. ?

## 2021-04-03 NOTE — Progress Notes (Signed)
Pt's states no medical or surgical changes since previsit or office visit. 

## 2021-04-03 NOTE — Progress Notes (Signed)
[facility] History and Physical ? ? ?Primary Care Physician:  Darreld Mclean, MD ? ? ?Reason for Procedure:   Colorectal cancer screening ? ?Plan:     colonoscopy ? ? ? ? ?HPI: Janice Lucas is a 56 y.o. female  ? ? ?Past Medical History:  ?Diagnosis Date  ? Allergy   ? SEASONAL - MILD  ? GERD (gastroesophageal reflux disease)   ? ON OMEPRAZOLE  ? Hyperlipidemia   ? ON MEDS  ? Hypertension   ? ON MEDS  ? Sleep apnea   ? WEARS CPAP  ? ? ?Past Surgical History:  ?Procedure Laterality Date  ? ANTERIOR CERVICAL DECOMP/DISCECTOMY FUSION  02/14/2011  ? Procedure: ANTERIOR CERVICAL DECOMPRESSION/DISCECTOMY FUSION 1 LEVEL;  Surgeon: Earleen Newport, MD;  Location: [place] [facility] OR[facility];  Service: Neurosurgery;  Laterality: N/A;  Anterior Cervical Six-Seven Decompression and Fusion  ? ? ?Prior to Admission medications   ?Medication Sig Start Date End Date Taking? Authorizing Provider  ?acetaminophen (TYLENOL) 500 MG tablet Take 500 mg by mouth every 6 (six) hours as needed.   Yes [provider]  ?acetaZOLAMIDE ER (DIAMOX) 500 MG capsule Take 2 capsules (1,000 mg total) by mouth 2 (two) times daily. 10/01/20  Yes Cameron Sprang, MD  ?Ascorbic Acid (VITAMIN C) 1000 MG tablet Take 1,000 mg by mouth daily.   Yes [provider]  ?aspirin EC 81 MG tablet Take 81 mg by mouth daily.   Yes [provider]  ?atorvastatin (LIPITOR) 20 MG tablet Take 1 tablet (20 mg total) by mouth daily. 02/14/21  Yes Copland, Gay Filler, MD  ?Calcium Carb-Cholecalciferol (CALCIUM 1000 + D PO) Take by mouth.   Yes [provider]  ?cholecalciferol (VITAMIN D3) 25 MCG (1000 UT) tablet Take 1,000 Units by mouth daily.   Yes [provider]  ?lisinopril-hydrochlorothiazide (ZESTORETIC) 20-25 MG tablet Take 1 tablet by mouth daily. 02/11/21  Yes Copland, Gay Filler, MD  ?MAGNESIUM PO Take by mouth.   Yes [provider]  ?nortriptyline (PAMELOR) 50 MG capsule TAKE 2 CAPSULES BY MOUTH EVERY NIGHT AS  DIRECTED 10/01/20  Yes Cameron Sprang, MD  ?omeprazole (PRILOSEC) 20 MG capsule Take 1 capsule (20 mg total) by mouth daily. 02/11/21  Yes Copland, Gay Filler, MD  ?sertraline (ZOLOFT) 50 MG tablet Take 1 tablet (50 mg total) by mouth daily. 04/02/21  Yes Mozingo, Berdie Ogren, NP  ?topiramate (TOPAMAX) 100 MG tablet Take 1 tablet (100 mg total) by mouth 2 (two) times daily. 10/01/20  Yes Cameron Sprang, MD  ?vitamin E 1000 UNIT capsule Take 1,000 Units by mouth daily.   Yes [provider]  ?albuterol (VENTOLIN HFA) 108 (90 Base) MCG/ACT inhaler Inhale 2 puffs into the lungs every 6 (six) hours as needed for wheezing or shortness of breath. 11/30/19   Copland, Gay Filler, MD  ?azelastine (ASTELIN) 0.1 % nasal spray Place 2 sprays into both nostrils 2 (two) times daily. ?Patient not taking: Reported on 03/20/2021 03/28/20   Colon Branch, MD  ?fluticasone [facility]) 50 MCG/ACT nasal spray Place 2 sprays into both nostrils daily. 02/11/21   Copland, Gay Filler, MD  ? ? ?Current Outpatient Medications  ?Medication Sig Dispense Refill  ? acetaminophen (TYLENOL) 500 MG tablet Take 500 mg by mouth every 6 (six) hours as needed.    ? acetaZOLAMIDE ER (DIAMOX) 500 MG capsule Take 2 capsules (1,000 mg total) by mouth 2 (two) times daily. 360 capsule 3  ? Ascorbic Acid (VITAMIN  C) 1000 MG tablet Take 1,000 mg by mouth daily.    ? aspirin EC 81 MG tablet Take 81 mg by mouth daily.    ? atorvastatin (LIPITOR) 20 MG tablet Take 1 tablet (20 mg total) by mouth daily. 90 tablet 3  ? Calcium Carb-Cholecalciferol (CALCIUM 1000 + D PO) Take by mouth.    ? cholecalciferol (VITAMIN D3) 25 MCG (1000 UT) tablet Take 1,000 Units by mouth daily.    ? lisinopril-hydrochlorothiazide (ZESTORETIC) 20-25 MG tablet Take 1 tablet by mouth daily. 90 tablet 3  ? MAGNESIUM PO Take by mouth.    ? nortriptyline (PAMELOR) 50 MG capsule TAKE 2 CAPSULES BY MOUTH EVERY NIGHT AS DIRECTED 180 capsule 3  ? omeprazole (PRILOSEC) 20 MG capsule Take 1 capsule  (20 mg total) by mouth daily. 90 capsule 3  ? sertraline (ZOLOFT) 50 MG tablet Take 1 tablet (50 mg total) by mouth daily. 30 tablet 2  ? topiramate (TOPAMAX) 100 MG tablet Take 1 tablet (100 mg total) by mouth 2 (two) times daily. 180 tablet 3  ? vitamin E 1000 UNIT capsule Take 1,000 Units by mouth daily.    ? albuterol (VENTOLIN HFA) 108 (90 Base) MCG/ACT inhaler Inhale 2 puffs into the lungs every 6 (six) hours as needed for wheezing or shortness of breath. 1 each 0  ? azelastine (ASTELIN) 0.1 % nasal spray Place 2 sprays into both nostrils 2 (two) times daily. (Patient not taking: Reported on 03/20/2021) 30 mL 1  ? fluticasone (FLONASE) 50 MCG/ACT nasal spray Place 2 sprays into both nostrils daily. 16 g 6  ? ?Current Facility-Administered Medications  ?Medication Dose Route Frequency Provider Last Rate Last Admin  ? 0.9 %  sodium chloride infusion  500 mL Intravenous Once Jackquline Denmark, MD      ? ? ?Allergies as of 04/03/2021 - Review Complete 04/03/2021  ?Allergen Reaction Noted  ? Sulfa antibiotics Other (See Comments) 02/14/2011  ? Neosporin  [neomycin-bacitracin zn-polymyx] Rash 11/11/2013  ? ? ?Family History  ?Problem Relation Age of Onset  ? Prostate cancer Father   ?     METS TO LYMPH NODES AND BLADDER  ? Diabetes Maternal Aunt   ? Diabetes Maternal Uncle   ? Colon cancer Neg Hx   ? Colon polyps Neg Hx   ? Esophageal cancer Neg Hx   ? Rectal cancer Neg Hx   ? Stomach cancer Neg Hx   ? ? ?Social History  ? ?Socioeconomic History  ? Marital status: Married  ?  Spouse name: Not on file  ? Number of children: Not on file  ? Years of education: Not on file  ? Highest education level: Not on file  ?Occupational History  ? Not on file  ?Tobacco Use  ? Smoking status: Former  ?  Packs/day: 0.50  ?  Types: Cigarettes  ? Smokeless tobacco: Current  ? Tobacco comments:  ?  VAPES   ?Vaping Use  ? Vaping Use: Every day  ? Substances: Nicotine, Flavoring  ?Substance and Sexual Activity  ? Alcohol use: No  ?  Comment:  occassionally  ? Drug use: Not Currently  ? Sexual activity: Not Currently  ?Other Topics Concern  ? Not on file  ?Social History Narrative  ? Right Handed  ? Lives in a one story home  ? Drinks a big cup of coffee daily  ? ?Social Determinants of Health  ? ?Financial Resource Strain: Not on file  ?Food Insecurity: Not on file  ?Transportation  Needs: Not on file  ?Physical Activity: Not on file  ?Stress: Not on file  ?Social Connections: Not on file  ?Intimate Partner Violence: Not on file  ? ? ?Review of Systems: ?Positive for none ?All other review of systems negative except as mentioned in the HPI. ? ?Physical Exam: ?Vital signs in last 24 hours: ?'@VSRANGES'$ @ ?  ?General:   Alert,  Well-developed, well-nourished, pleasant and cooperative in NAD ?Lungs:  Clear throughout to auscultation.   ?Heart:  Regular rate and rhythm; no murmurs, clicks, rubs,  or gallops. ?Abdomen:  Soft, nontender and nondistended. Normal bowel sounds.   ?Neuro/Psych:  Alert and cooperative. Normal mood and affect. A and O x 3 ? ? ? ?No significant changes were identified.  The patient continues to be an appropriate candidate for the planned procedure and anesthesia. ? ? ?Carmell Austria, MD. ?[facility] ?04/03/2021 9:27 AM@ ? ?

## 2021-04-04 ENCOUNTER — Encounter: Payer: Self-pay | Admitting: Gastroenterology

## 2021-04-05 ENCOUNTER — Telehealth: Payer: Self-pay | Admitting: *Deleted

## 2021-04-05 ENCOUNTER — Telehealth: Payer: Self-pay | Admitting: Gastroenterology

## 2021-04-05 ENCOUNTER — Telehealth: Payer: Self-pay

## 2021-04-05 ENCOUNTER — Encounter: Payer: Self-pay | Admitting: Gastroenterology

## 2021-04-05 NOTE — Telephone Encounter (Signed)
Pt spouse Janice Lucas calling in for pt in regard to path results. They stated that they received the results via My chart and are very upset and wanting to know exactly what the results mean and any recommendations: Aimiee stated that Janice Lucas recently lost her father to Cancer: ?Please advise  ?

## 2021-04-05 NOTE — Telephone Encounter (Signed)
Patient spouse (Aimee) called discuss lab results. She can be contacted at [phone] ?

## 2021-04-05 NOTE — Telephone Encounter (Signed)
?  Follow up Call- ? ? ?  04/03/2021  ?  9:00 AM  ?Call back number  ?Post procedure Call Back phone  # [phone]  ?Permission to leave phone message Yes  ?  ? ?Patient questions: ? ?Do you have a fever, pain , or abdominal swelling? No. ?Pain Score  0 * ? ?Have you tolerated food without any problems? Yes.   ? ?Have you been able to return to your normal activities? Yes.   ? ?Do you have any questions about your discharge instructions: ?Diet   No. ?Medications  No. ?Follow up visit  No. ? ?Do you have questions or concerns about your Care? No. ? ?Actions: ?* If pain score is 4 or above: ?No action needed, pain <4. ? ? ?

## 2021-04-07 NOTE — Progress Notes (Signed)
I have discussed with the patient over the weekend.  She would like to seek Sx care at [facility]. ?I also discussed with pathology ? ?She has a small rectal adenocarcinoma ? ?Plan: ?-Check CBC, CMP, CEA ?-Check CT AP, chest with contrast ?-Referred to Dr. Lenise Arena ([facility]). ?Candidate for transanal resection. ?-Also oncology coordinator for oncology appointment. ?-I decided to hold off on MRI until she sees Dr. Morton Stall ? ?Send report to family physician

## 2021-04-08 ENCOUNTER — Other Ambulatory Visit: Payer: Self-pay

## 2021-04-08 ENCOUNTER — Encounter: Payer: Self-pay | Admitting: *Deleted

## 2021-04-08 DIAGNOSIS — C20 Malignant neoplasm of rectum: Secondary | ICD-10-CM

## 2021-04-08 NOTE — Progress Notes (Signed)
Reached out to Janice Lucas to introduce myself as the office RN Navigator and explain our new patient process. Reviewed the reason for their referral and scheduled their new patient appointment along with labs. Provided address and directions to the office including call back phone number. Reviewed with patient any concerns they may have or any possible barriers to attending their appointment.  ? ?Informed patient about my role as a navigator and that I will meet with them prior to their New Patient appointment and more fully discuss what services I can provide. At this time patient has no further questions or needs.   ? ?Oncology Nurse Navigator Documentation ? ? ?  04/08/2021  ? 11:15 AM  ?Oncology Nurse Navigator Flowsheets  ?Abnormal Finding Date 04/03/2021  ?Confirmed Diagnosis Date 04/03/2021  ?Diagnosis Status Additional Work Up  ?Navigator Follow Up Date: 04/09/2021  ?Navigator Follow Up Reason: New Patient Appointment  ?Navigator Location [facility]  ?Referral Date to RadOnc/MedOnc 04/08/2021  ?Navigator Encounter Type Introductory Phone Call  ?Patient Visit Type MedOnc  ?Treatment Phase Pre-Tx/Tx Discussion  ?Barriers/Navigation Needs Coordination of Care;Education  ?Education Other  ?Interventions Coordination of Care;Education  ?Acuity Level 2-Minimal Needs (1-2 Barriers Identified)  ?Coordination of Care Appts  ?Education Method Verbal  ?Support Groups/Services Friends and Family  ?Time Spent with Patient 30  ?  ?

## 2021-04-09 ENCOUNTER — Other Ambulatory Visit: Payer: Self-pay

## 2021-04-09 ENCOUNTER — Encounter: Payer: Self-pay | Admitting: *Deleted

## 2021-04-09 ENCOUNTER — Encounter: Payer: Self-pay | Admitting: Hematology & Oncology

## 2021-04-09 ENCOUNTER — Inpatient Hospital Stay: Payer: BC Managed Care – PPO | Attending: Hematology & Oncology

## 2021-04-09 ENCOUNTER — Inpatient Hospital Stay: Payer: BC Managed Care – PPO | Admitting: Hematology & Oncology

## 2021-04-09 VITALS — BP 110/73 | HR 95 | Temp 97.7°F | Resp 18 | Ht 67.0 in | Wt 296.0 lb

## 2021-04-09 DIAGNOSIS — C20 Malignant neoplasm of rectum: Secondary | ICD-10-CM | POA: Diagnosis not present

## 2021-04-09 DIAGNOSIS — Z87891 Personal history of nicotine dependence: Secondary | ICD-10-CM | POA: Diagnosis not present

## 2021-04-09 LAB — IRON AND IRON BINDING CAPACITY (CC-WL,HP ONLY)
Iron: 68 ug/dL (ref 28–170)
Saturation Ratios: 22 % (ref 10.4–31.8)
TIBC: 307 ug/dL (ref 250–450)
UIBC: 239 ug/dL (ref 148–442)

## 2021-04-09 LAB — CMP (CANCER CENTER ONLY)
ALT: 15 U/L (ref 0–44)
AST: 15 U/L (ref 15–41)
Albumin: 4.3 g/dL (ref 3.5–5.0)
Alkaline Phosphatase: 70 U/L (ref 38–126)
Anion gap: 12 (ref 5–15)
BUN: 19 mg/dL (ref 6–20)
CO2: 22 mmol/L (ref 22–32)
Calcium: 9 mg/dL (ref 8.9–10.3)
Chloride: 104 mmol/L (ref 98–111)
Creatinine: 0.85 mg/dL (ref 0.44–1.00)
GFR, Estimated: >60 mL/min (ref >60)
Glucose, Bld: 112 mg/dL — ABNORMAL HIGH (ref 70–99)
Potassium: 3.2 mmol/L — ABNORMAL LOW (ref 3.5–5.1)
Sodium: 138 mmol/L (ref 135–145)
Total Bilirubin: 0.3 mg/dL (ref 0.3–1.2)
Total Protein: 7.5 g/dL (ref 6.5–8.1)

## 2021-04-09 LAB — CBC WITH DIFFERENTIAL (CANCER CENTER ONLY)
Abs Immature Granulocytes: 0.05 10*3/uL (ref 0.00–0.07)
Basophils Absolute: 0 10*3/uL (ref 0.0–0.1)
Basophils Relative: 0 %
Eosinophils Absolute: 0.4 10*3/uL (ref 0.0–0.5)
Eosinophils Relative: 5 %
HCT: 40.2 % (ref 36.0–46.0)
Hemoglobin: 13.3 g/dL (ref 12.0–15.0)
Immature Granulocytes: 1 %
Lymphocytes Relative: 22 %
Lymphs Abs: 1.9 10*3/uL (ref 0.7–4.0)
MCH: 28.5 pg (ref 26.0–34.0)
MCHC: 33.1 g/dL (ref 30.0–36.0)
MCV: 86.3 fL (ref 80.0–100.0)
Monocytes Absolute: 0.5 10*3/uL (ref 0.1–1.0)
Monocytes Relative: 6 %
Neutro Abs: 5.8 10*3/uL (ref 1.7–7.7)
Neutrophils Relative %: 66 %
Platelet Count: 290 10*3/uL (ref 150–400)
RBC: 4.66 MIL/uL (ref 3.87–5.11)
RDW: 13.6 % (ref 11.5–15.5)
WBC Count: 8.6 10*3/uL (ref 4.0–10.5)
nRBC: 0 % (ref 0.0–0.2)

## 2021-04-09 LAB — LACTATE DEHYDROGENASE: LDH: 192 U/L (ref 98–192)

## 2021-04-09 NOTE — Progress Notes (Signed)
Initial RN Navigator Patient Visit ? ?Name: Janice Lucas ?Date of Referral : 04/08/2021 ?Diagnosis: Rectal Cancer ? ?Met with patient prior to their visit with MD. Hanley Seamen patient "Your Patient Navigator" handout which explains my role, areas in which I am able to help, and all the contact information for myself and the office. Also gave patient MD and Navigator business card. Reviewed with patient the general overview of expected course after initial diagnosis and time frame for all steps to be completed. ? ?New patient packet given to patient which includes: orientation to office and staff; campus directory; education on My Chart and Advance Directives; and patient centered education on colorectal cancer.  ? ?Patient comes in with her wife. She works full time but her employer is flexible with her medical needs. She does have piercing's, so any MRI needs to be >24 notice as she has to go to the tattoo parlour to have them removed.  ? ?Patient completed visit with Dr. Marin Olp. ? ?She will need a CT chest and MRI pelvis. Will schedule once PA is obtained.  ? ?Dr Lyndel Safe is referring her to Dr Morton Stall at [facility]. ? ?Patient understands all follow up procedures and expectations. They have my number to reach out for any further clarification or additional needs. ? ?Oncology Nurse Navigator Documentation ? ? ?  04/09/2021  ? 11:00 AM  ?Oncology Nurse Navigator Flowsheets  ?Navigator Follow Up Date: 04/10/2021  ?Navigator Follow Up Reason: Appointment Review  ?Navigator Location [facility]  ?Navigator Encounter Type Initial MedOnc  ?Patient Visit Type MedOnc  ?Treatment Phase Pre-Tx/Tx Discussion  ?Barriers/Navigation Needs Coordination of Care;Education  ?Education Newly Diagnosed Cancer Education;Pain/ Symptom Management;Other  ?Interventions Coordination of Care;Education;Psycho-Social Support  ?Acuity Level 2-Minimal Needs (1-2 Barriers Identified)  ?Coordination of Care Radiology  ?Education Method Verbal;Written   ?Support Groups/Services Friends and Family  ?Time Spent with Patient 30  ?  ?

## 2021-04-09 NOTE — Telephone Encounter (Signed)
Noted: ?Previously spoke to pt ?

## 2021-04-09 NOTE — Telephone Encounter (Signed)
See note dated 3/22 ?RG ?

## 2021-04-10 ENCOUNTER — Encounter: Payer: Self-pay | Admitting: *Deleted

## 2021-04-10 LAB — FERRITIN: Ferritin: 55 ng/mL (ref 11–307)

## 2021-04-10 LAB — CEA (IN HOUSE-CHCC): CEA (CHCC-In House): 2.79 ng/mL (ref 0.00–5.00)

## 2021-04-10 NOTE — Progress Notes (Signed)
Referral MD ? 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 ?Chief Complaint  ?Patient presents with  ? Follow-up  ?: They found cancer my rectum. ? ?HPI: Janice Lucas is a very nice 56 year old white female.  She comes in with her wife.  She is very nice.  I enjoyed talking with both of them. ? ?She has never had a colonoscopy.  As such, she underwent a colonoscopy.  She does not have any count of bowel issues.  There is no pain.  There is no bleeding.  She was having no issues with weight loss or weight gain.  She was eating well.  She is having no nausea or vomiting. ? ?She underwent a colonoscopy on 04/03/2021.  Dr. Lyndel Safe of Gastroenterology did this.  He found that there was a small ulcerated nonobstructing mass in the rectum.  The measured 1.2 x 1 cm.  This was biopsied.  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 ? ?The tumor was sent off for molecular studies.  It was proficient MMR. ? ?She is currently [facility] to [facility] for [facility]. ? ?She does not smoke.  She does not drink. ? ?There is no history of cancer in the family. ? ?She has had no cough or shortness of breath.  There is been no leg swelling.  She has had no pain issues. ? ?Overall, I would say her performance status is ECOG 0. ? ? ? ?Past Medical History:  ?Diagnosis Date  ? Allergy   ? SEASONAL - MILD  ? GERD (gastroesophageal reflux disease)   ? ON OMEPRAZOLE  ? Hyperlipidemia   ? ON MEDS  ? Hypertension   ? ON MEDS  ? Sleep apnea   ? WEARS CPAP  ?: ? ? ?Past Surgical History:  ?Procedure Laterality Date  ? ANTERIOR CERVICAL DECOMP/DISCECTOMY FUSION  02/14/2011  ? Procedure: ANTERIOR CERVICAL DECOMPRESSION/DISCECTOMY FUSION 1 LEVEL;  Surgeon: Earleen Newport, MD;  Location: [place] [facility]UR[facility] OR[facility];  Service: Neurosurgery;  Laterality: N/A;  Anterior Cervical Six-Seven Decompression and Fusion   ?: ? ? ?Current Outpatient Medications:  ?  acetaminophen (TYLENOL) 500 MG tablet, Take 500 mg by mouth every 6 (six) hours as needed., Disp: , Rfl:  ?  acetaZOLAMIDE ER (DIAMOX) 500 MG capsule, Take 2 capsules (1,000 mg total) by mouth 2 (two) times daily., Disp: 360 capsule, Rfl: 3 ?  albuterol (VENTOLIN HFA) 108 (90 Base) MCG/ACT inhaler, Inhale 2 puffs into the lungs every 6 (six) hours as needed for wheezing or shortness of breath., Disp: 1 each, Rfl: 0 ?  Ascorbic Acid (VITAMIN C) 1000 MG tablet, Take 1,000 mg by mouth daily., Disp: , Rfl:  ?  aspirin EC 81 MG tablet, Take 81 mg by mouth daily., Disp: , Rfl:  ?  atorvastatin (LIPITOR) 20 MG tablet, Take 1 tablet (20 mg total) by mouth daily., Disp: 90 tablet, Rfl: 3 ?  azelastine (ASTELIN) 0.1 % nasal spray, Place 2 sprays into both nostrils 2 (two) times daily. (Patient taking differently: Place 2 sprays into both nostrils 2 (two) times daily. As needed), Disp: 30 mL, Rfl: 1 ?  Calcium Carb-Cholecalciferol (CALCIUM 1000 + D PO), Take by mouth 2 (two) times daily., Disp: , Rfl:  ?  cholecalciferol (VITAMIN D3) 25 MCG (1000 UT) tablet, Take 1,000 Units by mouth daily., Disp: , Rfl:  ?  fluticasone (FLONASE) 50 MCG/ACT nasal spray, Place 2  sprays into both nostrils daily. (Patient taking differently: Place 2 sprays into both nostrils daily. As needed), Disp: 16 g, Rfl: 6 ?  lisinopril-hydrochlorothiazide (ZESTORETIC) 20-25 MG tablet, Take 1 tablet by mouth daily., Disp: 90 tablet, Rfl: 3 ?  MAGNESIUM PO, Take by mouth., Disp: , Rfl:  ?  nortriptyline (PAMELOR) 50 MG capsule, TAKE 2 CAPSULES BY MOUTH EVERY NIGHT AS DIRECTED, Disp: 180 capsule, Rfl: 3 ?  omeprazole (PRILOSEC) 20 MG capsule, Take 1 capsule (20 mg total) by mouth daily., Disp: 90 capsule, Rfl: 3 ?  sertraline (ZOLOFT) 50 MG tablet, Take 1 tablet (50 mg total) by mouth daily., Disp: 30 tablet, Rfl: 2 ?  topiramate (TOPAMAX) 100 MG tablet, Take 1 tablet (100 mg total) by mouth 2 (two) times daily.,  Disp: 180 tablet, Rfl: 3 ?  vitamin E 1000 UNIT capsule, Take 1,000 Units by mouth daily., Disp: , Rfl: : ? ?: ? ? ?Allergies  ?Allergen Reactions  ? Sulfa Antibiotics Other (See Comments)  ?  Acts "goofy"  ? Neosporin  [Neomycin-Bacitracin Zn-Polymyx] Rash  ?: ? ? ?Family History  ?Problem Relation Age of Onset  ? Prostate cancer Father   ?     METS TO LYMPH NODES AND BLADDER  ? Diabetes Maternal Aunt   ? Diabetes Maternal Uncle   ? Colon cancer Neg Hx   ? Colon polyps Neg Hx   ? Esophageal cancer Neg Hx   ? Rectal cancer Neg Hx   ? Stomach cancer Neg Hx   ?: ? ? ?Social History  ? ?Socioeconomic History  ? Marital status: Married  ?  Spouse name: Not on file  ? Number of children: Not on file  ? Years of education: Not on file  ? Highest education level: Not on file  ?Occupational History  ? Not on file  ?Tobacco Use  ? Smoking status: Former  ?  Packs/day: 0.50  ?  Types: Cigarettes  ? Smokeless tobacco: Current  ? Tobacco comments:  ?  VAPES   ?Vaping Use  ? Vaping Use: Every day  ? Substances: Nicotine, Flavoring  ?Substance and Sexual Activity  ? Alcohol use: No  ?  Comment: occassionally  ? Drug use: Not Currently  ? Sexual activity: Not Currently  ?Other Topics Concern  ? Not on file  ?Social History Narrative  ? Right Handed  ? Lives in a one story home  ? Drinks a big cup of coffee daily  ? ?Social Determinants of Health  ? ?Financial Resource Strain: Not on file  ?Food Insecurity: Not on file  ?Transportation Needs: Not on file  ?Physical Activity: Not on file  ?Stress: Not on file  ?Social Connections: Not on file  ?Intimate Partner Violence: Not on file  ?: ? ?Review of Systems  ?Constitutional: Negative.   ?HENT: Negative.    ?Eyes: Negative.   ?Respiratory: Negative.    ?Cardiovascular: Negative.   ?Gastrointestinal: Negative.   ?Genitourinary: Negative.   ?Musculoskeletal: Negative.   ?Skin: Negative.   ?Neurological: Negative.   ?Endo/Heme/Allergies: Negative.   ?Psychiatric/Behavioral: Negative.     ? ? ?Exam: ?@IPVITALS @ ?Physical Exam ?Vitals reviewed.  ?HENT:  ?   Head: Normocephalic and atraumatic.  ?Eyes:  ?   Pupils: Pupils are equal, round, and reactive to light.  ?Cardiovascular:  ?   Rate and Rhythm: Normal rate and regular rhythm.  ?   Heart sounds: Normal heart sounds.  ?Pulmonary:  ?   Effort: Pulmonary effort is normal.  ?  Breath sounds: Normal breath sounds.  ?Abdominal:  ?   General: Bowel sounds are normal.  ?   Palpations: Abdomen is soft.  ?Musculoskeletal:     ?   General: No tenderness or deformity. Normal range of motion.  ?   Cervical back: Normal range of motion.  ?Lymphadenopathy:  ?   Cervical: No cervical adenopathy.  ?Skin: ?   General: Skin is warm and dry.  ?   Findings: No erythema or rash.  ?Neurological:  ?   Mental Status: She is alert and oriented to person, place, and time.  ?Psychiatric:     ?   Behavior: Behavior normal.     ?   Thought Content: Thought content normal.     ?   Judgment: Judgment normal.  ? ? ?Recent Labs  ?  04/09/21 ?1055  ?WBC 8.6  ?HGB 13.3  ?HCT 40.2  ?PLT 290  ? ? ?Recent Labs  ?  04/09/21 ?1055  ?NA 138  ?K 3.2*  ?CL 104  ?CO2 22  ?GLUCOSE 112*  ?BUN 19  ?CREATININE 0.85  ?CALCIUM 9.0  ? ? ?Blood smear review: None ? ?Pathology: See above ? ? ? ?Assessment and Plan: Janice Lucas is a very nice 56 year old white female.  I would have to think that this is going to be a stage I adenocarcinoma.  It really looks like this was found incredibly early. ? ?I do think that a pelvic MRI would be a reasonable way to further evaluate this.  Again, I think the issue is whether not she will need any kind of surgery or just some type of local resection for this. ? ?At the present time, I would be shocked if she would need any type of neoadjuvant therapy. ? ?Again, I think MRI of the pelvis would be the best way to further evaluate this area. ? ?I would also get a CT of the chest to make sure there is nothing going on with her lungs. ? ?She probably will need to have  referral to Surgical Oncology and see what they would feel would be necessary for this tumor. ? ?Again I had to believe that the outcome here is going to be very very good.  I think she was very fortunate to

## 2021-04-10 NOTE — Progress Notes (Signed)
MRI and CT chest authorized ? ?Patient scheduled for CT chest 04/11/2021. No prep. ? ?Patient scheduled for MRI pelvis at [facility] on 04/12/2021. Patient is NPO after midnight, and must perform a Fleet enema just prior to leaving the home for MRI. Arrival time of 8:30am.  ? ?Reviewed all the above with the patient. She is aware of all information including date, time, location and prep for both appointments.  ? ?She will arrange for piercings to be removed. She also asks for all information to be sent to her via [facility]. This is done.  ? ?Oncology Nurse Navigator Documentation ? ? ?  04/10/2021  ?  9:15 AM  ?Oncology Nurse Navigator Flowsheets  ?Navigator Follow Up Date: 04/11/2021  ?Navigator Follow Up Reason: Scan Review  ?Navigator Location [facility]  ?Navigator Encounter Type Appt/Treatment Plan Review  ?Patient Visit Type MedOnc  ?Treatment Phase Pre-Tx/Tx Discussion  ?Barriers/Navigation Needs Coordination of Care;Education  ?Education Other  ?Interventions Coordination of Care;Education  ?Acuity Level 2-Minimal Needs (1-2 Barriers Identified)  ?Coordination of Care Radiology  ?Education Method Verbal;Teach-back;Written  ?Support Groups/Services Friends and Family  ?Time Spent with Patient 45  ?  ?

## 2021-04-11 ENCOUNTER — Encounter (HOSPITAL_BASED_OUTPATIENT_CLINIC_OR_DEPARTMENT_OTHER): Payer: Self-pay

## 2021-04-11 ENCOUNTER — Encounter: Payer: Self-pay | Admitting: *Deleted

## 2021-04-11 ENCOUNTER — Ambulatory Visit (HOSPITAL_BASED_OUTPATIENT_CLINIC_OR_DEPARTMENT_OTHER)
Admission: RE | Admit: 2021-04-11 | Discharge: 2021-04-11 | Disposition: A | Discharge status: Home / Self Care | Payer: BC Managed Care – PPO | Source: Ambulatory Visit | Attending: Hematology & Oncology | Admitting: Hematology & Oncology

## 2021-04-11 ENCOUNTER — Ambulatory Visit (HOSPITAL_BASED_OUTPATIENT_CLINIC_OR_DEPARTMENT_OTHER): Payer: BC Managed Care – PPO

## 2021-04-11 DIAGNOSIS — C20 Malignant neoplasm of rectum: Secondary | ICD-10-CM | POA: Diagnosis not present

## 2021-04-11 DIAGNOSIS — J9811 Atelectasis: Secondary | ICD-10-CM | POA: Diagnosis not present

## 2021-04-11 MED ORDER — IOHEXOL 300 MG/ML  SOLN
100.0000 mL | Freq: Once | INTRAMUSCULAR | Status: AC | PRN
  Administered 2021-04-11: 80 mL via INTRAVENOUS

## 2021-04-11 NOTE — Progress Notes (Signed)
Patient sent MyChart question regarding her MRI tomorrow. Called MRI and clarified question for her. ? ?CT chest resulted. No obvious metastatic disease. ? ?Oncology Nurse Navigator Documentation ? ? ?  04/11/2021  ?  2:30 PM  ?Oncology Nurse Navigator Flowsheets  ?Navigator Follow Up Date: 04/12/2021  ?Navigator Follow Up Reason: Scan Review  ?Navigator Location [facility]  ?Navigator Encounter Type Scan Review;MyChart  ?Patient Visit Type MedOnc  ?Treatment Phase Pre-Tx/Tx Discussion  ?Barriers/Navigation Needs Coordination of Care;Education  ?Education Other  ?Interventions Education  ?Acuity Level 2-Minimal Needs (1-2 Barriers Identified)  ?Education Method Verbal  ?Support Groups/Services Friends and Family  ?Time Spent with Patient 30  ?  ?

## 2021-04-12 ENCOUNTER — Ambulatory Visit (HOSPITAL_COMMUNITY)
Admission: RE | Admit: 2021-04-12 | Discharge: 2021-04-12 | Disposition: A | Discharge status: Home / Self Care | Payer: BC Managed Care – PPO | Source: Ambulatory Visit | Attending: Hematology & Oncology | Admitting: Hematology & Oncology

## 2021-04-12 ENCOUNTER — Encounter: Payer: Self-pay | Admitting: *Deleted

## 2021-04-12 DIAGNOSIS — D492 Neoplasm of unspecified behavior of bone, soft tissue, and skin: Secondary | ICD-10-CM | POA: Diagnosis not present

## 2021-04-12 DIAGNOSIS — K5730 Diverticulosis of large intestine without perforation or abscess without bleeding: Secondary | ICD-10-CM | POA: Diagnosis not present

## 2021-04-12 DIAGNOSIS — C20 Malignant neoplasm of rectum: Secondary | ICD-10-CM | POA: Insufficient documentation

## 2021-04-12 NOTE — Telephone Encounter (Signed)
Pt stated that she has an appointment to see Dr Lenise Arena on 04/18/2021 (Next Thursday):  ? ?

## 2021-04-15 ENCOUNTER — Telehealth: Payer: Self-pay | Admitting: *Deleted

## 2021-04-15 NOTE — Telephone Encounter (Signed)
Message received per Dr Marin Olp.  "Unfortunately, the tumor is much more invasive than I would have thought.  She is clearly going to need treatment before any surgery.  We are going to have to get her back in here to discuss this.  If possible, please try to find sometime this week or next week at the end of the day.  This must be a 4:00 appointment. " ? ?High priority scheduling note sent to schedule this patient in the next few weeks to discuss treatment plan.   ?

## 2021-04-16 ENCOUNTER — Encounter: Payer: Self-pay | Admitting: *Deleted

## 2021-04-16 NOTE — Progress Notes (Signed)
Reviewed patient's MRI which shows more invasion than previously suspected. Per Dr Marin Olp she may need neoadjuvant treatment.  ? ?Received a call from patient's wife, Janice Lucas. She states that patient received a call from our office to schedule an appointment with her for next week and she's "freaking out" because that means she needs treatment.  ? ?I reviewed the MRI with Janice Lucas and explained why Dr Marin Olp has potentially altered the treatment plan. They have an appointment with Dr Morton Stall on Thursday and I encouraged them to wait until the spoke with him to get his take on the MRI and whether or not neoadjuvant would be necessary. Janice Lucas will call be after their appointment and let me know what his recommendation is, and if we need to start coordinating neoadjuvant treatment, we will look at moving up her 4/14 appointment.  ? ?Oncology Nurse Navigator Documentation ? ? ?  04/16/2021  ? 12:30 PM  ?Oncology Nurse Navigator Flowsheets  ?Navigator Follow Up Date: 04/18/2021  ?Navigator Follow Up Reason: Review Note  ?Navigator Location [facility]  ?Navigator Encounter Type Scan Review;Telephone  ?Telephone Education;Incoming Call  ?Patient Visit Type MedOnc  ?Treatment Phase Pre-Tx/Tx Discussion  ?Barriers/Navigation Needs Coordination of Care;Education  ?Education Other  ?Interventions Education;Psycho-Social Support  ?Acuity Level 2-Minimal Needs (1-2 Barriers Identified)  ?Education Method Verbal  ?Support Groups/Services Friends and Family  ?Time Spent with Patient 30  ?  ?

## 2021-04-17 ENCOUNTER — Encounter: Payer: Self-pay | Admitting: *Deleted

## 2021-04-17 ENCOUNTER — Telehealth: Payer: Self-pay | Admitting: Gastroenterology

## 2021-04-17 ENCOUNTER — Ambulatory Visit (HOSPITAL_BASED_OUTPATIENT_CLINIC_OR_DEPARTMENT_OTHER)
Admission: RE | Admit: 2021-04-17 | Discharge: 2021-04-17 | Disposition: A | Discharge status: Home / Self Care | Payer: BC Managed Care – PPO | Source: Ambulatory Visit | Attending: Hematology & Oncology | Admitting: Hematology & Oncology

## 2021-04-17 DIAGNOSIS — C20 Malignant neoplasm of rectum: Secondary | ICD-10-CM | POA: Insufficient documentation

## 2021-04-17 DIAGNOSIS — I700 Atherosclerosis of aorta: Secondary | ICD-10-CM | POA: Diagnosis not present

## 2021-04-17 MED ORDER — IOHEXOL 300 MG/ML  SOLN
100.0000 mL | Freq: Once | INTRAMUSCULAR | Status: AC | PRN
  Administered 2021-04-17: 100 mL via INTRAVENOUS

## 2021-04-17 NOTE — Progress Notes (Signed)
Patient called stating Dr Donnella Bi office called and they want a CT AP done ASAP. She had previously been scheduled for one on 04/19/21 but this was deemed unnecessary by Dr Marin Olp.  ? ?Spoke to [employer] PA at [phone] and they request that a CT AP be completed prior to patient's visit with Dr Donnella Bi tomorrow at [facility] states that after her appointment tomorrow, she will be presented at their [facility] on 04/19/2021. ? ?Placed new order. Obtained auth and scheduled for today at 4pm. Patient knows to be NPO after 10am. She also knows to pick up her contrast that she will need to drink at 2p and 3p. Instructed her to ask for a CD of the images once she was done to bring with her.  ? ?Oncology Nurse Navigator Documentation ? ? ?  04/17/2021  ?  9:00 AM  ?Oncology Nurse Navigator Flowsheets  ?Navigator Follow Up Date: 04/18/2021  ?Navigator Follow Up Reason: Review Note  ?Navigator Location [facility]  ?Navigator Encounter Type Appt/Treatment Plan Review;Education;Telephone  ?Telephone Appt Confirmation/Clarification;Education;Outgoing Call  ?Patient Visit Type MedOnc  ?Treatment Phase Pre-Tx/Tx Discussion  ?Barriers/Navigation Needs Coordination of Care;Education  ?Education Other  ?Interventions Coordination of Care;Education;Psycho-Social Support  ?Acuity Level 2-Minimal Needs (1-2 Barriers Identified)  ?Coordination of Care Radiology;Other  ?Education Method Verbal  ?Support Groups/Services Friends and Family  ?Time Spent with Patient 60  ?  ?

## 2021-04-17 NOTE — Telephone Encounter (Signed)
Spoke with Edwena Felty PA with Colorectal Surgery regarding the CT scan that's was ordered by Dr. Lyndel Safe : Edwena Felty, PA stated that the wanted the order done STAT: Lysbeth Galas stated that we could cancel our order and they could order it STAT with the condition of [place] Canceling our  order and Calling [employer] and notifying them: ?Order was canceled, [employer] notified that order was canceled and to retract that request: Cameon Made aware along with Charlsie Merles RN pt clinical navigator: Pt was made aware ?Pt verbalized understanding with all questions answered.  ? ?

## 2021-04-17 NOTE — Telephone Encounter (Signed)
Received a call from [facility], [place] with colorectal surgery, regarding the CT Abd & Pelvis ordered for patient.  She wanted to make sure this was ordered stat as they will need the results ASAP.  Please call her at [phone] and she will come out of the patient room to speak with you. ?

## 2021-04-18 ENCOUNTER — Encounter: Payer: Self-pay | Admitting: *Deleted

## 2021-04-18 DIAGNOSIS — K629 Disease of anus and rectum, unspecified: Secondary | ICD-10-CM | POA: Diagnosis not present

## 2021-04-18 NOTE — Progress Notes (Addendum)
Janice Napoleon, MD  P Onc Nurse Hp Cc: [name], RN ?Call - there is no evidence of metastatic disease.  We need to get her in and talk to her about therapy before surgery.  Pete  ? ?Patient saw Dr Morton Stall this morning. He concurs with Dr Marin Olp with that patient will need chemo/radiation prior to surgery. Patient is scheduled for sigmoidoscopy with biopsy on 4/11 and follow up in our office on 4/14. She does confirm she wants to get her systemic and radiation therapy at this office and surgery with Dr Morton Stall.  ? ?Patient knows to contact the office with any questions or concerns.  ? ?Oncology Nurse Navigator Documentation ? ? ?  04/18/2021  ?  1:45 PM  ?Oncology Nurse Navigator Flowsheets  ?Navigator Follow Up Date: 04/26/2021  ?Navigator Follow Up Reason: Follow-up Appointment  ?Navigator Location [facility]  ?Navigator Encounter Type Telephone  ?Telephone Patient Update;Outgoing Call  ?Patient Visit Type MedOnc  ?Treatment Phase Pre-Tx/Tx Discussion  ?Barriers/Navigation Needs Coordination of Care;Education  ?Interventions Psycho-Social Support  ?Acuity Level 2-Minimal Needs (1-2 Barriers Identified)  ?Support Groups/Services Friends and Family  ?Time Spent with Patient 30  ?  ?

## 2021-04-19 ENCOUNTER — Other Ambulatory Visit (HOSPITAL_BASED_OUTPATIENT_CLINIC_OR_DEPARTMENT_OTHER): Payer: BC Managed Care – PPO

## 2021-04-23 DIAGNOSIS — C20 Malignant neoplasm of rectum: Secondary | ICD-10-CM | POA: Diagnosis not present

## 2021-04-23 DIAGNOSIS — K629 Disease of anus and rectum, unspecified: Secondary | ICD-10-CM | POA: Diagnosis not present

## 2021-04-26 ENCOUNTER — Inpatient Hospital Stay: Payer: BC Managed Care – PPO | Attending: Hematology & Oncology | Admitting: Hematology & Oncology

## 2021-04-26 ENCOUNTER — Other Ambulatory Visit (HOSPITAL_COMMUNITY): Payer: Self-pay

## 2021-04-26 ENCOUNTER — Encounter: Payer: Self-pay | Admitting: Hematology & Oncology

## 2021-04-26 DIAGNOSIS — Z87891 Personal history of nicotine dependence: Secondary | ICD-10-CM | POA: Diagnosis not present

## 2021-04-26 DIAGNOSIS — C20 Malignant neoplasm of rectum: Secondary | ICD-10-CM | POA: Insufficient documentation

## 2021-04-26 DIAGNOSIS — Z5111 Encounter for antineoplastic chemotherapy: Secondary | ICD-10-CM | POA: Diagnosis not present

## 2021-04-26 DIAGNOSIS — Z7189 Other specified counseling: Secondary | ICD-10-CM | POA: Insufficient documentation

## 2021-04-26 HISTORY — DX: Malignant neoplasm of rectum: C20

## 2021-04-26 HISTORY — DX: Other specified counseling: Z71.89

## 2021-04-26 MED ORDER — CAPECITABINE 500 MG PO TABS
1000.0000 mg/m2 | ORAL_TABLET | Freq: Two times a day (BID) | ORAL | 3 refills | Status: DC
  Filled 2021-04-26: qty 140, 14d supply, fill #0

## 2021-04-26 MED ORDER — CAPECITABINE 500 MG PO TABS
850.0000 mg/m2 | ORAL_TABLET | Freq: Two times a day (BID) | ORAL | 3 refills | Status: DC
Start: 2021-04-26 — End: 2021-10-09
  Filled 2021-04-26: qty 112, 14d supply, fill #0
  Filled 2021-04-30: qty 112, 21d supply, fill #0
  Filled 2021-05-13: qty 112, 21d supply, fill #1

## 2021-04-26 NOTE — Progress Notes (Signed)
?Hematology and Oncology Follow Up Visit ? ?Janice Lucas ?[id number] ?03-21-1965 56 y.o. ?04/26/2021 ? ? 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 ?Current Therapy:   ?Neoadjuvant Xeloda/oxaliplatin --start on 05/03/2021 ?    ?Interim History:  Janice Lucas is back for follow-up.  This is her second office visit.  She has been quite busy since we last saw her.  We basically did staging studies with her.  She had a an MRI done.  This was of the pelvis.  This was a dedicated rectal MRI.  This was done on March 23.  This showed a 1.5 cm tumor that invaded through the muscularis propria.  There is no involvement of lateral muscles.  There is no invasion of the anterior peritoneal reflection.  There is no obvious lymphadenopathy. ? ?It is felt that this was staged at IIA (T3bN0M0). ? ?She was seen by Dr. Morton Stall at [facility].  He did a flexible sigmoidoscopy.  He did a biopsy.  The pathology report ([id number]) showed an moderately differentiated adenocarcinoma. ? ?He agreed that she needed neoadjuvant therapy before surgery. ? ?She is young.  She is in good shape.  I do that she would benefit from "total" neoadjuvant therapy.  I will give her upfront systemic chemotherapy.  I would then use low-dose chemotherapy with radiation therapy. ? ?I think that she would be a good candidate for Xeloda/oxaliplatin.  I would use this for 4 cycles.  I would then use Xeloda with radiation therapy. ? ?Again, she is in good shape.  She really has had no problems with nausea or vomiting.  There is been no rectal bleeding.  She has had no leg swelling.  She has had no fever. ? ?Overall, I would say performance status right now is ECOG 0.   ? ?Medications:  ?Current Outpatient Medications:  ?  acetaminophen (TYLENOL) 500 MG tablet, Take 500 mg by mouth every 6 (six) hours as needed., Disp: , Rfl:  ?  acetaZOLAMIDE ER (DIAMOX) 500 MG capsule, Take 2 capsules (1,000 mg total) by mouth 2 (two) times daily.,  Disp: 360 capsule, Rfl: 3 ?  albuterol (VENTOLIN HFA) 108 (90 Base) MCG/ACT inhaler, Inhale 2 puffs into the lungs every 6 (six) hours as needed for wheezing or shortness of breath., Disp: 1 each, Rfl: 0 ?  Ascorbic Acid (VITAMIN C) 1000 MG tablet, Take 1,000 mg by mouth daily., Disp: , Rfl:  ?  aspirin EC 81 MG tablet, Take 81 mg by mouth daily., Disp: , Rfl:  ?  atorvastatin (LIPITOR) 20 MG tablet, Take 1 tablet (20 mg total) by mouth daily., Disp: 90 tablet, Rfl: 3 ?  azelastine (ASTELIN) 0.1 % nasal spray, Place 2 sprays into both nostrils 2 (two) times daily. (Patient taking differently: Place 2 sprays into both nostrils 2 (two) times daily. As needed), Disp: 30 mL, Rfl: 1 ?  Calcium Carb-Cholecalciferol (CALCIUM 1000 + D PO), Take by mouth 2 (two) times daily., Disp: , Rfl:  ?  cholecalciferol (VITAMIN D3) 25 MCG (1000 UT) tablet, Take 1,000 Units by mouth daily., Disp: , Rfl:  ?  fluticasone (FLONASE) 50 MCG/ACT nasal spray, Place 2 sprays into both nostrils daily. (Patient taking differently: Place 2 sprays into both nostrils daily. As needed), Disp: 16 g, Rfl: 6 ?  lisinopril-hydrochlorothiazide (ZESTORETIC) 20-25 MG tablet, Take 1 tablet by mouth daily., Disp: 90 tablet, Rfl: 3 ?  MAGNESIUM PO, Take by mouth., Disp: , Rfl:  ?  nortriptyline (PAMELOR) 50  MG capsule, TAKE 2 CAPSULES BY MOUTH EVERY NIGHT AS DIRECTED, Disp: 180 capsule, Rfl: 3 ?  omeprazole (PRILOSEC) 20 MG capsule, Take 1 capsule (20 mg total) by mouth daily., Disp: 90 capsule, Rfl: 3 ?  sertraline (ZOLOFT) 25 MG tablet, Take 12.5 mg by mouth daily., Disp: , Rfl:  ?  topiramate (TOPAMAX) 100 MG tablet, Take 1 tablet (100 mg total) by mouth 2 (two) times daily., Disp: 180 tablet, Rfl: 3 ?  vitamin E 1000 UNIT capsule, Take 1,000 Units by mouth daily., Disp: , Rfl:  ? ?Allergies:  ?Allergies  ?Allergen Reactions  ? Sulfa Antibiotics Other (See Comments)  ?  Acts "goofy"  ? Neosporin  [Neomycin-Bacitracin Zn-Polymyx] Rash  ? ? ?Past Medical  History, Surgical history, Social history, and Family History were reviewed and updated. ? ?Review of Systems: ?Review of Systems  ?Constitutional: Negative.   ?HENT:  Negative.    ?Eyes: Negative.   ?Respiratory: Negative.    ?Cardiovascular: Negative.   ?Gastrointestinal: Negative.   ?Endocrine: Negative.   ?Genitourinary: Negative.    ?Musculoskeletal: Negative.   ?Skin: Negative.   ?Neurological: Negative.   ?Hematological: Negative.   ?Psychiatric/Behavioral: Negative.    ? ?Physical Exam: ? weight is 300 lb (136.1 kg). Her oral temperature is 98.3 ?F (36.8 ?C). Her blood pressure is 112/63 and her pulse is 86. Her respiration is 18 and oxygen saturation is 97%.  ? ?Wt Readings from Last 3 Encounters:  ?04/26/21 300 lb (136.1 kg)  ?04/09/21 296 lb (134.3 kg)  ?04/03/21 (!) 304 lb (137.9 kg)  ? ? ?Physical Exam ?Vitals reviewed.  ?HENT:  ?   Head: Normocephalic and atraumatic.  ?Eyes:  ?   Pupils: Pupils are equal, round, and reactive to light.  ?Cardiovascular:  ?   Rate and Rhythm: Normal rate and regular rhythm.  ?   Heart sounds: Normal heart sounds.  ?Pulmonary:  ?   Effort: Pulmonary effort is normal.  ?   Breath sounds: Normal breath sounds.  ?Abdominal:  ?   General: Bowel sounds are normal.  ?   Palpations: Abdomen is soft.  ?Musculoskeletal:     ?   General: No tenderness or deformity. Normal range of motion.  ?   Cervical back: Normal range of motion.  ?Lymphadenopathy:  ?   Cervical: No cervical adenopathy.  ?Skin: ?   General: Skin is warm and dry.  ?   Findings: No erythema or rash.  ?Neurological:  ?   Mental Status: She is alert and oriented to person, place, and time.  ?Psychiatric:     ?   Behavior: Behavior normal.     ?   Thought Content: Thought content normal.     ?   Judgment: Judgment normal.  ? ? ? ?Lab Results  ?Component Value Date  ? WBC 8.6 04/09/2021  ? HGB 13.3 04/09/2021  ? HCT 40.2 04/09/2021  ? MCV 86.3 04/09/2021  ? PLT 290 04/09/2021  ? ?  Chemistry   ?   ?Component Value  Date/Time  ? NA 138 04/09/2021 1055  ? K 3.2 (L) 04/09/2021 1055  ? CL 104 04/09/2021 1055  ? CO2 22 04/09/2021 1055  ? BUN 19 04/09/2021 1055  ? CREATININE 0.85 04/09/2021 1055  ? CREATININE 0.84 11/15/2019 1516  ?    ?Component Value Date/Time  ? CALCIUM 9.0 04/09/2021 1055  ? ALKPHOS 70 04/09/2021 1055  ? AST 15 04/09/2021 1055  ? ALT 15 04/09/2021 1055  ? BILITOT 0.3  04/09/2021 1055  ?  ? ? ?Impression and Plan: ?Janice Lucas is a very charming 56 year old white female.  She has a localized rectal cancer.  By staging studies looks like this is a stage IIA. ? ?I really think that we need to be aggressive.  Again I would use upfront systemic chemotherapy.  I would then use low-dose chemotherapy and radiation therapy. ? ?I think that she would be a good candidate for Xeloda/oxaliplatin.  I gave her information sheets about each medication. ? ?I do not think she needs to have a Port-A-Cath in.  I only plan on using for cycles of oxaliplatin. ? ?We would like to try to get started next week. ? ?I recommend that she does not have dMMR/H-MSI.  As such, we do not need to use immunotherapy. ? ?After 4 cycles of treatment, we will then repeat her MRI and to see how well she is responded. ? ?Hopefully, we will be able to get her into a complete remission at the time of surgery and she will have no active disease when she does have surgical resection. ? ?I will plan to see her back when she starts her second cycle of treatment in May. ? ? ?Volanda Napoleon, MD ?4/14/20235:26 PM  ?

## 2021-04-26 NOTE — Progress Notes (Signed)
START ON PATHWAY REGIMEN - Colorectal ? ? ?  A cycle is every 21 days: ?    Capecitabine  ?    Oxaliplatin  ? ?**Always confirm dose/schedule in your pharmacy ordering system** ? ?Patient Characteristics: ?Preoperative or Nonsurgical Candidate (Clinical Staging), Rectal, cT3 - cT4, cN0 or Any cT, cN+ ?Tumor Location: Rectal ?Therapeutic Status: Preoperative or Nonsurgical Candidate (Clinical Staging) ?AJCC T Category: cT3 ?AJCC N Category: cN0 ?AJCC M Category: cM0 ?AJCC 8 Stage Grouping: IIA ?Intent of Therapy: ?Curative Intent, Discussed with Patient ?

## 2021-04-28 ENCOUNTER — Encounter: Payer: Self-pay | Admitting: Family Medicine

## 2021-04-28 DIAGNOSIS — Z23 Encounter for immunization: Secondary | ICD-10-CM

## 2021-04-29 ENCOUNTER — Telehealth: Payer: Self-pay | Admitting: Pharmacy Technician

## 2021-04-29 ENCOUNTER — Encounter: Payer: Self-pay | Admitting: Hematology & Oncology

## 2021-04-29 ENCOUNTER — Telehealth: Payer: Self-pay | Admitting: *Deleted

## 2021-04-29 ENCOUNTER — Telehealth: Payer: Self-pay | Admitting: Pharmacist

## 2021-04-29 ENCOUNTER — Other Ambulatory Visit: Payer: Self-pay | Admitting: Family

## 2021-04-29 ENCOUNTER — Other Ambulatory Visit: Payer: Self-pay | Admitting: *Deleted

## 2021-04-29 ENCOUNTER — Other Ambulatory Visit (HOSPITAL_COMMUNITY): Payer: Self-pay

## 2021-04-29 DIAGNOSIS — C20 Malignant neoplasm of rectum: Secondary | ICD-10-CM

## 2021-04-29 MED ORDER — LIDOCAINE-PRILOCAINE 2.5-2.5 % EX CREA: TOPICAL_CREAM | CUTANEOUS | 3 refills | Status: DC

## 2021-04-29 MED ORDER — ONDANSETRON HCL 8 MG PO TABS: 8.0000 mg | ORAL_TABLET | Freq: Two times a day (BID) | ORAL | 1 refills | Status: DC | PRN

## 2021-04-29 MED ORDER — DEXAMETHASONE 4 MG PO TABS: 8.0000 mg | ORAL_TABLET | Freq: Every day | ORAL | 1 refills | Status: DC

## 2021-04-29 MED ORDER — PROCHLORPERAZINE MALEATE 10 MG PO TABS: 10.0000 mg | ORAL_TABLET | Freq: Four times a day (QID) | ORAL | 1 refills | Status: DC | PRN

## 2021-04-29 NOTE — Telephone Encounter (Signed)
Patients spouse, [name], called stating that patient has decided to get a portacath after listening to advice from friends that have had the same experience.  Dr Marin Olp is fine with this.  Port appointment arranged for Thursday 4/20 and chemo education arranged for Wed at 4p.  Instructions given to [name] and questions answered.  Orders for port placed.  PAtient appreciates call.  ?

## 2021-04-29 NOTE — Telephone Encounter (Signed)
Oral Oncology Pharmacist Encounter ? ?Received new prescription for Xeloda (capecitabine) for the neoadjuvant treatment of rectal cancer in conjunction with oxaliplatin, planned duration 4 cycles. ? ?CBC w/ Diff and CMP from 04/09/21 assessed, no baseline dose adjustments required. Prescription dose and frequency assessed for appropriateness.  ? ?Current medication list in Epic reviewed, DDIs with Xeloda identified: ?Category C DDI between Xeloda and Omeprazole - proton-pump inhibitors can decrease efficacy of Xeloda - will discuss with patient alternatives to omeprazole, such as H2RA's like famotidine while on Xeloda. ? ?Evaluated chart and no patient barriers to medication adherence noted.  ? ?Prescription has been e-scribed to the [facility] for benefits analysis and approval. ? ?Oral Oncology Clinic will continue to follow for insurance authorization, copayment issues, initial counseling and start date. ? ?Leron Croak, PharmD, BCPS ?Hematology/Oncology Clinical Pharmacist ?[facility] Oral Chemotherapy Navigation Clinics ?[phone] ?04/29/2021 8:39 AM ? ?

## 2021-04-29 NOTE — Telephone Encounter (Signed)
Oral Oncology Patient Advocate Encounter ?  ?Received notification from [facility] that prior authorization for Capecitabine (Xeloda) is required. ?  ?PA submitted on CoverMyMeds ?Key [id number] ?Status is pending ?  ?Oral Oncology Clinic will continue to follow. ? ?Dennison Nancy CPHT ?Specialty Pharmacy Patient Advocate ?[place] ?Phone [phone] ?Fax [phone] ?04/29/2021 8:52 AM ? ?

## 2021-04-29 NOTE — Telephone Encounter (Signed)
Oral Oncology Patient Advocate Encounter ? ?Prior Authorization for Capecitabine (Xeloda) has been approved.   ? ?PA# [id number] ?Effective dates: 04/29/21 through 04/28/22 ? ?Oral Oncology Clinic will continue to follow.  ? ?Dennison Nancy CPHT ?Specialty Pharmacy Patient Advocate ?[place] ?Phone [phone] ?Fax [phone] ?04/29/2021 11:31 AM ? ?

## 2021-04-29 NOTE — Progress Notes (Signed)
Pharmacist Chemotherapy Monitoring - Initial Assessment   ? ?Anticipated start date: 05/03/21  ? ?The following has been reviewed per standard work regarding the patient's treatment regimen: ?The patient's diagnosis, treatment plan and drug doses, and organ/hematologic function ?Lab orders and baseline tests specific to treatment regimen  ?The treatment plan start date, drug sequencing, and pre-medications ?Prior authorization status  ?Patient's documented medication list, including drug-drug interaction screen and prescriptions for anti-emetics and supportive care specific to the treatment regimen ?The drug concentrations, fluid compatibility, administration routes, and timing of the medications to be used ?The patient's access for treatment and lifetime cumulative dose history, if applicable  ?The patient's medication allergies and previous infusion related reactions, if applicable  ? ?Changes made to treatment plan:  ?treatment plan date ? ?Follow up needed:  ?N/A ? ? ?Janice Lucas, [facility], ?04/29/2021  10:55 AM  ?

## 2021-04-30 ENCOUNTER — Ambulatory Visit (INDEPENDENT_AMBULATORY_CARE_PROVIDER_SITE_OTHER): Payer: BC Managed Care – PPO | Admitting: Adult Health

## 2021-04-30 ENCOUNTER — Encounter: Payer: Self-pay | Admitting: Adult Health

## 2021-04-30 ENCOUNTER — Other Ambulatory Visit (HOSPITAL_COMMUNITY): Payer: Self-pay

## 2021-04-30 ENCOUNTER — Encounter: Payer: Self-pay | Admitting: *Deleted

## 2021-04-30 DIAGNOSIS — F4321 Adjustment disorder with depressed mood: Secondary | ICD-10-CM | POA: Diagnosis not present

## 2021-04-30 NOTE — Telephone Encounter (Signed)
Test claim processed with $100 copay. ?

## 2021-04-30 NOTE — Telephone Encounter (Signed)
Oral Chemotherapy Pharmacist Encounter ? ?I spoke with patient for overview of: Xeloda (capecitabine) for the neoadjuvant treatment of rectal cancer in conjunction with oxaliplatin, planned duration 4 cycles. ? ?Counseled patient on administration, dosing, side effects, monitoring, drug-food interactions, safe handling, storage, and disposal. ? ?Patient will take Xeloda '500mg'$  tablets, 4 tablets ('2000mg'$ ) by mouth in AM and 4 tabs ('2000mg'$ ) by mouth in PM, within 30 minutes of finishing meals, on days 1-14 of each 21 day cycle.  ? ?Oxaliplatin will be infused on day 1 of each 21 day cycle. ? ?Xeloda and oxaliplatin start date: 05/03/21 ? ?Adverse effects include but are not limited to: fatigue, decreased blood counts, GI upset, diarrhea, mouth sores, and hand-foot syndrome. ?Patient has anti-emetic on hand and knows to take it if nausea develops.   ?Patient will obtain anti diarrheal and alert the office of 4 or more loose stools above baseline. ? ?Reviewed with patient importance of keeping a medication schedule and plan for any missed doses. No barriers to medication adherence identified. ? ?Medication reconciliation performed and medication/allergy list updated. Discussed drug-drug interaction between Xeloda and omeprazole and risk for decreased efficacy of Xeloda with concomitant use. Patient OK to hold omeprazole while on Xeloda and use famotidine or Tums PRN for acid reflux. ? ?Insurance authorization for Xeloda has been obtained. This will ship from the [place] on 04/30/21 to deliver to patient's home on 05/01/21. ? ?Patient informed the pharmacy will reach out 5-7 days prior to needing next fill of Xeloda to coordinate continued medication acquisition to prevent break in therapy. ? ?All questions answered. ? ?Ms. Coone voiced understanding and appreciation.  ? ?Medication education handout and medication calendar placed in mail for patient. Patient knows to call the office with questions  or concerns. Oral Chemotherapy Clinic phone number provided to patient.  ? ?Leron Croak, PharmD, BCPS ?Hematology/Oncology Clinical Pharmacist ?[facility] Oral Chemotherapy Navigation Clinics ?[phone] ?04/30/2021 11:16 AM ? ?

## 2021-04-30 NOTE — Progress Notes (Addendum)
HEIDI MACLIN ?[id number] ?03-04-1965 ?56 y.o. ? ?Virtual Visit via Telephone Note ? ?I connected with pt on 04/30/21 at  3:40 PM EDT by telephone and verified that I am speaking with the correct person using two identifiers. ?  ?I discussed the limitations, risks, security and privacy concerns of performing an evaluation and management service by telephone and the availability of in person appointments. I also discussed with the patient that there may be a patient responsible charge related to this service. The patient expressed understanding and agreed to proceed. ?  ?I discussed the assessment and treatment plan with the patient. The patient was provided an opportunity to ask questions and all were answered. The patient agreed with the plan and demonstrated an understanding of the instructions. ?  ?The patient was advised to call back or seek an in-person evaluation if the symptoms worsen or if the condition fails to improve as anticipated. ? ?I provided 10 minutes of non-face-to-face time during this encounter.  The patient was located at home.  The provider was located at [place]. ? ? ?Aloha Gell, NP ? ? ?Subjective:  ? ?Patient ID:  DESHANTA LADY is a 56 y.o. (DOB 07/16/1965) female. ? ?Chief Complaint: No chief complaint on file. ? ? ?HPI ?Sherald Hess presents for follow-up of grief reaction. ? ? ?Describes mood today as "better". Pleasant. Tearful at times. Mood symptoms - reports decreased depression, anxiety, and irritability. Feels more leveled out. Has been taking 1/2 tablet of Zoloft 43m daily. Stating "it's cutting the edge off". Recent colonoscopy revealed early stage rectal cancer - has met with [facility] and will be starting treatments soon - prognosis good. Stable interest and motivation. ?Energy levels improved. Active, does not have a regular exercise routine.  ?Enjoys some usual interests and activities[place] Mar[place]. Lives with wife of 15 years - 4  dogs. Wife has a 239year old son. Spending time with family. ?Appetite adequate. Weight gain. ?Sleeps well most nights. Averages 8 hours. Using CPAP nightly. ?Focus and concentration stable. Completing tasks. Managing aspects of household. Working full time 40 to 50 hours a week. ?Denies SI or HI.  ?Denies AH or VH. ? ?Previous medication trials: Denies ? ? ?Review of Systems:  ?Review of Systems  ?Musculoskeletal:  Negative for gait problem.  ?Neurological:  Negative for tremors.  ?Psychiatric/Behavioral:    ?     Please refer to HPI  ? ?Medications: I have reviewed the patient's current medications. ? ?Current Outpatient Medications  ?Medication Sig Dispense Refill  ? acetaminophen (TYLENOL) 500 MG tablet Take 500 mg by mouth every 6 (six) hours as needed.    ? acetaZOLAMIDE ER (DIAMOX) 500 MG capsule Take 2 capsules (1,000 mg total) by mouth 2 (two) times daily. 360 capsule 3  ? albuterol (VENTOLIN HFA) 108 (90 Base) MCG/ACT inhaler Inhale 2 puffs into the lungs every 6 (six) hours as needed for wheezing or shortness of breath. 1 each 0  ? Ascorbic Acid (VITAMIN C) 1000 MG tablet Take 1,000 mg by mouth daily.    ? aspirin EC 81 MG tablet Take 81 mg by mouth daily.    ? atorvastatin (LIPITOR) 20 MG tablet Take 1 tablet (20 mg total) by mouth daily. 90 tablet 3  ? azelastine (ASTELIN) 0.1 % nasal spray Place 2 sprays into both nostrils 2 (two) times daily. (Patient taking differently: Place 2 sprays into both nostrils 2 (two) times daily. As needed) 30 mL 1  ?  Calcium Carb-Cholecalciferol (CALCIUM 1000 + D PO) Take by mouth 2 (two) times daily.    ? capecitabine (XELODA) 500 MG tablet Take 4 tablets (2,000 mg total) by mouth 2 (two) times daily after a meal. Take twice a day for 14 days on and 7 days off. 112 tablet 3  ? cholecalciferol (VITAMIN D3) 25 MCG (1000 UT) tablet Take 1,000 Units by mouth daily.    ? dexamethasone (DECADRON) 4 MG tablet Take 2 tablets (8 mg total) by mouth daily. Start the day after  chemotherapy for 2 days. Take with food. 30 tablet 1  ? fluticasone (FLONASE) 50 MCG/ACT nasal spray Place 2 sprays into both nostrils daily. (Patient taking differently: Place 2 sprays into both nostrils daily. As needed) 16 g 6  ? lidocaine-prilocaine (EMLA) cream Apply to affected area once 30 g 3  ? lisinopril-hydrochlorothiazide (ZESTORETIC) 20-25 MG tablet Take 1 tablet by mouth daily. 90 tablet 3  ? MAGNESIUM PO Take by mouth.    ? nortriptyline (PAMELOR) 50 MG capsule TAKE 2 CAPSULES BY MOUTH EVERY NIGHT AS DIRECTED 180 capsule 3  ? omeprazole (PRILOSEC) 20 MG capsule Take 1 capsule (20 mg total) by mouth daily. (Patient not taking: Reported on 04/30/2021) 90 capsule 3  ? ondansetron (ZOFRAN) 8 MG tablet Take 1 tablet (8 mg total) by mouth 2 (two) times daily as needed for refractory nausea / vomiting. Start on day 3 after chemotherapy. 30 tablet 1  ? prochlorperazine (COMPAZINE) 10 MG tablet Take 1 tablet (10 mg total) by mouth every 6 (six) hours as needed (Nausea or vomiting). 30 tablet 1  ? sertraline (ZOLOFT) 25 MG tablet Take 12.5 mg by mouth daily.    ? topiramate (TOPAMAX) 100 MG tablet Take 1 tablet (100 mg total) by mouth 2 (two) times daily. 180 tablet 3  ? vitamin E 1000 UNIT capsule Take 1,000 Units by mouth daily.    ? ?No current facility-administered medications for this visit.  ? ? ?Medication Side Effects: None ? ?Allergies:  ?Allergies  ?Allergen Reactions  ? Sulfa Antibiotics Other (See Comments)  ?  Acts "goofy"  ? Neosporin  [Neomycin-Bacitracin Zn-Polymyx] Rash  ? ? ?Past Medical History:  ?Diagnosis Date  ? Allergy   ? SEASONAL - MILD  ? GERD (gastroesophageal reflux disease)   ? ON OMEPRAZOLE  ? Goals of care, counseling/discussion 04/26/2021  ? Hyperlipidemia   ? ON MEDS  ? Hypertension   ? ON MEDS  ? Rectal cancer ([place]) 04/26/2021  ? Sleep apnea   ? WEARS CPAP  ? ? ?Family History  ?Problem Relation Age of Onset  ? Prostate cancer Father   ?     METS TO LYMPH NODES AND BLADDER  ?  Diabetes Maternal Aunt   ? Diabetes Maternal Uncle   ? Colon cancer Neg Hx   ? Colon polyps Neg Hx   ? Esophageal cancer Neg Hx   ? Rectal cancer Neg Hx   ? Stomach cancer Neg Hx   ? ? ?Social History  ? ?Socioeconomic History  ? Marital status: Married  ?  Spouse name: Not on file  ? Number of children: Not on file  ? Years of education: Not on file  ? Highest education level: Not on file  ?Occupational History  ? Not on file  ?Tobacco Use  ? Smoking status: Former  ?  Packs/day: 0.50  ?  Types: Cigarettes  ? Smokeless tobacco: Current  ? Tobacco comments:  ?  VAPES   ?  Vaping Use  ? Vaping Use: Every day  ? Substances: Nicotine, Flavoring  ?Substance and Sexual Activity  ? Alcohol use: No  ?  Comment: occassionally  ? Drug use: Not Currently  ? Sexual activity: Not Currently  ?Other Topics Concern  ? Not on file  ?Social History Narrative  ? Right Handed  ? Lives in a one story home  ? Drinks a big cup of coffee daily  ? ?Social Determinants of Health  ? ?Financial Resource Strain: Not on file  ?Food Insecurity: Not on file  ?Transportation Needs: Not on file  ?Physical Activity: Not on file  ?Stress: Not on file  ?Social Connections: Not on file  ?Intimate Partner Violence: Not on file  ? ? ?Past Medical History, Surgical history, Social history, and Family history were reviewed and updated as appropriate.  ? ?Please see review of systems for further details on the patient's review from today.  ? ?Objective:  ? ?Physical Exam:  ?LMP 01/29/2011  ? ?Physical Exam ?Constitutional:   ?   General: She is not in acute distress. ?Musculoskeletal:     ?   General: No deformity.  ?Neurological:  ?   Mental Status: She is alert and oriented to person, place, and time.  ?   Coordination: Coordination normal.  ?Psychiatric:     ?   Attention and Perception: Attention and perception normal. She does not perceive auditory or visual hallucinations.     ?   Mood and Affect: Mood normal. Mood is not anxious or depressed. Affect is  not labile, blunt, angry or inappropriate.     ?   Speech: Speech normal.     ?   Behavior: Behavior normal.     ?   Thought Content: Thought content normal. Thought content is not paranoid or delusional. Though

## 2021-04-30 NOTE — Progress Notes (Signed)
Patient was seen Friday afternoon and plan for neo chemo, chemoRT, and then surgery made. Patient will start treatment on 05/03/21. Initially Dr Marin Olp didn't feel like patient needed a port, however after she spoke to people who had received oxaliplatin in the past, she is requesting port. Scheduled for 05/02/21. Patient has also been scheduled for chemo ed.  ? ?Oncology Nurse Navigator Documentation ? ? ?  04/30/2021  ?  7:45 AM  ?Oncology Nurse Navigator Flowsheets  ?Planned Course of Treatment Neo Chemo  ?Phase of Treatment Chemo  ?Chemotherapy Pending- Reason: Authorization  ?Navigator Follow Up Date: 05/03/2021  ?Navigator Follow Up Reason: Chemotherapy  ?Navigator Location [facility]  ?Navigator Encounter Type Appt/Treatment Plan Review  ?Patient Visit Type MedOnc  ?Treatment Phase Pre-Tx/Tx Discussion  ?Barriers/Navigation Needs Coordination of Care;Education  ?Interventions None Required  ?Acuity Level 2-Minimal Needs (1-2 Barriers Identified)  ?Support Groups/Services Friends and Family  ?Time Spent with Patient 15  ?  ?

## 2021-05-01 ENCOUNTER — Other Ambulatory Visit: Payer: Self-pay | Admitting: Student

## 2021-05-01 ENCOUNTER — Other Ambulatory Visit (HOSPITAL_COMMUNITY): Payer: Self-pay

## 2021-05-01 ENCOUNTER — Inpatient Hospital Stay: Payer: BC Managed Care – PPO

## 2021-05-01 ENCOUNTER — Encounter: Payer: Self-pay | Admitting: *Deleted

## 2021-05-01 NOTE — Progress Notes (Unsigned)
Patient in chemotherapy education class with  wife Aimee.  Discussed side effects of      Oxaliplatin which include but are not limited to myelosuppression, decreased appetite, fatigue, fever, allergic or infusional reaction, mucositis, cardiac toxicity, cough, SOB, altered taste, nausea and vomiting, diarrhea, constipation, elevated LFTs myalgia and arthralgias, hair loss or thinning, rash, skin dryness, nail changes, peripheral neuropathy, discolored urine, delayed wound healing, mental changes (Chemo brain), increased risk of infections, weight loss.  Reviewed infusion room and office policy and procedure and phone numbers 24 hours x 7 days a week.  Reviewed when to call the office with any concerns or problems.  Scientist, clinical (histocompatibility and immunogenetics) given.  Discussed portacath insertion and EMLA cream administration.  Antiemetic protocol and chemotherapy schedule reviewed. Patient verbalized understanding of chemotherapy indications and possible side effects.  Teachback done  ?

## 2021-05-01 NOTE — Telephone Encounter (Signed)
Patient stated that the discount pricing for Capecitabine ($75) at [facility] was more affordable than the $100 copay through her insurance. Patient will pick up medication on Friday 05/03/21. ? ?Dennison Nancy CPHT ?Specialty Pharmacy Patient Advocate ?[place] ?Phone [phone] ?Fax [phone] ?05/01/2021 3:36 PM ? ?

## 2021-05-02 ENCOUNTER — Encounter (HOSPITAL_COMMUNITY): Payer: Self-pay

## 2021-05-02 ENCOUNTER — Ambulatory Visit (HOSPITAL_COMMUNITY)
Admission: RE | Admit: 2021-05-02 | Discharge: 2021-05-02 | Disposition: A | Discharge status: Home / Self Care | Payer: BC Managed Care – PPO | Source: Ambulatory Visit | Attending: Family | Admitting: Family

## 2021-05-02 ENCOUNTER — Ambulatory Visit (HOSPITAL_COMMUNITY)
Admission: RE | Admit: 2021-05-02 | Discharge: 2021-05-02 | Disposition: A | Discharge status: Home / Self Care | Payer: BC Managed Care – PPO | Source: Ambulatory Visit | Attending: Hematology & Oncology | Admitting: Hematology & Oncology

## 2021-05-02 ENCOUNTER — Other Ambulatory Visit: Payer: Self-pay

## 2021-05-02 ENCOUNTER — Other Ambulatory Visit (HOSPITAL_COMMUNITY): Payer: Self-pay

## 2021-05-02 DIAGNOSIS — E785 Hyperlipidemia, unspecified: Secondary | ICD-10-CM | POA: Diagnosis not present

## 2021-05-02 DIAGNOSIS — C20 Malignant neoplasm of rectum: Secondary | ICD-10-CM

## 2021-05-02 DIAGNOSIS — F32A Depression, unspecified: Secondary | ICD-10-CM | POA: Insufficient documentation

## 2021-05-02 DIAGNOSIS — F17200 Nicotine dependence, unspecified, uncomplicated: Secondary | ICD-10-CM | POA: Insufficient documentation

## 2021-05-02 DIAGNOSIS — G4730 Sleep apnea, unspecified: Secondary | ICD-10-CM | POA: Insufficient documentation

## 2021-05-02 DIAGNOSIS — I10 Essential (primary) hypertension: Secondary | ICD-10-CM | POA: Insufficient documentation

## 2021-05-02 DIAGNOSIS — K219 Gastro-esophageal reflux disease without esophagitis: Secondary | ICD-10-CM | POA: Insufficient documentation

## 2021-05-02 DIAGNOSIS — F419 Anxiety disorder, unspecified: Secondary | ICD-10-CM | POA: Insufficient documentation

## 2021-05-02 DIAGNOSIS — Z452 Encounter for adjustment and management of vascular access device: Secondary | ICD-10-CM | POA: Diagnosis not present

## 2021-05-02 HISTORY — PX: IR IMAGING GUIDED PORT INSERTION: IMG5740

## 2021-05-02 MED ORDER — FENTANYL CITRATE (PF) 100 MCG/2ML IJ SOLN
INTRAMUSCULAR | Status: AC | PRN
  Administered 2021-05-02 (×2): 50 ug via INTRAVENOUS

## 2021-05-02 MED ORDER — LIDOCAINE-EPINEPHRINE 1 %-1:100000 IJ SOLN
INTRAMUSCULAR | Status: AC
  Filled 2021-05-02: qty 1

## 2021-05-02 MED ORDER — MIDAZOLAM HCL 2 MG/2ML IJ SOLN
INTRAMUSCULAR | Status: AC
  Filled 2021-05-02: qty 4

## 2021-05-02 MED ORDER — LIDOCAINE-EPINEPHRINE 1 %-1:100000 IJ SOLN
INTRAMUSCULAR | Status: AC | PRN
  Administered 2021-05-02: 20 mL

## 2021-05-02 MED ORDER — FENTANYL CITRATE (PF) 100 MCG/2ML IJ SOLN
INTRAMUSCULAR | Status: AC
  Filled 2021-05-02: qty 2

## 2021-05-02 MED ORDER — SODIUM CHLORIDE 0.9 % IV SOLN
INTRAVENOUS | Status: DC
Start: 2021-05-02 — End: 2021-05-03

## 2021-05-02 MED ORDER — MIDAZOLAM HCL 2 MG/2ML IJ SOLN
INTRAMUSCULAR | Status: AC | PRN
  Administered 2021-05-02 (×2): 1 mg via INTRAVENOUS

## 2021-05-02 MED ORDER — LIDOCAINE HCL 1 % IJ SOLN
INTRAMUSCULAR | Status: AC
  Filled 2021-05-02: qty 20

## 2021-05-02 MED ORDER — HEPARIN SOD (PORK) LOCK FLUSH 100 UNIT/ML IV SOLN
INTRAVENOUS | Status: AC
Start: 2021-05-02 — End: 2021-05-02
  Filled 2021-05-02: qty 5

## 2021-05-02 NOTE — Consult Note (Signed)
? ?Chief Complaint: ?Patient was seen in consultation today for Port-A-Cath placement ? ?Referring Physician(s): ?Ennever,P ? ?Supervising Physician: Markus Daft ? ?Patient Status: [name] ? ?History of Present Illness: ?Janice Lucas is a 56 y.o. female smoker with PMH sig for GERD, HLD, anxiety/depression, HTN,sleep apnea and now with newly diagnosed rectal cancer. She is scheduled today for port a cath placement to assist with treatment. ? ?Past Medical History:  ?Diagnosis Date  ? Allergy   ? SEASONAL - MILD  ? GERD (gastroesophageal reflux disease)   ? ON OMEPRAZOLE  ? Goals of care, counseling/discussion 04/26/2021  ? Hyperlipidemia   ? ON MEDS  ? Hypertension   ? ON MEDS  ? Rectal cancer ([place]) 04/26/2021  ? Sleep apnea   ? WEARS CPAP  ? ? ?Past Surgical History:  ?Procedure Laterality Date  ? ANTERIOR CERVICAL DECOMP/DISCECTOMY FUSION  02/14/2011  ? Procedure: ANTERIOR CERVICAL DECOMPRESSION/DISCECTOMY FUSION 1 LEVEL;  Surgeon: Earleen Newport, MD;  Location: [place] [facility];  Service: Neurosurgery;  Laterality: N/A;  Anterior Cervical Six-Seven Decompression and Fusion  ? ? ?Allergies: ?Sulfa antibiotics and Neosporin  [neomycin-bacitracin zn-polymyx] ? ?Medications: ?Prior to Admission medications   ?Medication Sig Start Date End Date Taking? Authorizing Provider  ?acetaminophen (TYLENOL) 500 MG tablet Take 500 mg by mouth every 6 (six) hours as needed.   Yes [provider]  ?acetaZOLAMIDE ER (DIAMOX) 500 MG capsule Take 2 capsules (1,000 mg total) by mouth 2 (two) times daily. 10/01/20  Yes Cameron Sprang, MD  ?Ascorbic Acid (VITAMIN C) 1000 MG tablet Take 1,000 mg by mouth daily.   Yes [provider]  ?aspirin EC 81 MG tablet Take 81 mg by mouth daily.   Yes [provider]  ?atorvastatin (LIPITOR) 20 MG tablet Take 1 tablet (20 mg total) by mouth daily. 02/14/21  Yes Copland, Gay Filler, MD  ?Calcium Carb-Cholecalciferol (CALCIUM 1000 + D PO) Take by mouth 2 (two) times daily.    Yes [provider]  ?capecitabine (XELODA) 500 MG tablet Take 4 tablets (2,000 mg total) by mouth 2 (two) times daily after a meal. Take twice a day for 14 days on and 7 days off. 04/26/21  Yes Ennever, Rudell Cobb, MD  ?cholecalciferol (VITAMIN D3) 25 MCG (1000 UT) tablet Take 1,000 Units by mouth daily.   Yes [provider]  ?fluticasone (FLONASE) 50 MCG/ACT nasal spray Place 2 sprays into both nostrils daily. ?Patient taking differently: Place 2 sprays into both nostrils daily. As needed 02/11/21  Yes Copland, Gay Filler, MD  ?lisinopril-hydrochlorothiazide (ZESTORETIC) 20-25 MG tablet Take 1 tablet by mouth daily. 02/11/21  Yes Copland, Gay Filler, MD  ?MAGNESIUM PO Take by mouth.   Yes [provider]  ?nortriptyline (PAMELOR) 50 MG capsule TAKE 2 CAPSULES BY MOUTH EVERY NIGHT AS DIRECTED 10/01/20  Yes Cameron Sprang, MD  ?omeprazole (PRILOSEC) 20 MG capsule Take 1 capsule (20 mg total) by mouth daily. 02/11/21  Yes Copland, Gay Filler, MD  ?sertraline (ZOLOFT) 25 MG tablet Take 12.5 mg by mouth daily.   Yes [provider]  ?topiramate (TOPAMAX) 100 MG tablet Take 1 tablet (100 mg total) by mouth 2 (two) times daily. 10/01/20  Yes Cameron Sprang, MD  ?vitamin E 1000 UNIT capsule Take 1,000 Units by mouth daily.   Yes [provider]  ?albuterol (VENTOLIN HFA) 108 (90 Base) MCG/ACT inhaler Inhale 2 puffs into the lungs every 6 (six) hours as needed for wheezing or shortness  of breath. 11/30/19   Copland, Gay Filler, MD  ?azelastine (ASTELIN) 0.1 % nasal spray Place 2 sprays into both nostrils 2 (two) times daily. ?Patient taking differently: Place 2 sprays into both nostrils 2 (two) times daily. As needed 03/28/20   Colon Branch, MD  ?dexamethasone (DECADRON) 4 MG tablet Take 2 tablets (8 mg total) by mouth daily. Start the day after chemotherapy for 2 days. Take with food. 04/29/21   Volanda Napoleon, MD  ?lidocaine-prilocaine (EMLA) cream Apply to affected area once 04/29/21    Volanda Napoleon, MD  ?ondansetron (ZOFRAN) 8 MG tablet Take 1 tablet (8 mg total) by mouth 2 (two) times daily as needed for refractory nausea / vomiting. Start on day 3 after chemotherapy. 04/29/21   Volanda Napoleon, MD  ?prochlorperazine (COMPAZINE) 10 MG tablet Take 1 tablet (10 mg total) by mouth every 6 (six) hours as needed (Nausea or vomiting). 04/29/21   Volanda Napoleon, MD  ?  ? ?Family History  ?Problem Relation Age of Onset  ? Prostate cancer Father   ?     METS TO LYMPH NODES AND BLADDER  ? Diabetes Maternal Aunt   ? Diabetes Maternal Uncle   ? Colon cancer Neg Hx   ? Colon polyps Neg Hx   ? Esophageal cancer Neg Hx   ? Rectal cancer Neg Hx   ? Stomach cancer Neg Hx   ? ? ?Social History  ? ?Socioeconomic History  ? Marital status: Married  ?  Spouse name: Not on file  ? Number of children: Not on file  ? Years of education: Not on file  ? Highest education level: Not on file  ?Occupational History  ? Not on file  ?Tobacco Use  ? Smoking status: Former  ?  Packs/day: 0.50  ?  Types: Cigarettes  ? Smokeless tobacco: Current  ? Tobacco comments:  ?  VAPES   ?Vaping Use  ? Vaping Use: Every day  ? Substances: Nicotine, Flavoring  ?Substance and Sexual Activity  ? Alcohol use: No  ?  Comment: occassionally  ? Drug use: Not Currently  ? Sexual activity: Not Currently  ?Other Topics Concern  ? Not on file  ?Social History Narrative  ? Right Handed  ? Lives in a one story home  ? Drinks a big cup of coffee daily  ? ?Social Determinants of Health  ? ?Financial Resource Strain: Not on file  ?Food Insecurity: Not on file  ?Transportation Needs: Not on file  ?Physical Activity: Not on file  ?Stress: Not on file  ?Social Connections: Not on file  ? ? ? ? ?Review of Systems denies fever,HA,CP,dyspnea, cough, abd pain, back pain, N/V or bleeding ? ?Vital Signs: ?BP 112/74   Pulse 80   Temp 97.7 ?F (36.5 ?C) (Oral)   Resp 16   Ht '5\' 7"'$  (1.702 m)   Wt 300 lb (136.1 kg)   LMP 01/29/2011   SpO2 100%   BMI 46.99  kg/m?  ? ?Physical Exam awake, alert.  Chest with distant but clear breath sounds bilaterally.  Heart with regular rate and rhythm.  Abdomen obese, soft, positive bowel sounds, nontender.  No significant lower extremity edema. ? ?Imaging: ?CT Chest W Contrast ? ?Result Date: 04/11/2021 ?CLINICAL DATA:  Rectal cancer staging. * Tracking Code: BO * EXAM: CT CHEST WITH CONTRAST TECHNIQUE: Multidetector CT imaging of the chest was performed during intravenous contrast administration. RADIATION DOSE REDUCTION: This exam was performed according to the departmental  dose-optimization program which includes automated exposure control, adjustment of the mA and/or kV according to patient size and/or use of iterative reconstruction technique. CONTRAST:  74m OMNIPAQUE IOHEXOL 300 MG/ML  SOLN COMPARISON:  None. FINDINGS: Cardiovascular: Scattered aortic atherosclerosis without thoracic aortic aneurysm. No central pulmonary embolus on this nondedicated study. Normal size heart. No significant pericardial effusion/thickening. Mediastinum/Nodes: Hypodense 2.2 cm nodule in the left lobe of the thyroid. Recommend thyroid [place](ref: J Am Coll Radiol. 2015 Feb;12(2): 143-50).Prominent mediastinal, hilar and axillary lymph nodes, not pathologically enlarged by size criteria. Small hiatal hernia. Lungs/Pleura: Scattered subsegmental atelectasis. No suspicious pulmonary nodules or masses. No pleural effusion. No pneumothorax. Upper Abdomen: No acute abnormality. Musculoskeletal: Partially visualized cervical fusion hardware. No aggressive lytic or blastic lesion of bone. IMPRESSION: 1. No convincing evidence of metastatic disease in the chest. 2. Hypodense 2.2 cm nodule in the left lobe of the thyroid. Recommend thyroid [place] 3. Prominent mediastinal, hilar or axillary lymph nodes are favored reactive. 4. Aortic Atherosclerosis (ICD10-I70.0). Electronically Signed   By: JDahlia BailiffM.D.   On: 04/11/2021 14:31  ? ?CT Abdomen Pelvis W  Contrast ? ?Result Date: 04/17/2021 ?CLINICAL DATA:  Rectal cancer, staging; * Tracking Code: BO * EXAM: CT ABDOMEN AND PELVIS WITH CONTRAST TECHNIQUE: Multidetector CT imaging of the abdomen and pelvis was performed

## 2021-05-02 NOTE — Discharge Instructions (Addendum)
Please call Interventional Radiology clinic [phone] with any questions or concerns. ? ?You may remove your dressing and shower tomorrow. Do not submerge in water until site is healed. ? ?DO NOT use EMLA cream (or other creams / ointments) on your port site for 2 weeks (or until site has healed) as this cream will remove surgical glue on your incision. ? ?Your port should be flushed monthly. ? ?Implanted Port Insertion, Care After ?This sheet gives you information about how to care for yourself after your procedure. Your health care provider may also give you more specific instructions. If you have problems or questions, contact your health care provider. ?What can I expect after the procedure? ?After the procedure, it is common to have: ?Discomfort at the port insertion site. ?Bruising on the skin over the port. This should improve over 3-4 days. ?Follow these instructions at home: ?Port care ?After your port is placed, you will get a manufacturer's information card. The card has information about your port. Keep this card with you at all times. ?Take care of the port as told by your health care provider. Ask your health care provider if you or a family member can get training for taking care of the port at home. A home health care nurse may also take care of the port. ?Make sure to remember what type of port you have. ?Incision care ?Follow instructions from your health care provider about how to take care of your port insertion site. Make sure you: ?Wash your hands with soap and water before and after you change your bandage (dressing). If soap and water are not available, use hand sanitizer. ?Change your dressing as told by your health care provider. ?Leave stitches (sutures), skin glue, or adhesive strips in place. These skin closures may need to stay in place for 2 weeks or longer. If adhesive strip edges start to loosen and curl up, you may trim the loose edges. Do not remove adhesive strips completely  unless your health care provider tells you to do that. ?Check your port insertion site every day for signs of infection. Check for: ?Redness, swelling, or pain. ?Fluid or blood. ?Warmth. ?Pus or a bad smell.  ? ?  ? ?  ?Activity ?Return to your normal activities as told by your health care provider. Ask your health care provider what activities are safe for you. ?Do not lift anything that is heavier than 10 lb (4.5 kg), or the limit that you are told, until your health care provider says that it is safe. ?General instructions ?Take over-the-counter and prescription medicines only as told by your health care provider. ?Do not take baths, swim, or use a hot tub until your health care provider approves. Ask your health care provider if you may take showers. You may only be allowed to take sponge baths. ?Do not drive for 24 hours if you were given a sedative during your procedure. ?Wear a medical alert bracelet in case of an emergency. This will tell any health care providers that you have a port. ?Keep all follow-up visits as told by your health care provider. This is important. ?Contact a health care provider if: ?You cannot flush your port with saline as directed, or you cannot draw blood from the port. ?You have a fever or chills. ?You have redness, swelling, or pain around your port insertion site. ?You have fluid or blood coming from your port insertion site. ?Your port insertion site feels warm to the touch. ?You have pus or  a bad smell coming from the port insertion site. ?Get help right away if: ?You have chest pain or shortness of breath. ?You have bleeding from your port that you cannot control. ?Summary ?Take care of the port as told by your health care provider. Keep the manufacturer's information card with you at all times. ?Change your dressing as told by your health care provider. ?Contact a health care provider if you have a fever or chills or if you have redness, swelling, or pain around your port  insertion site. ?Keep all follow-up visits as told by your health care provider. ?This information is not intended to replace advice given to you by your health care provider. Make sure you discuss any questions you have with your health care provider. ?Document Revised: 07/28/2017 Document Reviewed: 07/28/2017 ?Elsevier Patient Education ? [place]. ? ? ?Moderate Conscious Sedation, Adult, Care After ?This sheet gives you information about how to care for yourself after your procedure. Your health care provider may also give you more specific instructions. If you have problems or questions, contact your health care provider. ?What can I expect after the procedure? ?After the procedure, it is common to have: ?Sleepiness for several hours. ?Impaired judgment for several hours. ?Difficulty with balance. ?Vomiting if you eat too soon. ?Follow these instructions at home: ?For the time period you were told by your health care provider: ?Rest. ?Do not participate in activities where you could fall or become injured. ?Do not drive or use machinery. ?Do not drink alcohol. ?Do not take sleeping pills or medicines that cause drowsiness. ?Do not make important decisions or sign legal documents. ?Do not take care of children on your own.  ? ?  ? ?  ?Eating and drinking ?Follow the diet recommended by your health care provider. ?Drink enough fluid to keep your urine pale yellow. ?If you vomit: ?Drink water, juice, or soup when you can drink without vomiting. ?Make sure you have little or no nausea before eating solid foods.  ?  ?General instructions ?Take over-the-counter and prescription medicines only as told by your health care provider. ?Have a responsible adult stay with you for the time you are told. It is important to have someone help care for you until you are awake and alert. ?Do not smoke. ?Keep all follow-up visits as told by your health care provider. This is important. ?Contact a health care provider  if: ?You are still sleepy or having trouble with balance after 24 hours. ?You feel light-headed. ?You keep feeling nauseous or you keep vomiting. ?You develop a rash. ?You have a fever. ?You have redness or swelling around the IV site. ?Get help right away if: ?You have trouble breathing. ?You have new-onset confusion at home. ?Summary ?After the procedure, it is common to feel sleepy, have impaired judgment, or feel nauseous if you eat too soon. ?Rest after you get home. Know the things you should not do after the procedure. ?Follow the diet recommended by your health care provider and drink enough fluid to keep your urine pale yellow. ?Get help right away if you have trouble breathing or new-onset confusion at home. ?This information is not intended to replace advice given to you by your health care provider. Make sure you discuss any questions you have with your health care provider. ?Document Revised: 04/29/2019 Document Reviewed: 11/25/2018 ?Elsevier Patient Education ? [place].  ?

## 2021-05-02 NOTE — Procedures (Signed)
Interventional Radiology Procedure:   Indications: Rectal cancer  Procedure: Port placement  Findings: Right jugular port, tip at SVC/RA junction  Complications: None     EBL: Minimal, less than 10 ml  Plan: Discharge in one hour.  Keep port site and incisions dry for at least 24 hours.     Jeremias Broyhill R. Kaimana Lurz, MD  Pager: [phone]   

## 2021-05-03 ENCOUNTER — Inpatient Hospital Stay: Payer: BC Managed Care – PPO

## 2021-05-03 ENCOUNTER — Encounter: Payer: Self-pay | Admitting: *Deleted

## 2021-05-03 VITALS — BP 126/76 | HR 98 | Temp 97.9°F | Resp 18

## 2021-05-03 DIAGNOSIS — Z87891 Personal history of nicotine dependence: Secondary | ICD-10-CM | POA: Diagnosis not present

## 2021-05-03 DIAGNOSIS — C20 Malignant neoplasm of rectum: Secondary | ICD-10-CM

## 2021-05-03 DIAGNOSIS — Z5111 Encounter for antineoplastic chemotherapy: Secondary | ICD-10-CM | POA: Diagnosis not present

## 2021-05-03 LAB — CMP (CANCER CENTER ONLY)
ALT: 15 U/L (ref 0–44)
AST: 14 U/L — ABNORMAL LOW (ref 15–41)
Albumin: 4.2 g/dL (ref 3.5–5.0)
Alkaline Phosphatase: 76 U/L (ref 38–126)
Anion gap: 9 (ref 5–15)
BUN: 17 mg/dL (ref 6–20)
CO2: 25 mmol/L (ref 22–32)
Calcium: 9.1 mg/dL (ref 8.9–10.3)
Chloride: 103 mmol/L (ref 98–111)
Creatinine: 0.91 mg/dL (ref 0.44–1.00)
GFR, Estimated: >60 mL/min (ref >60)
Glucose, Bld: 119 mg/dL — ABNORMAL HIGH (ref 70–99)
Potassium: 3 mmol/L — ABNORMAL LOW (ref 3.5–5.1)
Sodium: 137 mmol/L (ref 135–145)
Total Bilirubin: 0.3 mg/dL (ref 0.3–1.2)
Total Protein: 7.6 g/dL (ref 6.5–8.1)

## 2021-05-03 LAB — CBC WITH DIFFERENTIAL (CANCER CENTER ONLY)
Abs Immature Granulocytes: 0.03 10*3/uL (ref 0.00–0.07)
Basophils Absolute: 0 10*3/uL (ref 0.0–0.1)
Basophils Relative: 0 %
Eosinophils Absolute: 0.4 10*3/uL (ref 0.0–0.5)
Eosinophils Relative: 4 %
HCT: 40 % (ref 36.0–46.0)
Hemoglobin: 13.3 g/dL (ref 12.0–15.0)
Immature Granulocytes: 0 %
Lymphocytes Relative: 20 %
Lymphs Abs: 1.9 10*3/uL (ref 0.7–4.0)
MCH: 28.9 pg (ref 26.0–34.0)
MCHC: 33.3 g/dL (ref 30.0–36.0)
MCV: 86.8 fL (ref 80.0–100.0)
Monocytes Absolute: 0.6 10*3/uL (ref 0.1–1.0)
Monocytes Relative: 6 %
Neutro Abs: 6.4 10*3/uL (ref 1.7–7.7)
Neutrophils Relative %: 70 %
Platelet Count: 280 10*3/uL (ref 150–400)
RBC: 4.61 MIL/uL (ref 3.87–5.11)
RDW: 13.7 % (ref 11.5–15.5)
WBC Count: 9.3 10*3/uL (ref 4.0–10.5)
nRBC: 0 % (ref 0.0–0.2)

## 2021-05-03 MED ORDER — HEPARIN SOD (PORK) LOCK FLUSH 100 UNIT/ML IV SOLN
500.0000 [IU] | Freq: Once | INTRAVENOUS | Status: AC | PRN
  Administered 2021-05-03: 500 [IU]

## 2021-05-03 MED ORDER — PALONOSETRON HCL INJECTION 0.25 MG/5ML
0.2500 mg | Freq: Once | INTRAVENOUS | Status: AC
  Administered 2021-05-03: 0.25 mg via INTRAVENOUS
  Filled 2021-05-03: qty 5

## 2021-05-03 MED ORDER — SODIUM CHLORIDE 0.9% FLUSH
10.0000 mL | INTRAVENOUS | Status: DC | PRN
  Administered 2021-05-03: 10 mL

## 2021-05-03 MED ORDER — DEXTROSE 5 % IV SOLN: Freq: Once | INTRAVENOUS | Status: AC

## 2021-05-03 MED ORDER — SODIUM CHLORIDE 0.9 % IV SOLN
10.0000 mg | Freq: Once | INTRAVENOUS | Status: AC
  Administered 2021-05-03: 10 mg via INTRAVENOUS
  Filled 2021-05-03: qty 10

## 2021-05-03 MED ORDER — OXALIPLATIN CHEMO INJECTION 100 MG/20ML
130.0000 mg/m2 | Freq: Once | INTRAVENOUS | Status: AC
  Administered 2021-05-03: 330 mg via INTRAVENOUS
  Filled 2021-05-03: qty 66

## 2021-05-03 NOTE — Patient Instructions (Signed)

## 2021-05-03 NOTE — Progress Notes (Signed)
Patient is here to begin chemo. She had her port placed yesterday; she says this went well. She is nervous but ready to start her treatment. She had chemo ed on Wednesday and at this time has no questions.  ? ?Patient knows that there is an oncall service over the weekend that will cover the calls, so that if she has questions or immediate needs she can call. I will reach out to her on Monday to see how she is feeling.  ? ?Oncology Nurse Navigator Documentation ? ? ?  05/03/2021  ?  1:45 PM  ?Oncology Nurse Navigator Flowsheets  ?Phase of Treatment Chemo  ?Chemotherapy Actual Start Date: 05/03/2021  ?Navigator Follow Up Date: 05/06/2021  ?Navigator Follow Up Reason: Patient Call  ?Navigator Location [facility]  ?Navigator Encounter Type Treatment  ?Treatment Initiated Date 05/03/2021  ?Patient Visit Type MedOnc  ?Treatment Phase First Chemo Tx  ?Barriers/Navigation Needs Coordination of Care;Education  ?Education Other  ?Interventions Education;Psycho-Social Support  ?Acuity Level 2-Minimal Needs (1-2 Barriers Identified)  ?Education Method Verbal  ?Support Groups/Services Friends and Family  ?Time Spent with Patient 30  ?  ?

## 2021-05-03 NOTE — Progress Notes (Signed)
Okay to treat today with labs from 04/09/2021 per MD.  ?

## 2021-05-03 NOTE — Patient Instructions (Signed)
[facility]  Discharge Instructions: ?Thank you for choosing [facility] to provide your oncology and hematology care.  ? ?If you have a lab appointment with the [facility], please go directly to the [facility] and check in at the registration area. ? ?Wear comfortable clothing and clothing appropriate for easy access to any Portacath or PICC line.  ? ?We strive to give you quality time with your provider. You may need to reschedule your appointment if you arrive late (15 or more minutes).  Arriving late affects you and other patients whose appointments are after yours.  Also, if you miss three or more appointments without notifying the office, you may be dismissed from the clinic at the provider?s discretion.    ?  ?For prescription refill requests, have your pharmacy contact our office and allow 72 hours for refills to be completed.   ? ?Today you received the following chemotherapy and/or immunotherapy agents Oxaliplatin    ?  ?To help prevent nausea and vomiting after your treatment, we encourage you to take your nausea medication as directed. ? ?BELOW ARE SYMPTOMS THAT SHOULD BE REPORTED IMMEDIATELY: ?*FEVER GREATER THAN 100.4 F (38 ?C) OR HIGHER ?*CHILLS OR SWEATING ?*NAUSEA AND VOMITING THAT IS NOT CONTROLLED WITH YOUR NAUSEA MEDICATION ?*UNUSUAL SHORTNESS OF BREATH ?*UNUSUAL BRUISING OR BLEEDING ?*URINARY PROBLEMS (pain or burning when urinating, or frequent urination) ?*BOWEL PROBLEMS (unusual diarrhea, constipation, pain near the anus) ?TENDERNESS IN MOUTH AND THROAT WITH OR WITHOUT PRESENCE OF ULCERS (sore throat, sores in mouth, or a toothache) ?UNUSUAL RASH, SWELLING OR PAIN  ?UNUSUAL VAGINAL DISCHARGE OR ITCHING  ? ?Items with * indicate a potential emergency and should be followed up as soon as possible or go to the Emergency Department if any problems should occur. ? ?Please show the CHEMOTHERAPY ALERT CARD or IMMUNOTHERAPY ALERT CARD at check-in to the  Emergency Department and triage nurse. ?Should you have questions after your visit or need to cancel or reschedule your appointment, please contact [facility]  [phone] and follow the prompts.  Office hours are 8:00 a.m. to 4:30 p.m. Monday - Friday. Please note that voicemails left after 4:00 p.m. may not be returned until the following business day.  We are closed weekends and major holidays. You have access to a nurse at all times for urgent questions. Please call the main number to the clinic [phone] and follow the prompts. ? ?For any non-urgent questions, you may also contact your provider using MyChart. We now offer e-Visits for anyone 76 and older to request care online for non-urgent symptoms. For details visit [email or web address]. ?  ?Also download the MyChart app! Go to the app store, search "MyChart", open the app, select [facility], and log in with your MyChart username and password. ? ?Due to Covid, a mask is required upon entering the hospital/clinic. If you do not have a mask, one will be given to you upon arrival. For doctor visits, patients may have 1 support person aged 70 or older with them. For treatment visits, patients cannot have anyone with them due to current Covid guidelines and our immunocompromised population.  ?

## 2021-05-06 ENCOUNTER — Encounter: Payer: Self-pay | Admitting: *Deleted

## 2021-05-06 NOTE — Progress Notes (Signed)
Patient is doing well this morning and it at work. She states that over the weekend she experienced some hot flashes, and some tingling in her hands, but nothing significant. She also reported some diarrhea which was controlled with a dose of imodium. This morning she had some mild nausea and took one of her antiemetics. She feels like overall she's doing well. ? ?Reviewed the treatment of nausea and how she should treat symptoms early. Also advised her that antiemetics can cause constipation so to be careful about using imodium unless necessary while taking the antiemetic. Also encouraged her to call the office with any questions or concerns. She agreed.  ? ?Oncology Nurse Navigator Documentation ? ? ?  05/06/2021  ? 10:00 AM  ?Oncology Nurse Navigator Flowsheets  ?Navigator Follow Up Date: 05/24/2021  ?Navigator Follow Up Reason: Follow-up Appointment;Chemotherapy  ?Navigator Location [facility]  ?Navigator Encounter Type Telephone  ?Telephone Outgoing Call;Patient Update  ?Patient Visit Type MedOnc  ?Treatment Phase Active Tx  ?Barriers/Navigation Needs Coordination of Care;Education  ?Education Pain/ Symptom Management  ?Interventions Education;Psycho-Social Support  ?Acuity Level 2-Minimal Needs (1-2 Barriers Identified)  ?Education Method Verbal  ?Support Groups/Services Friends and Family  ?Time Spent with Patient 30  ?  ?

## 2021-05-08 ENCOUNTER — Telehealth: Payer: Self-pay | Admitting: *Deleted

## 2021-05-08 ENCOUNTER — Other Ambulatory Visit: Payer: Self-pay | Admitting: *Deleted

## 2021-05-08 MED ORDER — DICYCLOMINE HCL 10 MG PO CAPS: 10.0000 mg | ORAL_CAPSULE | Freq: Three times a day (TID) | ORAL | 1 refills | Status: DC

## 2021-05-08 NOTE — Telephone Encounter (Signed)
Received a call from patient stating that she has had stomach pain since Monday to the point that she had to come home from work.  One small bout of diarrhea but Imodium stopped it.  Nausea well controlled by nausea medication.  Pain is constant.  Dr Marin Olp notified and ordered Bentyl po.  RX sent to pharmacy ?

## 2021-05-13 ENCOUNTER — Other Ambulatory Visit (HOSPITAL_COMMUNITY): Payer: Self-pay

## 2021-05-13 ENCOUNTER — Telehealth: Payer: Self-pay | Admitting: *Deleted

## 2021-05-13 ENCOUNTER — Other Ambulatory Visit: Payer: Self-pay | Admitting: *Deleted

## 2021-05-13 DIAGNOSIS — C20 Malignant neoplasm of rectum: Secondary | ICD-10-CM

## 2021-05-13 MED ORDER — TRAMADOL HCL 50 MG PO TABS: 50.0000 mg | ORAL_TABLET | Freq: Three times a day (TID) | ORAL | 1 refills | Status: DC | PRN

## 2021-05-13 MED ORDER — SUCRALFATE 1 GM/10ML PO SUSP: 1.0000 g | Freq: Four times a day (QID) | ORAL | 1 refills | Status: DC

## 2021-05-13 NOTE — Telephone Encounter (Signed)
Aimee called and stated,"Janice Lucas is still having abdominal pain. The Bentyl is making her sleep, but it doesn't take the pain away. She has nothing for pain. She is taking Tylenol Arthritis for pain. No nausea or vomiting. Some diarrhea at times." Per Dr. Marin Olp, he will prescribe Carafate elixir and Tramadol. Hold off on the Tylenol. Prescriptions E-scribed to [facility]. She verbalized understanding. ?

## 2021-05-14 ENCOUNTER — Telehealth: Payer: Self-pay

## 2021-05-14 NOTE — Telephone Encounter (Signed)
Pt called in stating that she is following up from yesterday. Her abdominal pain is better. She did vomit one time after dinner last night but believes that was due to the food. Pt is concerned because she is on pain medicine and has not had a BM in 2-3 days. Pt would like to know what Dr Marin Olp suggest to use for constipation.  ?Per Dr Marin Olp use Miralax BID until stools are liquid then change to using one a day.  ?Pt advised and states understanding.  ?

## 2021-05-18 ENCOUNTER — Other Ambulatory Visit (HOSPITAL_COMMUNITY): Payer: Self-pay

## 2021-05-20 ENCOUNTER — Other Ambulatory Visit (HOSPITAL_COMMUNITY): Payer: Self-pay

## 2021-05-21 ENCOUNTER — Inpatient Hospital Stay: Payer: BC Managed Care – PPO | Admitting: Hematology & Oncology

## 2021-05-21 ENCOUNTER — Encounter: Payer: Self-pay | Admitting: Hematology & Oncology

## 2021-05-21 ENCOUNTER — Inpatient Hospital Stay: Payer: BC Managed Care – PPO

## 2021-05-21 ENCOUNTER — Other Ambulatory Visit: Payer: Self-pay

## 2021-05-21 ENCOUNTER — Telehealth: Payer: Self-pay

## 2021-05-21 ENCOUNTER — Inpatient Hospital Stay: Payer: BC Managed Care – PPO | Attending: Hematology & Oncology

## 2021-05-21 VITALS — BP 105/80 | HR 101 | Temp 98.3°F | Resp 18 | Ht 67.0 in | Wt 285.8 lb

## 2021-05-21 DIAGNOSIS — C20 Malignant neoplasm of rectum: Secondary | ICD-10-CM | POA: Insufficient documentation

## 2021-05-21 DIAGNOSIS — K30 Functional dyspepsia: Secondary | ICD-10-CM

## 2021-05-21 DIAGNOSIS — R634 Abnormal weight loss: Secondary | ICD-10-CM | POA: Diagnosis not present

## 2021-05-21 DIAGNOSIS — Z87891 Personal history of nicotine dependence: Secondary | ICD-10-CM | POA: Insufficient documentation

## 2021-05-21 DIAGNOSIS — E876 Hypokalemia: Secondary | ICD-10-CM | POA: Diagnosis not present

## 2021-05-21 DIAGNOSIS — Z5111 Encounter for antineoplastic chemotherapy: Secondary | ICD-10-CM | POA: Diagnosis not present

## 2021-05-21 LAB — CMP (CANCER CENTER ONLY)
ALT: 46 U/L — ABNORMAL HIGH (ref 0–44)
AST: 32 U/L (ref 15–41)
Albumin: 3.6 g/dL (ref 3.5–5.0)
Alkaline Phosphatase: 64 U/L (ref 38–126)
Anion gap: 11 (ref 5–15)
BUN: 14 mg/dL (ref 6–20)
CO2: 27 mmol/L (ref 22–32)
Calcium: 8.3 mg/dL — ABNORMAL LOW (ref 8.9–10.3)
Chloride: 94 mmol/L — ABNORMAL LOW (ref 98–111)
Creatinine: 0.75 mg/dL (ref 0.44–1.00)
GFR, Estimated: >60 mL/min (ref >60)
Glucose, Bld: 142 mg/dL — ABNORMAL HIGH (ref 70–99)
Potassium: 2.1 mmol/L — CL (ref 3.5–5.1)
Sodium: 132 mmol/L — ABNORMAL LOW (ref 135–145)
Total Bilirubin: 0.4 mg/dL (ref 0.3–1.2)
Total Protein: 5.8 g/dL — ABNORMAL LOW (ref 6.5–8.1)

## 2021-05-21 LAB — CBC WITH DIFFERENTIAL (CANCER CENTER ONLY)
Abs Immature Granulocytes: 0.03 10*3/uL (ref 0.00–0.07)
Basophils Absolute: 0.1 10*3/uL (ref 0.0–0.1)
Basophils Relative: 0 %
Eosinophils Absolute: 0.7 10*3/uL — ABNORMAL HIGH (ref 0.0–0.5)
Eosinophils Relative: 6 %
HCT: 40.6 % (ref 36.0–46.0)
Hemoglobin: 14.3 g/dL (ref 12.0–15.0)
Immature Granulocytes: 0 %
Lymphocytes Relative: 17 %
Lymphs Abs: 2 10*3/uL (ref 0.7–4.0)
MCH: 28.6 pg (ref 26.0–34.0)
MCHC: 35.2 g/dL (ref 30.0–36.0)
MCV: 81.2 fL (ref 80.0–100.0)
Monocytes Absolute: 1.1 10*3/uL — ABNORMAL HIGH (ref 0.1–1.0)
Monocytes Relative: 9 %
Neutro Abs: 7.8 10*3/uL — ABNORMAL HIGH (ref 1.7–7.7)
Neutrophils Relative %: 68 %
Platelet Count: 320 10*3/uL (ref 150–400)
RBC: 5 MIL/uL (ref 3.87–5.11)
RDW: 13.7 % (ref 11.5–15.5)
WBC Count: 11.6 10*3/uL — ABNORMAL HIGH (ref 4.0–10.5)
nRBC: 0 % (ref 0.0–0.2)

## 2021-05-21 LAB — LACTATE DEHYDROGENASE: LDH: 214 U/L — ABNORMAL HIGH (ref 98–192)

## 2021-05-21 MED ORDER — POTASSIUM CHLORIDE 10 MEQ/100ML IV SOLN: 10.0000 meq | INTRAVENOUS | Status: DC

## 2021-05-21 MED ORDER — HEPARIN SOD (PORK) LOCK FLUSH 100 UNIT/ML IV SOLN
500.0000 [IU] | Freq: Once | INTRAVENOUS | Status: AC
  Administered 2021-05-21: 500 [IU] via INTRAVENOUS

## 2021-05-21 MED ORDER — PANTOPRAZOLE SODIUM 40 MG IV SOLR: 40.0000 mg | Freq: Once | INTRAVENOUS | Status: DC

## 2021-05-21 MED ORDER — SODIUM CHLORIDE 0.9% IV SOLUTION
INTRAVENOUS | Status: DC | PRN
  Filled 2021-05-21: qty 500

## 2021-05-21 MED ORDER — SODIUM CHLORIDE 0.9 % IV SOLN: INTRAVENOUS | Status: AC

## 2021-05-21 MED ORDER — SODIUM CHLORIDE 0.9% FLUSH: 10.0000 mL | Freq: Once | INTRAVENOUS | Status: DC

## 2021-05-21 MED ORDER — FAMOTIDINE IN NACL 20-0.9 MG/50ML-% IV SOLN: 20.0000 mg | Freq: Once | INTRAVENOUS | Status: DC

## 2021-05-21 NOTE — Telephone Encounter (Signed)
Critical potassium of 2.1 received from lab. MD aware ?

## 2021-05-21 NOTE — Progress Notes (Signed)
?Hematology and Oncology Follow Up Visit ? ?Janice Lucas ?[id number] ?[birth year] 56 y.o. ?05/21/2021 ? ? 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 ?Current Therapy:   ?Neoadjuvant Xeloda/oxaliplatin --start on 05/03/2021 ?    ?Interim History:  [name] is back for an early visit.  Unfortunate, she really has had a incredibly hard time with treatment.  I think is the Xeloda that is causing her all the problems.  She is not having diarrhea.  She did have an a lot of abdominal pain.  She is lost 15 pounds.  She has had some mouth sores. ? ?I worry about the possibility of  DPD deficiency.  I would have to send off a analysis for this. ? ?Her potassium is only 2.1.  I am going to  have to give her back IV potassium.  I am not sure that she is able to manage oral potassium. ? ?She has no neuropathy.  There is been no skin breakdown.  Her palms of the hands are little bit red and dry. ? ?I just am surprised by this.  I really never thought that she would have had an issue with treatment.  However, this is certainly a possibility. ? ?She has had no bleeding.  She has had no fever.  There has been no cough or shortness of breath. ? ?She just has very little quality of life right now. ? ?I suppose that if we were not able to treat her, we just may have to go straight to radiation therapy. ? ?Currently, I would say performance status is probably ECOG 1. ?   ? ?Medications:  ?Current Outpatient Medications:  ?  acetaminophen (TYLENOL) 500 MG tablet, Take 500 mg by mouth every 6 (six) hours as needed., Disp: , Rfl:  ?  acetaZOLAMIDE ER (DIAMOX) 500 MG capsule, Take 2 capsules (1,000 mg total) by mouth 2 (two) times daily., Disp: 360 capsule, Rfl: 3 ?  Ascorbic Acid (VITAMIN C) 1000 MG tablet, Take 1,000 mg by mouth daily., Disp: , Rfl:  ?  aspirin EC 81 MG tablet, Take 81 mg by mouth daily., Disp: , Rfl:  ?  atorvastatin (LIPITOR) 20 MG tablet, Take 1 tablet (20 mg total) by mouth daily.,  Disp: 90 tablet, Rfl: 3 ?  Calcium Carb-Cholecalciferol (CALCIUM 1000 + D PO), Take by mouth 2 (two) times daily., Disp: , Rfl:  ?  capecitabine (XELODA) 500 MG tablet, Take 4 tablets (2,000 mg total) by mouth 2 (two) times daily after a meal. Take twice a day for 14 days on and 7 days off., Disp: 112 tablet, Rfl: 3 ?  cholecalciferol (VITAMIN D3) 25 MCG (1000 UT) tablet, Take 1,000 Units by mouth daily., Disp: , Rfl:  ?  dexamethasone (DECADRON) 4 MG tablet, Take 2 tablets (8 mg total) by mouth daily. Start the day after chemotherapy for 2 days. Take with food., Disp: 30 tablet, Rfl: 1 ?  dicyclomine (BENTYL) 10 MG capsule, Take 1 capsule (10 mg total) by mouth 4 (four) times daily -  before meals and at bedtime., Disp: 90 capsule, Rfl: 1 ?  fluticasone (FLONASE) 50 MCG/ACT nasal spray, Place 2 sprays into both nostrils daily. (Patient taking differently: Place 2 sprays into both nostrils daily. As needed), Disp: 16 g, Rfl: 6 ?  lidocaine-prilocaine (EMLA) cream, Apply to affected area once, Disp: 30 g, Rfl: 3 ?  lisinopril-hydrochlorothiazide (ZESTORETIC) 20-25 MG tablet, Take 1 tablet by mouth daily., Disp: 90 tablet, Rfl: 3 ?  MAGNESIUM PO, Take by mouth., Disp: , Rfl:  ?  nortriptyline (PAMELOR) 50 MG capsule, TAKE 2 CAPSULES BY MOUTH EVERY NIGHT AS DIRECTED, Disp: 180 capsule, Rfl: 3 ?  omeprazole (PRILOSEC) 20 MG capsule, Take 1 capsule (20 mg total) by mouth daily., Disp: 90 capsule, Rfl: 3 ?  ondansetron (ZOFRAN) 8 MG tablet, Take 1 tablet (8 mg total) by mouth 2 (two) times daily as needed for refractory nausea / vomiting. Start on day 3 after chemotherapy., Disp: 30 tablet, Rfl: 1 ?  prochlorperazine (COMPAZINE) 10 MG tablet, Take 1 tablet (10 mg total) by mouth every 6 (six) hours as needed (Nausea or vomiting)., Disp: 30 tablet, Rfl: 1 ?  sertraline (ZOLOFT) 25 MG tablet, Take 12.5 mg by mouth daily., Disp: , Rfl:  ?  sucralfate (CARAFATE) 1 GM/10ML suspension, Take 10 mLs (1 g total) by mouth 4 (four)  times daily., Disp: 420 mL, Rfl: 1 ?  topiramate (TOPAMAX) 100 MG tablet, Take 1 tablet (100 mg total) by mouth 2 (two) times daily., Disp: 180 tablet, Rfl: 3 ?  traMADol (ULTRAM) 50 MG tablet, Take 1 tablet (50 mg total) by mouth every 8 (eight) hours as needed., Disp: 30 tablet, Rfl: 1 ?  vitamin E 1000 UNIT capsule, Take 1,000 Units by mouth daily., Disp: , Rfl:  ?  albuterol (VENTOLIN HFA) 108 (90 Base) MCG/ACT inhaler, Inhale 2 puffs into the lungs every 6 (six) hours as needed for wheezing or shortness of breath. (Patient not taking: Reported on 05/21/2021), Disp: 1 each, Rfl: 0 ?  azelastine (ASTELIN) 0.1 % nasal spray, Place 2 sprays into both nostrils 2 (two) times daily. (Patient not taking: Reported on 05/21/2021), Disp: 30 mL, Rfl: 1 ? ?Current Facility-Administered Medications:  ?  0.9 %  sodium chloride infusion, , Intravenous, Continuous, Hilman Kissling, Rudell Cobb, MD, Last Rate: 500 mL/hr at 05/21/21 1240, New Bag at 05/21/21 1240 ?  sodium chloride 0.9 % 500 mL with potassium chloride 40 mEq, , Intravenous, PRN, Volanda Napoleon, MD, Last Rate: 130 mL/hr at 05/21/21 1307, New Bag at 05/21/21 1307 ? ?Facility-Administered Medications Ordered in Other Visits:  ?  famotidine (PEPCID) IVPB 20 mg premix, 20 mg, Intravenous, Once, Honora Searson, Rudell Cobb, MD ? ?Allergies:  ?Allergies  ?Allergen Reactions  ? Sulfa Antibiotics Other (See Comments)  ?  Acts "goofy"  ? Neosporin  [Neomycin-Bacitracin Zn-Polymyx] Rash  ? ? ?Past Medical History, Surgical history, Social history, and Family History were reviewed and updated. ? ?Review of Systems: ?Review of Systems  ?Constitutional: Negative.   ?HENT:  Negative.    ?Eyes: Negative.   ?Respiratory: Negative.    ?Cardiovascular: Negative.   ?Gastrointestinal: Negative.   ?Endocrine: Negative.   ?Genitourinary: Negative.    ?Musculoskeletal: Negative.   ?Skin: Negative.   ?Neurological: Negative.   ?Hematological: Negative.   ?Psychiatric/Behavioral: Negative.    ? ?Physical Exam: ?  height is '5\' 7"'$  (1.702 m) and weight is 285 lb 12 oz (129.6 kg). Her oral temperature is 98.3 ?F (36.8 ?C). Her blood pressure is 105/80 and her pulse is 101 (abnormal). Her respiration is 18 and oxygen saturation is 99%.  ? ?Wt Readings from Last 3 Encounters:  ?05/21/21 285 lb 12 oz (129.6 kg)  ?05/02/21 300 lb (136.1 kg)  ?04/26/21 300 lb (136.1 kg)  ? ? ?Physical Exam ?Vitals reviewed.  ?HENT:  ?   Head: Normocephalic and atraumatic.  ?Eyes:  ?   Pupils: Pupils are equal, round, and reactive to light.  ?Cardiovascular:  ?  Rate and Rhythm: Normal rate and regular rhythm.  ?   Heart sounds: Normal heart sounds.  ?Pulmonary:  ?   Effort: Pulmonary effort is normal.  ?   Breath sounds: Normal breath sounds.  ?Abdominal:  ?   General: Bowel sounds are normal.  ?   Palpations: Abdomen is soft.  ?Musculoskeletal:     ?   General: No tenderness or deformity. Normal range of motion.  ?   Cervical back: Normal range of motion.  ?Lymphadenopathy:  ?   Cervical: No cervical adenopathy.  ?Skin: ?   General: Skin is warm and dry.  ?   Findings: No erythema or rash.  ?Neurological:  ?   Mental Status: She is alert and oriented to person, place, and time.  ?Psychiatric:     ?   Behavior: Behavior normal.     ?   Thought Content: Thought content normal.     ?   Judgment: Judgment normal.  ? ? ? ?Lab Results  ?Component Value Date  ? WBC 11.6 (H) 05/21/2021  ? HGB 14.3 05/21/2021  ? HCT 40.6 05/21/2021  ? MCV 81.2 05/21/2021  ? PLT 320 05/21/2021  ? ?  Chemistry   ?   ?Component Value Date/Time  ? NA 132 (L) 05/21/2021 1125  ? K 2.1 (LL) 05/21/2021 1125  ? CL 94 (L) 05/21/2021 1125  ? CO2 27 05/21/2021 1125  ? BUN 14 05/21/2021 1125  ? CREATININE 0.75 05/21/2021 1125  ? CREATININE 0.84 11/15/2019 1516  ?    ?Component Value Date/Time  ? CALCIUM 8.3 (L) 05/21/2021 1125  ? ALKPHOS 64 05/21/2021 1125  ? AST 32 05/21/2021 1125  ? ALT 46 (H) 05/21/2021 1125  ? BILITOT 0.4 05/21/2021 1125  ?  ? ? ?Impression and Plan: ?Ms. Heward is  a very charming 56 year old white female.  She has a localized rectal cancer.  By staging studies looks like this is a stage IIA. ? ?I am just disappointed that we have had such difficulty. ? ?I will hav

## 2021-05-21 NOTE — Patient Instructions (Signed)

## 2021-05-21 NOTE — Patient Instructions (Signed)
Potassium Acetate Injection What is this medication? POTASSIUM ACETATE (poe TASS i um ASa tate) prevents and treats low levels of potassium in your body. Potassium plays an important role in maintaining the health of your kidneys, heart, muscles, and nervous system. This medicine may be used for other purposes; ask your health care provider or pharmacist if you have questions. What should I tell my care team before I take this medication? They need to know if you have any of these conditions: Addison's disease Dehydration Diabetes Heart disease High levels of potassium in the blood Irregular heartbeat Kidney disease Liver disease Recent severe burn An unusual or allergic reaction to potassium, other medications, foods, dyes, or preservatives Pregnant or trying to get pregnant Breast-feeding How should I use this medication? This medication is for infusion into a vein. It is given in a hospital or clinic setting. Talk to your care team about the use of this medication in children. Special care may be needed. Overdosage: If you think you have taken too much of this medicine contact a poison control center or emergency room at once. NOTE: This medicine is only for you. Do not share this medicine with others. What if I miss a dose? This does not apply. What may interact with this medication? Do not take this medication with any of the following: Certain diuretics such as spironolactone, triamterene Eplerenone Sodium polystyrene sulfonate This medication may also interact with the following: Certain medications for blood pressure or heart disease like lisinopril, losartan, quinapril, valsartan Medications that lower your chance of fighting infection such as cyclosporine, tacrolimus NSAIDs, medications for pain and inflammation, like ibuprofen or naproxen Other potassium supplements Salt substitutes This list may not describe all possible interactions. Give your health care provider a  list of all the medicines, herbs, non-prescription drugs, or dietary supplements you use. Also tell them if you smoke, drink alcohol, or use illegal drugs. Some items may interact with your medicine. What should I watch for while using this medication? Your condition will be monitored carefully while you are receiving this medication. You may need blood work done while you are taking this medication. What side effects may I notice from receiving this medication? Side effects that you should report to your care team as soon as possible: Allergic reactions--skin rash, itching, hives, swelling of the face, lips, tongue, or throat High potassium level--muscle weakness, fast or irregular heartbeat Side effects that usually do not require medical attention (report these to your care team if they continue or are bothersome): Pain, redness, or irritation at injection site This list may not describe all possible side effects. Call your doctor for medical advice about side effects. You may report side effects to FDA at 1-800-FDA-1088. Where should I keep my medication? This medication is given in a hospital or clinic and will not be stored at home. NOTE: This sheet is a summary. It may not cover all possible information. If you have questions about this medicine, talk to your doctor, pharmacist, or health care provider.  2023 Elsevier/Gold Standard (2020-09-06 00:00:00)  

## 2021-05-21 NOTE — Progress Notes (Signed)
Ok to infuse Potassium 10 meq in 45 minutes per Dr Marin Olp.  Calculations done per [facility] ?

## 2021-05-22 ENCOUNTER — Inpatient Hospital Stay: Payer: BC Managed Care – PPO

## 2021-05-22 ENCOUNTER — Other Ambulatory Visit: Payer: Self-pay | Admitting: *Deleted

## 2021-05-22 ENCOUNTER — Other Ambulatory Visit: Payer: Self-pay

## 2021-05-22 VITALS — BP 96/62 | HR 101 | Temp 97.8°F | Resp 18

## 2021-05-22 DIAGNOSIS — C20 Malignant neoplasm of rectum: Secondary | ICD-10-CM

## 2021-05-22 DIAGNOSIS — E876 Hypokalemia: Secondary | ICD-10-CM | POA: Diagnosis not present

## 2021-05-22 DIAGNOSIS — Z5111 Encounter for antineoplastic chemotherapy: Secondary | ICD-10-CM | POA: Diagnosis not present

## 2021-05-22 DIAGNOSIS — R634 Abnormal weight loss: Secondary | ICD-10-CM | POA: Diagnosis not present

## 2021-05-22 DIAGNOSIS — K30 Functional dyspepsia: Secondary | ICD-10-CM

## 2021-05-22 DIAGNOSIS — Z87891 Personal history of nicotine dependence: Secondary | ICD-10-CM | POA: Diagnosis not present

## 2021-05-22 LAB — CMP (CANCER CENTER ONLY)
ALT: 65 U/L — ABNORMAL HIGH (ref 0–44)
AST: 44 U/L — ABNORMAL HIGH (ref 15–41)
Albumin: 3.4 g/dL — ABNORMAL LOW (ref 3.5–5.0)
Alkaline Phosphatase: 59 U/L (ref 38–126)
Anion gap: 10 (ref 5–15)
BUN: 12 mg/dL (ref 6–20)
CO2: 25 mmol/L (ref 22–32)
Calcium: 7.9 mg/dL — ABNORMAL LOW (ref 8.9–10.3)
Chloride: 97 mmol/L — ABNORMAL LOW (ref 98–111)
Creatinine: 0.76 mg/dL (ref 0.44–1.00)
GFR, Estimated: >60 mL/min (ref >60)
Glucose, Bld: 143 mg/dL — ABNORMAL HIGH (ref 70–99)
Potassium: 2.2 mmol/L — CL (ref 3.5–5.1)
Sodium: 132 mmol/L — ABNORMAL LOW (ref 135–145)
Total Bilirubin: 0.4 mg/dL (ref 0.3–1.2)
Total Protein: 5.5 g/dL — ABNORMAL LOW (ref 6.5–8.1)

## 2021-05-22 MED ORDER — SODIUM CHLORIDE 0.9 % IV SOLN: INTRAVENOUS | Status: DC

## 2021-05-22 MED ORDER — FAMOTIDINE IN NACL 20-0.9 MG/50ML-% IV SOLN
20.0000 mg | Freq: Once | INTRAVENOUS | Status: AC
  Administered 2021-05-22: 20 mg via INTRAVENOUS
  Filled 2021-05-22: qty 50

## 2021-05-22 MED ORDER — POTASSIUM CHLORIDE CRYS ER 20 MEQ PO TBCR
40.0000 meq | EXTENDED_RELEASE_TABLET | Freq: Once | ORAL | Status: AC
  Administered 2021-05-22: 40 meq via ORAL

## 2021-05-22 MED ORDER — POTASSIUM CHLORIDE CRYS ER 20 MEQ PO TBCR: 40.0000 meq | EXTENDED_RELEASE_TABLET | Freq: Once | ORAL | Status: DC

## 2021-05-22 MED ORDER — POTASSIUM CHLORIDE CRYS ER 20 MEQ PO TBCR: 40.0000 meq | EXTENDED_RELEASE_TABLET | Freq: Two times a day (BID) | ORAL | 0 refills | Status: DC

## 2021-05-22 MED ORDER — ALTEPLASE 2 MG IJ SOLR
2.0000 mg | Freq: Once | INTRAMUSCULAR | Status: AC
  Administered 2021-05-22: 2 mg
  Filled 2021-05-22: qty 2

## 2021-05-22 MED ORDER — SODIUM CHLORIDE 0.9 % IV SOLN
Freq: Once | INTRAVENOUS | Status: AC
  Filled 2021-05-22: qty 500

## 2021-05-22 NOTE — Progress Notes (Signed)
Panic potassium of 2.2 received from lab. Md aware. Verbal order for oral potassium in office while pt is here. Order placed ?

## 2021-05-22 NOTE — Patient Instructions (Addendum)
Take 2 potassium tablets (Total 40 meq) Twice daily for 10 days, then cut back to once daily. ? ? ? ?Hypokalemia ?Hypokalemia means that the amount of potassium in the blood is lower than normal. Potassium is a mineral (electrolyte) that helps regulate the amount of fluid in the body. It also stimulates muscle tightening (contraction) and helps nerves work properly. ?Normally, most of the body's potassium is inside cells, and only a very small amount is in the blood. Because the amount in the blood is so small, minor changes to potassium levels in the blood can be life-threatening. ?What are the causes? ?This condition may be caused by: ?Antibiotic medicine. ?Diarrhea or vomiting. Taking too much of a medicine that helps you have a bowel movement (laxative) can cause diarrhea and lead to hypokalemia. ?Chronic kidney disease (CKD). ?Medicines that help the body get rid of excess fluid (diuretics). ?Eating disorders, such as anorexia or bulimia. ?Low magnesium levels in the body. ?Sweating a lot. ?What are the signs or symptoms? ?Symptoms of this condition include: ?Weakness. ?Constipation. ?Fatigue. ?Muscle cramps. ?Mental confusion. ?Skipped heartbeats or irregular heartbeat (palpitations). ?Tingling or numbness. ?How is this diagnosed? ?This condition is diagnosed with a blood test. ?How is this treated? ?This condition may be treated by: ?Taking potassium supplements. ?Adjusting the medicines that you take. ?Eating more foods that contain a lot of potassium. ?If your potassium level is very low, you may need to get potassium through an IV and be monitored in the hospital. ?Follow these instructions at home: ?Eating and drinking ? ?Eat a healthy diet. A healthy diet includes fresh fruits and vegetables, whole grains, healthy fats, and lean proteins. ?If told, eat more foods that contain a lot of potassium. These include: ?Nuts, such as peanuts and pistachios. ?Seeds, such as sunflower seeds and pumpkin  seeds. ?Peas, lentils, and lima beans. ?Whole grain and bran cereals and breads. ?Fresh fruits and vegetables, such as apricots, avocado, bananas, cantaloupe, kiwi, oranges, tomatoes, asparagus, and potatoes. ?Juices, such as orange, tomato, and prune. ?Lean meats, including fish. ?Milk and milk products, such as yogurt. ?General instructions ?Take over-the-counter and prescription medicines only as told by your health care provider. This includes vitamins, natural food products, and supplements. ?Keep all follow-up visits. This is important. ?Contact a health care provider if: ?You have weakness that gets worse. ?You feel your heart pounding or racing. ?You vomit. ?You have diarrhea. ?You have diabetes and you have trouble keeping your blood sugar in your target range. ?Get help right away if: ?You have chest pain. ?You have shortness of breath. ?You have vomiting or diarrhea that lasts for more than 2 days. ?You faint. ?These symptoms may be an emergency. Get help right away. Call 911. ?Do not wait to see if the symptoms will go away. ?Do not drive yourself to the hospital. ?Summary ?Hypokalemia means that the amount of potassium in the blood is lower than normal. ?This condition is diagnosed with a blood test. ?Hypokalemia may be treated by taking potassium supplements, adjusting the medicines that you take, or eating more foods that are high in potassium. ?If your potassium level is very low, you may need to get potassium through an IV and be monitored in the hospital. ?This information is not intended to replace advice given to you by your health care provider. Make sure you discuss any questions you have with your health care provider. ?Document Revised: 09/13/2020 Document Reviewed: 09/13/2020 ?Elsevier Patient Education ? [place]. ?Potassium Acetate Injection ?What  is this medication? ?POTASSIUM ACETATE (poe TASS i um ASa tate) prevents and treats low levels of potassium in your body. Potassium  plays an important role in maintaining the health of your kidneys, heart, muscles, and nervous system. ?This medicine may be used for other purposes; ask your health care provider or pharmacist if you have questions. ?What should I tell my care team before I take this medication? ?They need to know if you have any of these conditions: ?Addison's disease ?Dehydration ?Diabetes ?Heart disease ?High levels of potassium in the blood ?Irregular heartbeat ?Kidney disease ?Liver disease ?Recent severe burn ?An unusual or allergic reaction to potassium, other medications, foods, dyes, or preservatives ?Pregnant or trying to get pregnant ?Breast-feeding ?How should I use this medication? ?This medication is for infusion into a vein. It is given in a hospital or clinic setting. ?Talk to your care team about the use of this medication in children. Special care may be needed. ?Overdosage: If you think you have taken too much of this medicine contact a poison control center or emergency room at once. ?NOTE: This medicine is only for you. Do not share this medicine with others. ?What if I miss a dose? ?This does not apply. ?What may interact with this medication? ?Do not take this medication with any of the following: ?Certain diuretics such as spironolactone, triamterene ?Eplerenone ?Sodium polystyrene sulfonate ?This medication may also interact with the following: ?Certain medications for blood pressure or heart disease like lisinopril, losartan, quinapril, valsartan ?Medications that lower your chance of fighting infection such as cyclosporine, tacrolimus ?NSAIDs, medications for pain and inflammation, like ibuprofen or naproxen ?Other potassium supplements ?Salt substitutes ?This list may not describe all possible interactions. Give your health care provider a list of all the medicines, herbs, non-prescription drugs, or dietary supplements you use. Also tell them if you smoke, drink alcohol, or use illegal drugs. Some items  may interact with your medicine. ?What should I watch for while using this medication? ?Your condition will be monitored carefully while you are receiving this medication. ?You may need blood work done while you are taking this medication. ?What side effects may I notice from receiving this medication? ?Side effects that you should report to your care team as soon as possible: ?Allergic reactions--skin rash, itching, hives, swelling of the face, lips, tongue, or throat ?High potassium level--muscle weakness, fast or irregular heartbeat ?Side effects that usually do not require medical attention (report these to your care team if they continue or are bothersome): ?Pain, redness, or irritation at injection site ?This list may not describe all possible side effects. Call your doctor for medical advice about side effects. You may report side effects to FDA at 1-800-FDA-1088. ?Where should I keep my medication? ?This medication is given in a hospital or clinic and will not be stored at home. ?NOTE: This sheet is a summary. It may not cover all possible information. If you have questions about this medicine, talk to your doctor, pharmacist, or health care provider. ?? 2023 Elsevier/Gold Standard (2020-09-06 00:00:00) ? ?

## 2021-05-23 ENCOUNTER — Other Ambulatory Visit (HOSPITAL_COMMUNITY): Payer: Self-pay

## 2021-05-24 ENCOUNTER — Ambulatory Visit: Payer: BC Managed Care – PPO | Admitting: Hematology & Oncology

## 2021-05-24 ENCOUNTER — Ambulatory Visit: Payer: BC Managed Care – PPO

## 2021-05-24 ENCOUNTER — Encounter: Payer: Self-pay | Admitting: *Deleted

## 2021-05-24 ENCOUNTER — Other Ambulatory Visit: Payer: BC Managed Care – PPO

## 2021-05-24 NOTE — Progress Notes (Signed)
Patient will not receive treatment today as she came in earlier this week for symptom management. Her treatment was rescheduled for 06/04/21. ? ?Called patient to check on how she was feeling. She is doing much better today. She states she's no longer nauseated and isn't vomiting. She still has slight diarrhea, but its much better and she is taking imodium as directed.  ? ?She is encouraged to call the office if she feels like her symptoms are worsening. She agrees. ? ?Oncology Nurse Navigator Documentation ? ? ?  05/24/2021  ?  9:15 AM  ?Oncology Nurse Navigator Flowsheets  ?Navigator Follow Up Date: 06/04/2021  ?Navigator Follow Up Reason: Follow-up Appointment;Chemotherapy  ?Navigator Location [facility]  ?Navigator Encounter Type Telephone;Appt/Treatment Plan Review  ?Telephone Patient Update;Outgoing Call  ?Patient Visit Type MedOnc  ?Treatment Phase Active Tx  ?Barriers/Navigation Needs Coordination of Care;Education  ?Education Pain/ Symptom Management  ?Interventions Education;Psycho-Social Support  ?Acuity Level 2-Minimal Needs (1-2 Barriers Identified)  ?Education Method Verbal  ?Support Groups/Services Friends and Family  ?Time Spent with Patient 15  ?  ?

## 2021-05-28 ENCOUNTER — Encounter: Payer: Self-pay | Admitting: Hematology & Oncology

## 2021-05-28 ENCOUNTER — Other Ambulatory Visit (HOSPITAL_COMMUNITY): Payer: Self-pay

## 2021-05-28 ENCOUNTER — Telehealth: Payer: Self-pay | Admitting: *Deleted

## 2021-05-28 LAB — DPD 5-FLUOROURACIL TOXICITY

## 2021-05-28 NOTE — Telephone Encounter (Signed)
Message received from patient's wife, [name] stating that she got a call from patient and pt states that she has had two stools with blood in them today.  Call placed back to patient and patient states that she has had two formed stools this morning and noticed a small amount of red blood when wiping.  She states that the diarrhea has stopped and would like to know if she should continue Bentyl and Carafate.  Dr. Marin Olp notified of above.  Call placed back to patient and patient notified per order of Dr. Marin Olp to call office back if bleeding worsens or continues and to take Bentyl and Carafate as needed.  Pt is appreciative of call back and has no questions at this time.  ?

## 2021-06-04 ENCOUNTER — Inpatient Hospital Stay (HOSPITAL_BASED_OUTPATIENT_CLINIC_OR_DEPARTMENT_OTHER): Payer: BC Managed Care – PPO | Admitting: Hematology & Oncology

## 2021-06-04 ENCOUNTER — Inpatient Hospital Stay: Payer: BC Managed Care – PPO

## 2021-06-04 ENCOUNTER — Encounter: Payer: Self-pay | Admitting: *Deleted

## 2021-06-04 ENCOUNTER — Encounter: Payer: Self-pay | Admitting: Hematology & Oncology

## 2021-06-04 ENCOUNTER — Other Ambulatory Visit (HOSPITAL_COMMUNITY): Payer: Self-pay

## 2021-06-04 VITALS — BP 108/75 | HR 93 | Temp 98.3°F | Resp 18 | Ht 67.0 in | Wt 291.2 lb

## 2021-06-04 DIAGNOSIS — C20 Malignant neoplasm of rectum: Secondary | ICD-10-CM

## 2021-06-04 DIAGNOSIS — E876 Hypokalemia: Secondary | ICD-10-CM | POA: Diagnosis not present

## 2021-06-04 DIAGNOSIS — Z5111 Encounter for antineoplastic chemotherapy: Secondary | ICD-10-CM | POA: Diagnosis not present

## 2021-06-04 DIAGNOSIS — R634 Abnormal weight loss: Secondary | ICD-10-CM | POA: Diagnosis not present

## 2021-06-04 DIAGNOSIS — Z87891 Personal history of nicotine dependence: Secondary | ICD-10-CM | POA: Diagnosis not present

## 2021-06-04 LAB — CMP (CANCER CENTER ONLY)
ALT: 37 U/L (ref 0–44)
AST: 23 U/L (ref 15–41)
Albumin: 3.7 g/dL (ref 3.5–5.0)
Alkaline Phosphatase: 102 U/L (ref 38–126)
Anion gap: 8 (ref 5–15)
BUN: 15 mg/dL (ref 6–20)
CO2: 26 mmol/L (ref 22–32)
Calcium: 8.9 mg/dL (ref 8.9–10.3)
Chloride: 103 mmol/L (ref 98–111)
Creatinine: 0.83 mg/dL (ref 0.44–1.00)
GFR, Estimated: >60 mL/min (ref >60)
Glucose, Bld: 137 mg/dL — ABNORMAL HIGH (ref 70–99)
Potassium: 3.1 mmol/L — ABNORMAL LOW (ref 3.5–5.1)
Sodium: 137 mmol/L (ref 135–145)
Total Bilirubin: 0.4 mg/dL (ref 0.3–1.2)
Total Protein: 6.6 g/dL (ref 6.5–8.1)

## 2021-06-04 LAB — CBC WITH DIFFERENTIAL (CANCER CENTER ONLY)
Abs Immature Granulocytes: 0.02 10*3/uL (ref 0.00–0.07)
Basophils Absolute: 0 10*3/uL (ref 0.0–0.1)
Basophils Relative: 1 %
Eosinophils Absolute: 1.5 10*3/uL — ABNORMAL HIGH (ref 0.0–0.5)
Eosinophils Relative: 17 %
HCT: 37.6 % (ref 36.0–46.0)
Hemoglobin: 12.4 g/dL (ref 12.0–15.0)
Immature Granulocytes: 0 %
Lymphocytes Relative: 18 %
Lymphs Abs: 1.5 10*3/uL (ref 0.7–4.0)
MCH: 28.7 pg (ref 26.0–34.0)
MCHC: 33 g/dL (ref 30.0–36.0)
MCV: 87 fL (ref 80.0–100.0)
Monocytes Absolute: 0.5 10*3/uL (ref 0.1–1.0)
Monocytes Relative: 6 %
Neutro Abs: 4.9 10*3/uL (ref 1.7–7.7)
Neutrophils Relative %: 58 %
Platelet Count: 191 10*3/uL (ref 150–400)
RBC: 4.32 MIL/uL (ref 3.87–5.11)
RDW: 15.2 % (ref 11.5–15.5)
WBC Count: 8.4 10*3/uL (ref 4.0–10.5)
nRBC: 0 % (ref 0.0–0.2)

## 2021-06-04 MED ORDER — HEPARIN SOD (PORK) LOCK FLUSH 100 UNIT/ML IV SOLN
500.0000 [IU] | Freq: Once | INTRAVENOUS | Status: AC | PRN
  Administered 2021-06-04: 500 [IU]

## 2021-06-04 MED ORDER — DEXTROSE 5 % IV SOLN: Freq: Once | INTRAVENOUS | Status: AC

## 2021-06-04 MED ORDER — SODIUM CHLORIDE 0.9% FLUSH
10.0000 mL | INTRAVENOUS | Status: DC | PRN
  Administered 2021-06-04: 10 mL

## 2021-06-04 MED ORDER — SODIUM CHLORIDE 0.9 % IV SOLN
10.0000 mg | Freq: Once | INTRAVENOUS | Status: AC
  Administered 2021-06-04: 10 mg via INTRAVENOUS
  Filled 2021-06-04: qty 10

## 2021-06-04 MED ORDER — OXALIPLATIN CHEMO INJECTION 100 MG/20ML
104.0000 mg/m2 | Freq: Once | INTRAVENOUS | Status: AC
  Administered 2021-06-04: 265 mg via INTRAVENOUS
  Filled 2021-06-04: qty 53

## 2021-06-04 MED ORDER — PALONOSETRON HCL INJECTION 0.25 MG/5ML
0.2500 mg | Freq: Once | INTRAVENOUS | Status: AC
  Administered 2021-06-04: 0.25 mg via INTRAVENOUS
  Filled 2021-06-04: qty 5

## 2021-06-04 NOTE — Progress Notes (Unsigned)
Hematology and Oncology Follow Up Visit  Janice Lucas [id number] [birth year] 56 y.o. 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  She really has had a tough time with the Xeloda.  She really had a horrible GI toxicity.  We did do a test for the  DPD deficiency.  She does not have this.  She is feeling much better.  There is no further abdominal pain.  She has had a couple bouts of bright red blood per rectum.  She has had no cough.  She has had no fever.  She did have some mouth sores with the Xeloda.  Then going to have to make a change with the protocol.  I think we have to try her on FOLFOX.  I think this would be a reasonable neoadjuvant protocol for her.  I think she sees Dr. Morton Stall at [facility] next week.  We are going to have to tell him that she is going to be little bit delayed in respect to any count of surgery.  We have not even started the chemoradiation therapy on her.  Today, I will just give the oxaliplatin.  At least, we can treat her with this so that she can still have some therapy in her to try to help with regressing her rectal cancer.  Her potassium is doing much better which is nice to see.  She has had no headache.  There is no visual changes.  Overall, I would say performance status is probably ECOG 1.       Medications:  Current Outpatient Medications:    acetaZOLAMIDE ER (DIAMOX) 500 MG capsule, Take 2 capsules (1,000 mg total) by mouth 2 (two) times daily., Disp: 360 capsule, Rfl: 3   Ascorbic Acid (VITAMIN C) 1000 MG tablet, Take 1,000 mg by mouth daily., Disp: , Rfl:    aspirin EC 81 MG tablet, Take 81 mg by mouth daily., Disp: , Rfl:    atorvastatin (LIPITOR) 20 MG tablet, Take 1 tablet  by mouth daily.,  Disp: 90 tablet, Rfl: 3   azelastine (ASTELIN) 0.1 % nasal spray, Place 2 sprays into both nostrils 2 (two) times daily., Disp: 30 mL, Rfl: 1   Biotin 10000 MCG TABS, Take by mouth daily., Disp: , Rfl:    Calcium Carb-Cholecalciferol (CALCIUM 1000 + D PO), Take by mouth 2 (two) times daily., Disp: , Rfl:    cholecalciferol (VITAMIN D3) 25 MCG (1000 UT) tablet, Take 1,000 Units by mouth daily., Disp: , Rfl:    fluticasone (FLONASE) 50 MCG/ACT nasal spray, Place 2 sprays into both nostrils daily. (Patient taking differently: Place 2 sprays into both nostrils daily. As needed), Disp: 16 g, Rfl: 6   lisinopril-hydrochlorothiazide (ZESTORETIC) 20-25 MG tablet, Take 1 tablet by mouth daily., Disp: 90 tablet, Rfl: 3   nortriptyline (PAMELOR) 50 MG capsule, TAKE 2 CAPSULES BY MOUTH EVERY NIGHT AS DIRECTED, Disp: 180 capsule, Rfl: 3   omeprazole (PRILOSEC) 20 MG capsule, Take 1 capsule (20 mg total) by mouth daily., Disp: 90 capsule, Rfl: 3   ondansetron (ZOFRAN) 8 MG tablet, Take 1 tablet (8 mg total) by mouth 2 (two) times daily as needed for refractory nausea / vomiting. Start on day 3 after chemotherapy., Disp: 30 tablet, Rfl: 1   potassium  chloride SA (KLOR-CON M) 20 MEQ tablet, Take 2 tablets (40 mEq total) by mouth 2 (two) times daily. Take 2 tablets (40 meq total) by mouth 2 (two) times daily x 10 days then once daily., Disp: 60 tablet, Rfl: 0   prochlorperazine (COMPAZINE) 10 MG tablet, Take 1 tablet (10 mg total) by mouth every 6 (six) hours as needed (Nausea or vomiting)., Disp: 30 tablet, Rfl: 1   sertraline (ZOLOFT) 25 MG tablet, Take 12.5 mg by mouth daily., Disp: , Rfl:    sucralfate (CARAFATE) 1 GM/10ML suspension, Take 10 mLs (1 g total) by mouth 4 (four) times daily., Disp: 420 mL, Rfl: 1   topiramate (TOPAMAX) 100 MG tablet, Take 1 tablet (100 mg total) by mouth 2 (two) times daily., Disp: 180 tablet, Rfl: 3   traMADol (ULTRAM) 50 MG tablet, Take 1 tablet (50 mg total) by mouth every 8  (eight) hours as needed., Disp: 30 tablet, Rfl: 1   vitamin E 1000 UNIT capsule, Take 1,000 Units by mouth daily., Disp: , Rfl:    acetaminophen (TYLENOL) 500 MG tablet, Take 500 mg by mouth every 6 (six) hours as needed. (Patient not taking: Reported on 06/04/2021), Disp: , Rfl:    albuterol (VENTOLIN HFA) 108 (90 Base) MCG/ACT inhaler, Inhale 2 puffs into the lungs every 6 (six) hours as needed for wheezing or shortness of breath. (Patient not taking: Reported on 05/21/2021), Disp: 1 each, Rfl: 0   capecitabine (XELODA) 500 MG tablet, Take 4 tablets (2,000 mg total) by mouth 2 (two) times daily after a meal. Take twice a day for 14 days on and 7 days off. (Patient not taking: Reported on 06/04/2021), Disp: 112 tablet, Rfl: 3   dexamethasone (DECADRON) 4 MG tablet, Take 2 tablets (8 mg total) by mouth daily. Start the day after chemotherapy for 2 days. Take with food. (Patient not taking: Reported on 06/04/2021), Disp: 30 tablet, Rfl: 1   dicyclomine (BENTYL) 10 MG capsule, Take 1 capsule (10 mg total) by mouth 4 (four) times daily -  before meals and at bedtime. (Patient not taking: Reported on 06/04/2021), Disp: 90 capsule, Rfl: 1   lidocaine-prilocaine (EMLA) cream, Apply to affected area once (Patient not taking: Reported on 06/04/2021), Disp: 30 g, Rfl: 3   MAGNESIUM PO, Take by mouth. (Patient not taking: Reported on 06/04/2021), Disp: , Rfl:  No current facility-administered medications for this visit.  Facility-Administered Medications Ordered in Other Visits:    heparin lock flush 100 unit/mL, 500 Units, Intracatheter, Once PRN, Ory Elting, Rudell Cobb, MD   sodium chloride flush (NS) 0.9 % injection 10 mL, 10 mL, Intracatheter, PRN, Volanda Napoleon, MD  Allergies:  Allergies  Allergen Reactions   Sulfa Antibiotics Other (See Comments)    Acts "goofy"   Neosporin  [Neomycin-Bacitracin Zn-Polymyx] Rash    Past Medical History, Surgical history, Social history, and Family History were reviewed and  updated.  Review of Systems: Review of Systems  Constitutional: Negative.   HENT:  Negative.    Eyes: Negative.   Respiratory: Negative.    Cardiovascular: Negative.   Gastrointestinal: Negative.   Endocrine: Negative.   Genitourinary: Negative.    Musculoskeletal: Negative.   Skin: Negative.   Neurological: Negative.   Hematological: Negative.   Psychiatric/Behavioral: Negative.     Physical Exam:  height is '5\' 7"'$  (1.702 m) and weight is 291 lb 4 oz (132.1 kg). Her oral temperature is 98.3 F (36.8 C). Her blood pressure is 108/75 and her pulse is  93. Her respiration is 18 and oxygen saturation is 99%.   Wt Readings from Last 3 Encounters:  06/04/21 291 lb 4 oz (132.1 kg)  05/21/21 285 lb 12 oz (129.6 kg)  05/02/21 300 lb (136.1 kg)    Physical Exam Vitals reviewed.  HENT:     Head: Normocephalic and atraumatic.  Eyes:     Pupils: Pupils are equal, round, and reactive to light.  Cardiovascular:     Rate and Rhythm: Normal rate and regular rhythm.     Heart sounds: Normal heart sounds.  Pulmonary:     Effort: Pulmonary effort is normal.     Breath sounds: Normal breath sounds.  Abdominal:     General: Bowel sounds are normal.     Palpations: Abdomen is soft.  Musculoskeletal:        General: No tenderness or deformity. Normal range of motion.     Cervical back: Normal range of motion.  Lymphadenopathy:     Cervical: No cervical adenopathy.  Skin:    General: Skin is warm and dry.     Findings: No erythema or rash.  Neurological:     Mental Status: She is alert and oriented to person, place, and time.  Psychiatric:        Behavior: Behavior normal.        Thought Content: Thought content normal.        Judgment: Judgment normal.     Lab Results  Component Value Date   WBC 8.4 06/04/2021   HGB 12.4 06/04/2021   HCT 37.6 06/04/2021   MCV 87.0 06/04/2021   PLT 191 06/04/2021     Chemistry      Component Value Date/Time   NA 137 06/04/2021 0859   K  3.1 (L) 06/04/2021 0859   CL 103 06/04/2021 0859   CO2 26 06/04/2021 0859   BUN 15 06/04/2021 0859   CREATININE 0.83 06/04/2021 0859   CREATININE 0.84 11/15/2019 1516      Component Value Date/Time   CALCIUM 8.9 06/04/2021 0859   ALKPHOS 102 06/04/2021 0859   AST 23 06/04/2021 0859   ALT 37 06/04/2021 0859   BILITOT 0.4 06/04/2021 0859      Impression and Plan: Janice Lucas is a very charming 56 year old white female.  She has a localized rectal cancer.  By staging studies looks like this is a stage IIA.  Again, given the difficulties that she has had with her Xeloda, we will switch her over to IV 5-FU.  We will start her in a couple weeks.  Again, it would be nice to try to give her 3 cycles if we can and then reevaluate her.  I still think she is going need to have chemoradiation prior to her surgery.  I will have to speak with Dr. Morton Stall of Surgical oncology at [facility].  Is happy to see that she is feeling better.  I told her to keep taking the potassium.  I would like to see her back in a couple weeks when she starts the FOLFOX protocol.     Volanda Napoleon, MD 5/23/20231:44 PM

## 2021-06-04 NOTE — Patient Instructions (Signed)
[facility]  Discharge Instructions: Thank you for choosing [facility] to provide your oncology and hematology care.   If you have a lab appointment with the [facility], please go directly to the [facility] and check in at the registration area.  Wear comfortable clothing and clothing appropriate for easy access to any Portacath or PICC line.   We strive to give you quality time with your provider. You may need to reschedule your appointment if you arrive late (15 or more minutes).  Arriving late affects you and other patients whose appointments are after yours.  Also, if you miss three or more appointments without notifying the office, you may be dismissed from the clinic at the provider's discretion.      For prescription refill requests, have your pharmacy contact our office and allow 72 hours for refills to be completed.    Today you received the following chemotherapy and/or immunotherapy agents Oxaliplatin      To help prevent nausea and vomiting after your treatment, we encourage you to take your nausea medication as directed.  BELOW ARE SYMPTOMS THAT SHOULD BE REPORTED IMMEDIATELY: *FEVER GREATER THAN 100.4 F (38 C) OR HIGHER *CHILLS OR SWEATING *NAUSEA AND VOMITING THAT IS NOT CONTROLLED WITH YOUR NAUSEA MEDICATION *UNUSUAL SHORTNESS OF BREATH *UNUSUAL BRUISING OR BLEEDING *URINARY PROBLEMS (pain or burning when urinating, or frequent urination) *BOWEL PROBLEMS (unusual diarrhea, constipation, pain near the anus) TENDERNESS IN MOUTH AND THROAT WITH OR WITHOUT PRESENCE OF ULCERS (sore throat, sores in mouth, or a toothache) UNUSUAL RASH, SWELLING OR PAIN  UNUSUAL VAGINAL DISCHARGE OR ITCHING   Items with * indicate a potential emergency and should be followed up as soon as possible or go to the Emergency Department if any problems should occur.  Please show the CHEMOTHERAPY ALERT CARD or IMMUNOTHERAPY ALERT CARD at check-in to the  Emergency Department and triage nurse. Should you have questions after your visit or need to cancel or reschedule your appointment, please contact [facility]  [phone] and follow the prompts.  Office hours are 8:00 a.m. to 4:30 p.m. Monday - Friday. Please note that voicemails left after 4:00 p.m. may not be returned until the following business day.  We are closed weekends and major holidays. You have access to a nurse at all times for urgent questions. Please call the main number to the clinic [phone] and follow the prompts.  For any non-urgent questions, you may also contact your provider using MyChart. We now offer e-Visits for anyone 56 and older to request care online for non-urgent symptoms. For details visit [email or web address].   Also download the MyChart app! Go to the app store, search "MyChart", open the app, select [facility], and log in with your MyChart username and password.  Due to Covid, a mask is required upon entering the hospital/clinic. If you do not have a mask, one will be given to you upon arrival. For doctor visits, patients may have 1 support person aged 56 or older with them. For treatment visits, patients cannot have anyone with them due to current Covid guidelines and our immunocompromised population.

## 2021-06-04 NOTE — Patient Instructions (Signed)

## 2021-06-04 NOTE — Progress Notes (Unsigned)
Patient had significant toxicity from the Xeloda. Will transition treatment plans from Xelox to FOLFOX. Today she feels well and thinks that most of her symptoms have resolved after stopping the Xeloda.   Oncology Nurse Navigator Documentation     06/04/2021    9:30 AM  Oncology Nurse Navigator Flowsheets  Navigator Follow Up Date: 06/17/2021  Navigator Follow Up Reason: Follow-up Appointment;Chemotherapy  Navigator Location [facility]  Navigator Encounter Type Treatment;Appt/Treatment Plan Review  Patient Visit Type MedOnc  Treatment Phase Active Tx  Barriers/Navigation Needs Coordination of Care;Education  Interventions Psycho-Social Support  Acuity Level 2-Minimal Needs (1-2 Barriers Identified)  Support Groups/Services Friends and Family  Time Spent with Patient 15

## 2021-06-05 ENCOUNTER — Encounter: Payer: Self-pay | Admitting: Hematology & Oncology

## 2021-06-06 ENCOUNTER — Encounter: Payer: Self-pay | Admitting: Hematology & Oncology

## 2021-06-07 NOTE — Progress Notes (Signed)
Pharmacist Chemotherapy Monitoring - Initial Assessment    Anticipated start date: 06/17/21   The following has been reviewed per standard work regarding the patient's treatment regimen: The patient's diagnosis, treatment plan and drug doses, and organ/hematologic function Lab orders and baseline tests specific to treatment regimen  The treatment plan start date, drug sequencing, and pre-medications Prior authorization status  Patient's documented medication list, including drug-drug interaction screen and prescriptions for anti-emetics and supportive care specific to the treatment regimen The drug concentrations, fluid compatibility, administration routes, and timing of the medications to be used The patient's access for treatment and lifetime cumulative dose history, if applicable  The patient's medication allergies and previous infusion related reactions, if applicable   Changes made to treatment plan:  treatment plan date  Follow up needed:  N/A   Torre Schaumburg, Jacqlyn Larsen, [facility], 06/07/2021  7:54 AM

## 2021-06-12 ENCOUNTER — Other Ambulatory Visit: Payer: Self-pay | Admitting: *Deleted

## 2021-06-13 ENCOUNTER — Other Ambulatory Visit (HOSPITAL_COMMUNITY): Payer: Self-pay

## 2021-06-17 ENCOUNTER — Inpatient Hospital Stay: Payer: BC Managed Care – PPO | Attending: Hematology & Oncology

## 2021-06-17 ENCOUNTER — Inpatient Hospital Stay: Payer: BC Managed Care – PPO

## 2021-06-17 ENCOUNTER — Inpatient Hospital Stay (HOSPITAL_BASED_OUTPATIENT_CLINIC_OR_DEPARTMENT_OTHER): Payer: BC Managed Care – PPO | Admitting: Hematology & Oncology

## 2021-06-17 ENCOUNTER — Encounter: Payer: Self-pay | Admitting: *Deleted

## 2021-06-17 ENCOUNTER — Encounter: Payer: Self-pay | Admitting: Hematology & Oncology

## 2021-06-17 VITALS — BP 105/60 | HR 95 | Temp 98.2°F | Resp 20 | Ht 67.0 in | Wt 290.0 lb

## 2021-06-17 DIAGNOSIS — C20 Malignant neoplasm of rectum: Secondary | ICD-10-CM

## 2021-06-17 DIAGNOSIS — I82432 Acute embolism and thrombosis of left popliteal vein: Secondary | ICD-10-CM | POA: Diagnosis not present

## 2021-06-17 DIAGNOSIS — Z452 Encounter for adjustment and management of vascular access device: Secondary | ICD-10-CM | POA: Insufficient documentation

## 2021-06-17 DIAGNOSIS — Z87891 Personal history of nicotine dependence: Secondary | ICD-10-CM | POA: Diagnosis not present

## 2021-06-17 DIAGNOSIS — Z5111 Encounter for antineoplastic chemotherapy: Secondary | ICD-10-CM | POA: Insufficient documentation

## 2021-06-17 DIAGNOSIS — Z7901 Long term (current) use of anticoagulants: Secondary | ICD-10-CM | POA: Insufficient documentation

## 2021-06-17 LAB — CBC WITH DIFFERENTIAL (CANCER CENTER ONLY)
Abs Immature Granulocytes: 0.02 10*3/uL (ref 0.00–0.07)
Basophils Absolute: 0 10*3/uL (ref 0.0–0.1)
Basophils Relative: 0 %
Eosinophils Absolute: 0.3 10*3/uL (ref 0.0–0.5)
Eosinophils Relative: 4 %
HCT: 37.3 % (ref 36.0–46.0)
Hemoglobin: 12.3 g/dL (ref 12.0–15.0)
Immature Granulocytes: 0 %
Lymphocytes Relative: 16 %
Lymphs Abs: 1.3 10*3/uL (ref 0.7–4.0)
MCH: 29.1 pg (ref 26.0–34.0)
MCHC: 33 g/dL (ref 30.0–36.0)
MCV: 88.2 fL (ref 80.0–100.0)
Monocytes Absolute: 0.7 10*3/uL (ref 0.1–1.0)
Monocytes Relative: 8 %
Neutro Abs: 6.3 10*3/uL (ref 1.7–7.7)
Neutrophils Relative %: 72 %
Platelet Count: 301 10*3/uL (ref 150–400)
RBC: 4.23 MIL/uL (ref 3.87–5.11)
RDW: 15.5 % (ref 11.5–15.5)
WBC Count: 8.7 10*3/uL (ref 4.0–10.5)
nRBC: 0 % (ref 0.0–0.2)

## 2021-06-17 LAB — CMP (CANCER CENTER ONLY)
ALT: 21 U/L (ref 0–44)
AST: 18 U/L (ref 15–41)
Albumin: 4 g/dL (ref 3.5–5.0)
Alkaline Phosphatase: 90 U/L (ref 38–126)
Anion gap: 10 (ref 5–15)
BUN: 12 mg/dL (ref 6–20)
CO2: 23 mmol/L (ref 22–32)
Calcium: 9 mg/dL (ref 8.9–10.3)
Chloride: 104 mmol/L (ref 98–111)
Creatinine: 0.86 mg/dL (ref 0.44–1.00)
GFR, Estimated: >60 mL/min (ref >60)
Glucose, Bld: 141 mg/dL — ABNORMAL HIGH (ref 70–99)
Potassium: 3.2 mmol/L — ABNORMAL LOW (ref 3.5–5.1)
Sodium: 137 mmol/L (ref 135–145)
Total Bilirubin: 0.3 mg/dL (ref 0.3–1.2)
Total Protein: 6.9 g/dL (ref 6.5–8.1)

## 2021-06-17 LAB — CEA (IN HOUSE-CHCC): CEA (CHCC-In House): 6.1 ng/mL — ABNORMAL HIGH (ref 0.00–5.00)

## 2021-06-17 LAB — IRON AND IRON BINDING CAPACITY (CC-WL,HP ONLY)
Iron: 48 ug/dL (ref 28–170)
Saturation Ratios: 15 % (ref 10.4–31.8)
TIBC: 311 ug/dL (ref 250–450)
UIBC: 263 ug/dL (ref 148–442)

## 2021-06-17 LAB — LACTATE DEHYDROGENASE: LDH: 236 U/L — ABNORMAL HIGH (ref 98–192)

## 2021-06-17 LAB — FERRITIN: Ferritin: 99 ng/mL (ref 11–307)

## 2021-06-17 MED ORDER — DEXTROSE 5 % IV SOLN: Freq: Once | INTRAVENOUS | Status: DC

## 2021-06-17 MED ORDER — DEXTROSE 5 % IV SOLN: Freq: Once | INTRAVENOUS | Status: AC

## 2021-06-17 MED ORDER — OXALIPLATIN CHEMO INJECTION 100 MG/20ML
85.0000 mg/m2 | Freq: Once | INTRAVENOUS | Status: AC
  Administered 2021-06-17: 215 mg via INTRAVENOUS
  Filled 2021-06-17: qty 40

## 2021-06-17 MED ORDER — LEUCOVORIN CALCIUM INJECTION 350 MG
400.0000 mg/m2 | Freq: Once | INTRAVENOUS | Status: AC
  Administered 2021-06-17: 1000 mg via INTRAVENOUS
  Filled 2021-06-17: qty 50

## 2021-06-17 MED ORDER — FLUOROURACIL CHEMO INJECTION 2.5 GM/50ML
300.0000 mg/m2 | Freq: Once | INTRAVENOUS | Status: AC
  Administered 2021-06-17: 750 mg via INTRAVENOUS
  Filled 2021-06-17: qty 15

## 2021-06-17 MED ORDER — SODIUM CHLORIDE 0.9 % IV SOLN
10.0000 mg | Freq: Once | INTRAVENOUS | Status: AC
  Administered 2021-06-17: 10 mg via INTRAVENOUS
  Filled 2021-06-17: qty 10

## 2021-06-17 MED ORDER — PALONOSETRON HCL INJECTION 0.25 MG/5ML
0.2500 mg | Freq: Once | INTRAVENOUS | Status: AC
  Administered 2021-06-17: 0.25 mg via INTRAVENOUS

## 2021-06-17 MED ORDER — SODIUM CHLORIDE 0.9 % IV SOLN
5000.0000 mg | INTRAVENOUS | Status: DC
  Administered 2021-06-17: 5000 mg via INTRAVENOUS
  Filled 2021-06-17: qty 100

## 2021-06-17 NOTE — Progress Notes (Signed)
Hematology and Oncology Follow Up Visit  Janice Lucas [id number] 1965-04-04 56 y.o. 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  She is doing pretty well.  She had no problems just with oxaliplatin.  We will now give her FOLFOX.  She has had no change in bowel or bladder habits.  She has had no mouth sores.  There has been no fever.  She has had no bleeding.  She is still working.  She has had no leg swelling.  She has had no nausea or vomiting..  She is on supplemental potassium.  She is doing well with this.  She has had no fever.  Overall, I would say performance status is probably ECOG 0.      Medications:  Current Outpatient Medications:    acetaminophen (TYLENOL) 500 MG tablet, Take 500 mg by mouth every 6 (six) hours as needed., Disp: , Rfl:    acetaZOLAMIDE ER (DIAMOX) 500 MG capsule, Take 2 capsules (1,000 mg total) by mouth 2 (two) times daily., Disp: 360 capsule, Rfl: 3   Ascorbic Acid (VITAMIN C) 1000 MG tablet, Take 1,000 mg by mouth daily., Disp: , Rfl:    aspirin EC 81 MG tablet, Take 81 mg by mouth daily., Disp: , Rfl:    atorvastatin (LIPITOR) 20 MG tablet, Take 1 tablet  by mouth daily., Disp: 90 tablet, Rfl: 3   Biotin 10000 MCG TABS, Take by mouth daily., Disp: , Rfl:    Calcium Carb-Cholecalciferol (CALCIUM 1000 + D PO), Take by mouth 2 (two) times daily., Disp: , Rfl:    cholecalciferol (VITAMIN D3) 25 MCG (1000 UT) tablet, Take 1,000 Units by mouth daily., Disp: , Rfl:    fluticasone (FLONASE) 50 MCG/ACT nasal spray, Place 2 sprays into both nostrils daily. (Patient taking differently: Place 2 sprays into both nostrils daily. As needed), Disp: 16 g, Rfl: 6   lisinopril-hydrochlorothiazide (ZESTORETIC) 20-25 MG tablet,  Take 1 tablet by mouth daily., Disp: 90 tablet, Rfl: 3   nortriptyline (PAMELOR) 50 MG capsule, TAKE 2 CAPSULES BY MOUTH EVERY NIGHT AS DIRECTED, Disp: 180 capsule, Rfl: 3   omeprazole (PRILOSEC) 20 MG capsule, Take 1 capsule (20 mg total) by mouth daily., Disp: 90 capsule, Rfl: 3   potassium chloride SA (KLOR-CON M) 20 MEQ tablet, Take 2 tablets (40 mEq total) by mouth 2 (two) times daily. Take 2 tablets (40 meq total) by mouth 2 (two) times daily x 10 days then once daily., Disp: 60 tablet, Rfl: 0   sertraline (ZOLOFT) 25 MG tablet, Take 12.5 mg by mouth daily., Disp: , Rfl:    topiramate (TOPAMAX) 100 MG tablet, Take 1 tablet (100 mg total) by mouth 2 (two) times daily., Disp: 180 tablet, Rfl: 3   vitamin E 1000 UNIT capsule, Take 1,000 Units by mouth daily., Disp: , Rfl:    albuterol (VENTOLIN HFA) 108 (90 Base) MCG/ACT inhaler, Inhale 2 puffs into the lungs every 6 (six) hours as needed for wheezing or shortness of breath. (Patient not taking: Reported on 05/21/2021), Disp: 1 each, Rfl: 0   capecitabine (XELODA) 500 MG tablet, Take 4 tablets (2,000 mg total) by mouth 2 (two) times daily after a meal. Take twice a day for 14 days on and 7 days off. (Patient  not taking: Reported on 06/04/2021), Disp: 112 tablet, Rfl: 3   dicyclomine (BENTYL) 10 MG capsule, Take 1 capsule (10 mg total) by mouth 4 (four) times daily -  before meals and at bedtime. (Patient not taking: Reported on 06/04/2021), Disp: 90 capsule, Rfl: 1   sucralfate (CARAFATE) 1 GM/10ML suspension, Take 10 mLs (1 g total) by mouth 4 (four) times daily. (Patient not taking: Reported on 06/17/2021), Disp: 420 mL, Rfl: 1   traMADol (ULTRAM) 50 MG tablet, Take 1 tablet (50 mg total) by mouth every 8 (eight) hours as needed. (Patient not taking: Reported on 06/17/2021), Disp: 30 tablet, Rfl: 1  Allergies:  Allergies  Allergen Reactions   Sulfa Antibiotics Other (See Comments)    Acts "goofy"   Neosporin  [Neomycin-Bacitracin Zn-Polymyx] Rash     Past Medical History, Surgical history, Social history, and Family History were reviewed and updated.  Review of Systems: Review of Systems  Constitutional: Negative.   HENT:  Negative.    Eyes: Negative.   Respiratory: Negative.    Cardiovascular: Negative.   Gastrointestinal: Negative.   Endocrine: Negative.   Genitourinary: Negative.    Musculoskeletal: Negative.   Skin: Negative.   Neurological: Negative.   Hematological: Negative.   Psychiatric/Behavioral: Negative.     Physical Exam:  height is '5\' 7"'$  (1.702 m) and weight is 290 lb (131.5 kg). Her oral temperature is 98.2 F (36.8 C). Her blood pressure is 105/60 and her pulse is 95. Her respiration is 20 and oxygen saturation is 99%.   Wt Readings from Last 3 Encounters:  06/17/21 290 lb (131.5 kg)  06/04/21 291 lb 4 oz (132.1 kg)  05/21/21 285 lb 12 oz (129.6 kg)    Physical Exam Vitals reviewed.  HENT:     Head: Normocephalic and atraumatic.  Eyes:     Pupils: Pupils are equal, round, and reactive to light.  Cardiovascular:     Rate and Rhythm: Normal rate and regular rhythm.     Heart sounds: Normal heart sounds.  Pulmonary:     Effort: Pulmonary effort is normal.     Breath sounds: Normal breath sounds.  Abdominal:     General: Bowel sounds are normal.     Palpations: Abdomen is soft.  Musculoskeletal:        General: No tenderness or deformity. Normal range of motion.     Cervical back: Normal range of motion.  Lymphadenopathy:     Cervical: No cervical adenopathy.  Skin:    General: Skin is warm and dry.     Findings: No erythema or rash.  Neurological:     Mental Status: She is alert and oriented to person, place, and time.  Psychiatric:        Behavior: Behavior normal.        Thought Content: Thought content normal.        Judgment: Judgment normal.     Lab Results  Component Value Date   WBC 8.7 06/17/2021   HGB 12.3 06/17/2021   HCT 37.3 06/17/2021   MCV 88.2 06/17/2021   PLT  301 06/17/2021     Chemistry      Component Value Date/Time   NA 137 06/17/2021 0940   K 3.2 (L) 06/17/2021 0940   CL 104 06/17/2021 0940   CO2 23 06/17/2021 0940   BUN 12 06/17/2021 0940   CREATININE 0.86 06/17/2021 0940   CREATININE 0.84 11/15/2019 1516      Component Value Date/Time   CALCIUM 9.0  06/17/2021 0940   ALKPHOS 90 06/17/2021 0940   AST 18 06/17/2021 0940   ALT 21 06/17/2021 0940   BILITOT 0.3 06/17/2021 0940      Impression and Plan: Janice Lucas is a very charming 56 year old white female.  She has a localized rectal cancer.  By staging studies looks like this is a stage IIA.  I still feel that we can give her neoadjuvant chemotherapy.  Then we can follow with chemo and radiation therapy.  And hopefully surgery.  We have had good success with this protocol.  I will go ahead with her FOLFOX today.  I will have to speak with Dr. Morton Stall.  I really think that given her young age and good shape, we can try to be aggressive.  I will plan to see her back in 2 weeks for her next cycle of FOLFOX.  I will plan for 3 cycles of FOLFOX and then radiation therapy.  He may be using low-dose Xeloda, she will tolerate better.     Volanda Napoleon, MD 6/5/202310:48 AM

## 2021-06-17 NOTE — Patient Instructions (Signed)
[facility]  Discharge Instructions: Thank you for choosing [facility] to provide your oncology and hematology care.   If you have a lab appointment with the [facility], please go directly to the [facility] and check in at the registration area.  Wear comfortable clothing and clothing appropriate for easy access to any Portacath or PICC line.   We strive to give you quality time with your provider. You may need to reschedule your appointment if you arrive late (15 or more minutes).  Arriving late affects you and other patients whose appointments are after yours.  Also, if you miss three or more appointments without notifying the office, you may be dismissed from the clinic at the provider's discretion.      For prescription refill requests, have your pharmacy contact our office and allow 72 hours for refills to be completed.    Today you received the following chemotherapy and/or immunotherapy agents Oxaliplatin      To help prevent nausea and vomiting after your treatment, we encourage you to take your nausea medication as directed.  BELOW ARE SYMPTOMS THAT SHOULD BE REPORTED IMMEDIATELY: *FEVER GREATER THAN 100.4 F (38 C) OR HIGHER *CHILLS OR SWEATING *NAUSEA AND VOMITING THAT IS NOT CONTROLLED WITH YOUR NAUSEA MEDICATION *UNUSUAL SHORTNESS OF BREATH *UNUSUAL BRUISING OR BLEEDING *URINARY PROBLEMS (pain or burning when urinating, or frequent urination) *BOWEL PROBLEMS (unusual diarrhea, constipation, pain near the anus) TENDERNESS IN MOUTH AND THROAT WITH OR WITHOUT PRESENCE OF ULCERS (sore throat, sores in mouth, or a toothache) UNUSUAL RASH, SWELLING OR PAIN  UNUSUAL VAGINAL DISCHARGE OR ITCHING   Items with * indicate a potential emergency and should be followed up as soon as possible or go to the Emergency Department if any problems should occur.  Please show the CHEMOTHERAPY ALERT CARD or IMMUNOTHERAPY ALERT CARD at check-in to the  Emergency Department and triage nurse. Should you have questions after your visit or need to cancel or reschedule your appointment, please contact [facility]  [phone] and follow the prompts.  Office hours are 8:00 a.m. to 4:30 p.m. Monday - Friday. Please note that voicemails left after 4:00 p.m. may not be returned until the following business day.  We are closed weekends and major holidays. You have access to a nurse at all times for urgent questions. Please call the main number to the clinic [phone] and follow the prompts.  For any non-urgent questions, you may also contact your provider using MyChart. We now offer e-Visits for anyone 38 and older to request care online for non-urgent symptoms. For details visit [email or web address].   Also download the MyChart app! Go to the app store, search "MyChart", open the app, select [facility], and log in with your MyChart username and password.  Due to Covid, a mask is required upon entering the hospital/clinic. If you do not have a mask, one will be given to you upon arrival. For doctor visits, patients may have 1 support person aged 51 or older with them. For treatment visits, patients cannot have anyone with them due to current Covid guidelines and our immunocompromised population.

## 2021-06-17 NOTE — Patient Instructions (Signed)

## 2021-06-17 NOTE — Progress Notes (Signed)
MD reviewed cbc and cmet , ok to treat despite counts.

## 2021-06-17 NOTE — Progress Notes (Signed)
Patient is doing much better off the Xeloda. She will start FOLFOX today.   Oncology Nurse Navigator Documentation     06/17/2021   12:30 PM  Oncology Nurse Navigator Flowsheets  Navigator Follow Up Date: 07/02/2021  Navigator Follow Up Reason: Follow-up Appointment;Chemotherapy  Navigator Location [facility]  Navigator Encounter Type Treatment;Appt/Treatment Plan Review  Patient Visit Type MedOnc  Treatment Phase Active Tx  Barriers/Navigation Needs Coordination of Care;Education  Interventions Psycho-Social Support  Acuity Level 2-Minimal Needs (1-2 Barriers Identified)  Support Groups/Services Friends and Family  Time Spent with Patient 15

## 2021-06-18 ENCOUNTER — Other Ambulatory Visit: Payer: Self-pay

## 2021-06-19 ENCOUNTER — Inpatient Hospital Stay: Payer: BC Managed Care – PPO

## 2021-06-19 VITALS — BP 103/63 | HR 94 | Temp 98.3°F | Resp 19

## 2021-06-19 DIAGNOSIS — C20 Malignant neoplasm of rectum: Secondary | ICD-10-CM

## 2021-06-19 DIAGNOSIS — Z5111 Encounter for antineoplastic chemotherapy: Secondary | ICD-10-CM | POA: Diagnosis not present

## 2021-06-19 DIAGNOSIS — Z7901 Long term (current) use of anticoagulants: Secondary | ICD-10-CM | POA: Diagnosis not present

## 2021-06-19 DIAGNOSIS — Z452 Encounter for adjustment and management of vascular access device: Secondary | ICD-10-CM | POA: Diagnosis not present

## 2021-06-19 DIAGNOSIS — I82432 Acute embolism and thrombosis of left popliteal vein: Secondary | ICD-10-CM | POA: Diagnosis not present

## 2021-06-19 DIAGNOSIS — Z87891 Personal history of nicotine dependence: Secondary | ICD-10-CM | POA: Diagnosis not present

## 2021-06-19 MED ORDER — SODIUM CHLORIDE 0.9% FLUSH: 3.0000 mL | INTRAVENOUS | Status: DC | PRN

## 2021-06-19 MED ORDER — SODIUM CHLORIDE 0.9% FLUSH
10.0000 mL | INTRAVENOUS | Status: DC | PRN
  Administered 2021-06-19: 10 mL

## 2021-06-19 MED ORDER — HEPARIN SOD (PORK) LOCK FLUSH 100 UNIT/ML IV SOLN
500.0000 [IU] | Freq: Once | INTRAVENOUS | Status: AC | PRN
  Administered 2021-06-19: 500 [IU]

## 2021-06-20 ENCOUNTER — Other Ambulatory Visit: Payer: Self-pay | Admitting: Hematology & Oncology

## 2021-06-20 DIAGNOSIS — C20 Malignant neoplasm of rectum: Secondary | ICD-10-CM | POA: Diagnosis not present

## 2021-06-21 ENCOUNTER — Encounter: Payer: Self-pay | Admitting: Hematology & Oncology

## 2021-06-21 ENCOUNTER — Other Ambulatory Visit: Payer: Self-pay | Admitting: *Deleted

## 2021-06-21 ENCOUNTER — Other Ambulatory Visit (HOSPITAL_COMMUNITY): Payer: Self-pay

## 2021-06-21 ENCOUNTER — Telehealth: Payer: Self-pay | Admitting: *Deleted

## 2021-06-21 MED ORDER — NYSTATIN 100000 UNIT/ML MT SUSP
5.0000 mL | Freq: Four times a day (QID) | OROMUCOSAL | 1 refills | Status: DC | PRN
  Filled 2021-06-21: qty 240, 12d supply, fill #0

## 2021-06-21 MED FILL — Potassium Chloride Microencapsulated Crys ER Tab 20 mEq: ORAL | 20 days supply | Qty: 60 | Fill #0 | Status: AC

## 2021-06-21 NOTE — Telephone Encounter (Addendum)
Call received from Janice Lucas to notify Dr. Marin Olp that patient has new mouth/throat sores and has no medicine for it.  Dr. Marin Olp notified and order for Magic Mouthwash sent to [facility] per order of Dr. Orpah Melter notified of MM prescription and is appreciative of assistance.

## 2021-06-24 ENCOUNTER — Emergency Department (HOSPITAL_COMMUNITY): Payer: BC Managed Care – PPO

## 2021-06-24 ENCOUNTER — Emergency Department (HOSPITAL_COMMUNITY)
Admission: EM | Admit: 2021-06-24 | Discharge: 2021-06-24 | Discharge status: Against Medical Advice | Payer: BC Managed Care – PPO | Attending: Emergency Medicine | Admitting: Emergency Medicine

## 2021-06-24 ENCOUNTER — Encounter (HOSPITAL_BASED_OUTPATIENT_CLINIC_OR_DEPARTMENT_OTHER): Payer: Self-pay | Admitting: Emergency Medicine

## 2021-06-24 ENCOUNTER — Other Ambulatory Visit: Payer: Self-pay

## 2021-06-24 ENCOUNTER — Emergency Department (HOSPITAL_BASED_OUTPATIENT_CLINIC_OR_DEPARTMENT_OTHER)
Admission: EM | Admit: 2021-06-24 | Discharge: 2021-06-24 | Disposition: A | Discharge status: Home / Self Care | Payer: BC Managed Care – PPO | Source: Home / Self Care | Attending: Emergency Medicine | Admitting: Emergency Medicine

## 2021-06-24 ENCOUNTER — Encounter (HOSPITAL_COMMUNITY): Payer: Self-pay

## 2021-06-24 DIAGNOSIS — Z85048 Personal history of other malignant neoplasm of rectum, rectosigmoid junction, and anus: Secondary | ICD-10-CM | POA: Insufficient documentation

## 2021-06-24 DIAGNOSIS — E876 Hypokalemia: Secondary | ICD-10-CM | POA: Insufficient documentation

## 2021-06-24 DIAGNOSIS — Z7982 Long term (current) use of aspirin: Secondary | ICD-10-CM | POA: Insufficient documentation

## 2021-06-24 DIAGNOSIS — R569 Unspecified convulsions: Secondary | ICD-10-CM | POA: Insufficient documentation

## 2021-06-24 DIAGNOSIS — R55 Syncope and collapse: Secondary | ICD-10-CM | POA: Insufficient documentation

## 2021-06-24 DIAGNOSIS — R110 Nausea: Secondary | ICD-10-CM | POA: Insufficient documentation

## 2021-06-24 DIAGNOSIS — Z5321 Procedure and treatment not carried out due to patient leaving prior to being seen by health care provider: Secondary | ICD-10-CM | POA: Insufficient documentation

## 2021-06-24 DIAGNOSIS — R42 Dizziness and giddiness: Secondary | ICD-10-CM | POA: Diagnosis not present

## 2021-06-24 LAB — COMPREHENSIVE METABOLIC PANEL
ALT: 26 U/L (ref 0–44)
AST: 22 U/L (ref 15–41)
Albumin: 3.3 g/dL — ABNORMAL LOW (ref 3.5–5.0)
Alkaline Phosphatase: 85 U/L (ref 38–126)
Anion gap: 14 (ref 5–15)
BUN: 14 mg/dL (ref 6–20)
CO2: 22 mmol/L (ref 22–32)
Calcium: 8.7 mg/dL — ABNORMAL LOW (ref 8.9–10.3)
Chloride: 98 mmol/L (ref 98–111)
Creatinine, Ser: 0.83 mg/dL (ref 0.44–1.00)
GFR, Estimated: >60 mL/min (ref >60)
Glucose, Bld: 136 mg/dL — ABNORMAL HIGH (ref 70–99)
Potassium: 2.4 mmol/L — CL (ref 3.5–5.1)
Sodium: 134 mmol/L — ABNORMAL LOW (ref 135–145)
Total Bilirubin: 0.9 mg/dL (ref 0.3–1.2)
Total Protein: 7.1 g/dL (ref 6.5–8.1)

## 2021-06-24 LAB — CBC WITH DIFFERENTIAL/PLATELET
Abs Immature Granulocytes: 0.07 10*3/uL (ref 0.00–0.07)
Basophils Absolute: 0 10*3/uL (ref 0.0–0.1)
Basophils Relative: 0 %
Eosinophils Absolute: 0.5 10*3/uL (ref 0.0–0.5)
Eosinophils Relative: 4 %
HCT: 38.3 % (ref 36.0–46.0)
Hemoglobin: 13.2 g/dL (ref 12.0–15.0)
Immature Granulocytes: 1 %
Lymphocytes Relative: 14 %
Lymphs Abs: 1.6 10*3/uL (ref 0.7–4.0)
MCH: 29.4 pg (ref 26.0–34.0)
MCHC: 34.5 g/dL (ref 30.0–36.0)
MCV: 85.3 fL (ref 80.0–100.0)
Monocytes Absolute: 0.5 10*3/uL (ref 0.1–1.0)
Monocytes Relative: 4 %
Neutro Abs: 9.1 10*3/uL — ABNORMAL HIGH (ref 1.7–7.7)
Neutrophils Relative %: 77 %
Platelets: 215 10*3/uL (ref 150–400)
RBC: 4.49 MIL/uL (ref 3.87–5.11)
RDW: 14.7 % (ref 11.5–15.5)
WBC: 11.7 10*3/uL — ABNORMAL HIGH (ref 4.0–10.5)
nRBC: 0 % (ref 0.0–0.2)

## 2021-06-24 LAB — MAGNESIUM: Magnesium: 2.2 mg/dL (ref 1.7–2.4)

## 2021-06-24 MED ORDER — POTASSIUM CHLORIDE 10 MEQ/100ML IV SOLN
10.0000 meq | INTRAVENOUS | Status: AC
  Administered 2021-06-24 (×3): 10 meq via INTRAVENOUS
  Filled 2021-06-24 (×3): qty 100

## 2021-06-24 MED ORDER — SODIUM CHLORIDE 0.9 % IV SOLN
INTRAVENOUS | Status: DC | PRN
  Administered 2021-06-24: 10 mL/h via INTRAVENOUS

## 2021-06-24 MED ORDER — POTASSIUM CHLORIDE CRYS ER 20 MEQ PO TBCR
40.0000 meq | EXTENDED_RELEASE_TABLET | Freq: Once | ORAL | Status: AC
  Administered 2021-06-24: 40 meq via ORAL
  Filled 2021-06-24: qty 2

## 2021-06-24 NOTE — ED Provider Triage Note (Signed)
Emergency Medicine Provider Triage Evaluation Note  Janice Lucas , a 56 y.o. female  was evaluated in triage.  Pt complains of nausea and dizziness without vomiting but since subsided.  Wife reports questionable seizure activity prior to EMS arrival-- twisted face, stiff as a board, patient does not recall event and was confused for about 5 mins afterwards.  AAOx4 on EMS arrival, ambulatory.  Hx rectal cancer, last chemo 06/20/21 (first time receiving Folfox).  Denies abdominal pain.  Review of Systems  Positive: Possible seizure activity Negative: fever  Physical Exam  BP 111/72   Pulse 87   Temp 97.9 F (36.6 C) (Oral)   Resp 18   LMP 01/29/2011   SpO2 98%   Gen:   Awake, no distress, denies pain Resp:  Normal effort  MSK:   Moves extremities without difficulty  Other:  AAOx3, moving extremities well, speech is clear and goal oriented  Medical Decision Making  Medically screening exam initiated at 6:30 AM.  Appropriate orders placed.  Janice Lucas was informed that the remainder of the evaluation will be completed by another provider, this initial triage assessment does not replace that evaluation, and the importance of remaining in the ED until their evaluation is complete.  Possible seizure.  AAOx3 now, no focal deficits.  Does not recall reported seizure activity.  Denies abdominal pain now.  Given no hx of seizures in the past, will check EKG, labs, CT head.   Janice Pickett, PA-C 06/24/21 [phone]

## 2021-06-24 NOTE — ED Provider Notes (Signed)
[facility] Provider Note   CSN: [id number] Arrival date & time: 06/24/21  0750     History  Chief Complaint  Patient presents with   Loss of Consciousness    [facility] is a 56 y.o. female.   Loss of Consciousness Associated symptoms: no fever    Patient presents to the ED for evaluation of loss of consciousness with stiffening and unresponsiveness.  Patient does have history of rectal cancer, hypokalemia, migraine, mixed hyperlipidemia, depression.  Patient started chemotherapy for colorectal cancer 6 weeks ago.  Patient states [facility] has not been having any issues with vomiting or diarrhea.  When [facility] woke up this morning [facility] was not quite feeling well.  [facility] sat down on the bed and the patient's wife states [facility] went unresponsive.  Patient was stiff and her eyes closed back.  Her face were tight.  [facility] was unresponsive for about 4 minutes.  Patient does recall EMS arriving.  [facility] was alert and awake and answering questions at that time.  Family is concerned that Janice Lucas may have had a seizure.  No history of seizures.  Patient did go to [facility] emergency room and had screening evaluation performed.  Patient left due to the long wait in the ED.    Home Medications Prior to Admission medications   Medication Sig Start Date End Date Taking? Authorizing Provider  acetaminophen (TYLENOL) 500 MG tablet Take 500 mg by mouth every 6 (six) hours as needed.    [provider]  acetaZOLAMIDE ER (DIAMOX) 500 MG capsule Take 2 capsules (1,000 mg total) by mouth 2 (two) times daily. 10/01/20   Cameron Sprang, MD  albuterol (VENTOLIN HFA) 108 (90 Base) MCG/ACT inhaler Inhale 2 puffs into the lungs every 6 (six) hours as needed for wheezing or shortness of breath. Patient not taking: Reported on 05/21/2021 11/30/19   Copland, Gay Filler, MD  Ascorbic Acid (VITAMIN C) 1000 MG tablet Take 1,000 mg by mouth daily.    [provider]  aspirin EC 81 MG tablet  Take 81 mg by mouth daily.    [provider]  atorvastatin (LIPITOR) 20 MG tablet Take 1 tablet  by mouth daily. 02/14/21   Copland, Gay Filler, MD  Biotin 10000 MCG TABS Take by mouth daily.    [provider]  Calcium Carb-Cholecalciferol (CALCIUM 1000 + D PO) Take by mouth 2 (two) times daily.    [provider]  capecitabine (XELODA) 500 MG tablet Take 4 tablets (2,000 mg total) by mouth 2 (two) times daily after a meal. Take twice a day for 14 days on and 7 days off. Patient not taking: Reported on 06/04/2021 04/26/21   Volanda Napoleon, MD  cholecalciferol (VITAMIN D3) 25 MCG (1000 UT) tablet Take 1,000 Units by mouth daily.    [provider]  dexamethasone (DECADRON) 4 MG tablet Take 4 mg by mouth 2 (two) times daily with a meal. TAKE 2 TABLETS BY MOUTH EVERY DAY (START THE DAY AFTER CHEMOTHERAPY FOR 2 DAYS) TAKE WITH FOOD    [provider]  dicyclomine (BENTYL) 10 MG capsule Take 1 capsule (10 mg total) by mouth 4 (four) times daily -  before meals and at bedtime. Patient not taking: Reported on 06/04/2021 05/08/21   Volanda Napoleon, MD  fluticasone [facility]) 50 MCG/ACT nasal spray Place 2 sprays into both nostrils daily. Patient taking differently: Place 2 sprays into both nostrils daily. As needed 02/11/21   Copland,  Gay Filler, MD  lisinopril-hydrochlorothiazide (ZESTORETIC) 20-25 MG tablet Take 1 tablet by mouth daily. 02/11/21   Copland, Gay Filler, MD  magic mouthwash (nystatin, diphenhydrAMINE, alum & mag hydroxide) suspension mixture Take 5 mLs by mouth 4 (four) times daily as needed for mouth pain. 06/21/21   Volanda Napoleon, MD  nortriptyline (PAMELOR) 50 MG capsule TAKE 2 CAPSULES BY MOUTH EVERY NIGHT AS DIRECTED 10/01/20   Cameron Sprang, MD  omeprazole (PRILOSEC) 20 MG capsule Take 1 capsule (20 mg total) by mouth daily. 02/11/21   Copland, Gay Filler, MD  ondansetron (ZOFRAN) 8 MG tablet Take by mouth every 8 (eight) hours as needed for nausea  or vomiting.    [provider]  potassium chloride SA (KLOR-CON M) 20 MEQ tablet TAKE 2 TABLETS BY MOUTH TWICE DAILY FOR 10 DAYS... THEN ONCE DAILY 06/20/21   Volanda Napoleon, MD  sertraline (ZOLOFT) 25 MG tablet Take 12.5 mg by mouth daily.    [provider]  sucralfate (CARAFATE) 1 GM/10ML suspension Take 10 mLs (1 g total) by mouth 4 (four) times daily. Patient not taking: Reported on 06/17/2021 05/13/21   Volanda Napoleon, MD  topiramate (TOPAMAX) 100 MG tablet Take 1 tablet (100 mg total) by mouth 2 (two) times daily. 10/01/20   Cameron Sprang, MD  traMADol (ULTRAM) 50 MG tablet Take 1 tablet (50 mg total) by mouth every 8 (eight) hours as needed. Patient not taking: Reported on 06/17/2021 05/13/21   Volanda Napoleon, MD  vitamin E 1000 UNIT capsule Take 1,000 Units by mouth daily.    [provider]  prochlorperazine (COMPAZINE) 10 MG tablet Take 1 tablet (10 mg total) by mouth every 6 (six) hours as needed (Nausea or vomiting). 04/29/21 06/04/21  Volanda Napoleon, MD      Allergies    Sulfa antibiotics and Neosporin  [neomycin-bacitracin zn-polymyx]    Review of Systems   Review of Systems  Constitutional:  Negative for fever.  Cardiovascular:  Positive for syncope.    Physical Exam Updated Vital Signs BP 101/65   Pulse 82   Temp 98.2 F (36.8 C) (Oral)   Resp 18   Ht 1.702 m ('5\' 7"'$ )   Wt 131.5 kg   LMP 01/29/2011   SpO2 95%   BMI 45.42 kg/m  Physical Exam Vitals and nursing note reviewed.  Constitutional:      General: [facility] is not in acute distress.    Appearance: [facility] is well-developed.  HENT:     Head: Normocephalic and atraumatic.     Right Ear: External ear normal.     Left Ear: External ear normal.  Eyes:     General: No scleral icterus.       Right eye: No discharge.        Left eye: No discharge.     Conjunctiva/sclera: Conjunctivae normal.  Neck:     Trachea: No tracheal deviation.  Cardiovascular:     Rate and Rhythm: Normal rate and  regular rhythm.  Pulmonary:     Effort: Pulmonary effort is normal. No respiratory distress.     Breath sounds: Normal breath sounds. No stridor. No wheezing or rales.  Abdominal:     General: Bowel sounds are normal. There is no distension.     Palpations: Abdomen is soft.     Tenderness: There is no abdominal tenderness. There is no guarding or rebound.  Musculoskeletal:        General: No tenderness or deformity.  Cervical back: Neck supple.  Skin:    General: Skin is warm and dry.     Findings: No rash.  Neurological:     General: No focal deficit present.     Mental Status: [facility] is alert.     Cranial Nerves: No cranial nerve deficit (no facial droop, extraocular movements intact, no slurred speech).     Sensory: No sensory deficit.     Motor: No abnormal muscle tone or seizure activity.     Coordination: Coordination normal.  Psychiatric:        Mood and Affect: Mood normal.     ED Results / Procedures / Treatments   Labs (all labs ordered are listed, but only abnormal results are displayed) Labs Reviewed  MAGNESIUM    EKG EKG Interpretation  Date/Time:  Monday June 24 2021 09:41:26 EDT Ventricular Rate:  77 PR Interval:  167 QRS Duration: 100 QT Interval:  392 QTC Calculation: 444 R Axis:   85 Text Interpretation: Sinus rhythm Low voltage, precordial leads No significant change since last tracing Confirmed by Dorie Rank [phone]) on 06/24/2021 9:44:10 AM  Radiology CT HEAD WO CONTRAST (5MM)  Result Date: 06/24/2021 CLINICAL DATA:  56 year old female with history of new onset of seizure. History of trauma. EXAM: CT HEAD WITHOUT CONTRAST TECHNIQUE: Contiguous axial images were obtained from the base of the skull through the vertex without intravenous contrast. RADIATION DOSE REDUCTION: This exam was performed according to the departmental dose-optimization program which includes automated exposure control, adjustment of the mA and/or kV according to patient size  and/or use of iterative reconstruction technique. COMPARISON:  Head CT 12/09/2009. FINDINGS: Brain: No evidence of acute infarction, hemorrhage, hydrocephalus, extra-axial collection or mass lesion/mass effect. Vascular: No hyperdense vessel or unexpected calcification. Skull: Normal. Negative for fracture or focal lesion. Sinuses/Orbits: No acute finding. Other: None. IMPRESSION: 1. No acute intracranial abnormalities. The appearance of the brain is normal. Electronically Signed   By: Vinnie Langton M.D.   On: 06/24/2021 07:13    Procedures Procedures    Medications Ordered in ED Medications  0.9 %  sodium chloride infusion (0 mLs Intravenous Stopped 06/24/21 1340)  potassium chloride SA (KLOR-CON M) CR tablet 40 mEq (40 mEq Oral Given 06/24/21 0947)  potassium chloride 10 mEq in 100 mL IVPB (0 mEq Intravenous Stopped 06/24/21 1321)    ED Course/ Medical Decision Making/ A&P Clinical Course as of 06/24/21 1355  Mon Jun 24, 2021  0837 White blood cell count slightly elevated.  No anemia noted [JK]  0838 Potassium level decreased at 2.4 [JK]  0856 Potassium level this morning decreased at 2.4.  Sodium was 134.  BUN and creatinine were normal. [JK]  0857 White blood cell count slightly elevated at 11.7 this morning but no anemia [JK]  0857 CT scan of the head performed this morning did not show any acute abnormalities [JK]  1012 Magnesium Normal [JK]    Clinical Course User Index [JK] Dorie Rank, MD                           Medical Decision Making Differential diagnosis included but not limited to anemia dehydration, cardiac dysrhythmia, seizure  Amount and/or Complexity of Data Reviewed External Data Reviewed: labs. Labs: ordered. Decision-making details documented in ED Course.    Details: Lab results reviewed from the visit to [facility] emergency room  Risk Prescription drug management. Drug therapy requiring intensive monitoring for toxicity. Risk Details: Patient  with  significant low potassium level.  Will require IV potassium replacement.  Patient is already taking oral potassium.  Her hypokalemia is felt to be related to her chemotherapy medications, may be related to her diamox.  Pt is currently on that medication   Patient presented to the ED for evaluation of a syncopal episode versus seizure.  Presentation most likely consistent with syncope.  Patient did not have a postictal state.  Patient's laboratory test performed earlier this morning at [facility] showed significant hypokalemia.  Patient is on Diamox likely causing her hypokalemia.  Patient states [facility] just started supplementation a couple days ago.  This point [facility] has been monitored in the ED during her potassium replacement and [facility] has not had any recurrent events.  Suspect syncopal event, possible dysrhythmia related to her hyperkalemia.  Patient has been given IV and oral potassium replacement.  [facility] has been monitored in the ED and no recurrent symptoms.  At this time I think [facility] is appropriate for discharge.  Will require close follow-up to have her potassium level rechecked.  Also recommend Janice Lucas discuss with her neurologist whether her Diamox needs to be adjusted.        Final Clinical Impression(s) / ED Diagnoses Final diagnoses:  Hypokalemia  Syncope, unspecified syncope type    Rx / DC Orders ED Discharge Orders     None         Dorie Rank, MD 06/24/21 1355

## 2021-06-24 NOTE — ED Triage Notes (Signed)
Pt states she went to bathroom this morning, wasn't feeling well.  Sat down on the bed.  Pts family states she went unresponsive and stiff, eyes half closed and fist tight.  Pt was out approximately 4 minutes.  They called ems, went to [place] but due to the wait, she came here.  Pt states she feels tired.  Pt started 6 weeks ago on chemo for colo-rectal cancer.  Currently pt is alert and oriented x 4.

## 2021-06-24 NOTE — ED Notes (Signed)
Pt visitor stated cannot wait long and will be leaving to go be seen at [place] where she will be seen immediately.

## 2021-06-24 NOTE — Discharge Instructions (Addendum)
Continue your current medications.  The acetazolamide that you take is the likely cause of the low potassium levels.  Follow-up with your doctor later this week to have your potassium level rechecked.  Follow-up with your neurologist as discussed.  Return as needed for recurrent or worsening symptoms

## 2021-06-24 NOTE — ED Triage Notes (Addendum)
Arrives EMS from home after waking up nauseated but denies vomiting.   Wife described "stiffness, inability to arouse", and a subjective postictal phase". No hx of seizures. Pt has no memory of event.   Last chemo received 06/20/21 for rectal cancer. Meds recently switched to "FolFox".

## 2021-06-25 ENCOUNTER — Telehealth: Payer: Self-pay | Admitting: Neurology

## 2021-06-25 DIAGNOSIS — H40053 Ocular hypertension, bilateral: Secondary | ICD-10-CM | POA: Diagnosis not present

## 2021-06-25 DIAGNOSIS — H05113 Granuloma of bilateral orbits: Secondary | ICD-10-CM | POA: Diagnosis not present

## 2021-06-25 NOTE — Telephone Encounter (Signed)
I have an opening on June 15, Thurs at 8:30am, pls have her come to the office, needs to be in-person visit, last in-person visit was in 2019, since then all visits have been virtual because she has been doing well, but since this is a new change, pls have her come in and have someone drive her. thanks

## 2021-06-25 NOTE — Telephone Encounter (Signed)
Appointment has been sent up front for scheduling

## 2021-06-25 NOTE — Telephone Encounter (Signed)
Patients wife called and stated that [facility]ice [facility] had a seizure and went to the ED yesterday.  They said it could be due to lack of potassium, she has cancer.  She stated they suggested an EEG.  [facility] had some questions and didn't know if [facility] needs to see the Dr.

## 2021-06-26 ENCOUNTER — Encounter: Payer: Self-pay | Admitting: *Deleted

## 2021-06-26 NOTE — Progress Notes (Signed)
Called to check on patient after her ED visit earlier this week. She is doing well and is back to work today. She states she took yesterday off to recover but felt good today. She believes she is eating and drinking adequately.   Patient says that someone from the office called her yesterday and said that Dr Marin Olp wanted to see her Friday, but no appointment was made.   Couldn't find anyone who made call. Spoke to Dr Marin Olp and he would like to see patient at her regularly scheduled appointment on 07/02/21.  Oncology Nurse Navigator Documentation     06/26/2021   10:00 AM  Oncology Nurse Navigator Flowsheets  Navigator Follow Up Date: 07/02/2021  Navigator Follow Up Reason: Follow-up Appointment;Chemotherapy  Production assistant, radio Encounter Type Telephone  Telephone Outgoing Call;Patient Update  Patient Visit Type MedOnc  Treatment Phase Active Tx  Barriers/Navigation Needs Coordination of Care;Education  Education Pain/ Symptom Management  Interventions Coordination of Care;Psycho-Social Support  Acuity Level 2-Minimal Needs (1-2 Barriers Identified)  Support Groups/Services Friends and Family  Time Spent with Patient 30

## 2021-06-27 ENCOUNTER — Ambulatory Visit: Payer: BC Managed Care – PPO | Admitting: Neurology

## 2021-06-27 ENCOUNTER — Encounter: Payer: Self-pay | Admitting: Neurology

## 2021-06-27 ENCOUNTER — Encounter: Payer: Self-pay | Admitting: *Deleted

## 2021-06-27 ENCOUNTER — Other Ambulatory Visit: Payer: Self-pay | Admitting: Oncology

## 2021-06-27 ENCOUNTER — Inpatient Hospital Stay: Payer: BC Managed Care – PPO

## 2021-06-27 ENCOUNTER — Other Ambulatory Visit (HOSPITAL_COMMUNITY): Payer: Self-pay

## 2021-06-27 ENCOUNTER — Ambulatory Visit: Payer: BC Managed Care – PPO | Admitting: Family Medicine

## 2021-06-27 ENCOUNTER — Telehealth: Payer: Self-pay | Admitting: *Deleted

## 2021-06-27 VITALS — BP 103/72 | HR 97 | Ht 67.0 in | Wt 289.6 lb

## 2021-06-27 DIAGNOSIS — R55 Syncope and collapse: Secondary | ICD-10-CM | POA: Diagnosis not present

## 2021-06-27 DIAGNOSIS — Z5111 Encounter for antineoplastic chemotherapy: Secondary | ICD-10-CM | POA: Diagnosis not present

## 2021-06-27 DIAGNOSIS — Z7901 Long term (current) use of anticoagulants: Secondary | ICD-10-CM | POA: Diagnosis not present

## 2021-06-27 DIAGNOSIS — Z87891 Personal history of nicotine dependence: Secondary | ICD-10-CM | POA: Diagnosis not present

## 2021-06-27 DIAGNOSIS — G932 Benign intracranial hypertension: Secondary | ICD-10-CM

## 2021-06-27 DIAGNOSIS — I82432 Acute embolism and thrombosis of left popliteal vein: Secondary | ICD-10-CM | POA: Diagnosis not present

## 2021-06-27 DIAGNOSIS — C20 Malignant neoplasm of rectum: Secondary | ICD-10-CM

## 2021-06-27 DIAGNOSIS — Z452 Encounter for adjustment and management of vascular access device: Secondary | ICD-10-CM | POA: Diagnosis not present

## 2021-06-27 LAB — CBC WITH DIFFERENTIAL (CANCER CENTER ONLY)
Abs Immature Granulocytes: 0.05 10*3/uL (ref 0.00–0.07)
Basophils Absolute: 0 10*3/uL (ref 0.0–0.1)
Basophils Relative: 0 %
Eosinophils Absolute: 0.4 10*3/uL (ref 0.0–0.5)
Eosinophils Relative: 4 %
HCT: 35.2 % — ABNORMAL LOW (ref 36.0–46.0)
Hemoglobin: 11.8 g/dL — ABNORMAL LOW (ref 12.0–15.0)
Immature Granulocytes: 1 %
Lymphocytes Relative: 21 %
Lymphs Abs: 1.9 10*3/uL (ref 0.7–4.0)
MCH: 29.1 pg (ref 26.0–34.0)
MCHC: 33.5 g/dL (ref 30.0–36.0)
MCV: 86.9 fL (ref 80.0–100.0)
Monocytes Absolute: 0.8 10*3/uL (ref 0.1–1.0)
Monocytes Relative: 9 %
Neutro Abs: 5.9 10*3/uL (ref 1.7–7.7)
Neutrophils Relative %: 65 %
Platelet Count: 158 10*3/uL (ref 150–400)
RBC: 4.05 MIL/uL (ref 3.87–5.11)
RDW: 15.7 % — ABNORMAL HIGH (ref 11.5–15.5)
WBC Count: 9.1 10*3/uL (ref 4.0–10.5)
nRBC: 0 % (ref 0.0–0.2)

## 2021-06-27 MED ORDER — HEPARIN SOD (PORK) LOCK FLUSH 100 UNIT/ML IV SOLN
500.0000 [IU] | Freq: Once | INTRAVENOUS | Status: AC
  Administered 2021-06-27: 500 [IU] via INTRAVENOUS

## 2021-06-27 MED ORDER — ACETAZOLAMIDE ER 500 MG PO CP12
ORAL_CAPSULE | ORAL | 3 refills | Status: DC
  Filled 2021-06-27: qty 180, 90d supply, fill #0

## 2021-06-27 MED ORDER — TOPIRAMATE 100 MG PO TABS
100.0000 mg | ORAL_TABLET | Freq: Two times a day (BID) | ORAL | 3 refills | Status: DC
  Filled 2021-06-27 – 2021-09-02 (×2): qty 180, 90d supply, fill #0
  Filled 2021-11-25: qty 180, 90d supply, fill #1

## 2021-06-27 MED ORDER — SODIUM CHLORIDE 0.9% FLUSH
10.0000 mL | Freq: Once | INTRAVENOUS | Status: AC
  Administered 2021-06-27: 10 mL via INTRAVENOUS

## 2021-06-27 MED ORDER — NORTRIPTYLINE HCL 50 MG PO CAPS
ORAL_CAPSULE | ORAL | 3 refills | Status: DC
  Filled 2021-06-27 – 2021-11-06 (×2): qty 180, 90d supply, fill #0

## 2021-06-27 NOTE — Progress Notes (Signed)
NEUROLOGY FOLLOW UP OFFICE NOTE  Janice Lucas [id number] 05-03-1965  HISTORY OF PRESENT ILLNESS: I had the pleasure of seeing Janice Lucas in follow-up in the neurology clinic on 06/27/2021.  The patient was last seen 9 months ago. She has been followed for many years for pseudotumor cerebri and migraines, but presents today for a new symptom of loss of consciousness that occurred 06/24/2021. She is again accompanied by her significant other Aimee who helps supplement the history today.  Records and images were personally reviewed where available.  Since her last visit, she has started chemotherapy for colorectal cancer. She had chemotherapy 5 days prior to the episode, she was feeling fine when she woke up on 6/12, went to the bathroom to urinate, then after washing her hands she started feeling dizzy and nauseated. She told Aimee she felt nauseated and she had barely sat on the bed when she became stiff and fell back on the bed. Aimee reports she stopped breathing, eyes were fixed, not blinking. Aimee sat her up and she started gasping for breath. EMS arrived 2 minutes later and she took a second to give her name when asked. Aimee reports she had urinary incontinence, no tongue bite. She was noted to be cold, clammy, sweaty. She felt her usual self after the episode, Aimee notes she was really tired. No focal weakness. She was brought to the ER where bloodwork showed a K of 2.4, Na 134. BUN and creatinine normal. I personally reviewed head CT without contrast which was normal. EKG showed sinus rhythm. She and her spouse deny any other staring/unresponsive episodes or gaps in time. She denies any olfactory/gustatory hallucinations, focal numbness/tingling/weakness, myoclonic jerks. Since chemotherapy started, she has had paresthesias and coldness in both hands and feet. They report she had been eating and drinking fine prior to the event. She has been on acetazolamide '1000mg'$  BID, Topiramate '100mg'$  BID,  and nortriptyline '100mg'$  qhs for many years for pseudotumor cerebri and migraines. She reports she does not have that many headaches, about once a week, usually when she gets hungry. On her last visit with her eye doctor, she was told there was no papilledema noted, records requested for review. No further dizziness, vision changes, no falls. She had a normal birth and early development, no history of febrile convulsions, CNS infections, significant head injuries, family history of seizures.     History on Initial Assessment 02/08/2016: This is a pleasant 56 yo RH woman with a history of pseudotumor cerebri diagnosed in 2015. Records from [facility] were reviewed, she presented to her eye doctor for a routine exam in April 2015 with a 65-monthhistory of headaches thinking she may need new glasses. She was noted to have mild papilledema, right > left. At that time, headaches were behind her eyes and at the vertex, daily, worsened by bending, lifting, coughing, and sneezing. MRI and MRV brain were reported as normal. She had a lumbar puncture with opening pressure of 25 in the lateral position. They report problems with her LP, she had a post-spinal tap headache that initial blood patch did not help with. She was started on Topamax and Diamox, and nortriptyline was added for migraine prevention. She was doing very well on this regimen with rare headaches. Her neurologist Dr. KBaltazar Najjarleft the practice and she saw Dr. MErmalene Postinlast October, with note of improvement in eye exam from last visit, no evidence of papilledema, and rare headaches. They discussed reducing some of her medications, Topamax was  reduced to '100mg'$  daily. With reduction in Topamax, she noticed an increase in headaches, particularly in the morning. She then lost her insurance and was unable to obtain the Diamox. Without this, headaches became much worse. She describes them as diffuse sharp pain, with occasional photo and phonophobia when  severe, no nausea/vomiting. Bending over causes floaters in her vision. She has intermittent tinnitus (non-pulsatile). She feels her vision is getting worse, she is straining more to use the computer with blurry vision, no loss of vision. No driving difficulties with peripheral vision.    She has been back on the Diamox after seeing her PCP, currently on uptitration taking '500mg'$  BID, then increasing back to original dose of '1000mg'$  BID. She had some paresthesias on the Diamox, and found that bananas help with this. She denies any dysarthria/dysphagia, neck/back pain, bowel/bladder dysfunction, dizziness, no falls. She reports some numbness in the three fingers of her right hand, they feel cold suddenly ("like ice cubes") and hurt. 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     PAST MEDICAL HISTORY: Past Medical History:  Diagnosis Date   Allergy    SEASONAL - MILD   GERD (gastroesophageal reflux disease)    ON OMEPRAZOLE   Goals of care, counseling/discussion 04/26/2021   Hyperlipidemia    ON MEDS   Hypertension    ON MEDS   Rectal cancer ([place]) 04/26/2021   Sleep apnea    WEARS CPAP    MEDICATIONS: Current Outpatient Medications on File Prior to Visit  Medication Sig Dispense Refill   acetaminophen (TYLENOL) 500 MG tablet Take 500 mg by mouth every 6 (six) hours as needed.     acetaZOLAMIDE ER (DIAMOX) 500 MG capsule Take 2 capsules (1,000 mg total) by mouth 2 (two) times daily. 360 capsule 3   albuterol (VENTOLIN HFA) 108 (90 Base) MCG/ACT inhaler Inhale 2 puffs into the lungs every 6 (six) hours as needed for wheezing or shortness of breath. 1 each 0   Ascorbic Acid (VITAMIN C) 1000 MG tablet Take 1,000 mg by mouth daily.     aspirin EC 81 MG tablet Take 81 mg by mouth daily.     atorvastatin (LIPITOR) 20 MG tablet Take 1 tablet  by mouth daily. 90 tablet 3   Biotin 10000 MCG TABS Take by mouth daily.     Calcium Carb-Cholecalciferol (CALCIUM 1000  + D PO) Take by mouth 2 (two) times daily.     cholecalciferol (VITAMIN D3) 25 MCG (1000 UT) tablet Take 1,000 Units by mouth daily.     dexamethasone (DECADRON) 4 MG tablet Take 4 mg by mouth 2 (two) times daily with a meal. TAKE 2 TABLETS BY MOUTH EVERY DAY (START THE DAY AFTER CHEMOTHERAPY FOR 2 DAYS) TAKE WITH FOOD     dicyclomine (BENTYL) 10 MG capsule Take 1 capsule (10 mg total) by mouth 4 (four) times daily -  before meals and at bedtime. 90 capsule 1   fluticasone (FLONASE) 50 MCG/ACT nasal spray Place 2 sprays into both nostrils daily. (Patient taking differently: Place 2 sprays into both nostrils daily. As needed) 16 g 6   lisinopril-hydrochlorothiazide (ZESTORETIC) 20-25 MG tablet Take 1 tablet by mouth daily. 90 tablet 3   magic mouthwash (nystatin, diphenhydrAMINE, alum & mag hydroxide) suspension mixture Take 5 mLs by mouth 4 (four) times daily as needed for mouth pain. 240 mL 1   nortriptyline (PAMELOR) 50 MG capsule TAKE 2 CAPSULES BY MOUTH EVERY NIGHT AS DIRECTED  180 capsule 3   omeprazole (PRILOSEC) 20 MG capsule Take 1 capsule (20 mg total) by mouth daily. 90 capsule 3   ondansetron (ZOFRAN) 8 MG tablet Take by mouth every 8 (eight) hours as needed for nausea or vomiting.     potassium chloride SA (KLOR-CON M) 20 MEQ tablet TAKE 2 TABLETS BY MOUTH TWICE DAILY FOR 10 DAYS... THEN ONCE DAILY 60 tablet 0   sertraline (ZOLOFT) 25 MG tablet Take 12.5 mg by mouth daily.     topiramate (TOPAMAX) 100 MG tablet Take 1 tablet (100 mg total) by mouth 2 (two) times daily. 180 tablet 3   vitamin E 1000 UNIT capsule Take 1,000 Units by mouth daily.     capecitabine (XELODA) 500 MG tablet Take 4 tablets (2,000 mg total) by mouth 2 (two) times daily after a meal. Take twice a day for 14 days on and 7 days off. (Patient not taking: Reported on 06/04/2021) 112 tablet 3   [DISCONTINUED] prochlorperazine (COMPAZINE) 10 MG tablet Take 1 tablet (10 mg total) by mouth every 6 (six) hours as needed (Nausea  or vomiting). 30 tablet 1   No current facility-administered medications on file prior to visit.    ALLERGIES: Allergies  Allergen Reactions   Sulfa Antibiotics Other (See Comments)    Acts "goofy"   Neosporin  [Neomycin-Bacitracin Zn-Polymyx] Rash    FAMILY HISTORY: Family History  Problem Relation Age of Onset   Prostate cancer Father        METS TO LYMPH NODES AND BLADDER   Diabetes Maternal Aunt    Diabetes Maternal Uncle    Colon cancer Neg Hx    Colon polyps Neg Hx    Esophageal cancer Neg Hx    Rectal cancer Neg Hx    Stomach cancer Neg Hx     SOCIAL HISTORY: Social History   Socioeconomic History   Marital status: Married    Spouse name: Not on file   Number of children: Not on file   Years of education: Not on file   Highest education level: Not on file  Occupational History   Not on file  Tobacco Use   Smoking status: Former    Packs/day: 1.00    Years: 20.00    Total pack years: 20.00    Types: Cigarettes   Smokeless tobacco: Current   Tobacco comments:    VAPES   Vaping Use   Vaping Use: Every day   Substances: Nicotine, Flavoring  Substance and Sexual Activity   Alcohol use: No    Comment: occassionally   Drug use: Not Currently   Sexual activity: Not Currently  Other Topics Concern   Not on file  Social History Narrative   Right Handed   Lives in a one story home   Drinks a big cup of coffee daily   Social Determinants of Health   Financial Resource Strain: Not on file  Food Insecurity: Not on file  Transportation Needs: Not on file  Physical Activity: Not on file  Stress: Not on file  Social Connections: Not on file  Intimate Partner Violence: Not on file     PHYSICAL EXAM: Vitals:   06/27/21 0818  BP: 103/72  Pulse: 97  SpO2: 98%   General: No acute distress Head:  Normocephalic/atraumatic Skin/Extremities: No rash, no edema Neurological Exam: alert and awake. No aphasia or dysarthria. Fund of knowledge is  appropriate.  Attention and concentration are normal.   Cranial nerves: Pupils equal, round. Extraocular movements intact  with no nystagmus. Visual fields full.  No facial asymmetry.  Motor: Bulk and tone normal, muscle strength 5/5 throughout with no pronator drift. Sensation intact to cold, pin, vibration sense. Reflexes +1 throughout. Finger to nose testing intact.  Gait narrow-based and steady,no ataxia.  Romberg negative.   IMPRESSION: This is a pleasant 56 yo RH woman with a history of migraines and pseudotumor cerebri, presenting after an episode of loss of consciousness with body stiffening last 06/24/2021. Symptoms suggestive of syncope, less likely seizure. Her neurological exam today is normal. She is being treated for colorectal cancer, and although brain metastasis from colorectal cancer is uncommon, since this is a change in symptoms, we discussed doing an MRI brain with and without contrast to assess for underlying structural abnormality and scheduling an EEG. We discussed hypokalemia, she will reduce acetazolamide to '500mg'$  in AM, '1000mg'$  in PM for a week, then '500mg'$  BID. She is scheduled to have repeat K level today. Monitor for any headache/vision changes. Continue Topiramate '100mg'$  BID and nortriptyline '100mg'$  qhs. We discussed [place] driving laws. Follow-up as scheduled in September, call for any changes.    Thank you for allowing me to participate in her care.  Please do not hesitate to call for any questions or concerns.    Ellouise Newer, M.D.   CC: Dr. Lorelei Pont

## 2021-06-27 NOTE — Progress Notes (Unsigned)
Critical lab value of 2.6 potassium called to, read back by and verified with Thane Edu, rn @ 1550 by Martina Sinner on 06/27/2021

## 2021-06-27 NOTE — Patient Instructions (Signed)

## 2021-06-27 NOTE — Patient Instructions (Signed)
Good to see you doing better.  Schedule MRI brain with and without contrast  2. Schedule routine EEG  3. Reduce Diamox '500mg'$ : Take 1 tablet in AM, 2 tablets in PM for 1 week, then reduce to 1 tablet twice a day. Let me know if any change in symptoms  4. Repeat potassium level as scheduled  5. Continue Topamax '100mg'$  twice a day and Nortriptyline '100mg'$  every night.  6. Follow-up as scheduled in September, call for any changes

## 2021-06-27 NOTE — Telephone Encounter (Signed)
Dr. Marin Olp notified of potassium-2.7.  Orders received for pt to get four runs (40 meq) of IV potassium tomorrow.  Message sent to scheduling.

## 2021-06-28 ENCOUNTER — Inpatient Hospital Stay: Payer: BC Managed Care – PPO

## 2021-06-28 VITALS — BP 135/66 | HR 75 | Temp 97.8°F | Resp 18

## 2021-06-28 DIAGNOSIS — Z5111 Encounter for antineoplastic chemotherapy: Secondary | ICD-10-CM | POA: Diagnosis not present

## 2021-06-28 DIAGNOSIS — Z7901 Long term (current) use of anticoagulants: Secondary | ICD-10-CM | POA: Diagnosis not present

## 2021-06-28 DIAGNOSIS — Z452 Encounter for adjustment and management of vascular access device: Secondary | ICD-10-CM | POA: Diagnosis not present

## 2021-06-28 DIAGNOSIS — E876 Hypokalemia: Secondary | ICD-10-CM

## 2021-06-28 DIAGNOSIS — C20 Malignant neoplasm of rectum: Secondary | ICD-10-CM | POA: Diagnosis not present

## 2021-06-28 DIAGNOSIS — Z87891 Personal history of nicotine dependence: Secondary | ICD-10-CM | POA: Diagnosis not present

## 2021-06-28 DIAGNOSIS — I82432 Acute embolism and thrombosis of left popliteal vein: Secondary | ICD-10-CM | POA: Diagnosis not present

## 2021-06-28 MED ORDER — HEPARIN SOD (PORK) LOCK FLUSH 100 UNIT/ML IV SOLN
500.0000 [IU] | Freq: Once | INTRAVENOUS | Status: AC
  Administered 2021-06-28: 500 [IU] via INTRAVENOUS

## 2021-06-28 MED ORDER — SODIUM CHLORIDE 0.9 % IV SOLN
40.0000 meq | Freq: Once | INTRAVENOUS | Status: AC
  Administered 2021-06-28: 40 meq via INTRAVENOUS
  Filled 2021-06-28: qty 20

## 2021-06-28 MED ORDER — SODIUM CHLORIDE 0.9 % IV SOLN: INTRAVENOUS | Status: DC

## 2021-06-28 MED ORDER — SODIUM CHLORIDE 0.9% FLUSH
10.0000 mL | Freq: Once | INTRAVENOUS | Status: AC
  Administered 2021-06-28: 10 mL via INTRAVENOUS

## 2021-06-28 NOTE — Patient Instructions (Addendum)
Hypokalemia Hypokalemia means that the amount of potassium in the blood is lower than normal. Potassium is a mineral (electrolyte) that helps regulate the amount of fluid in the body. It also stimulates muscle tightening (contraction) and helps nerves work properly. Normally, most of the body's potassium is inside cells, and only a very small amount is in the blood. Because the amount in the blood is so small, minor changes to potassium levels in the blood can be life-threatening. What are the causes? This condition may be caused by: Antibiotic medicine. Diarrhea or vomiting. Taking too much of a medicine that helps you have a bowel movement (laxative) can cause diarrhea and lead to hypokalemia. Chronic kidney disease (CKD). Medicines that help the body get rid of excess fluid (diuretics). Eating disorders, such as anorexia or bulimia. Low magnesium levels in the body. Sweating a lot. What are the signs or symptoms? Symptoms of this condition include: Weakness. Constipation. Fatigue. Muscle cramps. Mental confusion. Skipped heartbeats or irregular heartbeat (palpitations). Tingling or numbness. How is this diagnosed? This condition is diagnosed with a blood test. How is this treated? This condition may be treated by: Taking potassium supplements. Adjusting the medicines that you take. Eating more foods that contain a lot of potassium. If your potassium level is very low, you may need to get potassium through an IV and be monitored in the hospital. Follow these instructions at home: Eating and drinking  Eat a healthy diet. A healthy diet includes fresh fruits and vegetables, whole grains, healthy fats, and lean proteins. If told, eat more foods that contain a lot of potassium. These include: Nuts, such as peanuts and pistachios. Seeds, such as sunflower seeds and pumpkin seeds. Peas, lentils, and lima beans. Whole grain and bran cereals and breads. Fresh fruits and vegetables,  such as apricots, avocado, bananas, cantaloupe, kiwi, oranges, tomatoes, asparagus, and potatoes. Juices, such as orange, tomato, and prune. Lean meats, including fish. Milk and milk products, such as yogurt. General instructions Take over-the-counter and prescription medicines only as told by your health care provider. This includes vitamins, natural food products, and supplements. Keep all follow-up visits. This is important. Contact a health care provider if: You have weakness that gets worse. You feel your heart pounding or racing. You vomit. You have diarrhea. You have diabetes and you have trouble keeping your blood sugar in your target range. Get help right away if: You have chest pain. You have shortness of breath. You have vomiting or diarrhea that lasts for more than 2 days. You faint. These symptoms may be an emergency. Get help right away. Call 911. Do not wait to see if the symptoms will go away. Do not drive yourself to the hospital. Summary Hypokalemia means that the amount of potassium in the blood is lower than normal. This condition is diagnosed with a blood test. Hypokalemia may be treated by taking potassium supplements, adjusting the medicines that you take, or eating more foods that are high in potassium. If your potassium level is very low, you may need to get potassium through an IV and be monitored in the hospital. This information is not intended to replace advice given to you by your health care provider. Make sure you discuss any questions you have with your health care provider. Document Revised: 09/13/2020 Document Reviewed: 09/13/2020 Elsevier Patient Education  [place]. Potassium Acetate Injection What is this medication? POTASSIUM ACETATE (poe TASS i um ASa tate) prevents and treats low levels of potassium in your body.  Potassium plays an important role in maintaining the health of your kidneys, heart, muscles, and nervous system. This  medicine may be used for other purposes; ask your health care provider or pharmacist if you have questions. What should I tell my care team before I take this medication? They need to know if you have any of these conditions: Addison's disease Dehydration Diabetes Heart disease High levels of potassium in the blood Irregular heartbeat Kidney disease Liver disease Recent severe burn An unusual or allergic reaction to potassium, other medications, foods, dyes, or preservatives Pregnant or trying to get pregnant Breast-feeding How should I use this medication? This medication is for infusion into a vein. It is given in a hospital or clinic setting. Talk to your care team about the use of this medication in children. Special care may be needed. Overdosage: If you think you have taken too much of this medicine contact a poison control center or emergency room at once. NOTE: This medicine is only for you. Do not share this medicine with others. What if I miss a dose? This does not apply. What may interact with this medication? Do not take this medication with any of the following: Certain diuretics such as spironolactone, triamterene Eplerenone Sodium polystyrene sulfonate This medication may also interact with the following: Certain medications for blood pressure or heart disease like lisinopril, losartan, quinapril, valsartan Medications that lower your chance of fighting infection such as cyclosporine, tacrolimus NSAIDs, medications for pain and inflammation, like ibuprofen or naproxen Other potassium supplements Salt substitutes This list may not describe all possible interactions. Give your health care provider a list of all the medicines, herbs, non-prescription drugs, or dietary supplements you use. Also tell them if you smoke, drink alcohol, or use illegal drugs. Some items may interact with your medicine. What should I watch for while using this medication? Your condition will  be monitored carefully while you are receiving this medication. You may need blood work done while you are taking this medication. What side effects may I notice from receiving this medication? Side effects that you should report to your care team as soon as possible: Allergic reactions--skin rash, itching, hives, swelling of the face, lips, tongue, or throat High potassium level--muscle weakness, fast or irregular heartbeat Side effects that usually do not require medical attention (report these to your care team if they continue or are bothersome): Pain, redness, or irritation at injection site This list may not describe all possible side effects. Call your doctor for medical advice about side effects. You may report side effects to FDA at 1-800-FDA-1088. Where should I keep my medication? This medication is given in a hospital or clinic and will not be stored at home. NOTE: This sheet is a summary. It may not cover all possible information. If you have questions about this medicine, talk to your doctor, pharmacist, or health care provider.  2023 Elsevier/Gold Standard (2020-09-06 00:00:00)

## 2021-06-29 ENCOUNTER — Other Ambulatory Visit: Payer: Self-pay

## 2021-06-29 ENCOUNTER — Encounter (HOSPITAL_BASED_OUTPATIENT_CLINIC_OR_DEPARTMENT_OTHER): Payer: Self-pay | Admitting: Emergency Medicine

## 2021-06-29 ENCOUNTER — Emergency Department (HOSPITAL_BASED_OUTPATIENT_CLINIC_OR_DEPARTMENT_OTHER)
Admission: EM | Admit: 2021-06-29 | Discharge: 2021-06-29 | Disposition: A | Discharge status: Home / Self Care | Payer: BC Managed Care – PPO | Attending: Emergency Medicine | Admitting: Emergency Medicine

## 2021-06-29 DIAGNOSIS — M79662 Pain in left lower leg: Secondary | ICD-10-CM | POA: Diagnosis not present

## 2021-06-29 DIAGNOSIS — Z85038 Personal history of other malignant neoplasm of large intestine: Secondary | ICD-10-CM | POA: Insufficient documentation

## 2021-06-29 DIAGNOSIS — M7989 Other specified soft tissue disorders: Secondary | ICD-10-CM

## 2021-06-29 DIAGNOSIS — E876 Hypokalemia: Secondary | ICD-10-CM | POA: Diagnosis not present

## 2021-06-29 DIAGNOSIS — L03116 Cellulitis of left lower limb: Secondary | ICD-10-CM | POA: Diagnosis not present

## 2021-06-29 LAB — BASIC METABOLIC PANEL
Anion gap: 9 (ref 5–15)
BUN: 9 mg/dL (ref 6–20)
CO2: 20 mmol/L — ABNORMAL LOW (ref 22–32)
Calcium: 8.2 mg/dL — ABNORMAL LOW (ref 8.9–10.3)
Chloride: 107 mmol/L (ref 98–111)
Creatinine, Ser: 0.82 mg/dL (ref 0.44–1.00)
GFR, Estimated: >60 mL/min (ref >60)
Glucose, Bld: 155 mg/dL — ABNORMAL HIGH (ref 70–99)
Potassium: 2.9 mmol/L — ABNORMAL LOW (ref 3.5–5.1)
Sodium: 136 mmol/L (ref 135–145)

## 2021-06-29 LAB — CBC WITH DIFFERENTIAL/PLATELET
Abs Immature Granulocytes: 0.03 10*3/uL (ref 0.00–0.07)
Basophils Absolute: 0.1 10*3/uL (ref 0.0–0.1)
Basophils Relative: 1 %
Eosinophils Absolute: 0.5 10*3/uL (ref 0.0–0.5)
Eosinophils Relative: 6 %
HCT: 36.6 % (ref 36.0–46.0)
Hemoglobin: 12.1 g/dL (ref 12.0–15.0)
Immature Granulocytes: 0 %
Lymphocytes Relative: 22 %
Lymphs Abs: 1.9 10*3/uL (ref 0.7–4.0)
MCH: 29.2 pg (ref 26.0–34.0)
MCHC: 33.1 g/dL (ref 30.0–36.0)
MCV: 88.2 fL (ref 80.0–100.0)
Monocytes Absolute: 0.6 10*3/uL (ref 0.1–1.0)
Monocytes Relative: 7 %
Neutro Abs: 5.6 10*3/uL (ref 1.7–7.7)
Neutrophils Relative %: 64 %
Platelets: 111 10*3/uL — ABNORMAL LOW (ref 150–400)
RBC: 4.15 MIL/uL (ref 3.87–5.11)
RDW: 16.5 % — ABNORMAL HIGH (ref 11.5–15.5)
WBC: 8.6 10*3/uL (ref 4.0–10.5)
nRBC: 0 % (ref 0.0–0.2)

## 2021-06-29 LAB — MAGNESIUM: Magnesium: 1.9 mg/dL (ref 1.7–2.4)

## 2021-06-29 MED ORDER — CEPHALEXIN 500 MG PO CAPS: 500.0000 mg | ORAL_CAPSULE | Freq: Three times a day (TID) | ORAL | 0 refills | Status: AC

## 2021-06-29 MED ORDER — POTASSIUM CHLORIDE CRYS ER 20 MEQ PO TBCR
40.0000 meq | EXTENDED_RELEASE_TABLET | Freq: Once | ORAL | Status: AC
  Administered 2021-06-29: 40 meq via ORAL
  Filled 2021-06-29: qty 2

## 2021-06-29 MED ORDER — APIXABAN 2.5 MG PO TABS
10.0000 mg | ORAL_TABLET | Freq: Once | ORAL | Status: AC
Start: 2021-06-29 — End: 2021-06-29
  Administered 2021-06-29: 10 mg via ORAL
  Filled 2021-06-29 (×2): qty 4

## 2021-06-29 MED ORDER — CEPHALEXIN 500 MG PO CAPS: 500.0000 mg | ORAL_CAPSULE | Freq: Three times a day (TID) | ORAL | 0 refills | Status: DC

## 2021-06-29 NOTE — Discharge Instructions (Addendum)
We discussed DVT.  We do not have ultrasound to evaluate for this tonight.  I have placed the order.  I have attached the phone number below.  Please call this phone number to schedule this ultrasound study for tomorrow.  You received your first dose of blood thinning medication tonight.  Your potassium was slightly low at 2.9.  You received a dose of potassium prior to discharge.  You state you take potassium supplement twice daily.  For the next 4 days please increase this to 3 times daily and then decrease back down to 2 times a day.  Have this rechecked in 4 days to determine if you need to make additional changes to her potassium supplement.  He also had redness of your leg.  We discussed cellulitis.  I have sent antibiotic into the pharmacy for you.  For any worsening symptoms such as chest pain, shortness of breath or other concerning symptoms please return for evaluation.  Please call [phone] to schedule the ultrasound.

## 2021-06-29 NOTE — ED Triage Notes (Addendum)
Seen here for syncopal episode on Monday (6/12). Was given IVF and IVK+ during that visit. Had labs rechecked Friday, and was given more IVK+ due to K+ being 2.4. Left calf pain started Friday. Recently decreased her acetazolamide bc primary care thought that was affected her K+ levels. PCP sent pt here to be evaluated for DVT.

## 2021-06-29 NOTE — ED Provider Notes (Signed)
[facility] Provider Note   CSN: [id number] Arrival date & time: 06/29/21  2059     History  Chief Complaint  Patient presents with   Leg Pain    Janice Lucas is a 56 y.o. female.  56 year old female presents today for evaluation of leg pain and swelling onset yesterday.  She has active history of colorectal cancer.  Patient was recently evaluated in the emergency room for syncopal episode and found to have hypokalemia.  This was repleted and she was discharged.  She followed up with her oncologist and received another potassium infusion.  Since yesterday she has had leg pain and swelling.  She discussed this with her oncologist and was asked to come into the emergency room for evaluation.  She denies recent travel, recent surgery, increased periods of inactivity.  She is not on anticoagulation.  She denies chest pain, shortness of breath.  Denies recurrent syncopal episode or lightheadedness.  The history is provided by the patient. No language interpreter was used.       Home Medications Prior to Admission medications   Medication Sig Start Date End Date Taking? Authorizing Provider  acetaminophen (TYLENOL) 500 MG tablet Take 500 mg by mouth every 6 (six) hours as needed.    [provider]  acetaZOLAMIDE ER (DIAMOX) 500 MG capsule Take 1 capsule twice a day 06/27/21   Cameron Sprang, MD  albuterol (VENTOLIN HFA) 108 (90 Base) MCG/ACT inhaler Inhale 2 puffs into the lungs every 6 (six) hours as needed for wheezing or shortness of breath. 11/30/19   Copland, Gay Filler, MD  Ascorbic Acid (VITAMIN C) 1000 MG tablet Take 1,000 mg by mouth daily.    [provider]  aspirin EC 81 MG tablet Take 81 mg by mouth daily.    [provider]  atorvastatin (LIPITOR) 20 MG tablet Take 1 tablet  by mouth daily. 02/14/21   Copland, Gay Filler, MD  Biotin 10000 MCG TABS Take by mouth daily.    [provider]  Calcium  Carb-Cholecalciferol (CALCIUM 1000 + D PO) Take by mouth 2 (two) times daily.    [provider]  capecitabine (XELODA) 500 MG tablet Take 4 tablets (2,000 mg total) by mouth 2 (two) times daily after a meal. Take twice a day for 14 days on and 7 days off. Patient not taking: Reported on 06/04/2021 04/26/21   Volanda Napoleon, MD  cholecalciferol (VITAMIN D3) 25 MCG (1000 UT) tablet Take 1,000 Units by mouth daily.    [provider]  dexamethasone (DECADRON) 4 MG tablet Take 4 mg by mouth 2 (two) times daily with a meal. TAKE 2 TABLETS BY MOUTH EVERY DAY (START THE DAY AFTER CHEMOTHERAPY FOR 2 DAYS) TAKE WITH FOOD    [provider]  dicyclomine (BENTYL) 10 MG capsule Take 1 capsule (10 mg total) by mouth 4 (four) times daily -  before meals and at bedtime. 05/08/21   Volanda Napoleon, MD  fluticasone (FLONASE) 50 MCG/ACT nasal spray Place 2 sprays into both nostrils daily. Patient taking differently: Place 2 sprays into both nostrils daily. As needed 02/11/21   Copland, Gay Filler, MD  lisinopril-hydrochlorothiazide (ZESTORETIC) 20-25 MG tablet Take 1 tablet by mouth daily. 02/11/21   Copland, Gay Filler, MD  magic mouthwash (nystatin, diphenhydrAMINE, alum & mag hydroxide) suspension mixture Take 5 mLs by mouth 4 (four) times daily as needed for mouth pain. 06/21/21   Volanda Napoleon, MD  nortriptyline [facility])  50 MG capsule TAKE 2 CAPSULES BY MOUTH EVERY NIGHT AS DIRECTED 06/27/21   Cameron Sprang, MD  omeprazole (PRILOSEC) 20 MG capsule Take 1 capsule (20 mg total) by mouth daily. 02/11/21   Copland, Gay Filler, MD  ondansetron (ZOFRAN) 8 MG tablet Take by mouth every 8 (eight) hours as needed for nausea or vomiting.    [provider]  potassium chloride SA (KLOR-CON M) 20 MEQ tablet TAKE 2 TABLETS BY MOUTH TWICE DAILY FOR 10 DAYS... THEN ONCE DAILY 06/20/21   Volanda Napoleon, MD  sertraline (ZOLOFT) 25 MG tablet Take 12.5 mg by mouth daily.    [provider]   topiramate (TOPAMAX) 100 MG tablet Take 1 tablet (100 mg total) by mouth 2 (two) times daily. 06/27/21   Cameron Sprang, MD  vitamin E 1000 UNIT capsule Take 1,000 Units by mouth daily.    [provider]  prochlorperazine (COMPAZINE) 10 MG tablet Take 1 tablet (10 mg total) by mouth every 6 (six) hours as needed (Nausea or vomiting). 04/29/21 06/04/21  Volanda Napoleon, MD      Allergies    Sulfa antibiotics and Neosporin  [neomycin-bacitracin zn-polymyx]    Review of Systems   Review of Systems  Constitutional:  Negative for chills and fever.  Respiratory:  Negative for shortness of breath.   Cardiovascular:  Positive for leg swelling. Negative for chest pain.  Neurological:  Negative for syncope and light-headedness.  All other systems reviewed and are negative.   Physical Exam Updated Vital Signs BP (!) 133/96 (BP Location: Left Arm)   Pulse 94   Temp 97.9 F (36.6 C) (Oral)   Resp 20   LMP 01/29/2011   SpO2 100%  Physical Exam Vitals and nursing note reviewed.  Constitutional:      General: She is not in acute distress.    Appearance: Normal appearance. She is not ill-appearing.  HENT:     Head: Normocephalic and atraumatic.     Nose: Nose normal.  Eyes:     General: No scleral icterus.    Extraocular Movements: Extraocular movements intact.     Conjunctiva/sclera: Conjunctivae normal.  Cardiovascular:     Rate and Rhythm: Normal rate and regular rhythm.     Pulses: Normal pulses.     Heart sounds: Normal heart sounds.  Pulmonary:     Effort: Pulmonary effort is normal. No respiratory distress.     Breath sounds: Normal breath sounds. No wheezing or rales.  Abdominal:     General: There is no distension.     Tenderness: There is no abdominal tenderness.  Musculoskeletal:        General: Normal range of motion.     Cervical back: Normal range of motion.     Right lower leg: No edema.     Left lower leg: No edema.     Comments: No evidence of lower  extremity swelling however pain present over the posterior left calf.  Skin:    General: Skin is warm and dry.  Neurological:     General: No focal deficit present.     Mental Status: She is alert. Mental status is at baseline.     ED Results / Procedures / Treatments   Labs (all labs ordered are listed, but only abnormal results are displayed) Labs Reviewed  CBC WITH DIFFERENTIAL/PLATELET  BASIC METABOLIC PANEL  MAGNESIUM    EKG None  Radiology No results found.  Procedures Procedures    Medications Ordered  in ED Medications - No data to display  ED Course/ Medical Decision Making/ A&P                           Medical Decision Making Amount and/or Complexity of Data Reviewed Labs: ordered.  Risk Prescription drug management.   56 year old female presents today for evaluation of left lower extremity pain and swelling since yesterday.  Recently evaluated in the emergency room for syncopal episode and found to be hypokalemic as well.  Received potassium infusion yesterday outpatient in addition to emergency room repletion on 6/12.  Denies additional syncopal episode.  States she is otherwise feeling well.  We will evaluate electrolyte levels today.  Currently do not have ultrasound in the department.  We will treat patient with Eliquis dose and have her follow-up for an outpatient DVT study.  Given patient history of cancer she is at increased risk for DVT.  She is without tachypnea, tachycardia, or hypoxia.  She is without chest pain, or shortness of breath.  Doubt PE. CBC without leukocytosis or anemia.  BMP with potassium of 2.9 otherwise without acute findings.  Will provide patient with 40 mEq prior to discharge.  Patient takes 40 mEq twice daily at home.  Encourage patient to increase this to 3 times daily for the next 4 days and then to decrease back to twice daily unless instructed by her PCP.  Discussed follow-up with PCP in 4 days to have this rechecked.  Patient  is in agreement with this plan.  Patient has also had evolving erythema during her stay here of the left lower extremity.  We will provide her with Keflex to treat cellulitis.  Patient is appropriate for discharge.  Discharged in stable condition.  Ultrasound ordered as an outpatient study to be done tomorrow.  Patient provided a phone number to call to get this scheduled.  First dose of Eliquis provided in the emergency room.  Patient states she has enough supply of potassium to increase to 3 times daily for the next 4 days.  Her prescription was just filled today.   Final Clinical Impression(s) / ED Diagnoses Final diagnoses:  Pain and swelling of left lower leg  Cellulitis of left lower extremity    Rx / DC Orders ED Discharge Orders          Ordered    cephALEXin (KEFLEX) 500 MG capsule  3 times daily        06/29/21 2244    US Venous Img Lower Unilateral Left        06/29/21 2244              Evlyn Courier, PA-C 06/29/21 2245    Lennice Sites, DO 06/29/21 2258

## 2021-06-29 NOTE — ED Notes (Signed)
Patient verbalizes understanding of discharge instructions. Opportunity for questioning and answers were provided. Armband removed by staff, pt discharged from ED. Ambulated out to lobby  

## 2021-06-30 ENCOUNTER — Ambulatory Visit (HOSPITAL_BASED_OUTPATIENT_CLINIC_OR_DEPARTMENT_OTHER)
Admission: RE | Admit: 2021-06-30 | Discharge: 2021-06-30 | Disposition: A | Discharge status: Home / Self Care | Payer: BC Managed Care – PPO | Source: Ambulatory Visit | Attending: Emergency Medicine | Admitting: Emergency Medicine

## 2021-06-30 ENCOUNTER — Telehealth (HOSPITAL_BASED_OUTPATIENT_CLINIC_OR_DEPARTMENT_OTHER): Payer: Self-pay | Admitting: Emergency Medicine

## 2021-06-30 DIAGNOSIS — I82432 Acute embolism and thrombosis of left popliteal vein: Secondary | ICD-10-CM | POA: Diagnosis not present

## 2021-06-30 DIAGNOSIS — I82532 Chronic embolism and thrombosis of left popliteal vein: Secondary | ICD-10-CM | POA: Diagnosis not present

## 2021-06-30 DIAGNOSIS — M7989 Other specified soft tissue disorders: Secondary | ICD-10-CM | POA: Insufficient documentation

## 2021-06-30 DIAGNOSIS — M79662 Pain in left lower leg: Secondary | ICD-10-CM | POA: Diagnosis not present

## 2021-06-30 DIAGNOSIS — I82552 Chronic embolism and thrombosis of left peroneal vein: Secondary | ICD-10-CM | POA: Diagnosis not present

## 2021-06-30 MED ORDER — RIVAROXABAN (XARELTO) VTE STARTER PACK (15 & 20 MG): ORAL_TABLET | ORAL | 0 refills | Status: DC

## 2021-06-30 NOTE — Telephone Encounter (Signed)
Positive DVT study. Patient denies chest pain, SOB and cough, no history of bleeding., Will rx xarelto starter pack and have her follow up with PCP

## 2021-07-02 ENCOUNTER — Inpatient Hospital Stay: Payer: BC Managed Care – PPO

## 2021-07-02 ENCOUNTER — Other Ambulatory Visit: Payer: Self-pay

## 2021-07-02 ENCOUNTER — Other Ambulatory Visit: Payer: Self-pay | Admitting: Oncology

## 2021-07-02 ENCOUNTER — Encounter: Payer: Self-pay | Admitting: Hematology & Oncology

## 2021-07-02 ENCOUNTER — Inpatient Hospital Stay (HOSPITAL_BASED_OUTPATIENT_CLINIC_OR_DEPARTMENT_OTHER): Payer: BC Managed Care – PPO | Admitting: Hematology & Oncology

## 2021-07-02 ENCOUNTER — Encounter: Payer: Self-pay | Admitting: *Deleted

## 2021-07-02 VITALS — BP 133/72 | HR 111 | Temp 97.7°F | Resp 18 | Wt 292.0 lb

## 2021-07-02 VITALS — HR 89

## 2021-07-02 DIAGNOSIS — I10 Essential (primary) hypertension: Secondary | ICD-10-CM

## 2021-07-02 DIAGNOSIS — Z5111 Encounter for antineoplastic chemotherapy: Secondary | ICD-10-CM | POA: Diagnosis not present

## 2021-07-02 DIAGNOSIS — E876 Hypokalemia: Secondary | ICD-10-CM

## 2021-07-02 DIAGNOSIS — C20 Malignant neoplasm of rectum: Secondary | ICD-10-CM

## 2021-07-02 DIAGNOSIS — Z87891 Personal history of nicotine dependence: Secondary | ICD-10-CM | POA: Diagnosis not present

## 2021-07-02 DIAGNOSIS — J309 Allergic rhinitis, unspecified: Secondary | ICD-10-CM

## 2021-07-02 DIAGNOSIS — I82432 Acute embolism and thrombosis of left popliteal vein: Secondary | ICD-10-CM | POA: Diagnosis not present

## 2021-07-02 DIAGNOSIS — R7303 Prediabetes: Secondary | ICD-10-CM

## 2021-07-02 DIAGNOSIS — Z7189 Other specified counseling: Secondary | ICD-10-CM

## 2021-07-02 DIAGNOSIS — Z452 Encounter for adjustment and management of vascular access device: Secondary | ICD-10-CM | POA: Diagnosis not present

## 2021-07-02 DIAGNOSIS — G43009 Migraine without aura, not intractable, without status migrainosus: Secondary | ICD-10-CM

## 2021-07-02 DIAGNOSIS — Z7901 Long term (current) use of anticoagulants: Secondary | ICD-10-CM | POA: Diagnosis not present

## 2021-07-02 DIAGNOSIS — G932 Benign intracranial hypertension: Secondary | ICD-10-CM

## 2021-07-02 DIAGNOSIS — E782 Mixed hyperlipidemia: Secondary | ICD-10-CM

## 2021-07-02 DIAGNOSIS — M50223 Other cervical disc displacement at C6-C7 level: Secondary | ICD-10-CM

## 2021-07-02 LAB — CMP (CANCER CENTER ONLY)
ALT: 23 U/L (ref 0–44)
ALT: 23 U/L (ref 0–44)
AST: 22 U/L (ref 15–41)
AST: 18 U/L (ref 15–41)
Albumin: 3.4 g/dL — ABNORMAL LOW (ref 3.5–5.0)
Albumin: 3.8 g/dL (ref 3.5–5.0)
Alkaline Phosphatase: 77 U/L (ref 38–126)
Alkaline Phosphatase: 88 U/L (ref 38–126)
Anion gap: 9 (ref 5–15)
Anion gap: 9 (ref 5–15)
BUN: 12 mg/dL (ref 6–20)
BUN: 14 mg/dL (ref 6–20)
CO2: 20 mmol/L — ABNORMAL LOW (ref 22–32)
CO2: 23 mmol/L (ref 22–32)
Calcium: 8 mg/dL — ABNORMAL LOW (ref 8.9–10.3)
Calcium: 8.8 mg/dL — ABNORMAL LOW (ref 8.9–10.3)
Chloride: 106 mmol/L (ref 98–111)
Chloride: 106 mmol/L (ref 98–111)
Creatinine: 0.81 mg/dL (ref 0.44–1.00)
Creatinine: 0.83 mg/dL (ref 0.44–1.00)
GFR, Estimated: >60 mL/min (ref >60)
GFR, Estimated: >60 mL/min (ref >60)
Glucose, Bld: 119 mg/dL — ABNORMAL HIGH (ref 70–99)
Glucose, Bld: 151 mg/dL — ABNORMAL HIGH (ref 70–99)
Potassium: 2.6 mmol/L — CL (ref 3.5–5.1)
Potassium: 3.3 mmol/L — ABNORMAL LOW (ref 3.5–5.1)
Sodium: 135 mmol/L (ref 135–145)
Sodium: 138 mmol/L (ref 135–145)
Total Bilirubin: 0.4 mg/dL (ref 0.3–1.2)
Total Bilirubin: 0.3 mg/dL (ref 0.3–1.2)
Total Protein: 7 g/dL (ref 6.5–8.1)
Total Protein: 6.8 g/dL (ref 6.5–8.1)

## 2021-07-02 LAB — CBC WITH DIFFERENTIAL (CANCER CENTER ONLY)
Abs Immature Granulocytes: 0.01 10*3/uL (ref 0.00–0.07)
Basophils Absolute: 0 10*3/uL (ref 0.0–0.1)
Basophils Relative: 1 %
Eosinophils Absolute: 0.5 10*3/uL (ref 0.0–0.5)
Eosinophils Relative: 7 %
HCT: 34.7 % — ABNORMAL LOW (ref 36.0–46.0)
Hemoglobin: 11.4 g/dL — ABNORMAL LOW (ref 12.0–15.0)
Immature Granulocytes: 0 %
Lymphocytes Relative: 18 %
Lymphs Abs: 1.2 10*3/uL (ref 0.7–4.0)
MCH: 29.5 pg (ref 26.0–34.0)
MCHC: 32.9 g/dL (ref 30.0–36.0)
MCV: 89.7 fL (ref 80.0–100.0)
Monocytes Absolute: 0.4 10*3/uL (ref 0.1–1.0)
Monocytes Relative: 7 %
Neutro Abs: 4.4 10*3/uL (ref 1.7–7.7)
Neutrophils Relative %: 67 %
Platelet Count: 134 10*3/uL — ABNORMAL LOW (ref 150–400)
RBC: 3.87 MIL/uL (ref 3.87–5.11)
RDW: 16.6 % — ABNORMAL HIGH (ref 11.5–15.5)
WBC Count: 6.6 10*3/uL (ref 4.0–10.5)
nRBC: 0 % (ref 0.0–0.2)

## 2021-07-02 MED ORDER — OXALIPLATIN CHEMO INJECTION 100 MG/20ML
85.0000 mg/m2 | Freq: Once | INTRAVENOUS | Status: AC
  Administered 2021-07-02: 215 mg via INTRAVENOUS
  Filled 2021-07-02: qty 40

## 2021-07-02 MED ORDER — SODIUM CHLORIDE 0.9 % IV SOLN
10.0000 mg | Freq: Once | INTRAVENOUS | Status: AC
  Administered 2021-07-02: 10 mg via INTRAVENOUS
  Filled 2021-07-02: qty 10

## 2021-07-02 MED ORDER — PALONOSETRON HCL INJECTION 0.25 MG/5ML
0.2500 mg | Freq: Once | INTRAVENOUS | Status: AC
  Administered 2021-07-02: 0.25 mg via INTRAVENOUS
  Filled 2021-07-02: qty 5

## 2021-07-02 MED ORDER — DEXTROSE 5 % IV SOLN: Freq: Once | INTRAVENOUS | Status: AC

## 2021-07-02 MED ORDER — SODIUM CHLORIDE 0.9 % IV SOLN
1990.0000 mg/m2 | INTRAVENOUS | Status: DC
  Administered 2021-07-02: 5000 mg via INTRAVENOUS
  Filled 2021-07-02: qty 100

## 2021-07-02 MED ORDER — FLUOROURACIL CHEMO INJECTION 2.5 GM/50ML
300.0000 mg/m2 | Freq: Once | INTRAVENOUS | Status: AC
  Administered 2021-07-02: 750 mg via INTRAVENOUS
  Filled 2021-07-02: qty 15

## 2021-07-02 MED ORDER — LEUCOVORIN CALCIUM INJECTION 350 MG
400.0000 mg/m2 | Freq: Once | INTRAVENOUS | Status: AC
  Administered 2021-07-02: 1000 mg via INTRAVENOUS
  Filled 2021-07-02: qty 50

## 2021-07-02 NOTE — Progress Notes (Signed)
Hematology and Oncology Follow Up Visit  Janice Lucas [id number] 1965/03/21 56 y.o. 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 daily-started on 06/30/2021     Interim History:  Janice Lucas is back for follow-up.  Unfortunately, now we have another issue.  She developed a thrombus in the left lower leg.  She went to the emergency room over the weekend.  She had a Doppler of the left leg done.  This did show a thrombus in the popliteal vein down to the peroneal vein.  Thankfully, there is no thrombus in the thigh.  She is started on Xarelto.  The left leg feels a little bit better.  I am sure that this thrombus is secondary to her underlying adenocarcinoma.  She did see Dr. Morton Stall at [facility].  He felt everything was going okay for her.  From what Ms. Wesenberg says, that surgery has been scheduled for early December.  Hopefully, we can get this done sooner depending on how well things go with the therapy.  She still waste potassium.  Much as to why she does lose potassium so easily.  We will have to see what her labs look like today.  She has had no problem with mouth sores.  There is no cough or shortness of breath.  She has had no nausea or vomiting.  She has had no diarrhea.  There is been no bleeding.  Overall, I would say performance status is probably ECOG 0.     Medications:  Current Outpatient Medications:    acetaminophen (TYLENOL) 500 MG tablet, Take 500 mg by mouth every 6 (six) hours as needed., Disp: , Rfl:    acetaZOLAMIDE ER (DIAMOX) 500 MG capsule, Take 1 capsule twice a day, Disp: 180 capsule, Rfl: 3   albuterol (VENTOLIN HFA) 108 (90 Base) MCG/ACT inhaler, Inhale 2 puffs into the lungs every 6 (six) hours as needed  for wheezing or shortness of breath., Disp: 1 each, Rfl: 0   Ascorbic Acid (VITAMIN C) 1000 MG tablet, Take 1,000 mg by mouth daily., Disp: , Rfl:    aspirin EC 81 MG tablet, Take 81 mg by mouth daily., Disp: , Rfl:    atorvastatin (LIPITOR) 20 MG tablet, Take 1 tablet  by mouth daily., Disp: 90 tablet, Rfl: 3   Biotin 10000 MCG TABS, Take by mouth daily., Disp: , Rfl:    Calcium Carb-Cholecalciferol (CALCIUM 1000 + D PO), Take by mouth 2 (two) times daily., Disp: , Rfl:    capecitabine (XELODA) 500 MG tablet, Take 4 tablets (2,000 mg total) by mouth 2 (two) times daily after a meal. Take twice a day for 14 days on and 7 days off. (Patient not taking: Reported on 06/04/2021), Disp: 112 tablet, Rfl: 3   cephALEXin (KEFLEX) 500 MG capsule, Take 1 capsule (500 mg total) by mouth 3 (three) times daily for 7 days., Disp: 21 capsule, Rfl: 0   cholecalciferol (VITAMIN D3) 25 MCG (1000 UT) tablet, Take 1,000 Units by mouth daily., Disp: , Rfl:    dexamethasone (DECADRON) 4 MG tablet, Take 4 mg by mouth 2 (two) times daily with a meal. TAKE 2 TABLETS BY MOUTH EVERY DAY (START THE DAY AFTER CHEMOTHERAPY FOR 2 DAYS) TAKE WITH FOOD, Disp: , Rfl:    dicyclomine (  BENTYL) 10 MG capsule, Take 1 capsule (10 mg total) by mouth 4 (four) times daily -  before meals and at bedtime., Disp: 90 capsule, Rfl: 1   fluticasone (FLONASE) 50 MCG/ACT nasal spray, Place 2 sprays into both nostrils daily. (Patient taking differently: Place 2 sprays into both nostrils daily. As needed), Disp: 16 g, Rfl: 6   lisinopril-hydrochlorothiazide (ZESTORETIC) 20-25 MG tablet, Take 1 tablet by mouth daily., Disp: 90 tablet, Rfl: 3   magic mouthwash (nystatin, diphenhydrAMINE, alum & mag hydroxide) suspension mixture, Take 5 mLs by mouth 4 (four) times daily as needed for mouth pain., Disp: 240 mL, Rfl: 1   nortriptyline (PAMELOR) 50 MG capsule, TAKE 2 CAPSULES BY MOUTH EVERY NIGHT AS DIRECTED, Disp: 180 capsule, Rfl: 3   omeprazole (PRILOSEC)  20 MG capsule, Take 1 capsule (20 mg total) by mouth daily., Disp: 90 capsule, Rfl: 3   ondansetron (ZOFRAN) 8 MG tablet, Take by mouth every 8 (eight) hours as needed for nausea or vomiting., Disp: , Rfl:    potassium chloride SA (KLOR-CON M) 20 MEQ tablet, TAKE 2 TABLETS BY MOUTH TWICE DAILY FOR 10 DAYS... THEN ONCE DAILY, Disp: 60 tablet, Rfl: 0   RIVAROXABAN (XARELTO) VTE STARTER PACK (15 & 20 MG), Follow package directions: Take one '15mg'$  tablet by mouth twice a day. On day 22, switch to one '20mg'$  tablet once a day. Take with food., Disp: 51 each, Rfl: 0   sertraline (ZOLOFT) 25 MG tablet, Take 12.5 mg by mouth daily., Disp: , Rfl:    topiramate (TOPAMAX) 100 MG tablet, Take 1 tablet (100 mg total) by mouth 2 (two) times daily., Disp: 180 tablet, Rfl: 3   vitamin E 1000 UNIT capsule, Take 1,000 Units by mouth daily., Disp: , Rfl:   Allergies:  Allergies  Allergen Reactions   Sulfa Antibiotics Other (See Comments)    Acts "goofy"   Neosporin  [Neomycin-Bacitracin Zn-Polymyx] Rash    Past Medical History, Surgical history, Social history, and Family History were reviewed and updated.  Review of Systems: Review of Systems  Constitutional: Negative.   HENT:  Negative.    Eyes: Negative.   Respiratory: Negative.    Cardiovascular: Negative.   Gastrointestinal: Negative.   Endocrine: Negative.   Genitourinary: Negative.    Musculoskeletal: Negative.   Skin: Negative.   Neurological: Negative.   Hematological: Negative.   Psychiatric/Behavioral: Negative.      Physical Exam:  weight is 292 lb (132.5 kg). Her oral temperature is 97.7 F (36.5 C). Her blood pressure is 133/72 and her pulse is 111 (abnormal). Her respiration is 18 and oxygen saturation is 100%.   Wt Readings from Last 3 Encounters:  07/02/21 292 lb (132.5 kg)  06/27/21 289 lb 9.6 oz (131.4 kg)  06/24/21 290 lb (131.5 kg)    Physical Exam Vitals reviewed.  HENT:     Head: Normocephalic and atraumatic.  Eyes:      Pupils: Pupils are equal, round, and reactive to light.  Cardiovascular:     Rate and Rhythm: Normal rate and regular rhythm.     Heart sounds: Normal heart sounds.  Pulmonary:     Effort: Pulmonary effort is normal.     Breath sounds: Normal breath sounds.  Abdominal:     General: Bowel sounds are normal.     Palpations: Abdomen is soft.  Musculoskeletal:        General: No tenderness or deformity. Normal range of motion.     Cervical back: Normal range  of motion.     Comments: Lower extremity shows a little bit of swelling and pain in the left lower extremity.  She has a positive Homans' sign in the left leg.  Right leg is unremarkable.  She does have good pulses in her distal extremities.  Lymphadenopathy:     Cervical: No cervical adenopathy.  Skin:    General: Skin is warm and dry.     Findings: No erythema or rash.  Neurological:     Mental Status: She is alert and oriented to person, place, and time.  Psychiatric:        Behavior: Behavior normal.        Thought Content: Thought content normal.        Judgment: Judgment normal.      Lab Results  Component Value Date   WBC 6.6 07/02/2021   HGB 11.4 (L) 07/02/2021   HCT 34.7 (L) 07/02/2021   MCV 89.7 07/02/2021   PLT 134 (L) 07/02/2021     Chemistry      Component Value Date/Time   NA 136 06/29/2021 2208   K 2.9 (L) 06/29/2021 2208   CL 107 06/29/2021 2208   CO2 20 (L) 06/29/2021 2208   BUN 9 06/29/2021 2208   CREATININE 0.82 06/29/2021 2208   CREATININE 0.81 06/27/2021 1438   CREATININE 0.84 11/15/2019 1516      Component Value Date/Time   CALCIUM 8.2 (L) 06/29/2021 2208   ALKPHOS 77 06/27/2021 1438   AST 22 06/27/2021 1438   ALT 23 06/27/2021 1438   BILITOT 0.4 06/27/2021 1438      Impression and Plan: Ms. Handley is a very charming 56 year old white female.  She has a localized rectal cancer.  By staging studies looks like this is a stage IIA.  I just feel bad that she has had always issues  with respect to treatment.  She really had a very difficult time with Xeloda.  We had to stop the Xeloda and put her on FOLFOX.  So far, she is done okay with the FOLFOX.  We will have to see how she does with her potassium.  Now she has the thrombus in the left leg.  This clearly will cause problems with respect to her having surgery.  I went over about have to get a filter in her temporarily for when she does have surgery.  I will have to think about that when the time comes.  She will clearly need to be on full dose anticoagulation up until she has surgery.  I will plan for her next cycle of chemotherapy with FOLFOX.  After 3 cycles, I will then plan for some scans.  We will then see how everything looks.  Hopefully, we will then be able to proceed with radiation therapy.  I will plan to see her back in another couple weeks.   Volanda Napoleon, MD 6/20/20238:58 AM

## 2021-07-02 NOTE — Progress Notes (Unsigned)
Patient was seen in the ED over the weekend and diagnosed with a new DVT. She also continues to be hypokalemic with some improvement today.   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

## 2021-07-02 NOTE — Progress Notes (Signed)
Pt instructed to take 40 meq of oral potassium twice a day per order of Dr. Marin Olp.  Teach back done.

## 2021-07-02 NOTE — Addendum Note (Signed)
Addended by: Volanda Napoleon on: 07/02/2021 09:33 AM   Modules accepted: Orders

## 2021-07-03 ENCOUNTER — Encounter: Payer: Self-pay | Admitting: Hematology & Oncology

## 2021-07-04 ENCOUNTER — Inpatient Hospital Stay: Payer: BC Managed Care – PPO

## 2021-07-04 VITALS — BP 120/83 | HR 75 | Temp 97.6°F | Resp 18

## 2021-07-04 DIAGNOSIS — Z87891 Personal history of nicotine dependence: Secondary | ICD-10-CM | POA: Diagnosis not present

## 2021-07-04 DIAGNOSIS — I82432 Acute embolism and thrombosis of left popliteal vein: Secondary | ICD-10-CM | POA: Diagnosis not present

## 2021-07-04 DIAGNOSIS — C20 Malignant neoplasm of rectum: Secondary | ICD-10-CM

## 2021-07-04 DIAGNOSIS — Z452 Encounter for adjustment and management of vascular access device: Secondary | ICD-10-CM | POA: Diagnosis not present

## 2021-07-04 DIAGNOSIS — Z7901 Long term (current) use of anticoagulants: Secondary | ICD-10-CM | POA: Diagnosis not present

## 2021-07-04 DIAGNOSIS — Z5111 Encounter for antineoplastic chemotherapy: Secondary | ICD-10-CM | POA: Diagnosis not present

## 2021-07-04 MED ORDER — HEPARIN SOD (PORK) LOCK FLUSH 100 UNIT/ML IV SOLN
500.0000 [IU] | Freq: Once | INTRAVENOUS | Status: AC | PRN
  Administered 2021-07-04: 500 [IU]

## 2021-07-04 MED ORDER — SODIUM CHLORIDE 0.9% FLUSH
10.0000 mL | INTRAVENOUS | Status: DC | PRN
  Administered 2021-07-04: 10 mL

## 2021-07-04 NOTE — Patient Instructions (Signed)

## 2021-07-06 ENCOUNTER — Other Ambulatory Visit (HOSPITAL_COMMUNITY): Payer: Self-pay

## 2021-07-09 ENCOUNTER — Other Ambulatory Visit: Payer: Self-pay | Admitting: *Deleted

## 2021-07-09 ENCOUNTER — Other Ambulatory Visit (HOSPITAL_COMMUNITY): Payer: Self-pay

## 2021-07-09 ENCOUNTER — Encounter: Payer: Self-pay | Admitting: Hematology & Oncology

## 2021-07-09 MED ORDER — POTASSIUM CHLORIDE CRYS ER 20 MEQ PO TBCR
40.0000 meq | EXTENDED_RELEASE_TABLET | Freq: Two times a day (BID) | ORAL | 1 refills | Status: DC
  Filled 2021-07-09: qty 120, 30d supply, fill #0

## 2021-07-10 ENCOUNTER — Other Ambulatory Visit (HOSPITAL_COMMUNITY): Payer: Self-pay

## 2021-07-11 ENCOUNTER — Encounter: Payer: Self-pay | Admitting: Hematology & Oncology

## 2021-07-11 ENCOUNTER — Other Ambulatory Visit (HOSPITAL_COMMUNITY): Payer: Self-pay

## 2021-07-11 ENCOUNTER — Other Ambulatory Visit: Payer: Self-pay | Admitting: *Deleted

## 2021-07-11 ENCOUNTER — Telehealth: Payer: Self-pay | Admitting: *Deleted

## 2021-07-11 ENCOUNTER — Ambulatory Visit (HOSPITAL_COMMUNITY)
Admission: RE | Admit: 2021-07-11 | Discharge: 2021-07-11 | Disposition: A | Discharge status: Home / Self Care | Payer: BC Managed Care – PPO | Source: Ambulatory Visit | Attending: Neurology | Admitting: Neurology

## 2021-07-11 DIAGNOSIS — R55 Syncope and collapse: Secondary | ICD-10-CM | POA: Insufficient documentation

## 2021-07-11 DIAGNOSIS — G932 Benign intracranial hypertension: Secondary | ICD-10-CM | POA: Diagnosis not present

## 2021-07-11 MED ORDER — HYDROCODONE BIT-HOMATROP MBR 5-1.5 MG/5ML PO SOLN
5.0000 mL | Freq: Four times a day (QID) | ORAL | 0 refills | Status: DC | PRN
  Filled 2021-07-11: qty 120, 6d supply, fill #0

## 2021-07-11 NOTE — Telephone Encounter (Signed)
Call received from pt stating that she has a sore throat which is relieved by Magic Mouthwash, but now has a deep, dry cough which she states she has had in the past and is relieved with Hycodan cough syrup.  Dr. Marin Olp notified and prescription for Hycodan sent to pt.'s pharmacy.  Pt notified and is appreciative of assistance.

## 2021-07-11 NOTE — Progress Notes (Signed)
Outpatient adult EEG complete - results pending.

## 2021-07-13 ENCOUNTER — Other Ambulatory Visit (HOSPITAL_COMMUNITY): Payer: Self-pay

## 2021-07-17 ENCOUNTER — Inpatient Hospital Stay: Payer: BC Managed Care – PPO

## 2021-07-17 ENCOUNTER — Inpatient Hospital Stay: Payer: BC Managed Care – PPO | Admitting: Hematology & Oncology

## 2021-07-17 ENCOUNTER — Encounter: Payer: Self-pay | Admitting: Hematology & Oncology

## 2021-07-17 ENCOUNTER — Inpatient Hospital Stay: Payer: BC Managed Care – PPO | Attending: Hematology & Oncology

## 2021-07-17 ENCOUNTER — Telehealth: Payer: Self-pay | Admitting: Neurology

## 2021-07-17 VITALS — BP 103/61 | HR 98 | Temp 98.2°F | Resp 19 | Wt 292.0 lb

## 2021-07-17 DIAGNOSIS — E876 Hypokalemia: Secondary | ICD-10-CM | POA: Insufficient documentation

## 2021-07-17 DIAGNOSIS — C20 Malignant neoplasm of rectum: Secondary | ICD-10-CM

## 2021-07-17 DIAGNOSIS — Z87891 Personal history of nicotine dependence: Secondary | ICD-10-CM | POA: Insufficient documentation

## 2021-07-17 DIAGNOSIS — I82462 Acute embolism and thrombosis of left calf muscular vein: Secondary | ICD-10-CM | POA: Diagnosis not present

## 2021-07-17 DIAGNOSIS — Z5111 Encounter for antineoplastic chemotherapy: Secondary | ICD-10-CM | POA: Insufficient documentation

## 2021-07-17 DIAGNOSIS — Z7901 Long term (current) use of anticoagulants: Secondary | ICD-10-CM | POA: Insufficient documentation

## 2021-07-17 DIAGNOSIS — Z452 Encounter for adjustment and management of vascular access device: Secondary | ICD-10-CM | POA: Diagnosis not present

## 2021-07-17 LAB — CBC WITH DIFFERENTIAL (CANCER CENTER ONLY)
Abs Immature Granulocytes: 0.02 10*3/uL (ref 0.00–0.07)
Basophils Absolute: 0 10*3/uL (ref 0.0–0.1)
Basophils Relative: 1 %
Eosinophils Absolute: 0.3 10*3/uL (ref 0.0–0.5)
Eosinophils Relative: 8 %
HCT: 33.2 % — ABNORMAL LOW (ref 36.0–46.0)
Hemoglobin: 10.9 g/dL — ABNORMAL LOW (ref 12.0–15.0)
Immature Granulocytes: 1 %
Lymphocytes Relative: 28 %
Lymphs Abs: 1.2 10*3/uL (ref 0.7–4.0)
MCH: 29.9 pg (ref 26.0–34.0)
MCHC: 32.8 g/dL (ref 30.0–36.0)
MCV: 91 fL (ref 80.0–100.0)
Monocytes Absolute: 0.4 10*3/uL (ref 0.1–1.0)
Monocytes Relative: 10 %
Neutro Abs: 2.2 10*3/uL (ref 1.7–7.7)
Neutrophils Relative %: 52 %
Platelet Count: 190 10*3/uL (ref 150–400)
RBC: 3.65 MIL/uL — ABNORMAL LOW (ref 3.87–5.11)
RDW: 17.5 % — ABNORMAL HIGH (ref 11.5–15.5)
WBC Count: 4.1 10*3/uL (ref 4.0–10.5)
nRBC: 0 % (ref 0.0–0.2)

## 2021-07-17 LAB — CMP (CANCER CENTER ONLY)
ALT: 27 U/L (ref 0–44)
AST: 25 U/L (ref 15–41)
Albumin: 3.8 g/dL (ref 3.5–5.0)
Alkaline Phosphatase: 94 U/L (ref 38–126)
Anion gap: 10 (ref 5–15)
BUN: 10 mg/dL (ref 6–20)
CO2: 23 mmol/L (ref 22–32)
Calcium: 8.7 mg/dL — ABNORMAL LOW (ref 8.9–10.3)
Chloride: 104 mmol/L (ref 98–111)
Creatinine: 0.89 mg/dL (ref 0.44–1.00)
GFR, Estimated: >60 mL/min (ref >60)
Glucose, Bld: 170 mg/dL — ABNORMAL HIGH (ref 70–99)
Potassium: 3 mmol/L — ABNORMAL LOW (ref 3.5–5.1)
Sodium: 137 mmol/L (ref 135–145)
Total Bilirubin: 0.3 mg/dL (ref 0.3–1.2)
Total Protein: 6.7 g/dL (ref 6.5–8.1)

## 2021-07-17 LAB — CEA (IN HOUSE-CHCC): CEA (CHCC-In House): 5.98 ng/mL — ABNORMAL HIGH (ref 0.00–5.00)

## 2021-07-17 MED ORDER — OXALIPLATIN CHEMO INJECTION 100 MG/20ML
85.0000 mg/m2 | Freq: Once | INTRAVENOUS | Status: AC
  Administered 2021-07-17: 215 mg via INTRAVENOUS
  Filled 2021-07-17: qty 40

## 2021-07-17 MED ORDER — FLUOROURACIL CHEMO INJECTION 2.5 GM/50ML
300.0000 mg/m2 | Freq: Once | INTRAVENOUS | Status: AC
  Administered 2021-07-17: 750 mg via INTRAVENOUS
  Filled 2021-07-17: qty 15

## 2021-07-17 MED ORDER — SODIUM CHLORIDE 0.9% FLUSH: 10.0000 mL | INTRAVENOUS | Status: DC | PRN

## 2021-07-17 MED ORDER — HEPARIN SOD (PORK) LOCK FLUSH 100 UNIT/ML IV SOLN: 500.0000 [IU] | Freq: Once | INTRAVENOUS | Status: DC | PRN

## 2021-07-17 MED ORDER — DEXTROSE 5 % IV SOLN: Freq: Once | INTRAVENOUS | Status: AC

## 2021-07-17 MED ORDER — PALONOSETRON HCL INJECTION 0.25 MG/5ML
0.2500 mg | Freq: Once | INTRAVENOUS | Status: AC
  Administered 2021-07-17: 0.25 mg via INTRAVENOUS
  Filled 2021-07-17: qty 5

## 2021-07-17 MED ORDER — SODIUM CHLORIDE 0.9 % IV SOLN
10.0000 mg | Freq: Once | INTRAVENOUS | Status: AC
  Administered 2021-07-17: 10 mg via INTRAVENOUS
  Filled 2021-07-17: qty 10

## 2021-07-17 MED ORDER — LEUCOVORIN CALCIUM INJECTION 350 MG
400.0000 mg/m2 | Freq: Once | INTRAVENOUS | Status: AC
  Administered 2021-07-17: 1000 mg via INTRAVENOUS
  Filled 2021-07-17: qty 50

## 2021-07-17 MED ORDER — SODIUM CHLORIDE 0.9 % IV SOLN
1920.0000 mg/m2 | INTRAVENOUS | Status: DC
  Administered 2021-07-17: 4800 mg via INTRAVENOUS
  Filled 2021-07-17: qty 96

## 2021-07-17 NOTE — Telephone Encounter (Signed)
Called patient and she wanted to know if she can get her MRI brain done at the same time that her cancer doctor will order their MRI for her rectal cancer. I informed patient that if the imaging location has the open slots she may get both scans done. Also informed patient that there still will be a charge for both MRI procedures as they are different. Patient verbalized understanding and had no further questions or concerns.

## 2021-07-17 NOTE — Telephone Encounter (Signed)
Patient called and asked for a return phone call.

## 2021-07-17 NOTE — Patient Instructions (Signed)
[facility]  Discharge Instructions: Thank you for choosing [facility] to provide your oncology and hematology care.   If you have a lab appointment with the [facility], please go directly to the [facility] and check in at the registration area.  Wear comfortable clothing and clothing appropriate for easy access to any Portacath or PICC line.   We strive to give you quality time with your provider. You may need to reschedule your appointment if you arrive late (15 or more minutes).  Arriving late affects you and other patients whose appointments are after yours.  Also, if you miss three or more appointments without notifying the office, you may be dismissed from the clinic at the provider's discretion.      For prescription refill requests, have your pharmacy contact our office and allow 72 hours for refills to be completed.    Today you received the following chemotherapy and/or immunotherapy agents FOLFOX      To help prevent nausea and vomiting after your treatment, we encourage you to take your nausea medication as directed.  BELOW ARE SYMPTOMS THAT SHOULD BE REPORTED IMMEDIATELY: *FEVER GREATER THAN 100.4 F (38 C) OR HIGHER *CHILLS OR SWEATING *NAUSEA AND VOMITING THAT IS NOT CONTROLLED WITH YOUR NAUSEA MEDICATION *UNUSUAL SHORTNESS OF BREATH *UNUSUAL BRUISING OR BLEEDING *URINARY PROBLEMS (pain or burning when urinating, or frequent urination) *BOWEL PROBLEMS (unusual diarrhea, constipation, pain near the anus) TENDERNESS IN MOUTH AND THROAT WITH OR WITHOUT PRESENCE OF ULCERS (sore throat, sores in mouth, or a toothache) UNUSUAL RASH, SWELLING OR PAIN  UNUSUAL VAGINAL DISCHARGE OR ITCHING   Items with * indicate a potential emergency and should be followed up as soon as possible or go to the Emergency Department if any problems should occur.  Please show the CHEMOTHERAPY ALERT CARD or IMMUNOTHERAPY ALERT CARD at check-in to the  Emergency Department and triage nurse. Should you have questions after your visit or need to cancel or reschedule your appointment, please contact [facility]  [phone] and follow the prompts.  Office hours are 8:00 a.m. to 4:30 p.m. Monday - Friday. Please note that voicemails left after 4:00 p.m. may not be returned until the following business day.  We are closed weekends and major holidays. You have access to a nurse at all times for urgent questions. Please call the main number to the clinic [phone] and follow the prompts.  For any non-urgent questions, you may also contact your provider using MyChart. We now offer e-Visits for anyone 39 and older to request care online for non-urgent symptoms. For details visit [email or web address].   Also download the MyChart app! Go to the app store, search "MyChart", open the app, select [facility], and log in with your MyChart username and password.  Masks are optional in the cancer centers. If you would like for your care team to wear a mask while they are taking care of you, please let them know. For doctor visits, patients may have with them one support person who is at least 56 years old. At this time, visitors are not allowed in the infusion area.

## 2021-07-17 NOTE — Progress Notes (Signed)
Hematology and Oncology Follow Up Visit  Janice Lucas [id number] [birth year] 56 y.o. 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 daily-started on 06/30/2021     Interim History:  Ms. Sibert is back for follow-up.  He seem to be doing a bit better with the FOLFOX.  She is still having some issues with respect to mouth sores.  She does not have any today which is nice to see.  She also developed a thrombus since we last treated her.  She has a thrombus in the left leg.  She is on Xarelto.  She is still taking the Xarelto starter pack.  There is been no problems with bleeding.  She has had no diarrhea.  She has had no cough or shortness of breath.  Is been no chest wall pain.  Her appetite is doing okay.  She is still trying to work.  She has had no rashes.  She has had no headache.  Overall, I would have to say that her performance status is probably ECOG 1.    Medications:  Current Outpatient Medications:    acetaminophen (TYLENOL) 500 MG tablet, Take 500 mg by mouth every 6 (six) hours as needed., Disp: , Rfl:    acetaZOLAMIDE ER (DIAMOX) 500 MG capsule, Take 1 capsule twice a day (Patient taking differently: Take 500 mg by mouth once. Take 1 capsule twice a day), Disp: 180 capsule, Rfl: 3   albuterol (VENTOLIN HFA) 108 (90 Base) MCG/ACT inhaler, Inhale 2 puffs into the lungs every 6 (six) hours as needed for wheezing or shortness of breath., Disp: 1 each, Rfl: 0   Ascorbic Acid (VITAMIN C) 1000 MG tablet, Take 1,000 mg by mouth daily., Disp: , Rfl:    aspirin EC 81 MG tablet, Take 81 mg by mouth daily., Disp: , Rfl:    atorvastatin (LIPITOR) 20 MG tablet, Take 1 tablet  by mouth daily., Disp: 90 tablet, Rfl: 3   Biotin  10000 MCG TABS, Take by mouth daily., Disp: , Rfl:    Calcium Carb-Cholecalciferol (CALCIUM 1000 + D PO), Take by mouth 2 (two) times daily., Disp: , Rfl:    capecitabine (XELODA) 500 MG tablet, Take 4 tablets (2,000 mg total) by mouth 2 (two) times daily after a meal. Take twice a day for 14 days on and 7 days off. (Patient not taking: Reported on 06/04/2021), Disp: 112 tablet, Rfl: 3   cholecalciferol (VITAMIN D3) 25 MCG (1000 UT) tablet, Take 1,000 Units by mouth daily., Disp: , Rfl:    dexamethasone (DECADRON) 4 MG tablet, Take 4 mg by mouth 2 (two) times daily with a meal. TAKE 2 TABLETS BY MOUTH EVERY DAY (START THE DAY AFTER CHEMOTHERAPY FOR 2 DAYS) TAKE WITH FOOD, Disp: , Rfl:    dicyclomine (BENTYL) 10 MG capsule, Take 1 capsule (10 mg total) by mouth 4 (four) times daily -  before meals and at bedtime., Disp: 90 capsule, Rfl: 1   fluticasone (FLONASE) 50 MCG/ACT nasal spray, Place 2 sprays into both nostrils daily. (Patient taking differently: Place 2 sprays into both nostrils daily. As needed), Disp: 16 g, Rfl: 6   HYDROcodone bit-homatropine (HYCODAN) 5-1.5 MG/5ML syrup, Take 5 mLs by mouth every 6 (six) hours as needed for cough., Disp: 120  mL, Rfl: 0   lisinopril-hydrochlorothiazide (ZESTORETIC) 20-25 MG tablet, Take 1 tablet by mouth daily., Disp: 90 tablet, Rfl: 3   magic mouthwash (nystatin, diphenhydrAMINE, alum & mag hydroxide) suspension mixture, Take 5 mLs by mouth 4 (four) times daily as needed for mouth pain., Disp: 240 mL, Rfl: 1   nortriptyline (PAMELOR) 50 MG capsule, TAKE 2 CAPSULES BY MOUTH EVERY NIGHT AS DIRECTED, Disp: 180 capsule, Rfl: 3   omeprazole (PRILOSEC) 20 MG capsule, Take 1 capsule (20 mg total) by mouth daily., Disp: 90 capsule, Rfl: 3   ondansetron (ZOFRAN) 8 MG tablet, Take by mouth every 8 (eight) hours as needed for nausea or vomiting., Disp: , Rfl:    potassium chloride SA (KLOR-CON M) 20 MEQ tablet, Take 2 tablets (40 mEq total) by mouth 2 (two) times  daily., Disp: 120 tablet, Rfl: 1   RIVAROXABAN (XARELTO) VTE STARTER PACK (15 & 20 MG), Follow package directions: Take one '15mg'$  tablet by mouth twice a day. On day 22, switch to one '20mg'$  tablet once a day. Take with food., Disp: 51 each, Rfl: 0   sertraline (ZOLOFT) 25 MG tablet, Take 12.5 mg by mouth daily., Disp: , Rfl:    topiramate (TOPAMAX) 100 MG tablet, Take 1 tablet (100 mg total) by mouth 2 (two) times daily., Disp: 180 tablet, Rfl: 3   vitamin E 1000 UNIT capsule, Take 1,000 Units by mouth daily., Disp: , Rfl:   Allergies:  Allergies  Allergen Reactions   Sulfa Antibiotics Other (See Comments)    Acts "goofy"   Neosporin  [Neomycin-Bacitracin Zn-Polymyx] Rash    Past Medical History, Surgical history, Social history, and Family History were reviewed and updated.  Review of Systems: Review of Systems  Constitutional: Negative.   HENT:  Negative.    Eyes: Negative.   Respiratory: Negative.    Cardiovascular: Negative.   Gastrointestinal: Negative.   Endocrine: Negative.   Genitourinary: Negative.    Musculoskeletal: Negative.   Skin: Negative.   Neurological: Negative.   Hematological: Negative.   Psychiatric/Behavioral: Negative.      Physical Exam:  weight is 292 lb (132.5 kg). Her oral temperature is 98.2 F (36.8 C). Her blood pressure is 103/61 and her pulse is 98. Her respiration is 19 and oxygen saturation is 100%.   Wt Readings from Last 3 Encounters:  07/17/21 292 lb (132.5 kg)  07/02/21 292 lb (132.5 kg)  06/27/21 289 lb 9.6 oz (131.4 kg)    Physical Exam Vitals reviewed.  HENT:     Head: Normocephalic and atraumatic.  Eyes:     Pupils: Pupils are equal, round, and reactive to light.  Cardiovascular:     Rate and Rhythm: Normal rate and regular rhythm.     Heart sounds: Normal heart sounds.  Pulmonary:     Effort: Pulmonary effort is normal.     Breath sounds: Normal breath sounds.  Abdominal:     General: Bowel sounds are normal.      Palpations: Abdomen is soft.  Musculoskeletal:        General: No tenderness or deformity. Normal range of motion.     Cervical back: Normal range of motion.     Comments: Lower extremity shows a little bit of swelling and pain in the left lower extremity.  She has a positive Homans' sign in the left leg.  Right leg is unremarkable.  She does have good pulses in her distal extremities.  Lymphadenopathy:     Cervical: No cervical adenopathy.  Skin:    General: Skin is warm and dry.     Findings: No erythema or rash.  Neurological:     Mental Status: She is alert and oriented to person, place, and time.  Psychiatric:        Behavior: Behavior normal.        Thought Content: Thought content normal.        Judgment: Judgment normal.     Lab Results  Component Value Date   WBC 4.1 07/17/2021   HGB 10.9 (L) 07/17/2021   HCT 33.2 (L) 07/17/2021   MCV 91.0 07/17/2021   PLT 190 07/17/2021     Chemistry      Component Value Date/Time   NA 138 07/02/2021 0830   K 3.3 (L) 07/02/2021 0830   CL 106 07/02/2021 0830   CO2 23 07/02/2021 0830   BUN 14 07/02/2021 0830   CREATININE 0.83 07/02/2021 0830   CREATININE 0.84 11/15/2019 1516      Component Value Date/Time   CALCIUM 8.8 (L) 07/02/2021 0830   ALKPHOS 88 07/02/2021 0830   AST 18 07/02/2021 0830   ALT 23 07/02/2021 0830   BILITOT 0.3 07/02/2021 0830      Impression and Plan: Ms. Ceballos is a very charming 56 year old white female.  She has a localized rectal cancer.  By staging studies looks like this is a stage IIA.  We will go ahead with her third cycle of chemotherapy.  This is in the neoadjuvant setting.  I would like to try to treat her with 4 cycles and then go with radiation and low-dose Xeloda.  I know she has had a problem with the Xeloda at a much higher dose.  She also now has the thrombus in the left leg.  She is on Xarelto.  She will need to be on Xarelto for quite a while.  I think she will need to be on Xarelto  until after she has rectal cancer surgery.  I will plan to get her back in another couple weeks.  I probably would not do any type of a Doppler of her legs for another 6 weeks or so. We will leave this  Volanda Napoleon, MD 7/5/20238:42 AM

## 2021-07-17 NOTE — Patient Instructions (Signed)

## 2021-07-17 NOTE — Progress Notes (Signed)
[place] at [facility] [address] [place], [place] [address] [phone]  Date:  07/24/2021   Name:  Janice Lucas   DOB:  06-Nov-1965   MRN:  [id number]  PCP:  Darreld Mclean, MD    Chief Complaint: Follow-up (Concerns/ questions: bad reflux since starting chemo. /06/29/21)   History of Present Illness:  Janice Lucas is a 56 y.o. very pleasant female patient who presents with the following: Last seen by myself in January  Pt with history of HTN, migraine HA, hyperlipidemia, intracranial hypertension/ pseudotumor cerebri dx in 2015, pre-diabetes.  She had a colonoscopy in March and was diagnosed with localized rectal cancer  Patient seen today for follow-up from recent ER visit-she was seen in the ER on 6/17 with concern of left leg swelling and pain-she did turn out to have a DVT in the left lower extremity, being treated with Xarelto IMPRESSION: 1. Occlusive DVT within the LEFT popliteal, posterior tibial and peroneal veins.   She was seen most recent on 7/5 by oncology, Dr. Carmie End has localized rectal cancer She was seen there yesterday actually and got IV fluids as she was getting dehydrated She is currently receiving chemotherapy, the next step is radiation for 3 weeks, then will take a 6 to 8-week break and have surgery She has 1 more dose of chemotherapy to go  She did have a seizure about 3-4 weeks ago and is not driving right now -she is under care of neurology.  They have not actually done a brain scan yet, they plan to do this with an upcoming abdominal scan She has never had a seizure before  Patient is still actually going to work every day, her mother is helping her and drives Overall [place] feels like she is doing pretty well.  Her leg is feeling much better since she is on anticoagulation Her only more acute concern is increased nausea, burping and heartburn since she has been using chemotherapy.   She would like to increase her omeprazole to 40 mg of possible  Lab Results  Component Value Date   HGBA1C 6.5 02/11/2021    Patient Active Problem List   Diagnosis Date Noted   Rectal cancer ([place]) 04/26/2021   Goals of care, counseling/discussion 04/26/2021   Allergic rhinitis 05/15/2020   Hypertension 07/05/2018   Migraine without aura and without status migrainosus, not intractable 02/08/2016   Depression 10/10/2015   Injury by electrocution 10/10/2015   Mixed hyperlipidemia 10/10/2015   Prediabetes 10/10/2015   Urinary incontinence 10/10/2015   Sleep apnea 08/30/2015   IIH (idiopathic intracranial hypertension) 11/14/2013   Herniated nucleus pulposus, C6-7 right 02/14/2011    Past Medical History:  Diagnosis Date   Allergy    SEASONAL - MILD   GERD (gastroesophageal reflux disease)    ON OMEPRAZOLE   Goals of care, counseling/discussion 04/26/2021   Hyperlipidemia    ON MEDS   Hypertension    ON MEDS   Rectal cancer ([place]) 04/26/2021   Sleep apnea    WEARS CPAP    Past Surgical History:  Procedure Laterality Date   ANTERIOR CERVICAL DECOMP/DISCECTOMY FUSION  02/14/2011   Procedure: ANTERIOR CERVICAL DECOMPRESSION/DISCECTOMY FUSION 1 LEVEL;  Surgeon: Earleen Newport, MD;  Location: [facility];  Service: Neurosurgery;  Laterality: N/A;  Anterior Cervical Six-Seven Decompression and Fusion   IR IMAGING GUIDED PORT INSERTION  05/02/2021    Social History   Tobacco Use  Smoking status: Former    Packs/day: 1.00    Years: 20.00    Total pack years: 20.00    Types: Cigarettes   Smokeless tobacco: Current   Tobacco comments:    VAPES   Vaping Use   Vaping Use: Every day   Substances: Nicotine, Flavoring  Substance Use Topics   Alcohol use: No    Comment: occassionally   Drug use: Not Currently    Family History  Problem Relation Age of Onset   Prostate cancer Father        METS TO LYMPH NODES AND BLADDER   Diabetes Maternal Aunt    Diabetes Maternal  Uncle    Colon cancer Neg Hx    Colon polyps Neg Hx    Esophageal cancer Neg Hx    Rectal cancer Neg Hx    Stomach cancer Neg Hx     Allergies  Allergen Reactions   Sulfa Antibiotics Other (See Comments)    Acts "goofy"   Neosporin  [Neomycin-Bacitracin Zn-Polymyx] Rash    Medication list has been reviewed and updated.  Current Outpatient Medications on File Prior to Visit  Medication Sig Dispense Refill   acetaminophen (TYLENOL) 500 MG tablet Take 500 mg by mouth every 6 (six) hours as needed.     acetaZOLAMIDE ER (DIAMOX) 500 MG capsule Take 1 capsule twice a day (Patient taking differently: Take 500 mg by mouth once. Take 1 capsule twice a day) 180 capsule 3   albuterol (VENTOLIN HFA) 108 (90 Base) MCG/ACT inhaler Inhale 2 puffs into the lungs every 6 (six) hours as needed for wheezing or shortness of breath. 1 each 0   Ascorbic Acid (VITAMIN C) 1000 MG tablet Take 1,000 mg by mouth daily.     atorvastatin (LIPITOR) 20 MG tablet Take 1 tablet  by mouth daily. 90 tablet 3   Biotin 10000 MCG TABS Take by mouth daily.     Calcium Carb-Cholecalciferol (CALCIUM 1000 + D PO) Take by mouth 2 (two) times daily.     capecitabine (XELODA) 500 MG tablet Take 4 tablets (2,000 mg total) by mouth 2 (two) times daily after a meal. Take twice a day for 14 days on and 7 days off. 112 tablet 3   cholecalciferol (VITAMIN D3) 25 MCG (1000 UT) tablet Take 1,000 Units by mouth daily.     dexamethasone (DECADRON) 4 MG tablet Take 4 mg by mouth 2 (two) times daily with a meal. TAKE 2 TABLETS BY MOUTH EVERY DAY (START THE DAY AFTER CHEMOTHERAPY FOR 2 DAYS) TAKE WITH FOOD     dicyclomine (BENTYL) 10 MG capsule Take 1 capsule (10 mg total) by mouth 4 (four) times daily -  before meals and at bedtime. 90 capsule 1   fluticasone (FLONASE) 50 MCG/ACT nasal spray Place 2 sprays into both nostrils daily. (Patient taking differently: Place 2 sprays into both nostrils daily. As needed) 16 g 6   HYDROcodone  bit-homatropine (HYCODAN) 5-1.5 MG/5ML syrup Take 5 mLs by mouth every 6 (six) hours as needed for cough. 120 mL 0   lisinopril-hydrochlorothiazide (ZESTORETIC) 20-25 MG tablet Take 1 tablet by mouth daily. 90 tablet 3   magic mouthwash (nystatin, diphenhydrAMINE, alum & mag hydroxide) suspension mixture Take 5 mLs by mouth 4 (four) times daily as needed for mouth pain. 240 mL 1   nortriptyline (PAMELOR) 50 MG capsule TAKE 2 CAPSULES BY MOUTH EVERY NIGHT AS DIRECTED 180 capsule 3   ondansetron (ZOFRAN) 8 MG tablet Take by mouth every  8 (eight) hours as needed for nausea or vomiting.     potassium chloride SA (KLOR-CON M) 20 MEQ tablet Take 2 tablets (40 mEq total) by mouth 2 (two) times daily. 120 tablet 1   sertraline (ZOLOFT) 25 MG tablet Take 12.5 mg by mouth daily.     topiramate (TOPAMAX) 100 MG tablet Take 1 tablet (100 mg total) by mouth 2 (two) times daily. 180 tablet 3   vitamin E 1000 UNIT capsule Take 1,000 Units by mouth daily.     [DISCONTINUED] prochlorperazine (COMPAZINE) 10 MG tablet Take 1 tablet (10 mg total) by mouth every 6 (six) hours as needed (Nausea or vomiting). 30 tablet 1   No current facility-administered medications on file prior to visit.    Review of Systems:  As per HPI- otherwise negative.   Physical Examination: Vitals:   07/24/21 1518  BP: 118/80  Pulse: 78  Resp: 18  Temp: 97.9 F (36.6 C)  SpO2: 97%   Vitals:   07/24/21 1518  Weight: 294 lb 12.8 oz (133.7 kg)  Height: '5\' 7"'$  (1.702 m)   Body mass index is 46.17 kg/m. Ideal Body Weight: Weight in (lb) to have BMI = 25: 159.3  GEN: no acute distress.  Obese, robust build.  Looks well HEENT: Atraumatic, Normocephalic.  Ears and Nose: No external deformity. CV: RRR, No M/G/R. No JVD. No thrill. No extra heart sounds. PULM: CTA B, no wheezes, crackles, rhonchi. No retractions. No resp. distress. No accessory muscle use. ABD: S, NT, ND. No rebound. No HSM. EXTR: No c/c/e PSYCH: Normally  interactive. Conversant.  No particular left calf swelling or tenderness at this time  Assessment and Plan: Acute deep vein thrombosis (DVT) of calf muscle vein of left lower extremity (HCC) - Plan: rivaroxaban (XARELTO) 20 MG TABS tablet  Gastroesophageal reflux disease - Plan: omeprazole (PRILOSEC) 40 MG capsule  Rectal cancer (HCC)  Janice Lucas is seen today for follow-up.  She was started on Xarelto by the ER for acute DVT, needs a prescription for continued therapy which I provided today.  Counseled patient her DVT was likely provoked by current cancer diagnosis.  Suspect she will not need lifelong therapy, but defer to hematology about duration of treatment  She has had more GERD symptoms while on chemotherapy, increase intervals of 40 mg daily  Offered support and encouragement Signed Lamar Blinks, MD

## 2021-07-18 ENCOUNTER — Encounter: Payer: Self-pay | Admitting: *Deleted

## 2021-07-18 NOTE — Progress Notes (Signed)
Oncology Nurse Navigator Documentation     07/18/2021    8:15 AM  Oncology Nurse Navigator Flowsheets  Navigator Follow Up Date: 07/31/2021  Navigator Follow Up Reason: Follow-up Appointment  Navigator Location [facility]  Navigator Encounter Type Appt/Treatment Plan Review  Patient Visit Type MedOnc  Treatment Phase Active Tx  Barriers/Navigation Needs Coordination of Care;Education  Interventions None Required  Acuity Level 2-Minimal Needs (1-2 Barriers Identified)  Support Groups/Services Friends and Family  Time Spent with Patient 15

## 2021-07-19 ENCOUNTER — Inpatient Hospital Stay: Payer: BC Managed Care – PPO

## 2021-07-19 VITALS — BP 115/75 | HR 82

## 2021-07-19 DIAGNOSIS — Z87891 Personal history of nicotine dependence: Secondary | ICD-10-CM | POA: Diagnosis not present

## 2021-07-19 DIAGNOSIS — Z5111 Encounter for antineoplastic chemotherapy: Secondary | ICD-10-CM | POA: Diagnosis not present

## 2021-07-19 DIAGNOSIS — C20 Malignant neoplasm of rectum: Secondary | ICD-10-CM | POA: Diagnosis not present

## 2021-07-19 DIAGNOSIS — Z452 Encounter for adjustment and management of vascular access device: Secondary | ICD-10-CM | POA: Diagnosis not present

## 2021-07-19 DIAGNOSIS — I82462 Acute embolism and thrombosis of left calf muscular vein: Secondary | ICD-10-CM | POA: Diagnosis not present

## 2021-07-19 DIAGNOSIS — E876 Hypokalemia: Secondary | ICD-10-CM | POA: Diagnosis not present

## 2021-07-19 DIAGNOSIS — Z7901 Long term (current) use of anticoagulants: Secondary | ICD-10-CM | POA: Diagnosis not present

## 2021-07-19 MED ORDER — SODIUM CHLORIDE 0.9% FLUSH
10.0000 mL | INTRAVENOUS | Status: DC | PRN
  Administered 2021-07-19: 10 mL

## 2021-07-19 MED ORDER — HEPARIN SOD (PORK) LOCK FLUSH 100 UNIT/ML IV SOLN
500.0000 [IU] | Freq: Once | INTRAVENOUS | Status: AC | PRN
  Administered 2021-07-19: 500 [IU]

## 2021-07-19 NOTE — Procedures (Signed)
ELECTROENCEPHALOGRAM REPORT  Date of Study: 07/11/2021  Patient's Name: Janice Lucas MRN: [id number] Date of Birth: [birth year]  Referring Provider: Dr. Ellouise Newer  Clinical History: This is a 56 year old woman with an episode of loss of consciousness. EEG for classification.  Medications: Topamax Diamox Atorvastatin Xeloda Zestoretic Nortriptyline Xarelto Sertraline  Technical Summary: A multichannel digital EEG recording measured by the international 10-20 system with electrodes applied with paste and impedances below 5000 ohms performed in our laboratory with EKG monitoring in an awake and drowsy patient.  Hyperventilation and photic stimulation were performed.  The digital EEG was referentially recorded, reformatted, and digitally filtered in a variety of bipolar and referential montages for optimal display.    Description: The patient is awake and drowsy during the recording.  During maximal wakefulness, there is a symmetric, medium voltage 9 Hz posterior dominant rhythm that attenuates with eye opening.  The record is symmetric.  During drowsiness, there is an increase in theta slowing of the background.  Sleep was not captured. Hyperventilation and photic stimulation did not elicit any abnormalities.  There were no epileptiform discharges or electrographic seizures seen.    EKG lead was unremarkable.  Impression: This awake and drowsy EEG is normal.    Clinical Correlation: A normal EEG does not exclude a clinical diagnosis of epilepsy.  If further clinical questions remain, prolonged EEG may be helpful.  Clinical correlation is advised.   Ellouise Newer, M.D.

## 2021-07-19 NOTE — Patient Instructions (Signed)

## 2021-07-21 ENCOUNTER — Other Ambulatory Visit: Payer: BC Managed Care – PPO

## 2021-07-22 ENCOUNTER — Telehealth: Payer: Self-pay

## 2021-07-22 NOTE — Telephone Encounter (Signed)
Pt called with her EEG results, she is going to have her MRI of the brain and MRI of lower extremity done after her last chemo it will be in about 2 weeks having them done at the same time so that she isnt charged twice,

## 2021-07-22 NOTE — Telephone Encounter (Signed)
-----   Message from Cameron Sprang, MD sent at 07/22/2021 12:20 PM EDT ----- Pls let her know the EEG is normal. Proceed with brain MRI as discussed, I don't see it scheduled, pls give her number of MRI to schedule if she has not heard back. thanks

## 2021-07-23 ENCOUNTER — Inpatient Hospital Stay: Payer: BC Managed Care – PPO

## 2021-07-23 ENCOUNTER — Other Ambulatory Visit: Payer: Self-pay

## 2021-07-23 VITALS — BP 112/68 | HR 89 | Temp 98.9°F | Resp 20

## 2021-07-23 DIAGNOSIS — Z87891 Personal history of nicotine dependence: Secondary | ICD-10-CM | POA: Diagnosis not present

## 2021-07-23 DIAGNOSIS — Z7901 Long term (current) use of anticoagulants: Secondary | ICD-10-CM | POA: Diagnosis not present

## 2021-07-23 DIAGNOSIS — C20 Malignant neoplasm of rectum: Secondary | ICD-10-CM | POA: Diagnosis not present

## 2021-07-23 DIAGNOSIS — E876 Hypokalemia: Secondary | ICD-10-CM | POA: Diagnosis not present

## 2021-07-23 DIAGNOSIS — Z5111 Encounter for antineoplastic chemotherapy: Secondary | ICD-10-CM | POA: Diagnosis not present

## 2021-07-23 DIAGNOSIS — Z452 Encounter for adjustment and management of vascular access device: Secondary | ICD-10-CM | POA: Diagnosis not present

## 2021-07-23 DIAGNOSIS — I82462 Acute embolism and thrombosis of left calf muscular vein: Secondary | ICD-10-CM | POA: Diagnosis not present

## 2021-07-23 LAB — CMP (CANCER CENTER ONLY)
ALT: 29 U/L (ref 0–44)
AST: 18 U/L (ref 15–41)
Albumin: 4 g/dL (ref 3.5–5.0)
Alkaline Phosphatase: 87 U/L (ref 38–126)
Anion gap: 9 (ref 5–15)
BUN: 12 mg/dL (ref 6–20)
CO2: 24 mmol/L (ref 22–32)
Calcium: 8.7 mg/dL — ABNORMAL LOW (ref 8.9–10.3)
Chloride: 99 mmol/L (ref 98–111)
Creatinine: 0.76 mg/dL (ref 0.44–1.00)
GFR, Estimated: >60 mL/min (ref >60)
Glucose, Bld: 126 mg/dL — ABNORMAL HIGH (ref 70–99)
Potassium: 3.2 mmol/L — ABNORMAL LOW (ref 3.5–5.1)
Sodium: 132 mmol/L — ABNORMAL LOW (ref 135–145)
Total Bilirubin: 0.4 mg/dL (ref 0.3–1.2)
Total Protein: 6.6 g/dL (ref 6.5–8.1)

## 2021-07-23 LAB — CBC WITH DIFFERENTIAL (CANCER CENTER ONLY)
Abs Immature Granulocytes: 0.03 10*3/uL (ref 0.00–0.07)
Basophils Absolute: 0 10*3/uL (ref 0.0–0.1)
Basophils Relative: 0 %
Eosinophils Absolute: 0.2 10*3/uL (ref 0.0–0.5)
Eosinophils Relative: 3 %
HCT: 31.8 % — ABNORMAL LOW (ref 36.0–46.0)
Hemoglobin: 10.7 g/dL — ABNORMAL LOW (ref 12.0–15.0)
Immature Granulocytes: 0 %
Lymphocytes Relative: 23 %
Lymphs Abs: 1.7 10*3/uL (ref 0.7–4.0)
MCH: 30.1 pg (ref 26.0–34.0)
MCHC: 33.6 g/dL (ref 30.0–36.0)
MCV: 89.6 fL (ref 80.0–100.0)
Monocytes Absolute: 0.3 10*3/uL (ref 0.1–1.0)
Monocytes Relative: 4 %
Neutro Abs: 5.2 10*3/uL (ref 1.7–7.7)
Neutrophils Relative %: 70 %
Platelet Count: 176 10*3/uL (ref 150–400)
RBC: 3.55 MIL/uL — ABNORMAL LOW (ref 3.87–5.11)
RDW: 16.9 % — ABNORMAL HIGH (ref 11.5–15.5)
WBC Count: 7.4 10*3/uL (ref 4.0–10.5)
nRBC: 0 % (ref 0.0–0.2)

## 2021-07-23 MED ORDER — HEPARIN SOD (PORK) LOCK FLUSH 100 UNIT/ML IV SOLN
500.0000 [IU] | Freq: Once | INTRAVENOUS | Status: AC
  Administered 2021-07-23: 500 [IU] via INTRAVENOUS

## 2021-07-23 MED ORDER — SODIUM CHLORIDE 0.9 % IV SOLN
40.0000 mg | Freq: Once | INTRAVENOUS | Status: AC
  Administered 2021-07-23: 40 mg via INTRAVENOUS
  Filled 2021-07-23: qty 4

## 2021-07-23 MED ORDER — SODIUM CHLORIDE 0.9 % IV SOLN: Freq: Once | INTRAVENOUS | Status: AC

## 2021-07-23 MED ORDER — SODIUM CHLORIDE 0.9% FLUSH
10.0000 mL | INTRAVENOUS | Status: DC | PRN
  Administered 2021-07-23: 10 mL via INTRAVENOUS

## 2021-07-24 ENCOUNTER — Ambulatory Visit: Payer: BC Managed Care – PPO | Admitting: Family Medicine

## 2021-07-24 ENCOUNTER — Encounter: Payer: Self-pay | Admitting: Family Medicine

## 2021-07-24 ENCOUNTER — Other Ambulatory Visit (HOSPITAL_COMMUNITY): Payer: Self-pay

## 2021-07-24 ENCOUNTER — Encounter: Payer: Self-pay | Admitting: Hematology & Oncology

## 2021-07-24 VITALS — BP 118/80 | HR 78 | Temp 97.9°F | Resp 18 | Ht 67.0 in | Wt 294.8 lb

## 2021-07-24 DIAGNOSIS — C20 Malignant neoplasm of rectum: Secondary | ICD-10-CM | POA: Diagnosis not present

## 2021-07-24 DIAGNOSIS — I82462 Acute embolism and thrombosis of left calf muscular vein: Secondary | ICD-10-CM

## 2021-07-24 DIAGNOSIS — R7303 Prediabetes: Secondary | ICD-10-CM

## 2021-07-24 DIAGNOSIS — K219 Gastro-esophageal reflux disease without esophagitis: Secondary | ICD-10-CM | POA: Diagnosis not present

## 2021-07-24 MED ORDER — RIVAROXABAN 20 MG PO TABS
20.0000 mg | ORAL_TABLET | Freq: Every day | ORAL | 3 refills | Status: DC
  Filled 2021-07-24: qty 90, 90d supply, fill #0
  Filled 2021-11-06: qty 90, 90d supply, fill #1

## 2021-07-24 MED ORDER — OMEPRAZOLE 40 MG PO CPDR
40.0000 mg | DELAYED_RELEASE_CAPSULE | Freq: Every day | ORAL | 3 refills | Status: DC
  Filled 2021-07-24: qty 90, 90d supply, fill #0
  Filled 2021-11-06: qty 90, 90d supply, fill #1
  Filled 2022-02-24: qty 90, 90d supply, fill #2
  Filled 2022-06-15: qty 90, 90d supply, fill #3

## 2021-07-24 NOTE — Patient Instructions (Addendum)
It was good to see you again today- continue xarelto 20 mg daily for now.   We can increase your omeprazole to 40 mg   Please let me know if I can help you in any way I am sorry you are dealing with cancer

## 2021-07-25 ENCOUNTER — Other Ambulatory Visit (HOSPITAL_COMMUNITY): Payer: Self-pay

## 2021-07-25 ENCOUNTER — Encounter: Payer: Self-pay | Admitting: Family Medicine

## 2021-07-25 MED ORDER — CLOTRIMAZOLE 10 MG MT TROC
10.0000 mg | Freq: Every day | OROMUCOSAL | 0 refills | Status: DC
  Filled 2021-07-25 (×2): qty 25, 5d supply, fill #0

## 2021-07-25 NOTE — Telephone Encounter (Signed)
Okay for Rx?

## 2021-07-26 ENCOUNTER — Other Ambulatory Visit (HOSPITAL_COMMUNITY): Payer: Self-pay

## 2021-07-29 ENCOUNTER — Telehealth: Payer: Self-pay | Admitting: *Deleted

## 2021-07-29 ENCOUNTER — Other Ambulatory Visit: Payer: Self-pay | Admitting: *Deleted

## 2021-07-29 ENCOUNTER — Other Ambulatory Visit (HOSPITAL_COMMUNITY): Payer: Self-pay

## 2021-07-29 DIAGNOSIS — C20 Malignant neoplasm of rectum: Secondary | ICD-10-CM

## 2021-07-29 DIAGNOSIS — R197 Diarrhea, unspecified: Secondary | ICD-10-CM

## 2021-07-29 MED ORDER — DIPHENOXYLATE-ATROPINE 2.5-0.025 MG PO TABS
2.0000 | ORAL_TABLET | Freq: Four times a day (QID) | ORAL | 1 refills | Status: DC | PRN
  Filled 2021-07-29: qty 20, 3d supply, fill #0
  Filled 2021-07-29: qty 220, 27d supply, fill #0

## 2021-07-29 NOTE — Telephone Encounter (Signed)
This nurse returned patient's phone call regarding diarrhea, pain in back and stomach. She stated,"I've had diarrhea since last Tuesday. Everything I eat comes out as diarrhea. I'm taking Imodium but it isn't working. I've got pain in my back and stomach. I take Motrin for that. I have nausea at times." She denied vomiting, fevers, and being dehydrated. Per Dr. Marin Olp, lets E-scribe Lomotil. Prescription was sent to [facility]. We will check her labs on Wednesday, July 19th with her visit with Dr. Marin Olp. She verbalized understanding.

## 2021-07-30 ENCOUNTER — Other Ambulatory Visit: Payer: Self-pay | Admitting: *Deleted

## 2021-07-30 ENCOUNTER — Other Ambulatory Visit (HOSPITAL_COMMUNITY): Payer: Self-pay

## 2021-07-30 ENCOUNTER — Telehealth: Payer: Self-pay | Admitting: *Deleted

## 2021-07-30 MED ORDER — LORAZEPAM 0.5 MG PO TABS
0.5000 mg | ORAL_TABLET | Freq: Four times a day (QID) | ORAL | 0 refills | Status: DC | PRN
  Filled 2021-07-30: qty 30, 8d supply, fill #0

## 2021-07-30 NOTE — Telephone Encounter (Signed)
Patient called stating that her back and abdominal pain have not subsided.  No diarrhea since yesterday at 6p after taking the Lomotil but woke up at 2 am with abdominal and back pain.  Patient is resting at home with pain level of 4 and feeling some better.  Dr Marin Olp notified.  To see Dr Marin Olp tomorrow and Ativan Rx sent to patients pharmacy.  Patient notified

## 2021-07-31 ENCOUNTER — Inpatient Hospital Stay: Payer: BC Managed Care – PPO

## 2021-07-31 ENCOUNTER — Other Ambulatory Visit (HOSPITAL_COMMUNITY): Payer: Self-pay

## 2021-07-31 ENCOUNTER — Other Ambulatory Visit: Payer: Self-pay | Admitting: *Deleted

## 2021-07-31 ENCOUNTER — Encounter: Payer: Self-pay | Admitting: Hematology & Oncology

## 2021-07-31 ENCOUNTER — Encounter: Payer: Self-pay | Admitting: *Deleted

## 2021-07-31 ENCOUNTER — Inpatient Hospital Stay (HOSPITAL_BASED_OUTPATIENT_CLINIC_OR_DEPARTMENT_OTHER): Payer: BC Managed Care – PPO | Admitting: Hematology & Oncology

## 2021-07-31 ENCOUNTER — Other Ambulatory Visit: Payer: Self-pay

## 2021-07-31 VITALS — BP 91/67 | HR 78

## 2021-07-31 VITALS — BP 106/68 | HR 105 | Temp 98.0°F | Resp 18 | Ht 67.0 in | Wt 287.0 lb

## 2021-07-31 DIAGNOSIS — C20 Malignant neoplasm of rectum: Secondary | ICD-10-CM

## 2021-07-31 DIAGNOSIS — E876 Hypokalemia: Secondary | ICD-10-CM

## 2021-07-31 DIAGNOSIS — Z5111 Encounter for antineoplastic chemotherapy: Secondary | ICD-10-CM | POA: Diagnosis not present

## 2021-07-31 DIAGNOSIS — Z452 Encounter for adjustment and management of vascular access device: Secondary | ICD-10-CM | POA: Diagnosis not present

## 2021-07-31 DIAGNOSIS — Z7901 Long term (current) use of anticoagulants: Secondary | ICD-10-CM | POA: Diagnosis not present

## 2021-07-31 DIAGNOSIS — R300 Dysuria: Secondary | ICD-10-CM | POA: Diagnosis not present

## 2021-07-31 DIAGNOSIS — Z87891 Personal history of nicotine dependence: Secondary | ICD-10-CM | POA: Diagnosis not present

## 2021-07-31 DIAGNOSIS — I82462 Acute embolism and thrombosis of left calf muscular vein: Secondary | ICD-10-CM | POA: Diagnosis not present

## 2021-07-31 LAB — CMP (CANCER CENTER ONLY)
ALT: 36 U/L (ref 0–44)
AST: 32 U/L (ref 15–41)
Albumin: 4 g/dL (ref 3.5–5.0)
Alkaline Phosphatase: 92 U/L (ref 38–126)
Anion gap: 10 (ref 5–15)
BUN: 11 mg/dL (ref 6–20)
CO2: 22 mmol/L (ref 22–32)
Calcium: 8.9 mg/dL (ref 8.9–10.3)
Chloride: 103 mmol/L (ref 98–111)
Creatinine: 0.85 mg/dL (ref 0.44–1.00)
GFR, Estimated: >60 mL/min (ref >60)
Glucose, Bld: 177 mg/dL — ABNORMAL HIGH (ref 70–99)
Potassium: 2.9 mmol/L — ABNORMAL LOW (ref 3.5–5.1)
Sodium: 135 mmol/L (ref 135–145)
Total Bilirubin: 0.4 mg/dL (ref 0.3–1.2)
Total Protein: 6.9 g/dL (ref 6.5–8.1)

## 2021-07-31 LAB — CBC WITH DIFFERENTIAL (CANCER CENTER ONLY)
Abs Immature Granulocytes: 0.03 10*3/uL (ref 0.00–0.07)
Basophils Absolute: 0 10*3/uL (ref 0.0–0.1)
Basophils Relative: 0 %
Eosinophils Absolute: 0.3 10*3/uL (ref 0.0–0.5)
Eosinophils Relative: 5 %
HCT: 34.5 % — ABNORMAL LOW (ref 36.0–46.0)
Hemoglobin: 11.5 g/dL — ABNORMAL LOW (ref 12.0–15.0)
Immature Granulocytes: 1 %
Lymphocytes Relative: 18 %
Lymphs Abs: 1.1 10*3/uL (ref 0.7–4.0)
MCH: 30.7 pg (ref 26.0–34.0)
MCHC: 33.3 g/dL (ref 30.0–36.0)
MCV: 92.2 fL (ref 80.0–100.0)
Monocytes Absolute: 0.8 10*3/uL (ref 0.1–1.0)
Monocytes Relative: 12 %
Neutro Abs: 3.9 10*3/uL (ref 1.7–7.7)
Neutrophils Relative %: 64 %
Platelet Count: 174 10*3/uL (ref 150–400)
RBC: 3.74 MIL/uL — ABNORMAL LOW (ref 3.87–5.11)
RDW: 18.8 % — ABNORMAL HIGH (ref 11.5–15.5)
WBC Count: 6.1 10*3/uL (ref 4.0–10.5)
nRBC: 0 % (ref 0.0–0.2)

## 2021-07-31 LAB — LACTATE DEHYDROGENASE: LDH: 298 U/L — ABNORMAL HIGH (ref 98–192)

## 2021-07-31 MED ORDER — POTASSIUM CHLORIDE CRYS ER 20 MEQ PO TBCR
40.0000 meq | EXTENDED_RELEASE_TABLET | Freq: Two times a day (BID) | ORAL | 1 refills | Status: DC
  Filled 2021-07-31: qty 120, 30d supply, fill #0
  Filled 2021-09-06: qty 120, 30d supply, fill #1

## 2021-07-31 MED ORDER — SODIUM CHLORIDE 0.9 % IV SOLN: INTRAVENOUS | Status: AC

## 2021-07-31 MED ORDER — SODIUM CHLORIDE 0.9% FLUSH
10.0000 mL | Freq: Once | INTRAVENOUS | Status: AC
  Administered 2021-07-31: 10 mL via INTRAVENOUS

## 2021-07-31 MED ORDER — POTASSIUM CHLORIDE 10 MEQ/100ML IV SOLN
10.0000 meq | INTRAVENOUS | Status: AC
  Administered 2021-07-31 (×4): 10 meq via INTRAVENOUS
  Filled 2021-07-31: qty 100

## 2021-07-31 MED ORDER — HEPARIN SOD (PORK) LOCK FLUSH 100 UNIT/ML IV SOLN
500.0000 [IU] | Freq: Once | INTRAVENOUS | Status: AC
  Administered 2021-07-31: 500 [IU] via INTRAVENOUS

## 2021-07-31 NOTE — Progress Notes (Signed)
Hematology and Oncology Follow Up Visit  Janice Lucas [id number] [birth year] 56 y.o. 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 daily-started on 06/30/2021     Interim History:  Janice Lucas is back for follow-up.  Unfortunately, she just is not tolerating treatment well.  Even with some dosage adjustments of chemotherapy, she is still having a very tough time.  As such, I really think we are going to have to hold any further therapy on her.  Her left leg is doing okay.  She had a blood clot in the left leg.  She is on Xarelto for this.  There is no swelling or pain in the leg.  She is having some back discomfort.  She is having some abdominal discomfort.  She has had no rectal bleeding.  She has had no diarrhea.  She has had no vomiting.  She has had some mouth sores.  Again, I just do not think we would not be able to treat her any further for the rectal cancer in the neoadjuvant setting.  I think going to have to go to radiation therapy and then get her to surgery.  I think that we need to set up with a MRI of the rectum.  Hopefully, with the treatment that she has had, she will have had a response.  She is trying to work.  I did give her credit for doing this.  She has had no fever.  She has had no rashes.  Overall, I would say performance status is probably ECOG 1.   Medications:  Current Outpatient Medications:    acetaminophen (TYLENOL) 500 MG tablet, Take 500 mg by mouth every 6 (six) hours as needed., Disp: , Rfl:    acetaZOLAMIDE ER (DIAMOX) 500 MG capsule, Take 1 capsule twice a day (Patient taking differently: Take 500 mg by mouth once. Take 1 capsule twice a day), Disp: 180 capsule, Rfl: 3   albuterol (VENTOLIN  HFA) 108 (90 Base) MCG/ACT inhaler, Inhale 2 puffs into the lungs every 6 (six) hours as needed for wheezing or shortness of breath., Disp: 1 each, Rfl: 0   Ascorbic Acid (VITAMIN C) 1000 MG tablet, Take 1,000 mg by mouth daily., Disp: , Rfl:    atorvastatin (LIPITOR) 20 MG tablet, Take 1 tablet  by mouth daily., Disp: 90 tablet, Rfl: 3   Biotin 10000 MCG TABS, Take by mouth daily., Disp: , Rfl:    Calcium Carb-Cholecalciferol (CALCIUM 1000 + D PO), Take by mouth 2 (two) times daily., Disp: , Rfl:    capecitabine (XELODA) 500 MG tablet, Take 4 tablets (2,000 mg total) by mouth 2 (two) times daily after a meal. Take twice a day for 14 days on and 7 days off., Disp: 112 tablet, Rfl: 3   cholecalciferol (VITAMIN D3) 25 MCG (1000 UT) tablet, Take 1,000 Units by mouth daily., Disp: , Rfl:    dexamethasone (DECADRON) 4 MG tablet, Take 4 mg by mouth 2 (two) times daily with a meal. TAKE 2 TABLETS BY MOUTH EVERY DAY (START THE DAY AFTER CHEMOTHERAPY FOR 2 DAYS) TAKE WITH FOOD, Disp: , Rfl:    dicyclomine (BENTYL) 10 MG capsule, Take 1 capsule (10 mg total) by mouth 4 (four) times daily -  before meals and  at bedtime., Disp: 90 capsule, Rfl: 1   diphenoxylate-atropine (LOMOTIL) 2.5-0.025 MG tablet, Take 2 tablets by mouth 4 (four) times daily as needed for diarrhea or loose stools., Disp: 1001 tablet, Rfl: 1   fluticasone (FLONASE) 50 MCG/ACT nasal spray, Place 2 sprays into both nostrils daily. (Patient taking differently: Place 2 sprays into both nostrils daily. As needed), Disp: 16 g, Rfl: 6   HYDROcodone bit-homatropine (HYCODAN) 5-1.5 MG/5ML syrup, Take 5 mLs by mouth every 6 (six) hours as needed for cough., Disp: 120 mL, Rfl: 0   lisinopril-hydrochlorothiazide (ZESTORETIC) 20-25 MG tablet, Take 1 tablet by mouth daily., Disp: 90 tablet, Rfl: 3   LORazepam (ATIVAN) 0.5 MG tablet, Take 1 tablet (0.5 mg total) by mouth every 6 (six) hours as needed for anxiety., Disp: 30 tablet, Rfl: 0   nortriptyline  (PAMELOR) 50 MG capsule, TAKE 2 CAPSULES BY MOUTH EVERY NIGHT AS DIRECTED, Disp: 180 capsule, Rfl: 3   omeprazole (PRILOSEC) 40 MG capsule, Take 1 capsule (40 mg total) by mouth daily., Disp: 90 capsule, Rfl: 3   ondansetron (ZOFRAN) 8 MG tablet, Take by mouth every 8 (eight) hours as needed for nausea or vomiting., Disp: , Rfl:    potassium chloride SA (KLOR-CON M) 20 MEQ tablet, Take 2 tablets (40 mEq total) by mouth 2 (two) times daily., Disp: 120 tablet, Rfl: 1   rivaroxaban (XARELTO) 20 MG TABS tablet, Take 1 tablet (20 mg total) by mouth daily with supper., Disp: 90 tablet, Rfl: 3   sertraline (ZOLOFT) 25 MG tablet, Take 12.5 mg by mouth daily., Disp: , Rfl:    topiramate (TOPAMAX) 100 MG tablet, Take 1 tablet (100 mg total) by mouth 2 (two) times daily., Disp: 180 tablet, Rfl: 3   vitamin E 1000 UNIT capsule, Take 1,000 Units by mouth daily., Disp: , Rfl:    clotrimazole (MYCELEX) 10 MG troche, Take 1 tablet (10 mg total) by mouth 5 (five) times daily. Use for 7- 10 days.  Allow to dissolve slowly in mouth (Patient not taking: Reported on 07/31/2021), Disp: 50 Troche, Rfl: 0  Allergies:  Allergies  Allergen Reactions   Neosporin  [Neomycin-Bacitracin Zn-Polymyx] Rash   Sulfa Antibiotics Other (See Comments)    Acts "goofy"    Past Medical History, Surgical history, Social history, and Family History were reviewed and updated.  Review of Systems: Review of Systems  Constitutional: Negative.   HENT:  Negative.    Eyes: Negative.   Respiratory: Negative.    Cardiovascular: Negative.   Gastrointestinal: Negative.   Endocrine: Negative.   Genitourinary: Negative.    Musculoskeletal: Negative.   Skin: Negative.   Neurological: Negative.   Hematological: Negative.   Psychiatric/Behavioral: Negative.      Physical Exam:  height is '5\' 7"'$  (1.702 m) and weight is 287 lb (130.2 kg). Her oral temperature is 98 F (36.7 C). Her blood pressure is 106/68 and her pulse is 105 (abnormal).  Her respiration is 18 and oxygen saturation is 100%.   Wt Readings from Last 3 Encounters:  07/31/21 287 lb (130.2 kg)  07/24/21 294 lb 12.8 oz (133.7 kg)  07/17/21 292 lb (132.5 kg)    Physical Exam Vitals reviewed.  HENT:     Head: Normocephalic and atraumatic.  Eyes:     Pupils: Pupils are equal, round, and reactive to light.  Cardiovascular:     Rate and Rhythm: Normal rate and regular rhythm.     Heart sounds: Normal heart sounds.  Pulmonary:  Effort: Pulmonary effort is normal.     Breath sounds: Normal breath sounds.  Abdominal:     General: Bowel sounds are normal.     Palpations: Abdomen is soft.  Musculoskeletal:        General: No tenderness or deformity. Normal range of motion.     Cervical back: Normal range of motion.     Comments: Lower extremity shows a little bit of swelling and pain in the left lower extremity.  She has a positive Homans' sign in the left leg.  Right leg is unremarkable.  She does have good pulses in her distal extremities.  Lymphadenopathy:     Cervical: No cervical adenopathy.  Skin:    General: Skin is warm and dry.     Findings: No erythema or rash.  Neurological:     Mental Status: She is alert and oriented to person, place, and time.  Psychiatric:        Behavior: Behavior normal.        Thought Content: Thought content normal.        Judgment: Judgment normal.     Lab Results  Component Value Date   WBC 6.1 07/31/2021   HGB 11.5 (L) 07/31/2021   HCT 34.5 (L) 07/31/2021   MCV 92.2 07/31/2021   PLT 174 07/31/2021     Chemistry      Component Value Date/Time   NA 135 07/31/2021 1015   K 2.9 (L) 07/31/2021 1015   CL 103 07/31/2021 1015   CO2 22 07/31/2021 1015   BUN 11 07/31/2021 1015   CREATININE 0.85 07/31/2021 1015   CREATININE 0.84 11/15/2019 1516      Component Value Date/Time   CALCIUM 8.9 07/31/2021 1015   ALKPHOS 92 07/31/2021 1015   AST 32 07/31/2021 1015   ALT 36 07/31/2021 1015   BILITOT 0.4  07/31/2021 1015      Impression and Plan: Ms. Slager is a very charming 56 year old white female.  She has a localized rectal cancer.  By staging studies looks like this is a stage IIA.  She has had 3 out of 4 cycles of chemotherapy with FOLFOX.  Again I just do not think that she is going tolerate a fourth cycle.  We will set her up with the MRI.  I will get this done in a couple weeks.  We will then see if she can be seen by Radiation Oncology.  I really do not think we can give her any chemotherapy with the radiation.  I just do not think that she would tolerate low-dose Xeloda.  I know she is trying hard.  Her body just has not been all that accommodating when he comes to chemotherapy.  As for her thromboembolic disease in the left leg, I probably would not do a Doppler until after Labor Day.  Everything looks much better.  She is on Xarelto and doing well with Xarelto.  I will plan to get her back to see [place] in about 1 month or so.  Again, once we get the MRI, I will make referral over to Radiation Oncology.   Volanda Napoleon, MD 7/19/202311:27 AM

## 2021-07-31 NOTE — Progress Notes (Signed)
Patient's treatment held. She is once again hypokalemic and will receive several runs of potassium. After speaking to Dr Marin Olp about her tolerance of the treatment, Dr Marin Olp will end systemic treatment and move her onto radiation.   She will need a pelvic MRI prior to her consult with RadOnc. Scheduled for 08/14/21.  Oncology Nurse Navigator Documentation     07/31/2021   10:30 AM  Oncology Nurse Navigator Flowsheets  Navigator Follow Up Date: 08/14/2021  Navigator Follow Up Reason: Scan Review  Navigator Location [facility]  Navigator Encounter Type Follow-up Appt;Appt/Treatment Plan Review  Patient Visit Type MedOnc  Treatment Phase Active Tx  Barriers/Navigation Needs Coordination of Care;Education  Interventions Psycho-Social Support  Acuity Level 2-Minimal Needs (1-2 Barriers Identified)  Support Groups/Services Friends and Family  Time Spent with Patient 15

## 2021-07-31 NOTE — Patient Instructions (Addendum)
Hypokalemia Hypokalemia means that the amount of potassium in the blood is lower than normal. Potassium is a mineral (electrolyte) that helps regulate the amount of fluid in the body. It also stimulates muscle tightening (contraction) and helps nerves work properly. Normally, most of the body's potassium is inside cells, and only a very small amount is in the blood. Because the amount in the blood is so small, minor changes to potassium levels in the blood can be life-threatening. What are the causes? This condition may be caused by: Antibiotic medicine. Diarrhea or vomiting. Taking too much of a medicine that helps you have a bowel movement (laxative) can cause diarrhea and lead to hypokalemia. Chronic kidney disease (CKD). Medicines that help the body get rid of excess fluid (diuretics). Eating disorders, such as anorexia or bulimia. Low magnesium levels in the body. Sweating a lot. What are the signs or symptoms? Symptoms of this condition include: Weakness. Constipation. Fatigue. Muscle cramps. Mental confusion. Skipped heartbeats or irregular heartbeat (palpitations). Tingling or numbness. How is this diagnosed? This condition is diagnosed with a blood test. How is this treated? This condition may be treated by: Taking potassium supplements. Adjusting the medicines that you take. Eating more foods that contain a lot of potassium. If your potassium level is very low, you may need to get potassium through an IV and be monitored in the hospital. Follow these instructions at home: Eating and drinking  Eat a healthy diet. A healthy diet includes fresh fruits and vegetables, whole grains, healthy fats, and lean proteins. If told, eat more foods that contain a lot of potassium. These include: Nuts, such as peanuts and pistachios. Seeds, such as sunflower seeds and pumpkin seeds. Peas, lentils, and lima beans. Whole grain and bran cereals and breads. Fresh fruits and vegetables,  such as apricots, avocado, bananas, cantaloupe, kiwi, oranges, tomatoes, asparagus, and potatoes. Juices, such as orange, tomato, and prune. Lean meats, including fish. Milk and milk products, such as yogurt. General instructions Take over-the-counter and prescription medicines only as told by your health care provider. This includes vitamins, natural food products, and supplements. Keep all follow-up visits. This is important. Contact a health care provider if: You have weakness that gets worse. You feel your heart pounding or racing. You vomit. You have diarrhea. You have diabetes and you have trouble keeping your blood sugar in your target range. Get help right away if: You have chest pain. You have shortness of breath. You have vomiting or diarrhea that lasts for more than 2 days. You faint. These symptoms may be an emergency. Get help right away. Call 911. Do not wait to see if the symptoms will go away. Do not drive yourself to the hospital. Summary Hypokalemia means that the amount of potassium in the blood is lower than normal. This condition is diagnosed with a blood test. Hypokalemia may be treated by taking potassium supplements, adjusting the medicines that you take, or eating more foods that are high in potassium. If your potassium level is very low, you may need to get potassium through an IV and be monitored in the hospital. This information is not intended to replace advice given to you by your health care provider. Make sure you discuss any questions you have with your health care provider. Document Revised: 09/13/2020 Document Reviewed: 09/13/2020 Elsevier Patient Education  [place]. Dehydration, Adult Dehydration is a condition in which there is not enough water or other fluids in the body. This happens when a person loses  more fluids than he or she takes in. Important organs, such as the kidneys, brain, and heart, cannot function without a proper amount of  fluids. Any loss of fluids from the body can lead to dehydration. Dehydration can be mild, moderate, or severe. It should be treated right away to prevent it from becoming severe. What are the causes? Dehydration may be caused by: Conditions that cause loss of water or other fluids, such as diarrhea, vomiting, or sweating or urinating a lot. Not drinking enough fluids, especially when you are ill or doing activities that require a lot of energy. Other illnesses and conditions, such as fever or infection. Certain medicines, such as medicines that remove excess fluid from the body (diuretics). Lack of safe drinking water. Not being able to get enough water and food. What increases the risk? The following factors may make you more likely to develop this condition: Having a long-term (chronic) illness that has not been treated properly, such as diabetes, heart disease, or kidney disease. Being 47 years of age or older. Having a disability. Living in a place that is high in altitude, where thinner, drier air causes more fluid loss. Doing exercises that put stress on your body for a long time (endurance sports). What are the signs or symptoms? Symptoms of dehydration depend on how severe it is. Mild or moderate dehydration Thirst. Dry lips or dry mouth. Dizziness or light-headedness, especially when standing up from a seated position. Muscle cramps. Dark urine. Urine may be the color of tea. Less urine or tears produced than usual. Headache. Severe dehydration Changes in skin. Your skin may be cold and clammy, blotchy, or pale. Your skin also may not return to normal after being lightly pinched and released. Little or no tears, urine, or sweat. Changes in vital signs, such as rapid breathing and low blood pressure. Your pulse may be weak or may be faster than 100 beats a minute when you are sitting still. Other changes, such as: Feeling very thirsty. Sunken eyes. Cold hands and  feet. Confusion. Being very tired (lethargic) or having trouble waking from sleep. Short-term weight loss. Loss of consciousness. How is this diagnosed? This condition is diagnosed based on your symptoms and a physical exam. You may have blood and urine tests to help confirm the diagnosis. How is this treated? Treatment for this condition depends on how severe it is. Treatment should be started right away. Do not wait until dehydration becomes severe. Severe dehydration is an emergency and needs to be treated in a hospital. Mild or moderate dehydration can be treated at home. You may be asked to: Drink more fluids. Drink an oral rehydration solution (ORS). This drink helps restore proper amounts of fluids and salts and minerals in the blood (electrolytes). Severe dehydration can be treated: With IV fluids. By correcting abnormal levels of electrolytes. This is often done by giving electrolytes through a tube that is passed through your nose and into your stomach (nasogastric tube, or NG tube). By treating the underlying cause of dehydration. Follow these instructions at home: Oral rehydration solution If told by your health care provider, drink an ORS: Make an ORS by following instructions on the package. Start by drinking small amounts, about  cup (120 mL) every 5-10 minutes. Slowly increase how much you drink until you have taken the amount recommended by your health care provider. Eating and drinking        Drink enough clear fluid to keep your urine pale yellow. If you  were told to drink an ORS, finish the ORS first and then start slowly drinking other clear fluids. Drink fluids such as: Water. Do not drink only water. Doing that can lead to hyponatremia, which is having too little salt (sodium) in the body. Water from ice chips you suck on. Fruit juice that you have added water to (diluted fruit juice). Low-calorie sports drinks. Eat foods that contain a healthy balance of  electrolytes, such as bananas, oranges, potatoes, tomatoes, and spinach. Do not drink alcohol. Avoid the following: Drinks that contain a lot of sugar. These include high-calorie sports drinks, fruit juice that is not diluted, and soda. Caffeine. Foods that are greasy or contain a lot of fat or sugar. General instructions Take over-the-counter and prescription medicines only as told by your health care provider. Do not take sodium tablets. Doing that can lead to having too much sodium in the body (hypernatremia). Return to your normal activities as told by your health care provider. Ask your health care provider what activities are safe for you. Keep all follow-up visits as told by your health care provider. This is important. Contact a health care provider if: You have muscle cramps, pain, or discomfort, such as: Pain in your abdomen and the pain gets worse or stays in one area (localizes). Stiff neck. You have a rash. You are more irritable than usual. You are sleepier or have a harder time waking than usual. You feel weak or dizzy. You feel very thirsty. Get help right away if you have: Any symptoms of severe dehydration. Symptoms of vomiting, such as: You cannot eat or drink without vomiting. Vomiting gets worse or does not go away. Vomit includes blood or green matter (bile). Symptoms that get worse with treatment. A fever. A severe headache. Problems with urination or bowel movements, such as: Diarrhea that gets worse or does not go away. Blood in your stool (feces). This may cause stool to look black and tarry. Not urinating, or urinating only a small amount of very dark urine, within 6-8 hours. Trouble breathing. These symptoms may represent a serious problem that is an emergency. Do not wait to see if the symptoms will go away. Get medical help right away. Call your local emergency services (911 in the [place] Do not drive yourself to the hospital. Summary Dehydration is a  condition in which there is not enough water or other fluids in the body. This happens when a person loses more fluids than he or she takes in. Treatment for this condition depends on how severe it is. Treatment should be started right away. Do not wait until dehydration becomes severe. Drink enough clear fluid to keep your urine pale yellow. If you were told to drink an oral rehydration solution (ORS), finish the ORS first and then start slowly drinking other clear fluids. Take over-the-counter and prescription medicines only as told by your health care provider. Get help right away if you have any symptoms of severe dehydration. This information is not intended to replace advice given to you by your health care provider. Make sure you discuss any questions you have with your health care provider. Document Revised: 08/12/2018 Document Reviewed: 08/12/2018 Elsevier Patient Education  [place].

## 2021-07-31 NOTE — Progress Notes (Signed)
Ok to run 10 meq KCL IV over 45 minutes per Dr Marin Olp

## 2021-07-31 NOTE — Patient Instructions (Signed)

## 2021-08-01 ENCOUNTER — Other Ambulatory Visit (HOSPITAL_COMMUNITY): Payer: Self-pay

## 2021-08-01 ENCOUNTER — Other Ambulatory Visit: Payer: Self-pay | Admitting: *Deleted

## 2021-08-01 MED ORDER — HYDROCODONE BIT-HOMATROP MBR 5-1.5 MG/5ML PO SOLN
5.0000 mL | Freq: Four times a day (QID) | ORAL | 0 refills | Status: DC | PRN
  Filled 2021-08-01: qty 120, 6d supply, fill #0

## 2021-08-02 ENCOUNTER — Inpatient Hospital Stay: Payer: BC Managed Care – PPO

## 2021-08-02 ENCOUNTER — Other Ambulatory Visit (HOSPITAL_COMMUNITY): Payer: Self-pay

## 2021-08-06 ENCOUNTER — Telehealth: Payer: Self-pay | Admitting: Adult Health

## 2021-08-06 ENCOUNTER — Other Ambulatory Visit (HOSPITAL_COMMUNITY): Payer: Self-pay

## 2021-08-06 ENCOUNTER — Other Ambulatory Visit: Payer: Self-pay

## 2021-08-06 MED ORDER — SERTRALINE HCL 25 MG PO TABS
12.5000 mg | ORAL_TABLET | Freq: Every day | ORAL | 0 refills | Status: DC
  Filled 2021-08-06: qty 15, 30d supply, fill #0

## 2021-08-06 NOTE — Telephone Encounter (Signed)
Rx sent 

## 2021-08-06 NOTE — Telephone Encounter (Signed)
Pt's wife, Aimee, LVM at 12:26p.  She is on DPR.  She would like a refill of an antidepressant that the pt takes sent to [facility].  She didn't know the name of the drug only that she takes 1/2 a pill each day.  Next appt 7/27

## 2021-08-08 ENCOUNTER — Encounter: Payer: Self-pay | Admitting: Adult Health

## 2021-08-08 ENCOUNTER — Other Ambulatory Visit (HOSPITAL_COMMUNITY): Payer: Self-pay

## 2021-08-08 ENCOUNTER — Telehealth (INDEPENDENT_AMBULATORY_CARE_PROVIDER_SITE_OTHER): Payer: BC Managed Care – PPO | Admitting: Adult Health

## 2021-08-08 DIAGNOSIS — F4321 Adjustment disorder with depressed mood: Secondary | ICD-10-CM | POA: Diagnosis not present

## 2021-08-08 MED ORDER — SERTRALINE HCL 25 MG PO TABS
12.5000 mg | ORAL_TABLET | Freq: Every day | ORAL | 0 refills | Status: DC
  Filled 2021-08-08: qty 45, 90d supply, fill #0
  Filled 2021-11-11: qty 45, 90d supply, fill #1

## 2021-08-08 NOTE — Progress Notes (Signed)
Janice Lucas [id number] [birth year] 56 y.o.  Virtual Visit via Video Note  I connected with pt @ on 08/08/21 at  8:20 AM EDT by a video enabled telemedicine application and verified that I am speaking with the correct person using two identifiers.   I discussed the limitations of evaluation and management by telemedicine and the availability of in person appointments. The patient expressed understanding and agreed to proceed.  I discussed the assessment and treatment plan with the patient. The patient was provided an opportunity to ask questions and all were answered. The patient agreed with the plan and demonstrated an understanding of the instructions.   The patient was advised to call back or seek an in-person evaluation if the symptoms worsen or if the condition fails to improve as anticipated.  I provided 10 minutes of non-face-to-face time during this encounter.  The patient was located at home.  The provider was located at [place].   Aloha Gell, NP   Subjective:   Patient ID:  Janice Lucas is a 56 y.o. (DOB July 18, 1965) female.  Chief Complaint: No chief complaint on file.   HPI Janice Lucas presents for follow-up of grief reaction.   Describes mood today as "alright". Pleasant. Tearful at times. Mood symptoms - reports depression, anxiety, and irritability at times. Mood is consistent. Stating "I'm doing ok". Takes 1/2 tablet of Zoloft '25mg'$  daily. Currently in treatment for rectal cancer - prognosis good. Family doing well. Stable interest and motivation.Taking medication as prescribed. Energy levels vary. Active, does not have a regular exercise routine.  Enjoys some usual interests and [place] Mar[place]. Lives with wife of 15 years - 4 dogs. Spending time with family. Appetite adequate. Weight gain. Sleeps well most nights. Averages 8 hours. Using CPAP nightly. Focus and concentration stable. Completing tasks. Managing  aspects of household. Working full time 40 to 50 hours a week. Denies SI or HI.  Denies AH or VH.  Previous medication trials: Denies   Review of Systems:  Review of Systems  Musculoskeletal:  Negative for gait problem.  Neurological:  Negative for tremors.  Psychiatric/Behavioral:         Please refer to HPI    Medications: I have reviewed the patient's current medications.  Current Outpatient Medications  Medication Sig Dispense Refill   acetaminophen (TYLENOL) 500 MG tablet Take 500 mg by mouth every 6 (six) hours as needed.     acetaZOLAMIDE ER (DIAMOX) 500 MG capsule Take 1 capsule twice a day (Patient taking differently: Take 500 mg by mouth once. Take 1 capsule twice a day) 180 capsule 3   albuterol (VENTOLIN HFA) 108 (90 Base) MCG/ACT inhaler Inhale 2 puffs into the lungs every 6 (six) hours as needed for wheezing or shortness of breath. 1 each 0   Ascorbic Acid (VITAMIN C) 1000 MG tablet Take 1,000 mg by mouth daily.     atorvastatin (LIPITOR) 20 MG tablet Take 1 tablet  by mouth daily. 90 tablet 3   Biotin 10000 MCG TABS Take by mouth daily.     Calcium Carb-Cholecalciferol (CALCIUM 1000 + D PO) Take by mouth 2 (two) times daily.     capecitabine (XELODA) 500 MG tablet Take 4 tablets (2,000 mg total) by mouth 2 (two) times daily after a meal. Take twice a day for 14 days on and 7 days off. 112 tablet 3   cholecalciferol (VITAMIN D3) 25 MCG (1000 UT) tablet Take 1,000 Units by mouth daily.  clotrimazole (MYCELEX) 10 MG troche Take 1 tablet (10 mg total) by mouth 5 (five) times daily. Use for 7- 10 days.  Allow to dissolve slowly in mouth (Patient not taking: Reported on 07/31/2021) 50 Troche 0   dexamethasone (DECADRON) 4 MG tablet Take 4 mg by mouth 2 (two) times daily with a meal. TAKE 2 TABLETS BY MOUTH EVERY DAY (START THE DAY AFTER CHEMOTHERAPY FOR 2 DAYS) TAKE WITH FOOD     dicyclomine (BENTYL) 10 MG capsule Take 1 capsule (10 mg total) by mouth 4 (four) times daily -   before meals and at bedtime. 90 capsule 1   diphenoxylate-atropine (LOMOTIL) 2.5-0.025 MG tablet Take 2 tablets by mouth 4 (four) times daily as needed for diarrhea or loose stools. 1001 tablet 1   fluticasone (FLONASE) 50 MCG/ACT nasal spray Place 2 sprays into both nostrils daily. (Patient taking differently: Place 2 sprays into both nostrils daily. As needed) 16 g 6   HYDROcodone bit-homatropine (HYCODAN) 5-1.5 MG/5ML syrup Take 5 mLs by mouth every 6 (six) hours as needed for cough. 120 mL 0   lisinopril-hydrochlorothiazide (ZESTORETIC) 20-25 MG tablet Take 1 tablet by mouth daily. 90 tablet 3   LORazepam (ATIVAN) 0.5 MG tablet Take 1 tablet (0.5 mg total) by mouth every 6 (six) hours as needed for anxiety. 30 tablet 0   nortriptyline (PAMELOR) 50 MG capsule TAKE 2 CAPSULES BY MOUTH EVERY NIGHT AS DIRECTED 180 capsule 3   omeprazole (PRILOSEC) 40 MG capsule Take 1 capsule (40 mg total) by mouth daily. 90 capsule 3   ondansetron (ZOFRAN) 8 MG tablet Take by mouth every 8 (eight) hours as needed for nausea or vomiting.     potassium chloride SA (KLOR-CON M) 20 MEQ tablet Take 2 tablets by mouth 2 times daily. 120 tablet 1   rivaroxaban (XARELTO) 20 MG TABS tablet Take 1 tablet (20 mg total) by mouth daily with supper. 90 tablet 3   sertraline (ZOLOFT) 25 MG tablet Take 1/2 tablet by mouth daily. 15 tablet 0   topiramate (TOPAMAX) 100 MG tablet Take 1 tablet (100 mg total) by mouth 2 (two) times daily. 180 tablet 3   vitamin E 1000 UNIT capsule Take 1,000 Units by mouth daily.     No current facility-administered medications for this visit.    Medication Side Effects: None  Allergies:  Allergies  Allergen Reactions   Neosporin  [Neomycin-Bacitracin Zn-Polymyx] Rash   Sulfa Antibiotics Other (See Comments)    Acts "goofy"    Past Medical History:  Diagnosis Date   Allergy    SEASONAL - MILD   GERD (gastroesophageal reflux disease)    ON OMEPRAZOLE   Goals of care,  counseling/discussion 04/26/2021   Hyperlipidemia    ON MEDS   Hypertension    ON MEDS   Rectal cancer ([place]) 04/26/2021   Sleep apnea    WEARS CPAP    Family History  Problem Relation Age of Onset   Prostate cancer Father        METS TO LYMPH NODES AND BLADDER   Diabetes Maternal Aunt    Diabetes Maternal Uncle    Colon cancer Neg Hx    Colon polyps Neg Hx    Esophageal cancer Neg Hx    Rectal cancer Neg Hx    Stomach cancer Neg Hx     Social History   Socioeconomic History   Marital status: Married    Spouse name: Not on file   Number of children: Not on  file   Years of education: Not on file   Highest education level: Not on file  Occupational History   Not on file  Tobacco Use   Smoking status: Former    Packs/day: 1.00    Years: 20.00    Total pack years: 20.00    Types: Cigarettes   Smokeless tobacco: Current   Tobacco comments:    VAPES   Vaping Use   Vaping Use: Every day   Substances: Nicotine, Flavoring  Substance and Sexual Activity   Alcohol use: No    Comment: occassionally   Drug use: Not Currently   Sexual activity: Not Currently  Other Topics Concern   Not on file  Social History Narrative   Right Handed   Lives in a one story home   Drinks a big cup of coffee daily   Social Determinants of Health   Financial Resource Strain: Not on file  Food Insecurity: Not on file  Transportation Needs: Not on file  Physical Activity: Not on file  Stress: Not on file  Social Connections: Not on file  Intimate Partner Violence: Not on file    Past Medical History, Surgical history, Social history, and Family history were reviewed and updated as appropriate.   Please see review of systems for further details on the patient's review from today.   Objective:   Physical Exam:  LMP 01/29/2011   Physical Exam Constitutional:      General: She is not in acute distress. Musculoskeletal:        General: No deformity.  Neurological:     Mental  Status: She is alert and oriented to person, place, and time.     Coordination: Coordination normal.  Psychiatric:        Attention and Perception: Attention and perception normal. She does not perceive auditory or visual hallucinations.        Mood and Affect: Mood normal. Mood is not anxious or depressed. Affect is not labile, blunt, angry or inappropriate.        Speech: Speech normal.        Behavior: Behavior normal.        Thought Content: Thought content normal. Thought content is not paranoid or delusional. Thought content does not include homicidal or suicidal ideation. Thought content does not include homicidal or suicidal plan.        Cognition and Memory: Cognition and memory normal.        Judgment: Judgment normal.     Comments: Insight intact     Lab Review:     Component Value Date/Time   NA 135 07/31/2021 1015   K 2.9 (L) 07/31/2021 1015   CL 103 07/31/2021 1015   CO2 22 07/31/2021 1015   GLUCOSE 177 (H) 07/31/2021 1015   BUN 11 07/31/2021 1015   CREATININE 0.85 07/31/2021 1015   CREATININE 0.84 11/15/2019 1516   CALCIUM 8.9 07/31/2021 1015   PROT 6.9 07/31/2021 1015   ALBUMIN 4.0 07/31/2021 1015   AST 32 07/31/2021 1015   ALT 36 07/31/2021 1015   ALKPHOS 92 07/31/2021 1015   BILITOT 0.4 07/31/2021 1015   GFRNONAA >60 07/31/2021 1015   GFRAA >90 02/14/2011 1019       Component Value Date/Time   WBC 6.1 07/31/2021 1015   WBC 8.6 06/29/2021 2208   RBC 3.74 (L) 07/31/2021 1015   HGB 11.5 (L) 07/31/2021 1015   HCT 34.5 (L) 07/31/2021 1015   PLT 174 07/31/2021 1015   MCV 92.2  07/31/2021 1015   MCH 30.7 07/31/2021 1015   MCHC 33.3 07/31/2021 1015   RDW 18.8 (H) 07/31/2021 1015   LYMPHSABS 1.1 07/31/2021 1015   MONOABS 0.8 07/31/2021 1015   EOSABS 0.3 07/31/2021 1015   BASOSABS 0.0 07/31/2021 1015    No results found for: "POCLITH", "LITHIUM"   No results found for: "PHENYTOIN", "PHENOBARB", "VALPROATE", "CBMZ"   .res Assessment: Plan:     Plan:  PDMP reviewed  Zoloft '25mg'$  - Taking 1/2 one tablet daily.  Patient advised to contact office with any questions, adverse effects, or acute worsening in signs and symptoms.   Time spent with patient was 10 minutes. Greater than 50% of face to face time with patient was spent on counseling and coordination of care.   RTC 3 months  There are no diagnoses linked to this encounter.   Please see After Visit Summary for patient specific instructions.  Future Appointments  Date Time Provider [place]  08/08/2021  8:20 AM Kayde Warehime, Berdie Ogren, NP [facility] None  08/14/2021  3:00 PM [facility] 1 [facility] [place]  08/14/2021  4:00 PM [facility] 1 [facility] [place]  08/28/2021  2:45 PM [facility] None  08/28/2021  3:00 PM [facility] INJ NURSE [facility] None  08/28/2021  3:15 PM Ennever, Rudell Cobb, MD [facility] None  10/09/2021  3:00 PM Cameron Sprang, MD [facility] None    No orders of the defined types were placed in this encounter.     -------------------------------

## 2021-08-09 ENCOUNTER — Emergency Department (HOSPITAL_BASED_OUTPATIENT_CLINIC_OR_DEPARTMENT_OTHER): Payer: BC Managed Care – PPO

## 2021-08-09 ENCOUNTER — Encounter (HOSPITAL_BASED_OUTPATIENT_CLINIC_OR_DEPARTMENT_OTHER): Payer: Self-pay

## 2021-08-09 ENCOUNTER — Telehealth: Payer: Self-pay | Admitting: *Deleted

## 2021-08-09 ENCOUNTER — Emergency Department (HOSPITAL_BASED_OUTPATIENT_CLINIC_OR_DEPARTMENT_OTHER)
Admission: EM | Admit: 2021-08-09 | Discharge: 2021-08-09 | Disposition: A | Discharge status: Home / Self Care | Payer: BC Managed Care – PPO | Attending: Emergency Medicine | Admitting: Emergency Medicine

## 2021-08-09 ENCOUNTER — Other Ambulatory Visit: Payer: Self-pay

## 2021-08-09 DIAGNOSIS — Z79899 Other long term (current) drug therapy: Secondary | ICD-10-CM | POA: Diagnosis not present

## 2021-08-09 DIAGNOSIS — M79662 Pain in left lower leg: Secondary | ICD-10-CM | POA: Diagnosis not present

## 2021-08-09 DIAGNOSIS — M79605 Pain in left leg: Secondary | ICD-10-CM

## 2021-08-09 DIAGNOSIS — M7989 Other specified soft tissue disorders: Secondary | ICD-10-CM | POA: Diagnosis not present

## 2021-08-09 NOTE — ED Provider Notes (Signed)
[facility] EMERGENCY DEPARTMENT Provider Note   CSN: [id number] Arrival date & time: 08/09/21  1818     History  Chief Complaint  Patient presents with   Leg Pain    Janice Lucas is a 56 y.o. female.  Patient complains of swelling to her left lower leg.  Patient reports she was diagnosed with a DVT 6 weeks ago.  Patient is currently on Xarelto.  Patient called her doctor who advised her to come to the emergency department for ultrasound.  Patient denies any chest pain she denies any shortness of breath.  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 No language interpreter was used.  Leg Pain Location:  Leg Injury: no   Pain details:    Quality:  Aching   Radiates to:  Does not radiate Prior injury to area:  No      Home Medications Prior to Admission medications   Medication Sig Start Date End Date Taking? Authorizing Provider  acetaminophen (TYLENOL) 500 MG tablet Take 500 mg by mouth every 6 (six) hours as needed.    [provider]  acetaZOLAMIDE ER (DIAMOX) 500 MG capsule Take 1 capsule twice a day Patient taking differently: Take 500 mg by mouth once. Take 1 capsule twice a day 06/27/21   Cameron Sprang, MD  albuterol (VENTOLIN HFA) 108 (90 Base) MCG/ACT inhaler Inhale 2 puffs into the lungs every 6 (six) hours as needed for wheezing or shortness of breath. 11/30/19   Copland, Gay Filler, MD  Ascorbic Acid (VITAMIN C) 1000 MG tablet Take 1,000 mg by mouth daily.    [provider]  atorvastatin (LIPITOR) 20 MG tablet Take 1 tablet  by mouth daily. 02/14/21   Copland, Gay Filler, MD  Biotin 10000 MCG TABS Take by mouth daily.    [provider]  Calcium Carb-Cholecalciferol (CALCIUM 1000 + D PO) Take by mouth 2 (two) times daily.    [provider]  capecitabine (XELODA) 500 MG tablet Take 4 tablets (2,000 mg  total) by mouth 2 (two) times daily after a meal. Take twice a day for 14 days on and 7 days off. 04/26/21   Volanda Napoleon, MD  cholecalciferol (VITAMIN D3) 25 MCG (1000 UT) tablet Take 1,000 Units by mouth daily.    [provider]  clotrimazole (MYCELEX) 10 MG troche Take 1 tablet (10 mg total) by mouth 5 (five) times daily. Use for 7- 10 days.  Allow to dissolve slowly in mouth Patient not taking: Reported on 07/31/2021 07/25/21   Copland, Gay Filler, MD  dexamethasone (DECADRON) 4 MG tablet Take 4 mg by mouth 2 (two) times daily with a meal. TAKE 2 TABLETS BY MOUTH EVERY DAY (START THE DAY AFTER CHEMOTHERAPY FOR 2 DAYS) TAKE WITH FOOD    [provider]  dicyclomine (BENTYL) 10 MG capsule Take 1 capsule (10 mg total) by mouth 4 (four) times daily -  before meals and at bedtime. 05/08/21   Volanda Napoleon, MD  diphenoxylate-atropine (LOMOTIL) 2.5-0.025 MG tablet Take 2 tablets by mouth 4 (four) times daily as needed for diarrhea or loose stools. 07/29/21   Volanda Napoleon, MD  fluticasone (FLONASE) 50 MCG/ACT nasal spray Place 2 sprays into both nostrils daily. Patient taking differently: Place 2 sprays into both nostrils daily. As needed 02/11/21   Copland,  Gay Filler, MD  HYDROcodone bit-homatropine (HYCODAN) 5-1.5 MG/5ML syrup Take 5 mLs by mouth every 6 (six) hours as needed for cough. 08/01/21   Volanda Napoleon, MD  lisinopril-hydrochlorothiazide (ZESTORETIC) 20-25 MG tablet Take 1 tablet by mouth daily. 02/11/21   Copland, Gay Filler, MD  LORazepam (ATIVAN) 0.5 MG tablet Take 1 tablet (0.5 mg total) by mouth every 6 (six) hours as needed for anxiety. 07/30/21   Volanda Napoleon, MD  nortriptyline (PAMELOR) 50 MG capsule TAKE 2 CAPSULES BY MOUTH EVERY NIGHT AS DIRECTED 06/27/21   Cameron Sprang, MD  omeprazole (PRILOSEC) 40 MG capsule Take 1 capsule (40 mg total) by mouth daily. 07/24/21   Copland, Gay Filler, MD  ondansetron (ZOFRAN) 8 MG tablet Take by mouth every 8 (eight) hours as  needed for nausea or vomiting.    [provider]  potassium chloride SA (KLOR-CON M) 20 MEQ tablet Take 2 tablets by mouth 2 times daily. 07/31/21   Volanda Napoleon, MD  rivaroxaban (XARELTO) 20 MG TABS tablet Take 1 tablet (20 mg total) by mouth daily with supper. 07/24/21   Copland, Gay Filler, MD  sertraline (ZOLOFT) 25 MG tablet Take 1/2 tablet by mouth daily. 08/08/21   Mozingo, Berdie Ogren, NP  topiramate (TOPAMAX) 100 MG tablet Take 1 tablet (100 mg total) by mouth 2 (two) times daily. 06/27/21   Cameron Sprang, MD  vitamin E 1000 UNIT capsule Take 1,000 Units by mouth daily.    [provider]  prochlorperazine (COMPAZINE) 10 MG tablet Take 1 tablet (10 mg total) by mouth every 6 (six) hours as needed (Nausea or vomiting). 04/29/21 06/04/21  Volanda Napoleon, MD      Allergies    Neosporin  [neomycin-bacitracin zn-polymyx] and Sulfa antibiotics    Review of Systems   Review of Systems  All other systems reviewed and are negative.   Physical Exam Updated Vital Signs BP 110/75 (BP Location: Right Arm)   Pulse 88   Temp 97.8 F (36.6 C) (Oral)   Resp 18   Ht '5\' 7"'$  (1.702 m)   Wt 131.5 kg   LMP 01/29/2011   SpO2 99%   BMI 45.42 kg/m  Physical Exam Vitals and nursing note reviewed.  Constitutional:      Appearance: She is well-developed.  HENT:     Head: Normocephalic.  Cardiovascular:     Rate and Rhythm: Normal rate.  Pulmonary:     Effort: Pulmonary effort is normal.  Abdominal:     General: There is no distension.  Musculoskeletal:        General: Swelling and tenderness present.     Cervical back: Normal range of motion.     Comments: Bilateral lower extremities show slight edema.  Neurological:     Mental Status: She is alert and oriented to person, place, and time.  Psychiatric:        Mood and Affect: Mood normal.     ED Results / Procedures / Treatments   Labs (all labs ordered are listed, but only abnormal results are  displayed) Labs Reviewed - No data to display  EKG None  Radiology US Venous Img Lower  Left (DVT Study)  Result Date: 08/09/2021 CLINICAL DATA:  Left lower extremity pain and swelling. EXAM: Left LOWER EXTREMITY VENOUS DOPPLER ULTRASOUND TECHNIQUE: Gray-scale sonography with compression, as well as color and duplex ultrasound, were performed to evaluate the deep venous system(s) from the level of the common femoral vein through the  popliteal and proximal calf veins. COMPARISON:  Ultrasound dated 06/30/2021. FINDINGS: VENOUS Normal compressibility of the common femoral, superficial femoral, and popliteal veins, as well as the visualized calf veins. Visualized portions of profunda femoral vein and great saphenous vein unremarkable. No filling defects to suggest DVT on grayscale or color Doppler imaging. Doppler waveforms show normal direction of venous flow, normal respiratory plasticity and response to augmentation. Limited views of the contralateral common femoral vein are unremarkable. OTHER None. Limitations: none IMPRESSION: Negative. Electronically Signed   By: Anner Crete M.D.   On: 08/09/2021 19:19    Procedures Procedures    Medications Ordered in ED Medications - No data to display  ED Course/ Medical Decision Making/ A&P                           Medical Decision Making Patient complains of swelling in left leg she is concerned for DVT  Amount and/or Complexity of Data Reviewed Independent Historian: spouse    Details: Patient is here with spouse who is supportive and gives history Radiology: ordered and independent interpretation performed. Decision-making details documented in ED Course.    Details: Vascular ultrasound shows no filling defect to suggest DVT  Risk Risk Details: Patient advised to elevate her legs continue her Xarelto, follow-up with her physician for recheck           Final Clinical Impression(s) / ED Diagnoses Final diagnoses:  Left leg  pain    Rx / DC Orders ED Discharge Orders     None     An After Visit Summary was printed and given to the patient.     Fransico Meadow, Hershal Coria 08/09/21 2322    Margette Fast, MD 08/11/21 1610

## 2021-08-09 NOTE — Discharge Instructions (Addendum)
Continue current medications.  Return if any problems.  Elevate your legs.  Avoid excess sodium intake  Follow up with Dr. Marin Olp

## 2021-08-09 NOTE — ED Triage Notes (Signed)
Patient states she was diagnosed with a left leg clot and states it is more swollen than usual. +swelling, circulation intact in triage.

## 2021-08-09 NOTE — Telephone Encounter (Signed)
Message received from patient to inform Dr. Marin Olp that her "leg with the blood clot is swelling up."  Dr. Marin Olp notified.  Call placed back to patient and patient notified to go to the [facility] ER per order of Dr. Marin Olp.  Pt states that she will go to the [facility] ER.

## 2021-08-12 ENCOUNTER — Encounter: Payer: Self-pay | Admitting: *Deleted

## 2021-08-12 ENCOUNTER — Telehealth: Payer: Self-pay

## 2021-08-12 ENCOUNTER — Telehealth: Payer: Self-pay | Admitting: Neurology

## 2021-08-12 NOTE — Telephone Encounter (Signed)
MRI Brain W and With/out Contrast PA number [phone] Josem Kaufmann is good until 09/10/2021

## 2021-08-12 NOTE — Telephone Encounter (Signed)
Tillie Rung from [facility] called and said authorization is needed for patient's MRI today by 1:00 PM.

## 2021-08-12 NOTE — Telephone Encounter (Signed)
Janice Lucas called no answer left a voice mail that the PA number will be placed in her chart [phone]

## 2021-08-14 ENCOUNTER — Ambulatory Visit (HOSPITAL_COMMUNITY)
Admission: RE | Admit: 2021-08-14 | Discharge: 2021-08-14 | Disposition: A | Discharge status: Home / Self Care | Payer: BC Managed Care – PPO | Source: Ambulatory Visit | Attending: Hematology & Oncology | Admitting: Hematology & Oncology

## 2021-08-14 ENCOUNTER — Ambulatory Visit (HOSPITAL_COMMUNITY)
Admission: RE | Admit: 2021-08-14 | Discharge: 2021-08-14 | Disposition: A | Discharge status: Home / Self Care | Payer: BC Managed Care – PPO | Source: Ambulatory Visit | Attending: Neurology | Admitting: Neurology

## 2021-08-14 DIAGNOSIS — C20 Malignant neoplasm of rectum: Secondary | ICD-10-CM | POA: Diagnosis not present

## 2021-08-14 DIAGNOSIS — K6289 Other specified diseases of anus and rectum: Secondary | ICD-10-CM | POA: Diagnosis not present

## 2021-08-14 DIAGNOSIS — G932 Benign intracranial hypertension: Secondary | ICD-10-CM | POA: Insufficient documentation

## 2021-08-14 DIAGNOSIS — R55 Syncope and collapse: Secondary | ICD-10-CM | POA: Insufficient documentation

## 2021-08-14 DIAGNOSIS — R569 Unspecified convulsions: Secondary | ICD-10-CM | POA: Diagnosis not present

## 2021-08-14 MED ORDER — GADOBUTROL 1 MMOL/ML IV SOLN
10.0000 mL | Freq: Once | INTRAVENOUS | Status: AC | PRN
  Administered 2021-08-14: 10 mL via INTRAVENOUS

## 2021-08-15 ENCOUNTER — Telehealth: Payer: Self-pay

## 2021-08-15 ENCOUNTER — Telehealth: Payer: Self-pay | Admitting: *Deleted

## 2021-08-15 NOTE — Telephone Encounter (Signed)
Noted  

## 2021-08-15 NOTE — Telephone Encounter (Signed)
This nurse returned patient's phone call regarding questions from the MRI. All questions answered. Per Dr. Marin Olp, she does NOT need radiation, she can go straight ahead and have surgery. Dr. Marin Olp needs to speak with Dr. Lenise Arena at [facility]. She verbalized understanding.

## 2021-08-20 ENCOUNTER — Encounter: Payer: Self-pay | Admitting: *Deleted

## 2021-08-20 NOTE — Progress Notes (Signed)
Reviewed patient's MRI results. She will now proceed to surgery. Appointment with Dr Morton Stall has been made for 08/22/2021.   Oncology Nurse Navigator Documentation     08/20/2021    9:30 AM  Oncology Nurse Navigator Flowsheets  Navigator Follow Up Date: 08/22/2021  Navigator Follow Up Reason: Review Note  Navigator Location [facility]  Navigator Encounter Type Scan Review  Patient Visit Type MedOnc  Treatment Phase Active Tx  Barriers/Navigation Needs Coordination of Care;Education  Interventions None Required  Acuity Level 2-Minimal Needs (1-2 Barriers Identified)  Support Groups/Services Friends and Family  Time Spent with Patient 15

## 2021-08-22 ENCOUNTER — Encounter: Payer: Self-pay | Admitting: *Deleted

## 2021-08-22 DIAGNOSIS — C20 Malignant neoplasm of rectum: Secondary | ICD-10-CM | POA: Diagnosis not present

## 2021-08-22 NOTE — Progress Notes (Signed)
Patient had her appointment today with Dr Morton Stall in the [facility]. Surgery is planned for 09/18/2021.  Oncology Nurse Navigator Documentation     08/22/2021   12:30 PM  Oncology Nurse Navigator Flowsheets  Planned Course of Treatment Surgery  Phase of Treatment Surgery  Chemotherapy Pending- Reason: Surgeon or Oncologist Initiated  Navigator Follow Up Date: 09/18/2021  Navigator Follow Up Reason: Surgery  Navigator Location [facility]  Navigator Encounter Type Appt/Treatment Plan Review  Patient Visit Type MedOnc  Treatment Phase Active Tx  Barriers/Navigation Needs Coordination of Care;Education  Interventions None Required  Acuity Level 2-Minimal Needs (1-2 Barriers Identified)  Support Groups/Services Friends and Family  Time Spent with Patient 15

## 2021-08-27 ENCOUNTER — Other Ambulatory Visit (HOSPITAL_COMMUNITY): Payer: Self-pay

## 2021-08-28 ENCOUNTER — Encounter: Payer: Self-pay | Admitting: Hematology & Oncology

## 2021-08-28 ENCOUNTER — Inpatient Hospital Stay: Payer: BC Managed Care – PPO

## 2021-08-28 ENCOUNTER — Other Ambulatory Visit: Payer: Self-pay

## 2021-08-28 ENCOUNTER — Inpatient Hospital Stay (HOSPITAL_BASED_OUTPATIENT_CLINIC_OR_DEPARTMENT_OTHER): Payer: BC Managed Care – PPO | Admitting: Hematology & Oncology

## 2021-08-28 ENCOUNTER — Inpatient Hospital Stay: Payer: BC Managed Care – PPO | Attending: Hematology & Oncology

## 2021-08-28 ENCOUNTER — Encounter: Payer: Self-pay | Admitting: *Deleted

## 2021-08-28 VITALS — BP 107/73 | HR 95 | Temp 98.0°F | Resp 18 | Ht 67.0 in | Wt 295.0 lb

## 2021-08-28 DIAGNOSIS — C20 Malignant neoplasm of rectum: Secondary | ICD-10-CM

## 2021-08-28 DIAGNOSIS — E876 Hypokalemia: Secondary | ICD-10-CM | POA: Diagnosis not present

## 2021-08-28 DIAGNOSIS — Z86718 Personal history of other venous thrombosis and embolism: Secondary | ICD-10-CM | POA: Insufficient documentation

## 2021-08-28 DIAGNOSIS — Z79899 Other long term (current) drug therapy: Secondary | ICD-10-CM | POA: Diagnosis not present

## 2021-08-28 DIAGNOSIS — I743 Embolism and thrombosis of arteries of the lower extremities: Secondary | ICD-10-CM | POA: Diagnosis not present

## 2021-08-28 DIAGNOSIS — Z7901 Long term (current) use of anticoagulants: Secondary | ICD-10-CM | POA: Insufficient documentation

## 2021-08-28 DIAGNOSIS — Z5111 Encounter for antineoplastic chemotherapy: Secondary | ICD-10-CM | POA: Diagnosis not present

## 2021-08-28 DIAGNOSIS — R300 Dysuria: Secondary | ICD-10-CM

## 2021-08-28 LAB — CBC WITH DIFFERENTIAL (CANCER CENTER ONLY)
Abs Immature Granulocytes: 0.02 10*3/uL (ref 0.00–0.07)
Basophils Absolute: 0 10*3/uL (ref 0.0–0.1)
Basophils Relative: 0 %
Eosinophils Absolute: 0.8 10*3/uL — ABNORMAL HIGH (ref 0.0–0.5)
Eosinophils Relative: 9 %
HCT: 33.2 % — ABNORMAL LOW (ref 36.0–46.0)
Hemoglobin: 10.8 g/dL — ABNORMAL LOW (ref 12.0–15.0)
Immature Granulocytes: 0 %
Lymphocytes Relative: 24 %
Lymphs Abs: 2 10*3/uL (ref 0.7–4.0)
MCH: 30.9 pg (ref 26.0–34.0)
MCHC: 32.5 g/dL (ref 30.0–36.0)
MCV: 94.9 fL (ref 80.0–100.0)
Monocytes Absolute: 0.5 10*3/uL (ref 0.1–1.0)
Monocytes Relative: 6 %
Neutro Abs: 5.1 10*3/uL (ref 1.7–7.7)
Neutrophils Relative %: 61 %
Platelet Count: 238 10*3/uL (ref 150–400)
RBC: 3.5 MIL/uL — ABNORMAL LOW (ref 3.87–5.11)
RDW: 16.1 % — ABNORMAL HIGH (ref 11.5–15.5)
WBC Count: 8.4 10*3/uL (ref 4.0–10.5)
nRBC: 0 % (ref 0.0–0.2)

## 2021-08-28 LAB — CMP (CANCER CENTER ONLY)
ALT: 11 U/L (ref 0–44)
AST: 13 U/L — ABNORMAL LOW (ref 15–41)
Albumin: 4.1 g/dL (ref 3.5–5.0)
Alkaline Phosphatase: 86 U/L (ref 38–126)
Anion gap: 9 (ref 5–15)
BUN: 18 mg/dL (ref 6–20)
CO2: 23 mmol/L (ref 22–32)
Calcium: 9 mg/dL (ref 8.9–10.3)
Chloride: 106 mmol/L (ref 98–111)
Creatinine: 0.88 mg/dL (ref 0.44–1.00)
GFR, Estimated: >60 mL/min (ref >60)
Glucose, Bld: 150 mg/dL — ABNORMAL HIGH (ref 70–99)
Potassium: 3 mmol/L — ABNORMAL LOW (ref 3.5–5.1)
Sodium: 138 mmol/L (ref 135–145)
Total Bilirubin: 0.3 mg/dL (ref 0.3–1.2)
Total Protein: 7.5 g/dL (ref 6.5–8.1)

## 2021-08-28 LAB — CEA (IN HOUSE-CHCC): CEA (CHCC-In House): 3.44 ng/mL (ref 0.00–5.00)

## 2021-08-28 MED ORDER — SODIUM CHLORIDE 0.9% FLUSH
10.0000 mL | Freq: Once | INTRAVENOUS | Status: AC
  Administered 2021-08-28: 10 mL via INTRAVENOUS

## 2021-08-28 MED ORDER — HEPARIN SOD (PORK) LOCK FLUSH 100 UNIT/ML IV SOLN
500.0000 [IU] | Freq: Once | INTRAVENOUS | Status: AC
  Administered 2021-08-28: 500 [IU] via INTRAVENOUS

## 2021-08-28 NOTE — Progress Notes (Signed)
Hematology and Oncology Follow Up Visit  Janice Lucas [id number] [birth year] 56 y.o. 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 daily-started on 06/30/2021     Interim History:  Janice Lucas is back for follow-up.  The real good news is that she is going to have surgery for her rectal cancer on September 6.  She has had a very nice response to the chemotherapy.  We did do a MRI of the pelvis.  This was done on 08/14/2021.  The MRI showed a very nice response.  There really was no nodularity noted.  There are some mild stranding along the left lateral rectal wall.  There is no adenopathy.  Because of this result, I thought that we get a right to surgery.  She saw Dr. Morton Lucas at [facility].  He agrees.  He will take her to surgery.  She is on Xarelto.  She is to stop the Xarelto 3 days before she has her surgery.  Of note, she had a Doppler that was done on 08/09/2021.  There is no evidence of thrombus in the left leg.  She has had no problems with her bowels.  There is no bleeding.  She has had no nausea or vomiting.  She has had no cough or shortness of breath.  She has had no fever.  There is been no nausea or vomiting.  She really, really had a tough time with treatment.  For right now, I would have to say that her performance status is probably ECOG 1.    Medications:  Current Outpatient Medications:    acetaminophen (TYLENOL) 500 MG tablet, Take 500 mg by mouth every 6 (six) hours as needed., Disp: , Rfl:    acetaZOLAMIDE ER (DIAMOX) 500 MG capsule, Take 1 capsule twice a day (Patient taking differently: Take 500 mg by mouth once. Take 1 capsule twice a day), Disp: 180 capsule, Rfl: 3   albuterol (VENTOLIN HFA) 108 (90 Base)  MCG/ACT inhaler, Inhale 2 puffs into the lungs every 6 (six) hours as needed for wheezing or shortness of breath., Disp: 1 each, Rfl: 0   Ascorbic Acid (VITAMIN C) 1000 MG tablet, Take 1,000 mg by mouth daily., Disp: , Rfl:    atorvastatin (LIPITOR) 20 MG tablet, Take 1 tablet  by mouth daily., Disp: 90 tablet, Rfl: 3   Biotin 10000 MCG TABS, Take by mouth daily., Disp: , Rfl:    Calcium Carb-Cholecalciferol (CALCIUM 1000 + D PO), Take by mouth 2 (two) times daily., Disp: , Rfl:    capecitabine (XELODA) 500 MG tablet, Take 4 tablets (2,000 mg total) by mouth 2 (two) times daily after a meal. Take twice a day for 14 days on and 7 days off., Disp: 112 tablet, Rfl: 3   cholecalciferol (VITAMIN D3) 25 MCG (1000 UT) tablet, Take 1,000 Units by mouth daily., Disp: , Rfl:    dicyclomine (BENTYL) 10 MG capsule, Take 1 capsule (10 mg total) by mouth 4 (four) times daily -  before meals and at bedtime., Disp: 90 capsule, Rfl: 1   fluticasone (FLONASE) 50 MCG/ACT nasal spray, Place 2 sprays into both nostrils daily. (Patient taking differently: Place 2 sprays into both nostrils daily. As needed), Disp: 16 g, Rfl: 6   HYDROcodone bit-homatropine (  HYCODAN) 5-1.5 MG/5ML syrup, Take 5 mLs by mouth every 6 (six) hours as needed for cough., Disp: 120 mL, Rfl: 0   lisinopril-hydrochlorothiazide (ZESTORETIC) 20-25 MG tablet, Take 1 tablet by mouth daily., Disp: 90 tablet, Rfl: 3   LORazepam (ATIVAN) 0.5 MG tablet, Take 1 tablet (0.5 mg total) by mouth every 6 (six) hours as needed for anxiety., Disp: 30 tablet, Rfl: 0   nortriptyline (PAMELOR) 50 MG capsule, TAKE 2 CAPSULES BY MOUTH EVERY NIGHT AS DIRECTED, Disp: 180 capsule, Rfl: 3   omeprazole (PRILOSEC) 40 MG capsule, Take 1 capsule (40 mg total) by mouth daily., Disp: 90 capsule, Rfl: 3   potassium chloride SA (KLOR-CON M) 20 MEQ tablet, Take 2 tablets by mouth 2 times daily., Disp: 120 tablet, Rfl: 1   rivaroxaban (XARELTO) 20 MG TABS tablet, Take 1 tablet (20 mg  total) by mouth daily with supper., Disp: 90 tablet, Rfl: 3   sertraline (ZOLOFT) 25 MG tablet, Take 1/2 tablet by mouth daily., Disp: 90 tablet, Rfl: 0   topiramate (TOPAMAX) 100 MG tablet, Take 1 tablet (100 mg total) by mouth 2 (two) times daily., Disp: 180 tablet, Rfl: 3   vitamin E 1000 UNIT capsule, Take 1,000 Units by mouth daily., Disp: , Rfl:    clotrimazole (MYCELEX) 10 MG troche, Take 1 tablet (10 mg total) by mouth 5 (five) times daily. Use for 7- 10 days.  Allow to dissolve slowly in mouth (Patient not taking: Reported on 07/31/2021), Disp: 50 Troche, Rfl: 0   dexamethasone (DECADRON) 4 MG tablet, Take 4 mg by mouth 2 (two) times daily with a meal. TAKE 2 TABLETS BY MOUTH EVERY DAY (START THE DAY AFTER CHEMOTHERAPY FOR 2 DAYS) TAKE WITH FOOD (Patient not taking: Reported on 08/28/2021), Disp: , Rfl:    diphenoxylate-atropine (LOMOTIL) 2.5-0.025 MG tablet, Take 2 tablets by mouth 4 (four) times daily as needed for diarrhea or loose stools. (Patient not taking: Reported on 08/28/2021), Disp: 1001 tablet, Rfl: 1   ondansetron (ZOFRAN) 8 MG tablet, Take by mouth every 8 (eight) hours as needed for nausea or vomiting. (Patient not taking: Reported on 08/28/2021), Disp: , Rfl:   Allergies:  Allergies  Allergen Reactions   Neosporin  [Neomycin-Bacitracin Zn-Polymyx] Rash   Sulfa Antibiotics Other (See Comments)    Acts "goofy"    Past Medical History, Surgical history, Social history, and Family History were reviewed and updated.  Review of Systems: Review of Systems  Constitutional: Negative.   HENT:  Negative.    Eyes: Negative.   Respiratory: Negative.    Cardiovascular: Negative.   Gastrointestinal: Negative.   Endocrine: Negative.   Genitourinary: Negative.    Musculoskeletal: Negative.   Skin: Negative.   Neurological: Negative.   Hematological: Negative.   Psychiatric/Behavioral: Negative.      Physical Exam:  height is '5\' 7"'$  (1.702 m) and weight is 295 lb (133.8 kg).  Her oral temperature is 98 F (36.7 C). Her blood pressure is 107/73 and her pulse is 95. Her respiration is 18 and oxygen saturation is 100%.   Wt Readings from Last 3 Encounters:  08/28/21 295 lb (133.8 kg)  08/09/21 290 lb (131.5 kg)  07/31/21 287 lb (130.2 kg)    Physical Exam Vitals reviewed.  HENT:     Head: Normocephalic and atraumatic.  Eyes:     Pupils: Pupils are equal, round, and reactive to light.  Cardiovascular:     Rate and Rhythm: Normal rate and regular rhythm.  Heart sounds: Normal heart sounds.  Pulmonary:     Effort: Pulmonary effort is normal.     Breath sounds: Normal breath sounds.  Abdominal:     General: Bowel sounds are normal.     Palpations: Abdomen is soft.  Musculoskeletal:        General: No tenderness or deformity. Normal range of motion.     Cervical back: Normal range of motion.     Comments: Lower extremity shows a little bit of swelling and pain in the left lower extremity.  She has a positive Homans' sign in the left leg.  Right leg is unremarkable.  She does have good pulses in her distal extremities.  Lymphadenopathy:     Cervical: No cervical adenopathy.  Skin:    General: Skin is warm and dry.     Findings: No erythema or rash.  Neurological:     Mental Status: She is alert and oriented to person, place, and time.  Psychiatric:        Behavior: Behavior normal.        Thought Content: Thought content normal.        Judgment: Judgment normal.     Lab Results  Component Value Date   WBC 8.4 08/28/2021   HGB 10.8 (L) 08/28/2021   HCT 33.2 (L) 08/28/2021   MCV 94.9 08/28/2021   PLT 238 08/28/2021     Chemistry      Component Value Date/Time   NA 135 07/31/2021 1015   K 2.9 (L) 07/31/2021 1015   CL 103 07/31/2021 1015   CO2 22 07/31/2021 1015   BUN 11 07/31/2021 1015   CREATININE 0.85 07/31/2021 1015   CREATININE 0.84 11/15/2019 1516      Component Value Date/Time   CALCIUM 8.9 07/31/2021 1015   ALKPHOS 92  07/31/2021 1015   AST 32 07/31/2021 1015   ALT 36 07/31/2021 1015   BILITOT 0.4 07/31/2021 1015      Impression and Plan: Ms. Chavis is a very charming 56 year old white female.  She has a localized rectal cancer.  By staging studies looks like this is a stage IIA.  She has had 3 out of 4 cycles of chemotherapy with FOLFOX.  Because of toxicity, she cannot tolerate a fourth cycle.  Regardless, she has had a very nice response to treatment.  She does not need any radiation therapy.  I have to believe that she will have stage I rectal cancer when the final pathology comes back.  I am happy that she had no obvious blood clot in the left leg with her last Doppler.  I think be worthwhile to repeat a Doppler right before her surgery just to make sure that everything looks fine in the left leg.  I will plan to get her back to see me probably about a month or so after her surgery.  I cannot imagine that she is going to need any additional treatment.    Volanda Napoleon, MD 8/16/20233:16 PM

## 2021-08-28 NOTE — Patient Instructions (Signed)

## 2021-08-29 NOTE — Progress Notes (Signed)
Oncology Nurse Navigator Documentation     08/28/2021    3:30 PM  Oncology Nurse Navigator Flowsheets  Navigator Follow Up Date: 09/18/2021  Navigator Follow Up Reason: Surgery  Navigator Location [facility]  Navigator Encounter Type Appt/Treatment Plan Review  Patient Visit Type MedOnc  Treatment Phase Active Tx  Barriers/Navigation Needs Coordination of Care;Education  Interventions None Required  Acuity Level 2-Minimal Needs (1-2 Barriers Identified)  Support Groups/Services Friends and Family  Time Spent with Patient 15

## 2021-09-02 ENCOUNTER — Other Ambulatory Visit (HOSPITAL_COMMUNITY): Payer: Self-pay

## 2021-09-04 ENCOUNTER — Telehealth: Payer: Self-pay

## 2021-09-04 ENCOUNTER — Telehealth: Payer: Self-pay | Admitting: Neurology

## 2021-09-04 NOTE — Telephone Encounter (Signed)
Called pt and was given answer to question per Dr. Delice Lesch. She was like a letter put on My Chart.

## 2021-09-04 NOTE — Telephone Encounter (Signed)
She had scans done. She was told if they come back negative she can drive again.  She has not heard back from [place] and would like to speak with her about getting her license back

## 2021-09-04 NOTE — Telephone Encounter (Signed)
Pls make sure she is feeling fine with no further passing out or feeling like she will pass out. She did not lose her license, did she? I just told her to hold off on driving until tests done. Was her license taken by the [facility]? If not, ok to resume driving if feeling fine. thanks

## 2021-09-05 ENCOUNTER — Encounter: Payer: Self-pay | Admitting: Neurology

## 2021-09-05 NOTE — Telephone Encounter (Signed)
done

## 2021-09-06 ENCOUNTER — Other Ambulatory Visit (HOSPITAL_COMMUNITY): Payer: Self-pay

## 2021-09-06 ENCOUNTER — Ambulatory Visit (HOSPITAL_BASED_OUTPATIENT_CLINIC_OR_DEPARTMENT_OTHER)
Admission: RE | Admit: 2021-09-06 | Discharge: 2021-09-06 | Disposition: A | Discharge status: Home / Self Care | Payer: BC Managed Care – PPO | Source: Ambulatory Visit | Attending: Hematology & Oncology | Admitting: Hematology & Oncology

## 2021-09-06 DIAGNOSIS — I82702 Chronic embolism and thrombosis of unspecified veins of left upper extremity: Secondary | ICD-10-CM | POA: Diagnosis not present

## 2021-09-06 DIAGNOSIS — I743 Embolism and thrombosis of arteries of the lower extremities: Secondary | ICD-10-CM | POA: Insufficient documentation

## 2021-09-09 ENCOUNTER — Encounter: Payer: Self-pay | Admitting: *Deleted

## 2021-09-09 NOTE — Telephone Encounter (Signed)
Spoke with pt who states that she wanted to verify that she should stop the blood thinner on the Sunday prior to sx on 09/18/21 (per Dr E). Advised pt to abide by Dr Dicie Beam instructions.

## 2021-09-11 ENCOUNTER — Other Ambulatory Visit (HOSPITAL_COMMUNITY): Payer: Self-pay

## 2021-09-11 ENCOUNTER — Encounter: Payer: Self-pay | Admitting: Neurology

## 2021-09-12 ENCOUNTER — Other Ambulatory Visit (HOSPITAL_COMMUNITY): Payer: Self-pay

## 2021-09-12 DIAGNOSIS — C20 Malignant neoplasm of rectum: Secondary | ICD-10-CM | POA: Diagnosis not present

## 2021-09-12 MED ORDER — METRONIDAZOLE 500 MG PO TABS
ORAL_TABLET | ORAL | 0 refills | Status: DC
  Filled 2021-09-12: qty 3, 1d supply, fill #0

## 2021-09-12 NOTE — Telephone Encounter (Signed)
Done

## 2021-09-13 HISTORY — PX: COLON SURGERY: SHX602

## 2021-09-18 ENCOUNTER — Telehealth: Payer: Self-pay

## 2021-09-18 ENCOUNTER — Encounter: Payer: Self-pay | Admitting: *Deleted

## 2021-09-18 DIAGNOSIS — C20 Malignant neoplasm of rectum: Secondary | ICD-10-CM | POA: Diagnosis not present

## 2021-09-18 DIAGNOSIS — G4733 Obstructive sleep apnea (adult) (pediatric): Secondary | ICD-10-CM | POA: Diagnosis not present

## 2021-09-18 DIAGNOSIS — Z79899 Other long term (current) drug therapy: Secondary | ICD-10-CM | POA: Diagnosis not present

## 2021-09-18 DIAGNOSIS — F39 Unspecified mood [affective] disorder: Secondary | ICD-10-CM | POA: Diagnosis not present

## 2021-09-18 DIAGNOSIS — Z7901 Long term (current) use of anticoagulants: Secondary | ICD-10-CM | POA: Diagnosis not present

## 2021-09-18 DIAGNOSIS — K219 Gastro-esophageal reflux disease without esophagitis: Secondary | ICD-10-CM | POA: Diagnosis not present

## 2021-09-18 DIAGNOSIS — Z6841 Body Mass Index (BMI) 40.0 and over, adult: Secondary | ICD-10-CM | POA: Diagnosis not present

## 2021-09-18 DIAGNOSIS — R7303 Prediabetes: Secondary | ICD-10-CM | POA: Diagnosis not present

## 2021-09-18 DIAGNOSIS — G8918 Other acute postprocedural pain: Secondary | ICD-10-CM | POA: Diagnosis not present

## 2021-09-18 DIAGNOSIS — E782 Mixed hyperlipidemia: Secondary | ICD-10-CM | POA: Diagnosis not present

## 2021-09-18 DIAGNOSIS — K635 Polyp of colon: Secondary | ICD-10-CM | POA: Diagnosis not present

## 2021-09-18 DIAGNOSIS — Z9221 Personal history of antineoplastic chemotherapy: Secondary | ICD-10-CM | POA: Diagnosis not present

## 2021-09-18 DIAGNOSIS — Z86718 Personal history of other venous thrombosis and embolism: Secondary | ICD-10-CM | POA: Diagnosis not present

## 2021-09-18 DIAGNOSIS — I10 Essential (primary) hypertension: Secondary | ICD-10-CM | POA: Diagnosis not present

## 2021-09-18 DIAGNOSIS — Z87891 Personal history of nicotine dependence: Secondary | ICD-10-CM | POA: Diagnosis not present

## 2021-09-18 DIAGNOSIS — F32A Depression, unspecified: Secondary | ICD-10-CM | POA: Diagnosis not present

## 2021-09-18 NOTE — Telephone Encounter (Signed)
done

## 2021-09-18 NOTE — Telephone Encounter (Signed)
Called and Pt daughter answered she was in surgery at that time.

## 2021-09-18 NOTE — Progress Notes (Signed)
Patient had surgery today at an outside health system. Will follow for path results.   Oncology Nurse Navigator Documentation     09/18/2021   12:30 PM  Oncology Nurse Navigator Flowsheets  Phase of Treatment Surgery  Surgery Actual Start Date: 09/18/2021  Navigator Follow Up Date: 09/23/2021  Navigator Follow Up Reason: Pathology  Navigator Location [facility]  Navigator Encounter Type Appt/Treatment Plan Review  Patient Visit Type MedOnc  Treatment Phase Active Tx  Barriers/Navigation Needs Coordination of Care;Education  Interventions None Required  Acuity Level 2-Minimal Needs (1-2 Barriers Identified)  Support Groups/Services Friends and Family  Time Spent with Patient 15

## 2021-09-27 ENCOUNTER — Encounter: Payer: Self-pay | Admitting: *Deleted

## 2021-09-27 NOTE — Progress Notes (Signed)
Surgical pathology reviewed. No residual malignancy found. Shared with Dr Marin Olp.   Oncology Nurse Navigator Documentation     09/27/2021    8:00 AM  Oncology Nurse Navigator Flowsheets  Navigator Follow Up Date: 10/16/2021  Navigator Follow Up Reason: Follow-up Appointment  Navigator Location [facility]  Navigator Encounter Type Pathology Review  Patient Visit Type MedOnc  Treatment Phase Active Tx  Barriers/Navigation Needs Coordination of Care;Education  Interventions Coordination of Care  Acuity Level 2-Minimal Needs (1-2 Barriers Identified)  Coordination of Care Other  Support Groups/Services Friends and Family  Time Spent with Patient 15

## 2021-10-01 ENCOUNTER — Encounter (HOSPITAL_BASED_OUTPATIENT_CLINIC_OR_DEPARTMENT_OTHER): Payer: Self-pay | Admitting: Emergency Medicine

## 2021-10-01 ENCOUNTER — Emergency Department (HOSPITAL_BASED_OUTPATIENT_CLINIC_OR_DEPARTMENT_OTHER)
Admission: EM | Admit: 2021-10-01 | Discharge: 2021-10-01 | Discharge status: Against Medical Advice | Payer: BC Managed Care – PPO | Attending: Emergency Medicine | Admitting: Emergency Medicine

## 2021-10-01 ENCOUNTER — Other Ambulatory Visit: Payer: Self-pay

## 2021-10-01 DIAGNOSIS — K9403 Colostomy malfunction: Secondary | ICD-10-CM | POA: Diagnosis not present

## 2021-10-01 DIAGNOSIS — Z5321 Procedure and treatment not carried out due to patient leaving prior to being seen by health care provider: Secondary | ICD-10-CM | POA: Insufficient documentation

## 2021-10-01 NOTE — ED Triage Notes (Signed)
Pt states her ostomy bag is leaking that started today. States she ran out of supplies at home and needs assistant with changing the bag.

## 2021-10-08 DIAGNOSIS — C20 Malignant neoplasm of rectum: Secondary | ICD-10-CM | POA: Diagnosis not present

## 2021-10-09 ENCOUNTER — Encounter: Payer: Self-pay | Admitting: Neurology

## 2021-10-09 ENCOUNTER — Other Ambulatory Visit (HOSPITAL_COMMUNITY): Payer: Self-pay

## 2021-10-09 ENCOUNTER — Telehealth (INDEPENDENT_AMBULATORY_CARE_PROVIDER_SITE_OTHER): Payer: BC Managed Care – PPO | Admitting: Neurology

## 2021-10-09 DIAGNOSIS — G43009 Migraine without aura, not intractable, without status migrainosus: Secondary | ICD-10-CM | POA: Diagnosis not present

## 2021-10-09 DIAGNOSIS — G932 Benign intracranial hypertension: Secondary | ICD-10-CM | POA: Diagnosis not present

## 2021-10-09 DIAGNOSIS — R55 Syncope and collapse: Secondary | ICD-10-CM

## 2021-10-09 MED ORDER — ACETAZOLAMIDE ER 500 MG PO CP12
500.0000 mg | ORAL_CAPSULE | Freq: Every day | ORAL | 3 refills | Status: DC
  Filled 2021-10-09: qty 90, 90d supply, fill #0

## 2021-10-09 NOTE — Progress Notes (Signed)
Virtual Visit via Video Note The purpose of this virtual visit is to provide medical care while limiting exposure to the novel coronavirus.    Consent was obtained for video visit:  Yes.   Answered questions that patient had about telehealth interaction:  Yes.   I discussed the limitations, risks, security and privacy concerns of performing an evaluation and management service by telemedicine. I also discussed with the patient that there may be a patient responsible charge related to this service. The patient expressed understanding and agreed to proceed.  Pt location: Home Physician Location: office Name of referring provider:  Copland, Gay Filler, MD I connected with Janice Lucas at patients initiation/request on 10/09/2021 at  3:00 PM EDT by video enabled telemedicine application and verified that I am speaking with the correct person using two identifiers. Pt MRN:  [id number] Pt DOB:  [birth year] Video Participants:  Janice Lucas   History of Present Illness:  The patient had a virtual video visit on 10/09/2021. She was last seen in the neurology clinic 3 months ago. She has a history of pseudotumor cerebri and migraines, however on 06/24/2021 had an episode of loss of consciousness with urinary incontinence. She was cold, clammy, diaphoretic, no post-event confusion. I personally reviewed brain MRI with and without contrast done 08/2021 and EEG done 06/2021 which were normal. Symptoms likely convulsive syncope. She denies any further episodes of loss of consciousness since 06/2021. On her last visit, we also discussed hypokalemia likely due to acetazolamide and reduced dose, she is currently on '500mg'$  qhs, in addition to Topiramate '100mg'$  BID and Nortriptyline '100mg'$  qhs. She denies any headaches even with reduction in dose. Recent eye exam was normal with no report of papilledema. Vision is good. She still has intermittent tingling and numbness in her hands and feet. She reports a little pain  from her GI surgery, she had an ileostomy done earlier this month, with plans for another surgery. 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    History on Initial Assessment 02/08/2016: This is a pleasant 56 yo RH woman with a history of pseudotumor cerebri diagnosed in 2015. Records from [facility] were reviewed, she presented to her eye doctor for a routine exam in April 2015 with a 49-monthhistory of headaches thinking she may need new glasses. She was noted to have mild papilledema, right > left. At that time, headaches were behind her eyes and at the vertex, daily, worsened by bending, lifting, coughing, and sneezing. MRI and MRV brain were reported as normal. She had a lumbar puncture with opening pressure of 25 in the lateral position. They report problems with her LP, she had a post-spinal tap headache that initial blood patch did not help with. She was started on Topamax and Diamox, and nortriptyline was added for migraine prevention. She was doing very well on this regimen with rare headaches. Her neurologist Dr. KBaltazar Najjarleft the practice and she saw Dr. MErmalene Postinlast October, with note of improvement in eye exam from last visit, no evidence of papilledema, and rare headaches. They discussed reducing some of her medications, Topamax was reduced to '100mg'$  daily. With reduction in Topamax, she noticed an increase in headaches, particularly in the morning. She then lost her insurance and was unable to obtain the Diamox. Without this, headaches became much worse. She describes them as diffuse sharp pain, with occasional photo and phonophobia when severe, no nausea/vomiting. Bending over causes floaters in her vision. She  has intermittent tinnitus (non-pulsatile). She feels her vision is getting worse, she is straining more to use the computer with blurry vision, no loss of vision. No driving difficulties with peripheral vision.    She has been back on the Diamox  after seeing her PCP, currently on uptitration taking '500mg'$  BID, then increasing back to original dose of '1000mg'$  BID. She had some paresthesias on the Diamox, and found that bananas help with this. She denies any dysarthria/dysphagia, neck/back pain, bowel/bladder dysfunction, dizziness, no falls. She reports some numbness in the three fingers of her right hand, they feel cold suddenly ("like ice cubes") and hurt. 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      Current Outpatient Medications on File Prior to Visit  Medication Sig Dispense Refill   acetaminophen (TYLENOL) 500 MG tablet Take 500 mg by mouth every 6 (six) hours as needed.     acetaZOLAMIDE ER (DIAMOX) 500 MG capsule Take 1 capsule twice a day (Patient taking differently: Take 500 mg by mouth once. Take 1 capsule twice a day) 180 capsule 3   Ascorbic Acid (VITAMIN C) 1000 MG tablet Take 1,000 mg by mouth daily.     atorvastatin (LIPITOR) 20 MG tablet Take 1 tablet  by mouth daily. 90 tablet 3   Biotin 10000 MCG TABS Take by mouth daily.     Calcium Carb-Cholecalciferol (CALCIUM 1000 + D PO) Take by mouth 2 (two) times daily.     cholecalciferol (VITAMIN D3) 25 MCG (1000 UT) tablet Take 1,000 Units by mouth daily.     clotrimazole (MYCELEX) 10 MG troche Take 1 tablet (10 mg total) by mouth 5 (five) times daily. Use for 7- 10 days.  Allow to dissolve slowly in mouth 50 Troche 0   dexamethasone (DECADRON) 4 MG tablet Take 4 mg by mouth 2 (two) times daily with a meal. TAKE 2 TABLETS BY MOUTH EVERY DAY (START THE DAY AFTER CHEMOTHERAPY FOR 2 DAYS) TAKE WITH FOOD     dicyclomine (BENTYL) 10 MG capsule Take 1 capsule (10 mg total) by mouth 4 (four) times daily -  before meals and at bedtime. 90 capsule 1   diphenoxylate-atropine (LOMOTIL) 2.5-0.025 MG tablet Take 2 tablets by mouth 4 (four) times daily as needed for diarrhea or loose stools. 1001 tablet 1   fluticasone (FLONASE) 50 MCG/ACT nasal spray  Place 2 sprays into both nostrils daily. (Patient taking differently: Place 2 sprays into both nostrils daily. As needed) 16 g 6   lisinopril-hydrochlorothiazide (ZESTORETIC) 20-25 MG tablet Take 1 tablet by mouth daily. 90 tablet 3   LORazepam (ATIVAN) 0.5 MG tablet Take 1 tablet (0.5 mg total) by mouth every 6 (six) hours as needed for anxiety. 30 tablet 0   nortriptyline (PAMELOR) 50 MG capsule TAKE 2 CAPSULES BY MOUTH EVERY NIGHT AS DIRECTED 180 capsule 3   omeprazole (PRILOSEC) 40 MG capsule Take 1 capsule (40 mg total) by mouth daily. 90 capsule 3   ondansetron (ZOFRAN) 8 MG tablet Take by mouth every 8 (eight) hours as needed for nausea or vomiting.     potassium chloride SA (KLOR-CON M) 20 MEQ tablet Take 2 tablets by mouth 2 times daily. 120 tablet 1   rivaroxaban (XARELTO) 20 MG TABS tablet Take 1 tablet (20 mg total) by mouth daily with supper. 90 tablet 3   sertraline (ZOLOFT) 25 MG tablet Take 1/2 tablet by mouth daily. 90 tablet 0   topiramate (TOPAMAX) 100 MG tablet  Take 1 tablet  by mouth 2  times daily. 180 tablet 3   vitamin E 1000 UNIT capsule Take 1,000 Units by mouth daily.     HYDROcodone bit-homatropine (HYCODAN) 5-1.5 MG/5ML syrup Take 5 mLs by mouth every 6 (six) hours as needed for cough. (Patient not taking: Reported on 10/09/2021) 120 mL 0   [DISCONTINUED] prochlorperazine (COMPAZINE) 10 MG tablet Take 1 tablet (10 mg total) by mouth every 6 (six) hours as needed (Nausea or vomiting). 30 tablet 1   No current facility-administered medications on file prior to visit.     Observations/Objective:   GEN:  The patient appears stated age and is in NAD.  Neurological examination: Patient is awake, alert. No aphasia or dysarthria. Intact fluency and comprehension. Cranial nerves: Extraocular movements intact. No facial asymmetry. Motor: moves all extremities symmetrically, at least anti-gravity x 4.    Assessment and Plan:   This is a pleasant 56 yo RH woman with a history  of migraines and pseudotumor cerebri, who had an episode of loss of consciousness with body stiffening last 06/24/2021. MRI brain and EEG normal. Symptoms suggestive of syncope, less likely seizure. Headaches have been under control despite reduction in Diamox to '500mg'$  qhs (reduced due to hypokalemia). Most recent K was 3.3. Continue current regimen of Diamox '500mg'$  qhs, Topiramate '100mg'$  BID, and nortriptyline '100mg'$  qhs. Continue to monitor K, she has bloodwork with her oncologist next week. Follow-up in 6 months, call for any changes.    Follow Up Instructions:   -I discussed the assessment and treatment plan with the patient. The patient was provided an opportunity to ask questions and all were answered. The patient agreed with the plan and demonstrated an understanding of the instructions.   The patient was advised to call back or seek an in-person evaluation if the symptoms worsen or if the condition fails to improve as anticipated.      Cameron Sprang, MD

## 2021-10-09 NOTE — Patient Instructions (Signed)
Good to see you. I wish you all the best!  Continue Diamox '500mg'$  every night  2. Continue Topiramate '100mg'$  twice a day  3. Continue Nortriptyline '50mg'$ : Take 2 capsules every night  4. Follow-up in 6 months, call for any changes

## 2021-10-11 DIAGNOSIS — C20 Malignant neoplasm of rectum: Secondary | ICD-10-CM | POA: Diagnosis not present

## 2021-10-15 DIAGNOSIS — C20 Malignant neoplasm of rectum: Secondary | ICD-10-CM | POA: Diagnosis not present

## 2021-10-16 ENCOUNTER — Inpatient Hospital Stay: Payer: BC Managed Care – PPO

## 2021-10-16 ENCOUNTER — Inpatient Hospital Stay: Payer: BC Managed Care – PPO | Admitting: Hematology & Oncology

## 2021-10-16 ENCOUNTER — Other Ambulatory Visit (HOSPITAL_COMMUNITY): Payer: Self-pay

## 2021-10-16 ENCOUNTER — Encounter: Payer: Self-pay | Admitting: Hematology & Oncology

## 2021-10-16 ENCOUNTER — Encounter: Payer: Self-pay | Admitting: *Deleted

## 2021-10-16 ENCOUNTER — Other Ambulatory Visit: Payer: Self-pay

## 2021-10-16 ENCOUNTER — Inpatient Hospital Stay: Payer: BC Managed Care – PPO | Attending: Hematology & Oncology

## 2021-10-16 DIAGNOSIS — Z7901 Long term (current) use of anticoagulants: Secondary | ICD-10-CM | POA: Diagnosis not present

## 2021-10-16 DIAGNOSIS — E876 Hypokalemia: Secondary | ICD-10-CM

## 2021-10-16 DIAGNOSIS — Z95828 Presence of other vascular implants and grafts: Secondary | ICD-10-CM

## 2021-10-16 DIAGNOSIS — Z79899 Other long term (current) drug therapy: Secondary | ICD-10-CM | POA: Diagnosis not present

## 2021-10-16 DIAGNOSIS — C20 Malignant neoplasm of rectum: Secondary | ICD-10-CM | POA: Diagnosis not present

## 2021-10-16 DIAGNOSIS — Z86718 Personal history of other venous thrombosis and embolism: Secondary | ICD-10-CM | POA: Diagnosis not present

## 2021-10-16 LAB — CBC WITH DIFFERENTIAL (CANCER CENTER ONLY)
Abs Immature Granulocytes: 0.03 10*3/uL (ref 0.00–0.07)
Basophils Absolute: 0 10*3/uL (ref 0.0–0.1)
Basophils Relative: 0 %
Eosinophils Absolute: 0.3 10*3/uL (ref 0.0–0.5)
Eosinophils Relative: 3 %
HCT: 36.7 % (ref 36.0–46.0)
Hemoglobin: 11.8 g/dL — ABNORMAL LOW (ref 12.0–15.0)
Immature Granulocytes: 0 %
Lymphocytes Relative: 22 %
Lymphs Abs: 2.2 10*3/uL (ref 0.7–4.0)
MCH: 29 pg (ref 26.0–34.0)
MCHC: 32.2 g/dL (ref 30.0–36.0)
MCV: 90.2 fL (ref 80.0–100.0)
Monocytes Absolute: 0.7 10*3/uL (ref 0.1–1.0)
Monocytes Relative: 7 %
Neutro Abs: 6.5 10*3/uL (ref 1.7–7.7)
Neutrophils Relative %: 68 %
Platelet Count: 319 10*3/uL (ref 150–400)
RBC: 4.07 MIL/uL (ref 3.87–5.11)
RDW: 13.7 % (ref 11.5–15.5)
WBC Count: 9.9 10*3/uL (ref 4.0–10.5)
nRBC: 0 % (ref 0.0–0.2)

## 2021-10-16 LAB — CMP (CANCER CENTER ONLY)
ALT: 15 U/L (ref 0–44)
AST: 13 U/L — ABNORMAL LOW (ref 15–41)
Albumin: 4.3 g/dL (ref 3.5–5.0)
Alkaline Phosphatase: 90 U/L (ref 38–126)
Anion gap: 11 (ref 5–15)
BUN: 20 mg/dL (ref 6–20)
CO2: 21 mmol/L — ABNORMAL LOW (ref 22–32)
Calcium: 9.3 mg/dL (ref 8.9–10.3)
Chloride: 102 mmol/L (ref 98–111)
Creatinine: 0.9 mg/dL (ref 0.44–1.00)
GFR, Estimated: >60 mL/min (ref >60)
Glucose, Bld: 137 mg/dL — ABNORMAL HIGH (ref 70–99)
Potassium: 3.4 mmol/L — ABNORMAL LOW (ref 3.5–5.1)
Sodium: 134 mmol/L — ABNORMAL LOW (ref 135–145)
Total Bilirubin: 0.3 mg/dL (ref 0.3–1.2)
Total Protein: 8.1 g/dL (ref 6.5–8.1)

## 2021-10-16 LAB — CEA (IN HOUSE-CHCC): CEA (CHCC-In House): 2.35 ng/mL (ref 0.00–5.00)

## 2021-10-16 MED ORDER — POTASSIUM CHLORIDE CRYS ER 20 MEQ PO TBCR
20.0000 meq | EXTENDED_RELEASE_TABLET | Freq: Two times a day (BID) | ORAL | 6 refills | Status: DC
  Filled 2021-10-16: qty 60, 30d supply, fill #0
  Filled 2021-11-06 – 2021-11-18 (×2): qty 60, 30d supply, fill #1
  Filled 2021-12-26: qty 60, 30d supply, fill #2
  Filled 2022-01-27: qty 60, 30d supply, fill #3
  Filled 2022-02-24: qty 60, 30d supply, fill #4
  Filled 2022-04-03: qty 60, 30d supply, fill #5

## 2021-10-16 MED ORDER — SODIUM CHLORIDE 0.9% FLUSH
10.0000 mL | INTRAVENOUS | Status: DC | PRN
  Administered 2021-10-16: 10 mL via INTRAVENOUS

## 2021-10-16 MED ORDER — HEPARIN SOD (PORK) LOCK FLUSH 100 UNIT/ML IV SOLN
500.0000 [IU] | Freq: Once | INTRAVENOUS | Status: AC
  Administered 2021-10-16: 500 [IU] via INTRAVENOUS

## 2021-10-16 NOTE — Patient Instructions (Signed)

## 2021-10-16 NOTE — Progress Notes (Signed)
Patient is here for her first office visit after her surgery. She is doing well and will start back to work on Monday.   With patient's complete response, patient will continue with observation. As such, will discontinue active navigation, however will be available to the patient as needed.   Oncology Nurse Navigator Documentation     10/16/2021    3:00 PM  Oncology Nurse Navigator Flowsheets  Navigation Complete Date: 10/16/2021  Post Navigation: Continue to Follow Patient? No  Reason Not Navigating Patient: No Treatment, Observation Only  Navigator Location [facility]  Navigator Encounter Type Treatment  Patient Visit Type MedOnc  Treatment Phase Active Tx  Barriers/Navigation Needs No Barriers At This Time  Interventions Psycho-Social Support  Acuity Level 1-No Barriers  Support Groups/Services Friends and Family  Time Spent with Patient 15

## 2021-10-16 NOTE — Progress Notes (Signed)
Hematology and Oncology Follow Up Visit  GENNAVIEVE HUQ [id number] 1965/08/09 56 y.o. 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 daily-started on 06/30/2021 Status post APR on 09/18/2021     Interim History:  Ms. Mchaney is back for follow-up.  The real good news is that she underwent surgery at [facility] on 09/18/2021.  She had a LAR procedure.  She has a temporary colostomy right now.  The pathology report showed that there is no residual cancer noted.  All margins were negative.  She had 13 negative lymph nodes.  As such, she had a complete response.  I do not see that there is a role for any adjuvant chemotherapy or radiation therapy.  She feels well.  She is going back to work on Monday.  She has had no problems with cough or shortness of breath.  She has had no nausea or vomiting.  I do not think she really even lost much weight with surgery.  She is on potassium.  We will decrease her potassium down to 20 mEq p.o. twice daily.  She has had no fever.  She has had no bleeding.  She has had no leg swelling.  She had a Doppler of her left leg done on 09/06/2021.  This show any evidence of residual thromboembolic disease.  She continues on Xarelto.  I will continue her on the Xarelto through her colostomy reversal.  She has a performance status right now of ECOG 0.    Medications:  Current Outpatient Medications:    acetaminophen (TYLENOL) 500 MG tablet, Take 500 mg by mouth every 6 (six) hours as needed., Disp: , Rfl:    acetaZOLAMIDE ER (DIAMOX) 500 MG capsule, Take 1 capsule (500 mg total) by mouth every night, Disp: 90 capsule, Rfl: 3   Ascorbic Acid (VITAMIN C) 1000 MG tablet, Take 1,000 mg by mouth daily., Disp: , Rfl:     atorvastatin (LIPITOR) 20 MG tablet, Take 1 tablet  by mouth daily., Disp: 90 tablet, Rfl: 3   Biotin 10000 MCG TABS, Take by mouth daily., Disp: , Rfl:    Calcium Carb-Cholecalciferol (CALCIUM 1000 + D PO), Take by mouth 2 (two) times daily., Disp: , Rfl:    cholecalciferol (VITAMIN D3) 25 MCG (1000 UT) tablet, Take 1,000 Units by mouth daily., Disp: , Rfl:    clotrimazole (MYCELEX) 10 MG troche, Take 1 tablet (10 mg total) by mouth 5 (five) times daily. Use for 7- 10 days.  Allow to dissolve slowly in mouth, Disp: 50 Troche, Rfl: 0   dicyclomine (BENTYL) 10 MG capsule, Take 1 capsule (10 mg total) by mouth 4 (four) times daily -  before meals and at bedtime., Disp: 90 capsule, Rfl: 1   fluticasone (FLONASE) 50 MCG/ACT nasal spray, Place 2 sprays into both nostrils daily. (Patient taking differently: Place 2 sprays into both nostrils daily. As needed), Disp: 16 g, Rfl: 6   HYDROcodone bit-homatropine (HYCODAN) 5-1.5 MG/5ML syrup, Take 5 mLs by mouth every 6 (six) hours as needed for cough. (Patient not taking: Reported on 10/09/2021), Disp: 120 mL, Rfl: 0   lisinopril-hydrochlorothiazide (ZESTORETIC) 20-25 MG tablet, Take 1 tablet by mouth daily., Disp: 90 tablet, Rfl: 3   nortriptyline (PAMELOR) 50 MG capsule, TAKE 2 CAPSULES BY  MOUTH EVERY NIGHT AS DIRECTED, Disp: 180 capsule, Rfl: 3   omeprazole (PRILOSEC) 40 MG capsule, Take 1 capsule (40 mg total) by mouth daily., Disp: 90 capsule, Rfl: 3   potassium chloride SA (KLOR-CON M) 20 MEQ tablet, Take 1 tablet (20 mEq total) by mouth 2 (two) times daily., Disp: 60 tablet, Rfl: 6   rivaroxaban (XARELTO) 20 MG TABS tablet, Take 1 tablet (20 mg total) by mouth daily with supper., Disp: 90 tablet, Rfl: 3   sertraline (ZOLOFT) 25 MG tablet, Take 1/2 tablet by mouth daily., Disp: 90 tablet, Rfl: 0   topiramate (TOPAMAX) 100 MG tablet, Take 1 tablet  by mouth 2  times daily., Disp: 180 tablet, Rfl: 3   vitamin E 1000 UNIT capsule, Take 1,000 Units by mouth  daily., Disp: , Rfl:   Allergies:  Allergies  Allergen Reactions   Neosporin  [Neomycin-Bacitracin Zn-Polymyx] Rash   Sulfa Antibiotics Other (See Comments)    Acts "goofy"    Past Medical History, Surgical history, Social history, and Family History were reviewed and updated.  Review of Systems: Review of Systems  Constitutional: Negative.   HENT:  Negative.    Eyes: Negative.   Respiratory: Negative.    Cardiovascular: Negative.   Gastrointestinal: Negative.   Endocrine: Negative.   Genitourinary: Negative.    Musculoskeletal: Negative.   Skin: Negative.   Neurological: Negative.   Hematological: Negative.   Psychiatric/Behavioral: Negative.      Physical Exam:  height is '5\' 7"'$  (1.702 m) and weight is 281 lb (127.5 kg). Her oral temperature is 97.6 F (36.4 C). Her blood pressure is 106/89 and her pulse is 101 (abnormal). Her respiration is 18 and oxygen saturation is 100%.   Wt Readings from Last 3 Encounters:  10/16/21 281 lb (127.5 kg)  10/01/21 294 lb (133.4 kg)  08/28/21 295 lb (133.8 kg)    Physical Exam Vitals reviewed.  HENT:     Head: Normocephalic and atraumatic.  Eyes:     Pupils: Pupils are equal, round, and reactive to light.  Cardiovascular:     Rate and Rhythm: Normal rate and regular rhythm.     Heart sounds: Normal heart sounds.  Pulmonary:     Effort: Pulmonary effort is normal.     Breath sounds: Normal breath sounds.  Abdominal:     General: Bowel sounds are normal.     Palpations: Abdomen is soft.  Musculoskeletal:        General: No tenderness or deformity. Normal range of motion.     Cervical back: Normal range of motion.     Comments: Lower extremity shows a little bit of swelling and pain in the left lower extremity.  She has a positive Homans' sign in the left leg.  Right leg is unremarkable.  She does have good pulses in her distal extremities.  Lymphadenopathy:     Cervical: No cervical adenopathy.  Skin:    General: Skin is  warm and dry.     Findings: No erythema or rash.  Neurological:     Mental Status: She is alert and oriented to person, place, and time.  Psychiatric:        Behavior: Behavior normal.        Thought Content: Thought content normal.        Judgment: Judgment normal.      Lab Results  Component Value Date   WBC 9.9 10/16/2021   HGB 11.8 (L) 10/16/2021   HCT 36.7 10/16/2021  MCV 90.2 10/16/2021   PLT 319 10/16/2021     Chemistry      Component Value Date/Time   NA 134 (L) 10/16/2021 1500   K 3.4 (L) 10/16/2021 1500   CL 102 10/16/2021 1500   CO2 21 (L) 10/16/2021 1500   BUN 20 10/16/2021 1500   CREATININE 0.90 10/16/2021 1500   CREATININE 0.84 11/15/2019 1516      Component Value Date/Time   CALCIUM 9.3 10/16/2021 1500   ALKPHOS 90 10/16/2021 1500   AST 13 (L) 10/16/2021 1500   ALT 15 10/16/2021 1500   BILITOT 0.3 10/16/2021 1500      Impression and Plan: Ms. Sulewski is a very charming 56 year old white female.  She has a localized rectal cancer.  By staging studies looks like this is a stage IIA.  We tried her on neoadjuvant chemotherapy.  She really had a miserable time with neoadjuvant therapy.  No matter what we try to, she just had a hard time.  However, she had a great response.  There is no identifiable malignancy with respect to the surgical specimen.  At this point, I think we just watch her.  I would think the risk of recurrence should be less than 10%.  I do not see any problems with her having the colostomy reversal.  It sounds like this will be done in December.  I would like to see her back in 2 months.  At that point, she may have had her colostomy reversal or be close to having it done.  I do not see that we have to do any scans on her.  I will adjust her potassium dose.  Volanda Napoleon, MD 10/4/20233:48 PM

## 2021-10-17 ENCOUNTER — Other Ambulatory Visit (HOSPITAL_COMMUNITY): Payer: Self-pay

## 2021-10-18 DIAGNOSIS — Z933 Colostomy status: Secondary | ICD-10-CM | POA: Diagnosis not present

## 2021-10-23 DIAGNOSIS — C20 Malignant neoplasm of rectum: Secondary | ICD-10-CM | POA: Diagnosis not present

## 2021-10-23 DIAGNOSIS — Z433 Encounter for attention to colostomy: Secondary | ICD-10-CM | POA: Diagnosis not present

## 2021-10-23 DIAGNOSIS — Z483 Aftercare following surgery for neoplasm: Secondary | ICD-10-CM | POA: Diagnosis not present

## 2021-10-23 DIAGNOSIS — I10 Essential (primary) hypertension: Secondary | ICD-10-CM | POA: Diagnosis not present

## 2021-10-23 DIAGNOSIS — E782 Mixed hyperlipidemia: Secondary | ICD-10-CM | POA: Diagnosis not present

## 2021-10-23 DIAGNOSIS — R7303 Prediabetes: Secondary | ICD-10-CM | POA: Diagnosis not present

## 2021-10-23 DIAGNOSIS — Z9181 History of falling: Secondary | ICD-10-CM | POA: Diagnosis not present

## 2021-10-23 DIAGNOSIS — R32 Unspecified urinary incontinence: Secondary | ICD-10-CM | POA: Diagnosis not present

## 2021-10-23 DIAGNOSIS — G4730 Sleep apnea, unspecified: Secondary | ICD-10-CM | POA: Diagnosis not present

## 2021-10-23 DIAGNOSIS — R7301 Impaired fasting glucose: Secondary | ICD-10-CM | POA: Diagnosis not present

## 2021-10-23 DIAGNOSIS — Z981 Arthrodesis status: Secondary | ICD-10-CM | POA: Diagnosis not present

## 2021-10-23 DIAGNOSIS — G43909 Migraine, unspecified, not intractable, without status migrainosus: Secondary | ICD-10-CM | POA: Diagnosis not present

## 2021-11-07 ENCOUNTER — Other Ambulatory Visit (HOSPITAL_COMMUNITY): Payer: Self-pay

## 2021-11-08 ENCOUNTER — Other Ambulatory Visit (HOSPITAL_COMMUNITY): Payer: Self-pay

## 2021-11-08 ENCOUNTER — Encounter: Payer: Self-pay | Admitting: Adult Health

## 2021-11-08 ENCOUNTER — Telehealth (INDEPENDENT_AMBULATORY_CARE_PROVIDER_SITE_OTHER): Payer: Self-pay | Admitting: Adult Health

## 2021-11-08 DIAGNOSIS — F489 Nonpsychotic mental disorder, unspecified: Secondary | ICD-10-CM

## 2021-11-08 NOTE — Progress Notes (Deleted)
Janice Lucas [id number] 11/07/1965 56 y.o.  Virtual Visit via Video Note  I connected with pt @ on 11/08/21 at 10:20 AM EDT by a video enabled telemedicine application and verified that I am speaking with the correct person using two identifiers.   I discussed the limitations of evaluation and management by telemedicine and the availability of in person appointments. The patient expressed understanding and agreed to proceed.  I discussed the assessment and treatment plan with the patient. The patient was provided an opportunity to ask questions and all were answered. The patient agreed with the plan and demonstrated an understanding of the instructions.   The patient was advised to call back or seek an in-person evaluation if the symptoms worsen or if the condition fails to improve as anticipated.  I provided 10 minutes of non-face-to-face time during this encounter.  The patient was located at home.  The provider was located at [place].   Aloha Gell, NP   Subjective:   Patient ID:  Janice Lucas is a 56 y.o. (DOB Dec 03, 1965) female.  Chief Complaint: No chief complaint on file.   HPI KIMARIE COOR presents for follow-up of grief reaction.   Describes mood today as "alright". Pleasant. Tearful at times. Mood symptoms - reports depression, anxiety, and irritability at times. Mood is consistent. Stating "I'm doing ok". Takes 1/2 tablet of Zoloft '25mg'$  daily. Currently in treatment for rectal cancer - prognosis good. Family doing well. Stable interest and motivation.Taking medication as prescribed. Energy levels vary. Active, does not have a regular exercise routine.  Enjoys some usual interests and [place] Mar[place]. Lives with wife of 15 years - 4 dogs. Spending time with family. Appetite adequate. Weight gain. Sleeps well most nights. Averages 8 hours. Using CPAP nightly. Focus and concentration stable. Completing tasks. Managing  aspects of household. Working full time 40 to 50 hours a week. Denies SI or HI.  Denies AH or VH.  Previous medication trials: Denies   Review of Systems:  Review of Systems  Musculoskeletal:  Negative for gait problem.  Neurological:  Negative for tremors.  Psychiatric/Behavioral:         Please refer to HPI    Medications: I have reviewed the patient's current medications.  Current Outpatient Medications  Medication Sig Dispense Refill   acetaminophen (TYLENOL) 500 MG tablet Take 500 mg by mouth every 6 (six) hours as needed.     acetaZOLAMIDE ER (DIAMOX) 500 MG capsule Take 1 capsule (500 mg total) by mouth every night 90 capsule 3   Ascorbic Acid (VITAMIN C) 1000 MG tablet Take 1,000 mg by mouth daily.     atorvastatin (LIPITOR) 20 MG tablet Take 1 tablet  by mouth daily. 90 tablet 3   Biotin 10000 MCG TABS Take by mouth daily.     Calcium Carb-Cholecalciferol (CALCIUM 1000 + D PO) Take by mouth 2 (two) times daily.     cholecalciferol (VITAMIN D3) 25 MCG (1000 UT) tablet Take 1,000 Units by mouth daily.     clotrimazole (MYCELEX) 10 MG troche Take 1 tablet (10 mg total) by mouth 5 (five) times daily. Use for 7- 10 days.  Allow to dissolve slowly in mouth 50 Troche 0   dicyclomine (BENTYL) 10 MG capsule Take 1 capsule (10 mg total) by mouth 4 (four) times daily -  before meals and at bedtime. 90 capsule 1   fluticasone (FLONASE) 50 MCG/ACT nasal spray Place 2 sprays into both nostrils daily. (  Patient taking differently: Place 2 sprays into both nostrils daily. As needed) 16 g 6   HYDROcodone bit-homatropine (HYCODAN) 5-1.5 MG/5ML syrup Take 5 mLs by mouth every 6 (six) hours as needed for cough. (Patient not taking: Reported on 10/09/2021) 120 mL 0   lisinopril-hydrochlorothiazide (ZESTORETIC) 20-25 MG tablet Take 1 tablet by mouth daily. 90 tablet 3   nortriptyline (PAMELOR) 50 MG capsule TAKE 2 CAPSULES BY MOUTH EVERY NIGHT AS DIRECTED 180 capsule 3   omeprazole (PRILOSEC) 40 MG  capsule Take 1 capsule (40 mg total) by mouth daily. 90 capsule 3   potassium chloride SA (KLOR-CON M) 20 MEQ tablet Take 1 tablet (20 mEq total) by mouth 2 (two) times daily. 60 tablet 6   rivaroxaban (XARELTO) 20 MG TABS tablet Take 1 tablet (20 mg total) by mouth daily with supper. 90 tablet 3   sertraline (ZOLOFT) 25 MG tablet Take 1/2 tablet by mouth daily. 90 tablet 0   topiramate (TOPAMAX) 100 MG tablet Take 1 tablet  by mouth 2  times daily. 180 tablet 3   vitamin E 1000 UNIT capsule Take 1,000 Units by mouth daily.     No current facility-administered medications for this visit.    Medication Side Effects: None  Allergies:  Allergies  Allergen Reactions   Neosporin  [Neomycin-Bacitracin Zn-Polymyx] Rash   Sulfa Antibiotics Other (See Comments)    Acts "goofy"    Past Medical History:  Diagnosis Date   Allergy    SEASONAL - MILD   GERD (gastroesophageal reflux disease)    ON OMEPRAZOLE   Goals of care, counseling/discussion 04/26/2021   Hyperlipidemia    ON MEDS   Hypertension    ON MEDS   Rectal cancer ([place]) 04/26/2021   Sleep apnea    WEARS CPAP    Family History  Problem Relation Age of Onset   Prostate cancer Father        METS TO LYMPH NODES AND BLADDER   Diabetes Maternal Aunt    Diabetes Maternal Uncle    Colon cancer Neg Hx    Colon polyps Neg Hx    Esophageal cancer Neg Hx    Rectal cancer Neg Hx    Stomach cancer Neg Hx     Social History   Socioeconomic History   Marital status: Married    Spouse name: Not on file   Number of children: Not on file   Years of education: Not on file   Highest education level: Not on file  Occupational History   Not on file  Tobacco Use   Smoking status: Former    Packs/day: 1.00    Years: 20.00    Total pack years: 20.00    Types: Cigarettes   Smokeless tobacco: Current   Tobacco comments:    VAPES   Vaping Use   Vaping Use: Every day   Substances: Nicotine, Flavoring  Substance and Sexual Activity    Alcohol use: No    Comment: occassionally   Drug use: Not Currently   Sexual activity: Not Currently  Other Topics Concern   Not on file  Social History Narrative   Right Handed   Lives in a one story home   Drinks a big cup of coffee daily   Social Determinants of Health   Financial Resource Strain: Not on file  Food Insecurity: Not on file  Transportation Needs: Not on file  Physical Activity: Not on file  Stress: Not on file  Social Connections: Not on file  Intimate Partner Violence: Not on file    Past Medical History, Surgical history, Social history, and Family history were reviewed and updated as appropriate.   Please see review of systems for further details on the patient's review from today.   Objective:   Physical Exam:  LMP 01/29/2011   Physical Exam Constitutional:      General: She is not in acute distress. Musculoskeletal:        General: No deformity.  Neurological:     Mental Status: She is alert and oriented to person, place, and time.     Coordination: Coordination normal.  Psychiatric:        Attention and Perception: Attention and perception normal. She does not perceive auditory or visual hallucinations.        Mood and Affect: Mood normal. Mood is not anxious or depressed. Affect is not labile, blunt, angry or inappropriate.        Speech: Speech normal.        Behavior: Behavior normal.        Thought Content: Thought content normal. Thought content is not paranoid or delusional. Thought content does not include homicidal or suicidal ideation. Thought content does not include homicidal or suicidal plan.        Cognition and Memory: Cognition and memory normal.        Judgment: Judgment normal.     Comments: Insight intact     Lab Review:     Component Value Date/Time   NA 134 (L) 10/16/2021 1500   K 3.4 (L) 10/16/2021 1500   CL 102 10/16/2021 1500   CO2 21 (L) 10/16/2021 1500   GLUCOSE 137 (H) 10/16/2021 1500   BUN 20 10/16/2021  1500   CREATININE 0.90 10/16/2021 1500   CREATININE 0.84 11/15/2019 1516   CALCIUM 9.3 10/16/2021 1500   PROT 8.1 10/16/2021 1500   ALBUMIN 4.3 10/16/2021 1500   AST 13 (L) 10/16/2021 1500   ALT 15 10/16/2021 1500   ALKPHOS 90 10/16/2021 1500   BILITOT 0.3 10/16/2021 1500   GFRNONAA >60 10/16/2021 1500   GFRAA >90 02/14/2011 1019       Component Value Date/Time   WBC 9.9 10/16/2021 1500   WBC 8.6 06/29/2021 2208   RBC 4.07 10/16/2021 1500   HGB 11.8 (L) 10/16/2021 1500   HCT 36.7 10/16/2021 1500   PLT 319 10/16/2021 1500   MCV 90.2 10/16/2021 1500   MCH 29.0 10/16/2021 1500   MCHC 32.2 10/16/2021 1500   RDW 13.7 10/16/2021 1500   LYMPHSABS 2.2 10/16/2021 1500   MONOABS 0.7 10/16/2021 1500   EOSABS 0.3 10/16/2021 1500   BASOSABS 0.0 10/16/2021 1500    No results found for: "POCLITH", "LITHIUM"   No results found for: "PHENYTOIN", "PHENOBARB", "VALPROATE", "CBMZ"   .res Assessment: Plan:   Plan:  PDMP reviewed  Zoloft '25mg'$  - Taking 1/2 one tablet daily.  Patient advised to contact office with any questions, adverse effects, or acute worsening in signs and symptoms.   Time spent with patient was 10 minutes. Greater than 50% of face to face time with patient was spent on counseling and coordination of care.   RTC 3 months  There are no diagnoses linked to this encounter.   Please see After Visit Summary for patient specific instructions.  Future Appointments  Date Time Provider [place]  01/01/2022  1:15 PM [facility] LAB [facility] None  01/01/2022  1:30 PM [facility] INJ NURSE [facility] None  01/01/2022  1:45 PM [place],  Rudell Cobb, MD [facility] None    No orders of the defined types were placed in this encounter.     -------------------------------

## 2021-11-08 NOTE — Progress Notes (Signed)
Patient no show appointment. Did not connect to mychart. Called and LVM.

## 2021-11-11 ENCOUNTER — Other Ambulatory Visit (HOSPITAL_COMMUNITY): Payer: Self-pay

## 2021-11-12 ENCOUNTER — Telehealth (INDEPENDENT_AMBULATORY_CARE_PROVIDER_SITE_OTHER): Payer: BC Managed Care – PPO | Admitting: Adult Health

## 2021-11-12 ENCOUNTER — Encounter: Payer: Self-pay | Admitting: Adult Health

## 2021-11-12 DIAGNOSIS — F4321 Adjustment disorder with depressed mood: Secondary | ICD-10-CM

## 2021-11-12 NOTE — Progress Notes (Signed)
Janice Lucas [id number] [birth year] 56 y.o.  Virtual Visit via Video Note  I connected with pt @ on 11/12/21 at  8:40 AM EDT by a video enabled telemedicine application and verified that I am speaking with the correct person using two identifiers.   I discussed the limitations of evaluation and management by telemedicine and the availability of in person appointments. The patient expressed understanding and agreed to proceed.  I discussed the assessment and treatment plan with the patient. The patient was provided an opportunity to ask questions and all were answered. The patient agreed with the plan and demonstrated an understanding of the instructions.   The patient was advised to call back or seek an in-person evaluation if the symptoms worsen or if the condition fails to improve as anticipated.  I provided 10 minutes of non-face-to-face time during this encounter.  The patient was located at home.  The provider was located at [place].   Aloha Gell, NP   Subjective:   Patient ID:  Janice Lucas is a 56 y.o. (DOB [birth year]) female.  Chief Complaint: No chief complaint on file.   HPI  Janice Lucas presents for follow-up of grief reaction.   Describes mood today as "ok". Pleasant. Denies tearfulness. Mood symptoms - denies depression, anxiety, and irritability at times. Mood is consistent. Stating "I'm doing pretty good". Takes 1/2 tablet of Zoloft '25mg'$  daily. Has recovered from rectal cancer - colonoscopy bag reversal upcoming in December. Family doing well. Stable interest and motivation.Taking medication as prescribed. Energy levels good. Active, does not have a regular exercise routine.  Enjoys some usual interests and activities. Married. Lives with wife of 15 years - 4 dogs[place] Spending time with family. Appetite adequate. Weight loss. Sleeps well most nights. Averages 8 hours. Using CPAP nightly. Focus and concentration stable.  Completing tasks. Managing aspects of household. Working full time 40  hours a week. Denies SI or HI.  Denies AH or VH.  Previous medication trials: Denies     Review of Systems:  Review of Systems  Musculoskeletal:  Negative for gait problem.  Neurological:  Negative for tremors.  Psychiatric/Behavioral:         Please refer to HPI    Medications: I have reviewed the patient's current medications.  Current Outpatient Medications  Medication Sig Dispense Refill   acetaminophen (TYLENOL) 500 MG tablet Take 500 mg by mouth every 6 (six) hours as needed.     acetaZOLAMIDE ER (DIAMOX) 500 MG capsule Take 1 capsule (500 mg total) by mouth every night 90 capsule 3   Ascorbic Acid (VITAMIN C) 1000 MG tablet Take 1,000 mg by mouth daily.     atorvastatin (LIPITOR) 20 MG tablet Take 1 tablet  by mouth daily. 90 tablet 3   Biotin 10000 MCG TABS Take by mouth daily.     Calcium Carb-Cholecalciferol (CALCIUM 1000 + D PO) Take by mouth 2 (two) times daily.     cholecalciferol (VITAMIN D3) 25 MCG (1000 UT) tablet Take 1,000 Units by mouth daily.     clotrimazole (MYCELEX) 10 MG troche Take 1 tablet (10 mg total) by mouth 5 (five) times daily. Use for 7- 10 days.  Allow to dissolve slowly in mouth 50 Troche 0   dicyclomine (BENTYL) 10 MG capsule Take 1 capsule (10 mg total) by mouth 4 (four) times daily -  before meals and at bedtime. 90 capsule 1   fluticasone (FLONASE) 50 MCG/ACT nasal spray Place  2 sprays into both nostrils daily. (Patient taking differently: Place 2 sprays into both nostrils daily. As needed) 16 g 6   HYDROcodone bit-homatropine (HYCODAN) 5-1.5 MG/5ML syrup Take 5 mLs by mouth every 6 (six) hours as needed for cough. (Patient not taking: Reported on 10/09/2021) 120 mL 0   lisinopril-hydrochlorothiazide (ZESTORETIC) 20-25 MG tablet Take 1 tablet by mouth daily. 90 tablet 3   nortriptyline (PAMELOR) 50 MG capsule TAKE 2 CAPSULES BY MOUTH EVERY NIGHT AS DIRECTED 180 capsule 3    omeprazole (PRILOSEC) 40 MG capsule Take 1 capsule (40 mg total) by mouth daily. 90 capsule 3   potassium chloride SA (KLOR-CON M) 20 MEQ tablet Take 1 tablet (20 mEq total) by mouth 2 (two) times daily. 60 tablet 6   rivaroxaban (XARELTO) 20 MG TABS tablet Take 1 tablet (20 mg total) by mouth daily with supper. 90 tablet 3   sertraline (ZOLOFT) 25 MG tablet Take 1/2 tablet by mouth daily. 90 tablet 0   topiramate (TOPAMAX) 100 MG tablet Take 1 tablet  by mouth 2  times daily. 180 tablet 3   vitamin E 1000 UNIT capsule Take 1,000 Units by mouth daily.     No current facility-administered medications for this visit.    Medication Side Effects: None  Allergies:  Allergies  Allergen Reactions   Neosporin  [Neomycin-Bacitracin Zn-Polymyx] Rash   Sulfa Antibiotics Other (See Comments)    Acts "goofy"    Past Medical History:  Diagnosis Date   Allergy    SEASONAL - MILD   GERD (gastroesophageal reflux disease)    ON OMEPRAZOLE   Goals of care, counseling/discussion 04/26/2021   Hyperlipidemia    ON MEDS   Hypertension    ON MEDS   Rectal cancer ([place]) 04/26/2021   Sleep apnea    WEARS CPAP    Family History  Problem Relation Age of Onset   Prostate cancer Father        METS TO LYMPH NODES AND BLADDER   Diabetes Maternal Aunt    Diabetes Maternal Uncle    Colon cancer Neg Hx    Colon polyps Neg Hx    Esophageal cancer Neg Hx    Rectal cancer Neg Hx    Stomach cancer Neg Hx     Social History   Socioeconomic History   Marital status: Married    Spouse name: Not on file   Number of children: Not on file   Years of education: Not on file   Highest education level: Not on file  Occupational History   Not on file  Tobacco Use   Smoking status: Former    Packs/day: 1.00    Years: 20.00    Total pack years: 20.00    Types: Cigarettes   Smokeless tobacco: Current   Tobacco comments:    VAPES   Vaping Use   Vaping Use: Every day   Substances: Nicotine, Flavoring   Substance and Sexual Activity   Alcohol use: No    Comment: occassionally   Drug use: Not Currently   Sexual activity: Not Currently  Other Topics Concern   Not on file  Social History Narrative   Right Handed   Lives in a one story home   Drinks a big cup of coffee daily   Social Determinants of Health   Financial Resource Strain: Not on file  Food Insecurity: Not on file  Transportation Needs: Not on file  Physical Activity: Not on file  Stress: Not on file  Social Connections: Not on file  Intimate Partner Violence: Not on file    Past Medical History, Surgical history, Social history, and Family history were reviewed and updated as appropriate.   Please see review of systems for further details on the patient's review from today.   Objective:   Physical Exam:  LMP 01/29/2011   Physical Exam Constitutional:      General: She is not in acute distress. Musculoskeletal:        General: No deformity.  Neurological:     Mental Status: She is alert and oriented to person, place, and time.     Coordination: Coordination normal.  Psychiatric:        Attention and Perception: Attention and perception normal. She does not perceive auditory or visual hallucinations.        Mood and Affect: Mood normal. Mood is not anxious or depressed. Affect is not labile, blunt, angry or inappropriate.        Speech: Speech normal.        Behavior: Behavior normal.        Thought Content: Thought content normal. Thought content is not paranoid or delusional. Thought content does not include homicidal or suicidal ideation. Thought content does not include homicidal or suicidal plan.        Cognition and Memory: Cognition and memory normal.        Judgment: Judgment normal.     Comments: Insight intact     Lab Review:     Component Value Date/Time   NA 134 (L) 10/16/2021 1500   K 3.4 (L) 10/16/2021 1500   CL 102 10/16/2021 1500   CO2 21 (L) 10/16/2021 1500   GLUCOSE 137 (H)  10/16/2021 1500   BUN 20 10/16/2021 1500   CREATININE 0.90 10/16/2021 1500   CREATININE 0.84 11/15/2019 1516   CALCIUM 9.3 10/16/2021 1500   PROT 8.1 10/16/2021 1500   ALBUMIN 4.3 10/16/2021 1500   AST 13 (L) 10/16/2021 1500   ALT 15 10/16/2021 1500   ALKPHOS 90 10/16/2021 1500   BILITOT 0.3 10/16/2021 1500   GFRNONAA >60 10/16/2021 1500   GFRAA >90 02/14/2011 1019       Component Value Date/Time   WBC 9.9 10/16/2021 1500   WBC 8.6 06/29/2021 2208   RBC 4.07 10/16/2021 1500   HGB 11.8 (L) 10/16/2021 1500   HCT 36.7 10/16/2021 1500   PLT 319 10/16/2021 1500   MCV 90.2 10/16/2021 1500   MCH 29.0 10/16/2021 1500   MCHC 32.2 10/16/2021 1500   RDW 13.7 10/16/2021 1500   LYMPHSABS 2.2 10/16/2021 1500   MONOABS 0.7 10/16/2021 1500   EOSABS 0.3 10/16/2021 1500   BASOSABS 0.0 10/16/2021 1500    No results found for: "POCLITH", "LITHIUM"   No results found for: "PHENYTOIN", "PHENOBARB", "VALPROATE", "CBMZ"   .res Assessment: Plan:    Plan:  PDMP reviewed  Zoloft '25mg'$  - Taking 1/2 one tablet daily.  Patient advised to contact office with any questions, adverse effects, or acute worsening in signs and symptoms.   Time spent with patient was 10 minutes. Greater than 50% of face to face time with patient was spent on counseling and coordination of care.   RTC 6 months  Diagnoses and all orders for this visit:  Grief reaction     Please see After Visit Summary for patient specific instructions.  Future Appointments  Date Time Provider [place]  01/01/2022  1:15 PM [facility] LAB [facility] None  01/01/2022  1:30 PM  [facility] INJ NURSE [facility] None  01/01/2022  1:45 PM [place], Rudell Cobb, MD [facility] None    No orders of the defined types were placed in this encounter.     -------------------------------

## 2021-11-13 DIAGNOSIS — Z932 Ileostomy status: Secondary | ICD-10-CM | POA: Diagnosis not present

## 2021-11-18 ENCOUNTER — Other Ambulatory Visit (HOSPITAL_COMMUNITY): Payer: Self-pay

## 2021-11-20 ENCOUNTER — Other Ambulatory Visit: Payer: Self-pay | Admitting: Neurology

## 2021-11-21 DIAGNOSIS — Z933 Colostomy status: Secondary | ICD-10-CM | POA: Diagnosis not present

## 2021-11-25 ENCOUNTER — Other Ambulatory Visit (HOSPITAL_COMMUNITY): Payer: Self-pay

## 2021-12-12 DIAGNOSIS — C20 Malignant neoplasm of rectum: Secondary | ICD-10-CM | POA: Diagnosis not present

## 2021-12-13 DIAGNOSIS — I10 Essential (primary) hypertension: Secondary | ICD-10-CM | POA: Diagnosis not present

## 2021-12-13 DIAGNOSIS — F32A Depression, unspecified: Secondary | ICD-10-CM | POA: Diagnosis not present

## 2021-12-13 DIAGNOSIS — Z432 Encounter for attention to ileostomy: Secondary | ICD-10-CM | POA: Diagnosis not present

## 2021-12-13 DIAGNOSIS — K219 Gastro-esophageal reflux disease without esophagitis: Secondary | ICD-10-CM | POA: Diagnosis not present

## 2021-12-13 DIAGNOSIS — F419 Anxiety disorder, unspecified: Secondary | ICD-10-CM | POA: Diagnosis not present

## 2021-12-13 DIAGNOSIS — E118 Type 2 diabetes mellitus with unspecified complications: Secondary | ICD-10-CM | POA: Diagnosis not present

## 2021-12-13 DIAGNOSIS — Z7901 Long term (current) use of anticoagulants: Secondary | ICD-10-CM | POA: Diagnosis not present

## 2021-12-13 DIAGNOSIS — G4730 Sleep apnea, unspecified: Secondary | ICD-10-CM | POA: Diagnosis not present

## 2021-12-13 DIAGNOSIS — Z932 Ileostomy status: Secondary | ICD-10-CM | POA: Diagnosis not present

## 2021-12-13 DIAGNOSIS — Z87891 Personal history of nicotine dependence: Secondary | ICD-10-CM | POA: Diagnosis not present

## 2021-12-13 DIAGNOSIS — Z85048 Personal history of other malignant neoplasm of rectum, rectosigmoid junction, and anus: Secondary | ICD-10-CM | POA: Diagnosis not present

## 2021-12-13 HISTORY — PX: COLON SURGERY: SHX602

## 2021-12-14 ENCOUNTER — Other Ambulatory Visit (HOSPITAL_COMMUNITY): Payer: Self-pay

## 2021-12-16 DIAGNOSIS — Z432 Encounter for attention to ileostomy: Secondary | ICD-10-CM | POA: Diagnosis not present

## 2021-12-17 ENCOUNTER — Encounter (INDEPENDENT_AMBULATORY_CARE_PROVIDER_SITE_OTHER): Payer: BC Managed Care – PPO | Admitting: Family Medicine

## 2021-12-17 DIAGNOSIS — E118 Type 2 diabetes mellitus with unspecified complications: Secondary | ICD-10-CM

## 2021-12-19 ENCOUNTER — Telehealth: Payer: Self-pay | Admitting: *Deleted

## 2021-12-19 ENCOUNTER — Encounter: Payer: Self-pay | Admitting: *Deleted

## 2021-12-19 NOTE — Patient Outreach (Signed)
  Care Coordination TOC Note Transition Care Management Follow-up Telephone Call Date of discharge and from where: Wednesday, 12/18/21 [facility]; surgical closure of loop ileostomy; rectal CA How have you been since you were released from the hospital? "I am doing fine; my mother and my niece are looking after me, I came to stay at my mama's house until I recuperate from this.  All is going well- I have the injections for the blood thinner and know to re-start the Xarelto when the injections are stopped on Saturday.  I am not in bad pain at all and am pretty much able to do everything I need to own my own without any help" Any questions or concerns? No  Items Reviewed: Did the pt receive and understand the discharge instructions provided? Yes  Medications obtained and verified? Yes  declines full medication review; verifies obtained and is taking newly prescribed medications post-hospital discharge; self manages medications Other? No  Any new allergies since your discharge? No  Dietary orders reviewed? Yes Do you have support at home? Yes  mother assisting with care needs as indicated; patient reports independent in self-care  Home Care and Equipment/Supplies: Were home health services ordered? no If so, what is the name of the agency? N/A  Has the agency set up a time to come to the patient's home? not applicable Were any new equipment or medical supplies ordered?  No What is the name of the medical supply agency? N/A Were you able to get the supplies/equipment? not applicable Do you have any questions related to the use of the equipment or supplies? No N/A  Functional Questionnaire: (I = Independent and D = Dependent) ADLs: I  mother assisting with care needs as indicated  Bathing/Dressing- I  Meal Prep- I  mother assisting with care needs as indicated  Eating- I  Maintaining continence- I  Transferring/Ambulation- I  Managing Meds- I  Follow up appointments  reviewed:  PCP Hospital f/u appt confirmed? No  Scheduled to see - on - @ [facility] f/u appt confirmed? Yes  Scheduled to see Surgeon at [facility] on Tuesday 12/31/21 @ "I can't remember, I am not near my calendar;" oncology provider, Dr. Marin Olp on Wednesday 01/01/22 at 1:30 pm Are transportation arrangements needed? No  If their condition worsens, is the pt aware to call PCP or go to the Emergency Dept.? Yes Was the patient provided with contact information for the PCP's office or ED? No- declines-- reports already has contact information for care providers Was to pt encouraged to call back with questions or concerns? Yes  SDOH assessments and interventions completed:   Yes SDOH Interventions Today    Flowsheet Row Most Recent Value  SDOH Interventions   Food Insecurity Interventions Intervention Not Indicated  Transportation Interventions Intervention Not Indicated  [normally drives self,  mother or niece providing transportation post-recent surgery]      Care Coordination Interventions:  Discussed and provided education around side effects of narcotic medication along with corresponding action plan; reviewed driving restrictions and general post-op instructions with patient    Encounter Outcome:  Pt. Visit Completed    Oneta Rack, RN, BSN, CCRN Alumnus RN CM Care Coordination/ Transition of [place] Management [phone]: direct office

## 2021-12-23 ENCOUNTER — Encounter: Payer: Self-pay | Admitting: Family Medicine

## 2021-12-23 NOTE — Telephone Encounter (Signed)
Pt called to talk with pcp ab sugar levels. Stated she is seeing [place] on Wednesday so was not sure if an appt with [place] is necessary. Please advise.

## 2021-12-24 NOTE — Telephone Encounter (Signed)
Patient recently released from the hospital, A1c previously 6 to 6.5%.  Mild elevation of glucose while hospitalized.  Sent message to patient, will order A1c  Please see the MyChart message reply(ies) for my assessment and plan.  The patient gave consent for this Medical Advice Message and is aware that it may result in a bill to their insurance company as well as the possibility that this may result in a co-payment or deductible. They are an established patient, but are not seeking medical advice exclusively about a problem treated during an in person or video visit in the last 7 days. I did not recommend an in person or video visit within 7 days of my reply.  I spent a total of 10 minutes cumulative time within 7 days through Savannah Lamar Blinks, MD

## 2021-12-26 ENCOUNTER — Other Ambulatory Visit (HOSPITAL_COMMUNITY): Payer: Self-pay

## 2022-01-01 ENCOUNTER — Inpatient Hospital Stay: Payer: BC Managed Care – PPO | Attending: Hematology & Oncology

## 2022-01-01 ENCOUNTER — Inpatient Hospital Stay: Payer: BC Managed Care – PPO

## 2022-01-01 ENCOUNTER — Inpatient Hospital Stay (HOSPITAL_BASED_OUTPATIENT_CLINIC_OR_DEPARTMENT_OTHER): Payer: BC Managed Care – PPO | Admitting: Hematology & Oncology

## 2022-01-01 ENCOUNTER — Other Ambulatory Visit: Payer: Self-pay | Admitting: *Deleted

## 2022-01-01 ENCOUNTER — Other Ambulatory Visit: Payer: Self-pay | Admitting: Family Medicine

## 2022-01-01 ENCOUNTER — Encounter: Payer: Self-pay | Admitting: Hematology & Oncology

## 2022-01-01 VITALS — BP 114/63 | HR 88 | Temp 97.9°F | Resp 20 | Ht 67.0 in | Wt 287.0 lb

## 2022-01-01 DIAGNOSIS — I743 Embolism and thrombosis of arteries of the lower extremities: Secondary | ICD-10-CM

## 2022-01-01 DIAGNOSIS — Z86718 Personal history of other venous thrombosis and embolism: Secondary | ICD-10-CM | POA: Diagnosis not present

## 2022-01-01 DIAGNOSIS — E876 Hypokalemia: Secondary | ICD-10-CM

## 2022-01-01 DIAGNOSIS — E118 Type 2 diabetes mellitus with unspecified complications: Secondary | ICD-10-CM | POA: Diagnosis not present

## 2022-01-01 DIAGNOSIS — Z95828 Presence of other vascular implants and grafts: Secondary | ICD-10-CM

## 2022-01-01 DIAGNOSIS — C20 Malignant neoplasm of rectum: Secondary | ICD-10-CM

## 2022-01-01 DIAGNOSIS — Z79899 Other long term (current) drug therapy: Secondary | ICD-10-CM | POA: Diagnosis not present

## 2022-01-01 DIAGNOSIS — Z7901 Long term (current) use of anticoagulants: Secondary | ICD-10-CM | POA: Diagnosis not present

## 2022-01-01 DIAGNOSIS — Z9221 Personal history of antineoplastic chemotherapy: Secondary | ICD-10-CM | POA: Diagnosis not present

## 2022-01-01 LAB — CBC WITH DIFFERENTIAL (CANCER CENTER ONLY)
Abs Immature Granulocytes: 0.02 10*3/uL (ref 0.00–0.07)
Basophils Absolute: 0 10*3/uL (ref 0.0–0.1)
Basophils Relative: 0 %
Eosinophils Absolute: 0.2 10*3/uL (ref 0.0–0.5)
Eosinophils Relative: 3 %
HCT: 35.8 % — ABNORMAL LOW (ref 36.0–46.0)
Hemoglobin: 11.5 g/dL — ABNORMAL LOW (ref 12.0–15.0)
Immature Granulocytes: 0 %
Lymphocytes Relative: 26 %
Lymphs Abs: 1.7 10*3/uL (ref 0.7–4.0)
MCH: 27.8 pg (ref 26.0–34.0)
MCHC: 32.1 g/dL (ref 30.0–36.0)
MCV: 86.5 fL (ref 80.0–100.0)
Monocytes Absolute: 0.5 10*3/uL (ref 0.1–1.0)
Monocytes Relative: 7 %
Neutro Abs: 4.3 10*3/uL (ref 1.7–7.7)
Neutrophils Relative %: 64 %
Platelet Count: 358 10*3/uL (ref 150–400)
RBC: 4.14 MIL/uL (ref 3.87–5.11)
RDW: 14.4 % (ref 11.5–15.5)
WBC Count: 6.7 10*3/uL (ref 4.0–10.5)
nRBC: 0 % (ref 0.0–0.2)

## 2022-01-01 LAB — CMP (CANCER CENTER ONLY)
ALT: 14 U/L (ref 0–44)
AST: 12 U/L — ABNORMAL LOW (ref 15–41)
Albumin: 4.2 g/dL (ref 3.5–5.0)
Alkaline Phosphatase: 73 U/L (ref 38–126)
Anion gap: 11 (ref 5–15)
BUN: 15 mg/dL (ref 6–20)
CO2: 23 mmol/L (ref 22–32)
Calcium: 9 mg/dL (ref 8.9–10.3)
Chloride: 104 mmol/L (ref 98–111)
Creatinine: 0.76 mg/dL (ref 0.44–1.00)
GFR, Estimated: >60 mL/min (ref >60)
Glucose, Bld: 116 mg/dL — ABNORMAL HIGH (ref 70–99)
Potassium: 3.2 mmol/L — ABNORMAL LOW (ref 3.5–5.1)
Sodium: 138 mmol/L (ref 135–145)
Total Bilirubin: 0.3 mg/dL (ref 0.3–1.2)
Total Protein: 7.4 g/dL (ref 6.5–8.1)

## 2022-01-01 LAB — CEA (ACCESS): CEA (CHCC): 2.99 ng/mL (ref 0.00–5.00)

## 2022-01-01 NOTE — Patient Instructions (Signed)

## 2022-01-01 NOTE — Progress Notes (Signed)
Hematology and Oncology Follow Up Visit  Janice Lucas [id number] 23-Sep-1965 56 y.o. 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 daily-started on 06/30/2021 Status post APR on 09/18/2021     Interim History:  [name] is back for follow-up.  She really looks fantastic.  She did have the colostomy reversed.  This was done a couple weeks ago.  She tolerated this quite well.  She feels good.  She has had no complaints.  She has had no cough or shortness of breath.  She has had no nausea or vomiting.  She is going to the bathroom without difficulty.  She is working..  She did have a nice Thanksgiving with her family.  She has had no leg swelling.  She is on Xarelto.  I think we will probably stop the Xarelto when we see her back in March.  She had a Doppler of her left leg back in August of this year which did not show any residual thrombus.  She has had no bleeding.  There is been no problems with headache.  Overall, I would say performance status is probably ECOG 0.    Medications:  Current Outpatient Medications:    acetaminophen (TYLENOL) 500 MG tablet, Take 500 mg by mouth every 6 (six) hours as needed., Disp: , Rfl:    acetaZOLAMIDE ER (DIAMOX) 500 MG capsule, Take 1 capsule (500 mg total) by mouth every night, Disp: 90 capsule, Rfl: 3   Ascorbic Acid (VITAMIN C) 1000 MG tablet, Take 1,000 mg by mouth daily., Disp: , Rfl:    atorvastatin (LIPITOR) 20 MG tablet, Take 1 tablet  by mouth daily., Disp: 90 tablet, Rfl: 3   Biotin 10000 MCG TABS, Take by mouth daily., Disp: , Rfl:    Calcium Carb-Cholecalciferol (CALCIUM 1000 + D PO), Take by mouth 2 (two) times daily., Disp: , Rfl:    cholecalciferol (VITAMIN D3) 25 MCG  (1000 UT) tablet, Take 1,000 Units by mouth daily., Disp: , Rfl:    dicyclomine (BENTYL) 10 MG capsule, Take 1 capsule (10 mg total) by mouth 4 (four) times daily -  before meals and at bedtime., Disp: 90 capsule, Rfl: 1   lisinopril-hydrochlorothiazide (ZESTORETIC) 20-25 MG tablet, Take 1 tablet by mouth daily., Disp: 90 tablet, Rfl: 3   nortriptyline (PAMELOR) 50 MG capsule, TAKE 2 CAPSULES BY MOUTH NIGHTLY AS DIRECTED, Disp: 60 capsule, Rfl: 0   omeprazole (PRILOSEC) 40 MG capsule, Take 1 capsule (40 mg total) by mouth daily., Disp: 90 capsule, Rfl: 3   potassium chloride SA (KLOR-CON M) 20 MEQ tablet, Take 1 tablet (20 mEq total) by mouth 2 (two) times daily., Disp: 60 tablet, Rfl: 6   rivaroxaban (XARELTO) 20 MG TABS tablet, Take 1 tablet (20 mg total) by mouth daily with supper., Disp: 90 tablet, Rfl: 3   sertraline (ZOLOFT) 25 MG tablet, Take 1/2 tablet by mouth daily., Disp: 90 tablet, Rfl: 0   topiramate (TOPAMAX) 100 MG tablet, Take 1 tablet  by mouth 2  times daily., Disp: 180 tablet, Rfl: 3   vitamin E 1000 UNIT capsule, Take 1,000 Units by mouth daily., Disp: , Rfl:    fluticasone (FLONASE) 50 MCG/ACT nasal spray, Place 2 sprays into both nostrils daily. (Patient not taking: Reported  on 01/01/2022), Disp: 16 g, Rfl: 6   HYDROcodone bit-homatropine (HYCODAN) 5-1.5 MG/5ML syrup, Take 5 mLs by mouth every 6 (six) hours as needed for cough. (Patient not taking: Reported on 10/09/2021), Disp: 120 mL, Rfl: 0  Allergies:  Allergies  Allergen Reactions   Neosporin  [Neomycin-Bacitracin Zn-Polymyx] Rash   Sulfa Antibiotics Other (See Comments)    Acts "goofy"    Past Medical History, Surgical history, Social history, and Family History were reviewed and updated.  Review of Systems: Review of Systems  Constitutional: Negative.   HENT:  Negative.    Eyes: Negative.   Respiratory: Negative.    Cardiovascular: Negative.   Gastrointestinal: Negative.   Endocrine: Negative.    Genitourinary: Negative.    Musculoskeletal: Negative.   Skin: Negative.   Neurological: Negative.   Hematological: Negative.   Psychiatric/Behavioral: Negative.      Physical Exam:  height is '5\' 7"'$  (1.702 m) and weight is 287 lb (130.2 kg). Her oral temperature is 97.9 F (36.6 C). Her blood pressure is 114/63 and her pulse is 88. Her respiration is 20 and oxygen saturation is 100%.   Wt Readings from Last 3 Encounters:  01/01/22 287 lb (130.2 kg)  10/16/21 281 lb (127.5 kg)  10/01/21 294 lb (133.4 kg)    Physical Exam Vitals reviewed.  HENT:     Head: Normocephalic and atraumatic.  Eyes:     Pupils: Pupils are equal, round, and reactive to light.  Cardiovascular:     Rate and Rhythm: Normal rate and regular rhythm.     Heart sounds: Normal heart sounds.  Pulmonary:     Effort: Pulmonary effort is normal.     Breath sounds: Normal breath sounds.  Abdominal:     General: Bowel sounds are normal.     Palpations: Abdomen is soft.  Musculoskeletal:        General: No tenderness or deformity. Normal range of motion.     Cervical back: Normal range of motion.     Comments: Lower extremity shows a little bit of swelling and pain in the left lower extremity.  She has a positive Homans' sign in the left leg.  Right leg is unremarkable.  She does have good pulses in her distal extremities.  Lymphadenopathy:     Cervical: No cervical adenopathy.  Skin:    General: Skin is warm and dry.     Findings: No erythema or rash.  Neurological:     Mental Status: She is alert and oriented to person, place, and time.  Psychiatric:        Behavior: Behavior normal.        Thought Content: Thought content normal.        Judgment: Judgment normal.     Lab Results  Component Value Date   WBC 6.7 01/01/2022   HGB 11.5 (L) 01/01/2022   HCT 35.8 (L) 01/01/2022   MCV 86.5 01/01/2022   PLT 358 01/01/2022     Chemistry      Component Value Date/Time   NA 134 (L) 10/16/2021 1500   K  3.4 (L) 10/16/2021 1500   CL 102 10/16/2021 1500   CO2 21 (L) 10/16/2021 1500   BUN 20 10/16/2021 1500   CREATININE 0.90 10/16/2021 1500   CREATININE 0.84 11/15/2019 1516      Component Value Date/Time   CALCIUM 9.3 10/16/2021 1500   ALKPHOS 90 10/16/2021 1500   AST 13 (L) 10/16/2021 1500   ALT 15 10/16/2021 1500  BILITOT 0.3 10/16/2021 1500      Impression and Plan: Ms. Bocanegra is a very charming 56 year old white female.  She has a localized rectal cancer.  By staging studies looks like this is a stage IIA.  We tried her on neoadjuvant chemotherapy.  She really had a miserable time with neoadjuvant therapy.  No matter what we try to, she just had a hard time.  However, she had a great response.  There is no identifiable malignancy with respect to the surgical specimen.  At this point, we will just follow her along.  Will do another MRI probably in early March.  I would like to think that this will not show any evidence of recurrence.  She was treated very aggressively.  She had a great surgery.  She is still on the Xarelto.  Again, we will probably stop this when we see her back in March.  She still has a Port-A-Cath in.  With the Port-A-Cath, we will keep this in for right now.  I will plan to see her back after she has her MRI.   Volanda Napoleon, MD 12/20/20232:11 PM

## 2022-01-02 ENCOUNTER — Encounter: Payer: Self-pay | Admitting: Family Medicine

## 2022-01-02 LAB — HGB A1C W/O EAG: Hgb A1c MFr Bld: 6.8 % — ABNORMAL HIGH (ref 4.8–5.6)

## 2022-01-17 ENCOUNTER — Other Ambulatory Visit (HOSPITAL_COMMUNITY): Payer: Self-pay

## 2022-01-24 ENCOUNTER — Encounter: Payer: Self-pay | Admitting: Family Medicine

## 2022-01-24 ENCOUNTER — Other Ambulatory Visit (HOSPITAL_COMMUNITY): Payer: Self-pay

## 2022-01-24 ENCOUNTER — Ambulatory Visit (INDEPENDENT_AMBULATORY_CARE_PROVIDER_SITE_OTHER): Payer: BC Managed Care – PPO | Admitting: Family Medicine

## 2022-01-24 VITALS — BP 123/68 | HR 95 | Temp 98.1°F | Resp 16 | Ht 67.0 in | Wt 288.0 lb

## 2022-01-24 DIAGNOSIS — R21 Rash and other nonspecific skin eruption: Secondary | ICD-10-CM

## 2022-01-24 MED ORDER — TRIAMCINOLONE ACETONIDE 0.1 % EX CREA
1.0000 | TOPICAL_CREAM | Freq: Two times a day (BID) | CUTANEOUS | 0 refills | Status: DC
  Filled 2022-01-24: qty 30, 15d supply, fill #0

## 2022-01-24 NOTE — Progress Notes (Signed)
   Acute Office Visit  Subjective:     Patient ID: Janice Lucas, female    DOB: 15-Apr-1965, 56 y.o.   MRN: [id number]  Chief Complaint  Patient presents with   Rash    Upper belly with red bumps     Patient is in today for rash.   Patient reports that when she got out of the shower yesterday she noticed some red bumps across her abdomen and has since noticed a few on lower extremities, back, and neck. She denies any surrounding erythema, inflammation, streaking, drainage, itching, pain, fevers, chills, fatigue, or other systemic symptoms. No recent changes to diet, medications, detergents, soaps, etc.  She has not been around anyone with similar symptoms, but reports her stepson has shingles and she has never had chicken pox before. Additionally, she reports it is always possible that her dogs have fleas, but she has not seen any or noticed any itching.    ROS:  A comprehensive ROS was completed and negative except as noted per HPI      Objective:    BP 123/68   Pulse 95   Temp 98.1 F (36.7 C)   Resp 16   Ht '5\' 7"'$  (1.702 m)   Wt 288 lb (130.6 kg)   LMP 01/29/2011   SpO2 96%   BMI 45.11 kg/m    Physical Exam Constitutional:      Appearance: Normal appearance.  HENT:     Head: Normocephalic and atraumatic.  Skin:    General: Skin is warm and dry.     Comments: Scattered erythematous lesions to trunk, lower legs, neck; a few with vesicular appearance, others open/scabbed from scratching, no signs of infection  Neurological:     General: No focal deficit present.     Mental Status: She is alert and oriented to person, place, and time. Mental status is at baseline.  Psychiatric:        Mood and Affect: Mood normal.        Behavior: Behavior normal.        Thought Content: Thought content normal.        Judgment: Judgment normal.     No results found for any visits on 01/24/22.      Assessment & Plan:   Problem List Items Addressed This Visit    None Visit Diagnoses     Rash    -  Primary Scattered eruptions at different stages as described above. Consider possibly chicken pox given recent shingles exposure and no personal history of varicella infection. She is not currently symptomatic with them. Prescribing triamcinolone in case they become itchy. Encouraged she continue to monitor for any changes and discussed methods to prevent infection.  Patient aware of signs/symptoms requiring further/urgent evaluation.      Relevant Medications   triamcinolone cream (KENALOG) 0.1 %       Meds ordered this encounter  Medications   triamcinolone cream (KENALOG) 0.1 %    Sig: Apply 1 Application topically 2 (two) times daily.    Dispense:  30 g    Refill:  0    Order Specific Question:   Supervising Provider    Answer:   Penni Homans A [[id number]]    Return if symptoms worsen or fail to improve.  Terrilyn Saver, NP

## 2022-01-27 ENCOUNTER — Other Ambulatory Visit (HOSPITAL_COMMUNITY): Payer: Self-pay

## 2022-01-27 ENCOUNTER — Telehealth: Payer: Self-pay | Admitting: Family Medicine

## 2022-01-27 ENCOUNTER — Encounter: Payer: Self-pay | Admitting: Family Medicine

## 2022-01-27 DIAGNOSIS — B019 Varicella without complication: Secondary | ICD-10-CM

## 2022-01-27 MED ORDER — VALACYCLOVIR HCL 1 G PO TABS
1000.0000 mg | ORAL_TABLET | Freq: Three times a day (TID) | ORAL | 0 refills | Status: AC
  Filled 2022-01-27: qty 15, 5d supply, fill #0

## 2022-01-27 NOTE — Telephone Encounter (Signed)
Patient's spouse, Aimee, called back to say that she thinks patient may have chicken pox because her son just had the shingles and patient was doing his laundry and she has never had chicken pox. Blisters covering her trunk and hair and a couple on her legs. Aimee said they are not itching her. Please advise if something can be called in or what they should do. Patient was just in on Friday to see [facility].

## 2022-01-28 NOTE — Telephone Encounter (Signed)
Called her back- she first noted rash several days ago by Lovena Le with chicken pox type rash.  Lovena Le gave her appropriate treatment and I agree with her plan Pt just seemed to want to tell me about it- however otherwise nothing has really changed

## 2022-01-28 NOTE — Telephone Encounter (Signed)
Please advise 

## 2022-02-12 ENCOUNTER — Inpatient Hospital Stay: Payer: BC Managed Care – PPO | Attending: Hematology & Oncology

## 2022-02-12 ENCOUNTER — Inpatient Hospital Stay: Payer: BC Managed Care – PPO

## 2022-02-12 DIAGNOSIS — Z85048 Personal history of other malignant neoplasm of rectum, rectosigmoid junction, and anus: Secondary | ICD-10-CM | POA: Diagnosis not present

## 2022-02-12 DIAGNOSIS — Z452 Encounter for adjustment and management of vascular access device: Secondary | ICD-10-CM | POA: Diagnosis not present

## 2022-02-12 DIAGNOSIS — Z95828 Presence of other vascular implants and grafts: Secondary | ICD-10-CM

## 2022-02-12 MED ORDER — HEPARIN SOD (PORK) LOCK FLUSH 100 UNIT/ML IV SOLN
500.0000 [IU] | Freq: Once | INTRAVENOUS | Status: AC
  Administered 2022-02-12: 500 [IU] via INTRAVENOUS

## 2022-02-12 MED ORDER — SODIUM CHLORIDE 0.9% FLUSH
10.0000 mL | Freq: Once | INTRAVENOUS | Status: AC
  Administered 2022-02-12: 10 mL via INTRAVENOUS

## 2022-02-12 NOTE — Progress Notes (Signed)
Pt here for Port flush, request to "stop blood thinner when  I run out, in a week or two?" Reviewed with MD, who gave VO ok to stop blood thinner when pt runs out of current rx.  Gave pt MD instructions. Pt verbalized understanding. No further concerns.

## 2022-02-12 NOTE — Patient Instructions (Signed)

## 2022-02-20 ENCOUNTER — Other Ambulatory Visit (HOSPITAL_COMMUNITY): Payer: Self-pay

## 2022-02-20 ENCOUNTER — Other Ambulatory Visit: Payer: Self-pay | Admitting: Adult Health

## 2022-02-20 DIAGNOSIS — F4321 Adjustment disorder with depressed mood: Secondary | ICD-10-CM

## 2022-02-20 MED ORDER — SERTRALINE HCL 25 MG PO TABS
12.5000 mg | ORAL_TABLET | Freq: Every day | ORAL | 0 refills | Status: DC
  Filled 2022-02-20: qty 45, 90d supply, fill #0
  Filled 2022-05-25 – 2022-05-26 (×2): qty 45, 90d supply, fill #1

## 2022-02-21 ENCOUNTER — Other Ambulatory Visit (HOSPITAL_COMMUNITY): Payer: Self-pay

## 2022-02-21 ENCOUNTER — Telehealth: Payer: Self-pay | Admitting: Neurology

## 2022-02-21 MED ORDER — NORTRIPTYLINE HCL 50 MG PO CAPS
100.0000 mg | ORAL_CAPSULE | Freq: Every evening | ORAL | 1 refills | Status: DC
  Filled 2022-02-21: qty 180, 90d supply, fill #0
  Filled 2022-06-27: qty 180, 90d supply, fill #1

## 2022-02-21 NOTE — Telephone Encounter (Signed)
Pt called in and scheduled a follow up visit for 07/01/22. She needs her nortriptyline refilled at [place]

## 2022-02-21 NOTE — Telephone Encounter (Signed)
Refill sent in for pt. 

## 2022-02-24 ENCOUNTER — Other Ambulatory Visit (HOSPITAL_COMMUNITY): Payer: Self-pay

## 2022-02-24 ENCOUNTER — Encounter (INDEPENDENT_AMBULATORY_CARE_PROVIDER_SITE_OTHER): Payer: BC Managed Care – PPO | Admitting: Family Medicine

## 2022-02-24 ENCOUNTER — Other Ambulatory Visit: Payer: Self-pay | Admitting: Neurology

## 2022-02-24 DIAGNOSIS — J40 Bronchitis, not specified as acute or chronic: Secondary | ICD-10-CM

## 2022-02-24 MED ORDER — DOXYCYCLINE HYCLATE 100 MG PO CAPS
100.0000 mg | ORAL_CAPSULE | Freq: Two times a day (BID) | ORAL | 0 refills | Status: DC
  Filled 2022-02-24: qty 20, 10d supply, fill #0

## 2022-02-24 NOTE — Telephone Encounter (Signed)

## 2022-02-25 ENCOUNTER — Other Ambulatory Visit (HOSPITAL_COMMUNITY): Payer: Self-pay

## 2022-02-25 MED ORDER — NORTRIPTYLINE HCL 50 MG PO CAPS
100.0000 mg | ORAL_CAPSULE | Freq: Every evening | ORAL | 0 refills | Status: DC
  Filled 2022-02-25 – 2022-05-25 (×2): qty 60, 30d supply, fill #0

## 2022-02-26 ENCOUNTER — Other Ambulatory Visit: Payer: Self-pay

## 2022-02-27 ENCOUNTER — Other Ambulatory Visit (HOSPITAL_COMMUNITY): Payer: Self-pay

## 2022-03-12 ENCOUNTER — Other Ambulatory Visit (HOSPITAL_COMMUNITY): Payer: Self-pay

## 2022-03-12 ENCOUNTER — Other Ambulatory Visit: Payer: Self-pay

## 2022-03-12 ENCOUNTER — Other Ambulatory Visit: Payer: Self-pay | Admitting: Family Medicine

## 2022-03-12 DIAGNOSIS — E785 Hyperlipidemia, unspecified: Secondary | ICD-10-CM

## 2022-03-12 MED ORDER — ATORVASTATIN CALCIUM 20 MG PO TABS
20.0000 mg | ORAL_TABLET | Freq: Every day | ORAL | 3 refills | Status: DC
  Filled 2022-03-12: qty 90, 90d supply, fill #0
  Filled 2022-06-15: qty 90, 90d supply, fill #1
  Filled 2022-09-04: qty 90, 90d supply, fill #2

## 2022-03-19 ENCOUNTER — Ambulatory Visit (HOSPITAL_COMMUNITY)
Admission: RE | Admit: 2022-03-19 | Discharge: 2022-03-19 | Disposition: A | Discharge status: Home / Self Care | Payer: BC Managed Care – PPO | Source: Ambulatory Visit | Attending: Hematology & Oncology | Admitting: Hematology & Oncology

## 2022-03-19 DIAGNOSIS — C20 Malignant neoplasm of rectum: Secondary | ICD-10-CM

## 2022-03-20 ENCOUNTER — Encounter: Payer: Self-pay | Admitting: *Deleted

## 2022-03-23 ENCOUNTER — Other Ambulatory Visit: Payer: Self-pay | Admitting: Family Medicine

## 2022-03-23 DIAGNOSIS — I10 Essential (primary) hypertension: Secondary | ICD-10-CM

## 2022-03-24 ENCOUNTER — Other Ambulatory Visit (HOSPITAL_COMMUNITY): Payer: Self-pay

## 2022-03-24 MED ORDER — LISINOPRIL-HYDROCHLOROTHIAZIDE 20-25 MG PO TABS
1.0000 | ORAL_TABLET | Freq: Every day | ORAL | 0 refills | Status: DC
  Filled 2022-03-24 – 2022-04-02 (×2): qty 90, 90d supply, fill #0

## 2022-04-01 ENCOUNTER — Other Ambulatory Visit (HOSPITAL_COMMUNITY): Payer: Self-pay

## 2022-04-01 DIAGNOSIS — C20 Malignant neoplasm of rectum: Secondary | ICD-10-CM | POA: Diagnosis not present

## 2022-04-02 ENCOUNTER — Inpatient Hospital Stay: Payer: BC Managed Care – PPO

## 2022-04-02 ENCOUNTER — Other Ambulatory Visit (HOSPITAL_COMMUNITY): Payer: Self-pay

## 2022-04-02 ENCOUNTER — Inpatient Hospital Stay: Payer: BC Managed Care – PPO | Admitting: Hematology & Oncology

## 2022-04-03 ENCOUNTER — Other Ambulatory Visit (HOSPITAL_COMMUNITY): Payer: Self-pay

## 2022-04-06 ENCOUNTER — Other Ambulatory Visit: Payer: Self-pay | Admitting: Gastroenterology

## 2022-04-06 DIAGNOSIS — C20 Malignant neoplasm of rectum: Secondary | ICD-10-CM

## 2022-04-08 ENCOUNTER — Inpatient Hospital Stay (HOSPITAL_BASED_OUTPATIENT_CLINIC_OR_DEPARTMENT_OTHER): Payer: BC Managed Care – PPO | Admitting: Family

## 2022-04-08 ENCOUNTER — Other Ambulatory Visit: Payer: Self-pay

## 2022-04-08 ENCOUNTER — Inpatient Hospital Stay: Payer: BC Managed Care – PPO

## 2022-04-08 ENCOUNTER — Inpatient Hospital Stay: Payer: BC Managed Care – PPO | Attending: Hematology & Oncology

## 2022-04-08 ENCOUNTER — Encounter: Payer: Self-pay | Admitting: Family

## 2022-04-08 VITALS — BP 118/79 | HR 92 | Temp 97.9°F | Resp 19 | Ht 67.0 in | Wt 295.0 lb

## 2022-04-08 DIAGNOSIS — Z85048 Personal history of other malignant neoplasm of rectum, rectosigmoid junction, and anus: Secondary | ICD-10-CM | POA: Insufficient documentation

## 2022-04-08 DIAGNOSIS — Z452 Encounter for adjustment and management of vascular access device: Secondary | ICD-10-CM | POA: Diagnosis not present

## 2022-04-08 DIAGNOSIS — E876 Hypokalemia: Secondary | ICD-10-CM | POA: Diagnosis not present

## 2022-04-08 DIAGNOSIS — C20 Malignant neoplasm of rectum: Secondary | ICD-10-CM | POA: Diagnosis not present

## 2022-04-08 DIAGNOSIS — Z95828 Presence of other vascular implants and grafts: Secondary | ICD-10-CM

## 2022-04-08 DIAGNOSIS — Z9221 Personal history of antineoplastic chemotherapy: Secondary | ICD-10-CM | POA: Diagnosis not present

## 2022-04-08 LAB — CBC WITH DIFFERENTIAL (CANCER CENTER ONLY)
Abs Immature Granulocytes: 0.03 10*3/uL (ref 0.00–0.07)
Basophils Absolute: 0 10*3/uL (ref 0.0–0.1)
Basophils Relative: 0 %
Eosinophils Absolute: 0.3 10*3/uL (ref 0.0–0.5)
Eosinophils Relative: 3 %
HCT: 35.5 % — ABNORMAL LOW (ref 36.0–46.0)
Hemoglobin: 11.4 g/dL — ABNORMAL LOW (ref 12.0–15.0)
Immature Granulocytes: 0 %
Lymphocytes Relative: 27 %
Lymphs Abs: 2.1 10*3/uL (ref 0.7–4.0)
MCH: 26.8 pg (ref 26.0–34.0)
MCHC: 32.1 g/dL (ref 30.0–36.0)
MCV: 83.5 fL (ref 80.0–100.0)
Monocytes Absolute: 0.5 10*3/uL (ref 0.1–1.0)
Monocytes Relative: 7 %
Neutro Abs: 5 10*3/uL (ref 1.7–7.7)
Neutrophils Relative %: 63 %
Platelet Count: 284 10*3/uL (ref 150–400)
RBC: 4.25 MIL/uL (ref 3.87–5.11)
RDW: 14.5 % (ref 11.5–15.5)
WBC Count: 8 10*3/uL (ref 4.0–10.5)
nRBC: 0 % (ref 0.0–0.2)

## 2022-04-08 LAB — CMP (CANCER CENTER ONLY)
ALT: 14 U/L (ref 0–44)
AST: 17 U/L (ref 15–41)
Albumin: 3.6 g/dL (ref 3.5–5.0)
Alkaline Phosphatase: 84 U/L (ref 38–126)
Anion gap: 7 (ref 5–15)
BUN: 15 mg/dL (ref 6–20)
CO2: 23 mmol/L (ref 22–32)
Calcium: 8.3 mg/dL — ABNORMAL LOW (ref 8.9–10.3)
Chloride: 107 mmol/L (ref 98–111)
Creatinine: 0.8 mg/dL (ref 0.44–1.00)
GFR, Estimated: >60 mL/min (ref >60)
Glucose, Bld: 96 mg/dL (ref 70–99)
Potassium: 2.9 mmol/L — ABNORMAL LOW (ref 3.5–5.1)
Sodium: 137 mmol/L (ref 135–145)
Total Bilirubin: 0.4 mg/dL (ref 0.3–1.2)
Total Protein: 7.6 g/dL (ref 6.5–8.1)

## 2022-04-08 LAB — LACTATE DEHYDROGENASE: LDH: 200 U/L — ABNORMAL HIGH (ref 98–192)

## 2022-04-08 LAB — CEA (IN HOUSE-CHCC): CEA (CHCC-In House): 2.36 ng/mL (ref 0.00–5.00)

## 2022-04-08 MED ORDER — SODIUM CHLORIDE 0.9% FLUSH
10.0000 mL | INTRAVENOUS | Status: DC | PRN
  Administered 2022-04-08: 10 mL via INTRAVENOUS

## 2022-04-08 MED ORDER — HEPARIN SOD (PORK) LOCK FLUSH 100 UNIT/ML IV SOLN
500.0000 [IU] | Freq: Once | INTRAVENOUS | Status: AC
  Administered 2022-04-08: 500 [IU] via INTRAVENOUS

## 2022-04-08 NOTE — Progress Notes (Signed)
Hematology and Oncology Follow Up Visit  JERELENE SWAGGERTY [id number] 1965-10-23 57 y.o. 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 Macmurray is here today for follow-up. She is doing well and has no complaints at this time.  She was able to see Dr. Morton Stall and he recommends colonoscopy be done in September. She plans to contact Dr. Lyndel Safe for follow-up. She had one episodes of bright red blood on her toilet tissues last week after straining with constipation. She states that she varies back and forth between constipation and diarrhea.  No other blood loss noted. No bruising or petechiae.  Potassium is low at 2.9.  No fever, chills, n/v, cough, rash, dizziness, SOB, chest pain, palpitations, abdominal pain or changes in bowel or bladder habits.  No swelling or tenderness in her extremities.  Neuropathy in her feet unchanged from baseline.  No falls or syncope reported.  Appetite and hydration are good. Weight is 295 lbs.   ECOG Performance Status: 0 - Asymptomatic  Medications:  Allergies as of 04/08/2022       Reactions   Sulfamethoxazole Other (See Comments)   Neomycin-bacitracin Zn-polymyx Rash   Sulfa Antibiotics Other (See Comments)   Acts "goofy"        Medication List        Accurate as of April 08, 2022  3:21 PM. If you have any questions, ask your nurse or doctor.          STOP taking these medications    doxycycline 100 MG capsule Commonly known as: VIBRAMYCIN Stopped by: Lottie Dawson, NP   Xarelto 20 MG Tabs tablet Generic drug: rivaroxaban Stopped by: Lottie Dawson, NP       TAKE these medications     acetaminophen 500 MG tablet Commonly known as: TYLENOL Take 500 mg by mouth every 6 (six) hours as needed.   acetaZOLAMIDE ER 500 MG capsule Commonly known as: DIAMOX Take 1 capsule (500 mg total) by mouth every night   atorvastatin 20 MG tablet Commonly known as: LIPITOR Take 1 tablet  by mouth daily.   Biotin 10000 MCG Tabs Take by mouth daily.   CALCIUM 1000 + D PO Take by mouth 2 (two) times daily.   cholecalciferol 25 MCG (1000 UNIT) tablet Commonly known as: VITAMIN D3 Take 1,000 Units by mouth daily.   dicyclomine 10 MG capsule Commonly known as: BENTYL Take 1 capsule (10 mg total) by mouth 4 (four) times daily -  before meals and at bedtime.   fluticasone 50 MCG/ACT nasal spray Commonly known as: FLONASE Place 2 sprays into both nostrils daily.   HYDROcodone bit-homatropine 5-1.5 MG/5ML syrup Commonly known as: Hycodan Take 5 mLs by mouth every 6 (six) hours as needed for cough.   lisinopril-hydrochlorothiazide 20-25 MG tablet Commonly known as: ZESTORETIC Take 1 tablet by mouth daily.   nortriptyline 50 MG capsule Commonly known as: PAMELOR Take 2 capsules (100 mg total) by mouth every night as directed   nortriptyline 50 MG capsule Commonly known as: PAMELOR Take 2 capsules (100 mg total) by mouth at bedtime.   omeprazole 40 MG capsule Commonly known as: PRILOSEC  Take 1 capsule (40 mg total) by mouth daily.   potassium chloride SA 20 MEQ tablet Commonly known as: KLOR-CON M Take 1 tablet (20 mEq total) by mouth 2 (two) times daily.   sertraline 25 MG tablet Commonly known as: ZOLOFT Take 1/2 tablet by mouth daily.   topiramate 100 MG tablet Commonly known as: TOPAMAX Take 1 tablet  by mouth 2  times daily.   triamcinolone cream 0.1 % Commonly known as: KENALOG Apply 1 Application topically 2 (two) times daily.   vitamin C 1000 MG tablet Take 1,000 mg by mouth daily.   vitamin E 1000 UNIT capsule Take 1,000 Units by mouth daily.         Allergies:  Allergies  Allergen Reactions   Sulfamethoxazole Other (See Comments)   Neomycin-Bacitracin Zn-Polymyx Rash   Sulfa Antibiotics Other (See Comments)    Acts "goofy"    Past Medical History, Surgical history, Social history, and Family History were reviewed and updated.  Review of Systems: All other 10 point review of systems is negative.   Physical Exam:  height is 5\' 7"  (1.702 m) and weight is 295 lb (133.8 kg). Her oral temperature is 97.9 F (36.6 C). Her blood pressure is 118/79 and her pulse is 92. Her respiration is 19 and oxygen saturation is 100%.   Wt Readings from Last 3 Encounters:  04/08/22 295 lb (133.8 kg)  01/24/22 288 lb (130.6 kg)  01/01/22 287 lb (130.2 kg)    Ocular: Sclerae unicteric, pupils equal, round and reactive to light Ear-nose-throat: Oropharynx clear, dentition fair Lymphatic: No cervical or supraclavicular adenopathy Lungs no rales or rhonchi, good excursion bilaterally Heart regular rate and rhythm, no murmur appreciated Abd soft, nontender, positive bowel sounds MSK no focal spinal tenderness, no joint edema Neuro: non-focal, well-oriented, appropriate affect Breasts: Deferred   Lab Results  Component Value Date   WBC 8.0 04/08/2022   HGB 11.4 (L) 04/08/2022   HCT 35.5 (L) 04/08/2022   MCV 83.5 04/08/2022   PLT 284 04/08/2022   Lab Results  Component Value Date   FERRITIN 99 06/17/2021   IRON 48 06/17/2021   TIBC 311 06/17/2021   UIBC 263 06/17/2021   IRONPCTSAT 15 06/17/2021   Lab Results  Component Value Date   RBC 4.25 04/08/2022   No results found for: "KPAFRELGTCHN", "LAMBDASER", "KAPLAMBRATIO" No results found for: "IGGSERUM", "IGA", "IGMSERUM" No results found for: "TOTALPROTELP", "ALBUMINELP", "A1GS", "A2GS", "BETS", "BETA2SER", "GAMS", "MSPIKE", "SPEI"   Chemistry      Component Value Date/Time   NA 138 01/01/2022 1346   K 3.2 (L) 01/01/2022 1346   CL 104 01/01/2022 1346   CO2 23 01/01/2022  1346   BUN 15 01/01/2022 1346   CREATININE 0.76 01/01/2022 1346   CREATININE 0.84 11/15/2019 1516      Component Value Date/Time   CALCIUM 9.0 01/01/2022 1346   ALKPHOS 73 01/01/2022 1346   AST 12 (L) 01/01/2022 1346   ALT 14 01/01/2022 1346   BILITOT 0.3 01/01/2022 1346       Impression and Plan: Ms. Cammer is a very charming 57 yo white female with localized rectal cancer, stage IIA.  She struggled with tolerating neoadjuvant chemotherapy but did have a great response.  She is doing well at this time. CEA is stable at 2.36.  Xarelto completed.  Colonoscopy is planned for September with Dr. Lyndel Safe.  We will get a CT of the chest to evaluate this week.  We will repeat her MRI in  July.  She will take KDUR 40 meq PO BID for 7 days.  Follow-up in 3 months.   Lottie Dawson, NP 3/26/20243:21 PM

## 2022-04-09 ENCOUNTER — Other Ambulatory Visit (HOSPITAL_COMMUNITY): Payer: Self-pay

## 2022-04-09 MED ORDER — POTASSIUM CHLORIDE CRYS ER 20 MEQ PO TBCR
20.0000 meq | EXTENDED_RELEASE_TABLET | Freq: Two times a day (BID) | ORAL | 6 refills | Status: DC
  Filled 2022-04-09 – 2022-05-01 (×3): qty 60, 30d supply, fill #0
  Filled 2022-06-15: qty 60, 30d supply, fill #1
  Filled 2022-07-21: qty 60, 30d supply, fill #2
  Filled 2022-09-04: qty 60, 30d supply, fill #3
  Filled 2022-10-13: qty 60, 30d supply, fill #4
  Filled 2022-11-28 – 2022-12-10 (×2): qty 60, 30d supply, fill #5
  Filled 2023-01-01 – 2023-01-02 (×2): qty 60, 30d supply, fill #6

## 2022-04-10 ENCOUNTER — Encounter: Payer: Self-pay | Admitting: Family Medicine

## 2022-04-10 ENCOUNTER — Encounter: Payer: Self-pay | Admitting: Family

## 2022-04-11 ENCOUNTER — Other Ambulatory Visit (HOSPITAL_COMMUNITY): Payer: Self-pay

## 2022-04-14 ENCOUNTER — Encounter: Payer: Self-pay | Admitting: Family Medicine

## 2022-04-14 DIAGNOSIS — M25561 Pain in right knee: Secondary | ICD-10-CM

## 2022-04-17 ENCOUNTER — Ambulatory Visit: Payer: Self-pay

## 2022-04-17 ENCOUNTER — Ambulatory Visit (HOSPITAL_BASED_OUTPATIENT_CLINIC_OR_DEPARTMENT_OTHER)
Admission: RE | Admit: 2022-04-17 | Discharge: 2022-04-17 | Disposition: A | Discharge status: Home / Self Care | Payer: BC Managed Care – PPO | Source: Ambulatory Visit | Attending: Family Medicine | Admitting: Family Medicine

## 2022-04-17 ENCOUNTER — Ambulatory Visit: Payer: BC Managed Care – PPO | Admitting: Family Medicine

## 2022-04-17 VITALS — BP 118/70 | Ht 67.0 in | Wt 290.0 lb

## 2022-04-17 DIAGNOSIS — E2839 Other primary ovarian failure: Secondary | ICD-10-CM

## 2022-04-17 DIAGNOSIS — M25561 Pain in right knee: Secondary | ICD-10-CM

## 2022-04-17 DIAGNOSIS — M84453A Pathological fracture, unspecified femur, initial encounter for fracture: Secondary | ICD-10-CM | POA: Insufficient documentation

## 2022-04-17 DIAGNOSIS — M23204 Derangement of unspecified medial meniscus due to old tear or injury, left knee: Secondary | ICD-10-CM | POA: Diagnosis not present

## 2022-04-17 DIAGNOSIS — Z780 Asymptomatic menopausal state: Secondary | ICD-10-CM | POA: Diagnosis not present

## 2022-04-17 NOTE — Assessment & Plan Note (Signed)
Acutely occurring.  Likely exacerbated with trying to offset to the right knee. -Counseled on home exercise therapy and supportive care -Counseled on Voltaren -Consider further imaging or physical therapy

## 2022-04-17 NOTE — Assessment & Plan Note (Signed)
Findings today are more significant at the medial femoral condyle and she has a history of cancer with treatment. -Check a bone density.

## 2022-04-17 NOTE — Patient Instructions (Signed)
Nice to meet you Please try ice as needed  Please try the brace  Please try the exercises  Please try vitamin D3 and vitamin K2  We'll call with the results from today  We have ordered a bone density for downstairs  Please send me a message in MyChart with any questions or updates.  Please see me back in 2 weeks.   --Dr. Raeford Razor

## 2022-04-17 NOTE — Assessment & Plan Note (Addendum)
Acutely occurring.  Symptoms most consistent with pain origin at the medial femoral condyle.   -Counseled on home exercise therapy and supportive care. -Hinged knee brace. -Green sport insoles. -X-ray -Could consider physical therapy or further imaging

## 2022-04-17 NOTE — Progress Notes (Signed)
  Janice Lucas - 57 y.o. female MRN [id number]  Date of birth: Dec 31, 1965  SUBJECTIVE:  Including CC & ROS.  No chief complaint on file.   Janice Lucas is a 57 y.o. female that is presenting with acute right knee pain and acute left knee pain.  The pain in the left knee is not as severe as the right.  It is localized to the medial aspect.  No injury or inciting event.  Ongoing for the past week.  Localized to the knee.  No history of surgery.    Review of Systems See HPI   HISTORY: Past Medical, Surgical, Social, and Family History Reviewed & Updated per EMR.   Pertinent Historical Findings include:  Past Medical History:  Diagnosis Date   Allergy    SEASONAL - MILD   GERD (gastroesophageal reflux disease)    ON OMEPRAZOLE   Goals of care, counseling/discussion 04/26/2021   Hyperlipidemia    ON MEDS   Hypertension    ON MEDS   Rectal cancer ([place]) 04/26/2021   Sleep apnea    WEARS CPAP    Past Surgical History:  Procedure Laterality Date   ANTERIOR CERVICAL DECOMP/DISCECTOMY FUSION  02/14/2011   Procedure: ANTERIOR CERVICAL DECOMPRESSION/DISCECTOMY FUSION 1 LEVEL;  Surgeon: Earleen Newport, MD;  Location: [facility];  Service: Neurosurgery;  Laterality: N/A;  Anterior Cervical Six-Seven Decompression and Fusion   COLON SURGERY  09/2021   IR IMAGING GUIDED PORT INSERTION  05/02/2021     PHYSICAL EXAM:  VS: BP 118/70   Ht 5\' 7"  (1.702 m)   Wt 290 lb (131.5 kg)   LMP 01/29/2011   BMI 45.42 kg/m  Physical Exam Gen: NAD, alert, cooperative with exam, well-appearing MSK:  Neurovascularly intact    Limited ultrasound: Right knee and left knee pain:  Right knee: No effusion within the suprapatellar pouch. Normal-appearing quadricep and patellar tendon. No significant joint space narrowing medially. Increased hyperemia overlying the medial femoral condyle.  Left knee: No effusion. Mild inflammatory changes at the medial meniscus  Summary: Findings consistent  with insufficiency fracture of the medial femoral condyle of the right knee  Ultrasound and interpretation by Clearance Coots, MD    ASSESSMENT & PLAN:   Degenerative tear of left medial meniscus Acutely occurring.  Likely exacerbated with trying to offset to the right knee. -Counseled on home exercise therapy and supportive care -Counseled on Voltaren -Consider further imaging or physical therapy  Insufficiency fracture of medial femoral condyle Acutely occurring.  Symptoms most consistent with pain origin at the medial femoral condyle.   -Counseled on home exercise therapy and supportive care. -Hinged knee brace. -Green sport insoles. -X-ray -Could consider physical therapy or further imaging  Estrogen deficiency Findings today are more significant at the medial femoral condyle and she has a history of cancer with treatment. -Check a bone density.

## 2022-04-18 ENCOUNTER — Telehealth: Payer: Self-pay | Admitting: Family Medicine

## 2022-04-18 NOTE — Telephone Encounter (Signed)
Informed of results.   Myra Rude, MD [facility] 04/18/2022, 12:30 PM

## 2022-04-20 ENCOUNTER — Ambulatory Visit (HOSPITAL_BASED_OUTPATIENT_CLINIC_OR_DEPARTMENT_OTHER)
Admission: RE | Admit: 2022-04-20 | Discharge: 2022-04-20 | Disposition: A | Discharge status: Home / Self Care | Payer: BC Managed Care – PPO | Source: Ambulatory Visit | Attending: Family | Admitting: Family

## 2022-04-20 DIAGNOSIS — C20 Malignant neoplasm of rectum: Secondary | ICD-10-CM | POA: Diagnosis not present

## 2022-04-20 MED ORDER — IOHEXOL 300 MG/ML  SOLN
80.0000 mL | Freq: Once | INTRAMUSCULAR | Status: AC | PRN
  Administered 2022-04-20: 80 mL via INTRAVENOUS

## 2022-04-21 ENCOUNTER — Telehealth: Payer: Self-pay | Admitting: Family Medicine

## 2022-04-21 ENCOUNTER — Other Ambulatory Visit (HOSPITAL_COMMUNITY): Payer: Self-pay

## 2022-04-21 ENCOUNTER — Encounter: Payer: Self-pay | Admitting: Family

## 2022-04-21 NOTE — Telephone Encounter (Signed)
Informed of results.   Myra Rude, MD [facility] 04/21/2022, 9:29 AM

## 2022-04-22 ENCOUNTER — Other Ambulatory Visit (HOSPITAL_COMMUNITY): Payer: Self-pay

## 2022-04-28 ENCOUNTER — Encounter: Payer: Self-pay | Admitting: *Deleted

## 2022-05-01 ENCOUNTER — Ambulatory Visit: Payer: BC Managed Care – PPO | Admitting: Family Medicine

## 2022-05-01 ENCOUNTER — Other Ambulatory Visit (HOSPITAL_COMMUNITY): Payer: Self-pay

## 2022-05-06 ENCOUNTER — Ambulatory Visit: Payer: BC Managed Care – PPO | Admitting: Family Medicine

## 2022-05-13 ENCOUNTER — Ambulatory Visit: Payer: BC Managed Care – PPO | Admitting: Family Medicine

## 2022-05-14 ENCOUNTER — Ambulatory Visit: Payer: BC Managed Care – PPO | Admitting: Adult Health

## 2022-05-26 ENCOUNTER — Other Ambulatory Visit (HOSPITAL_COMMUNITY): Payer: Self-pay

## 2022-05-26 ENCOUNTER — Other Ambulatory Visit: Payer: Self-pay

## 2022-05-31 ENCOUNTER — Other Ambulatory Visit (HOSPITAL_COMMUNITY): Payer: Self-pay

## 2022-06-03 ENCOUNTER — Encounter: Payer: Self-pay | Admitting: Adult Health

## 2022-06-03 ENCOUNTER — Ambulatory Visit (INDEPENDENT_AMBULATORY_CARE_PROVIDER_SITE_OTHER): Payer: BC Managed Care – PPO | Admitting: Adult Health

## 2022-06-03 DIAGNOSIS — F4321 Adjustment disorder with depressed mood: Secondary | ICD-10-CM | POA: Diagnosis not present

## 2022-06-03 NOTE — Progress Notes (Signed)
Janice Lucas [id number] 1965/04/26 57 y.o.  Subjective:   Patient ID:  Janice Lucas is a 57 y.o. (DOB 1965-03-29) female.  Chief Complaint: No chief complaint on file.   HPI Janice Lucas presents to the office today for follow-up of grief reaction.  Describes mood today as "ok". Pleasant. Tearful at times. Mood symptoms - denies depression, anxiety, and irritability. Mood is consistent. Stating "I'm doing ok". No longer taking Zoloft. Recovered from cancer. Family doing well. Stable interest and motivation.Taking medication as prescribed. Energy levels stable. Active, does not have a regular exercise routine.  Enjoys some usual interests and activities. Mar[place], but [place] Mar[place]. Spending time with family. Appetite adequate. Weight stable. Sleeps well most nights. Averages 8 hours. Using CPAP nightly. Focus and concentration stable. Completing tasks. Managing aspects of household. Working full time 40 hours a week. Denies SI or HI.  Denies AH or VH.  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 in [facility] Primary Care at [facility] Office Visit from 01/03/2020 in [facility] Primary Care at [facility] Nutrition from 08/17/2014 in [facility] at [facility] Total Score 0 0 0      Flowsheet Row ED from 10/01/2021 in [facility] Emergency Department at [facility] ED from 08/09/2021 in [facility] Emergency Department at [facility] ED from 06/29/2021 in [facility] Emergency Department at [facility]  C-SSRS RISK CATEGORY No Risk No Risk No Risk        Review of Systems:  Review of Systems  Musculoskeletal:  Negative for gait problem.  Neurological:  Negative for tremors.  Psychiatric/Behavioral:         Please refer to HPI    Medications: I have reviewed the patient's current  medications.  Current Outpatient Medications  Medication Sig Dispense Refill   acetaminophen (TYLENOL) 500 MG tablet Take 500 mg by mouth every 6 (six) hours as needed.     acetaZOLAMIDE ER (DIAMOX) 500 MG capsule Take 1 capsule (500 mg total) by mouth every night 90 capsule 3   Ascorbic Acid (VITAMIN C) 1000 MG tablet Take 1,000 mg by mouth daily.     atorvastatin (LIPITOR) 20 MG tablet Take 1 tablet  by mouth daily. 90 tablet 3   Biotin 86578 MCG TABS Take by mouth daily.     Calcium Carb-Cholecalciferol (CALCIUM 1000 + D PO) Take by mouth 2 (two) times daily.     cholecalciferol (VITAMIN D3) 25 MCG (1000 UT) tablet Take 1,000 Units by mouth daily.     dicyclomine (BENTYL) 10 MG capsule Take 1 capsule (10 mg total) by mouth 4 (four) times daily -  before meals and at bedtime. 90 capsule 1   fluticasone (FLONASE) 50 MCG/ACT nasal spray Place 2 sprays into both nostrils daily. 16 g 6   HYDROcodone bit-homatropine (HYCODAN) 5-1.5 MG/5ML syrup Take 5 mLs by mouth every 6 (six) hours as needed for cough. 120 mL 0   lisinopril-hydrochlorothiazide (ZESTORETIC) 20-25 MG tablet Take 1 tablet by mouth daily. 90 tablet 0   nortriptyline (PAMELOR) 50 MG capsule Take 2 capsules (100 mg total) by mouth every night as directed 180 capsule 1   nortriptyline (PAMELOR) 50 MG capsule Take 2 capsules (100 mg total) by mouth at bedtime. 60 capsule 0   omeprazole (PRILOSEC) 40 MG capsule Take 1 capsule (40 mg  total) by mouth daily. 90 capsule 3   potassium chloride SA (KLOR-CON M) 20 MEQ tablet Take 1 tablet (20 mEq total) by mouth 2 (two) times daily. 60 tablet 6   sertraline (ZOLOFT) 25 MG tablet Take 1/2 tablet by mouth daily. 90 tablet 0   topiramate (TOPAMAX) 100 MG tablet Take 1 tablet  by mouth 2  times daily. 180 tablet 3   triamcinolone cream (KENALOG) 0.1 % Apply 1 Application topically 2 (two) times daily. 30 g 0   vitamin E 1000 UNIT capsule Take 1,000 Units by mouth daily.     No current  facility-administered medications for this visit.    Medication Side Effects: None  Allergies:  Allergies  Allergen Reactions   Sulfamethoxazole Other (See Comments)   Neomycin-Bacitracin Zn-Polymyx Rash   Sulfa Antibiotics Other (See Comments)    Acts "goofy"    Past Medical History:  Diagnosis Date   Allergy    SEASONAL - MILD   GERD (gastroesophageal reflux disease)    ON OMEPRAZOLE   Goals of care, counseling/discussion 04/26/2021   Hyperlipidemia    ON MEDS   Hypertension    ON MEDS   Rectal cancer ([facility]) 04/26/2021   Sleep apnea    WEARS CPAP    Past Medical History, Surgical history, Social history, and Family history were reviewed and updated as appropriate.   Please see review of systems for further details on the patient's review from today.   Objective:   Physical Exam:  LMP 01/29/2011   Physical Exam Constitutional:      General: She is not in acute distress. Musculoskeletal:        General: No deformity.  Neurological:     Mental Status: She is alert and oriented to person, place, and time.     Coordination: Coordination normal.  Psychiatric:        Attention and Perception: Attention and perception normal. She does not perceive auditory or visual hallucinations.        Mood and Affect: Mood normal. Mood is not anxious or depressed. Affect is not labile, blunt, angry or inappropriate.        Speech: Speech normal.        Behavior: Behavior normal.        Thought Content: Thought content normal. Thought content is not paranoid or delusional. Thought content does not include homicidal or suicidal ideation. Thought content does not include homicidal or suicidal plan.        Cognition and Memory: Cognition and memory normal.        Judgment: Judgment normal.     Comments: Insight intact     Lab Review:     Component Value Date/Time   NA 137 04/08/2022 1435   K 2.9 (L) 04/08/2022 1435   CL 107 04/08/2022 1435   CO2 23 04/08/2022 1435   GLUCOSE 96  04/08/2022 1435   BUN 15 04/08/2022 1435   CREATININE 0.80 04/08/2022 1435   CREATININE 0.84 11/15/2019 1516   CALCIUM 8.3 (L) 04/08/2022 1435   PROT 7.6 04/08/2022 1435   ALBUMIN 3.6 04/08/2022 1435   AST 17 04/08/2022 1435   ALT 14 04/08/2022 1435   ALKPHOS 84 04/08/2022 1435   BILITOT 0.4 04/08/2022 1435   GFRNONAA >60 04/08/2022 1435   GFRAA >90 02/14/2011 1019       Component Value Date/Time   WBC 8.0 04/08/2022 1435   WBC 8.6 06/29/2021 2208   RBC 4.25 04/08/2022 1435   HGB 11.4 (  L) 04/08/2022 1435   HCT 35.5 (L) 04/08/2022 1435   PLT 284 04/08/2022 1435   MCV 83.5 04/08/2022 1435   MCH 26.8 04/08/2022 1435   MCHC 32.1 04/08/2022 1435   RDW 14.5 04/08/2022 1435   LYMPHSABS 2.1 04/08/2022 1435   MONOABS 0.5 04/08/2022 1435   EOSABS 0.3 04/08/2022 1435   BASOSABS 0.0 04/08/2022 1435    No results found for: "POCLITH", "LITHIUM"   No results found for: "PHENYTOIN", "PHENOBARB", "VALPROATE", "CBMZ"   .res Assessment: Plan:    Plan:  PDMP reviewed  D/C Zoloft 25mg  - Taking 1/2 one tablet daily.  Patient advised to contact office with any questions, adverse effects, or acute worsening in signs and symptoms.   Time spent with patient was 10 minutes. Greater than 50% of face to face time with patient was spent on counseling and coordination of care.   RTC as needed.  There are no diagnoses linked to this encounter.   Please see After Visit Summary for patient specific instructions.  Future Appointments  Date Time Provider Department Center  06/10/2022  2:30 PM [facility] INJ NURSE [facility] None  07/01/2022  8:30 AM Van Clines, MD [facility] None  08/08/2022 11:30 AM [facility] LAB [facility] None  08/08/2022 11:45 AM [facility] INJ NURSE [facility] None  08/08/2022 12:00 PM Ennever, Rose Phi, MD [facility] None    No orders of the defined types were placed in this encounter.   -------------------------------

## 2022-06-10 ENCOUNTER — Inpatient Hospital Stay: Payer: BC Managed Care – PPO | Attending: Hematology & Oncology

## 2022-06-10 VITALS — BP 119/62 | HR 100 | Temp 98.2°F | Resp 19

## 2022-06-10 DIAGNOSIS — Z85048 Personal history of other malignant neoplasm of rectum, rectosigmoid junction, and anus: Secondary | ICD-10-CM | POA: Diagnosis not present

## 2022-06-10 DIAGNOSIS — I743 Embolism and thrombosis of arteries of the lower extremities: Secondary | ICD-10-CM

## 2022-06-10 DIAGNOSIS — Z452 Encounter for adjustment and management of vascular access device: Secondary | ICD-10-CM | POA: Diagnosis not present

## 2022-06-10 DIAGNOSIS — C20 Malignant neoplasm of rectum: Secondary | ICD-10-CM

## 2022-06-10 DIAGNOSIS — E876 Hypokalemia: Secondary | ICD-10-CM

## 2022-06-10 DIAGNOSIS — Z95828 Presence of other vascular implants and grafts: Secondary | ICD-10-CM

## 2022-06-10 DIAGNOSIS — R300 Dysuria: Secondary | ICD-10-CM

## 2022-06-10 MED ORDER — HEPARIN SOD (PORK) LOCK FLUSH 100 UNIT/ML IV SOLN
500.0000 [IU] | Freq: Once | INTRAVENOUS | Status: AC
  Administered 2022-06-10: 500 [IU] via INTRAVENOUS

## 2022-06-10 MED ORDER — SODIUM CHLORIDE 0.9% FLUSH
10.0000 mL | INTRAVENOUS | Status: DC | PRN
  Administered 2022-06-10: 10 mL via INTRAVENOUS

## 2022-06-10 NOTE — Patient Instructions (Signed)

## 2022-06-16 ENCOUNTER — Other Ambulatory Visit: Payer: Self-pay

## 2022-06-16 ENCOUNTER — Other Ambulatory Visit (HOSPITAL_COMMUNITY): Payer: Self-pay

## 2022-06-27 ENCOUNTER — Telehealth: Payer: Self-pay | Admitting: Neurology

## 2022-06-27 ENCOUNTER — Other Ambulatory Visit (HOSPITAL_COMMUNITY): Payer: Self-pay

## 2022-06-27 ENCOUNTER — Other Ambulatory Visit: Payer: Self-pay

## 2022-06-27 MED ORDER — NORTRIPTYLINE HCL 50 MG PO CAPS
100.0000 mg | ORAL_CAPSULE | Freq: Every evening | ORAL | 0 refills | Status: DC
  Filled 2022-06-27: qty 60, 30d supply, fill #0

## 2022-06-27 NOTE — Telephone Encounter (Signed)
Refill sent in for pt. 

## 2022-06-27 NOTE — Telephone Encounter (Signed)
Patient called to request refill on Nortriptyline and states she is out of medication. Per chart patient had refills sent on 02/21/22 #180 1rf and 02/25/22 #60. Per pharmacy patient does have refill from 02/21/22 to pick up. They will get this ready and contact patient. Nothing further needed at this time.

## 2022-06-27 NOTE — Telephone Encounter (Signed)
New message-  Patient is asking for refill to be called in.  nortriptyline (PAMELOR) 50 MG capsule   [facility] Phone: [phone]  Fax: [phone]

## 2022-07-01 ENCOUNTER — Ambulatory Visit (INDEPENDENT_AMBULATORY_CARE_PROVIDER_SITE_OTHER): Payer: BC Managed Care – PPO | Admitting: Neurology

## 2022-07-01 ENCOUNTER — Other Ambulatory Visit (HOSPITAL_COMMUNITY): Payer: Self-pay

## 2022-07-01 ENCOUNTER — Encounter: Payer: Self-pay | Admitting: Neurology

## 2022-07-01 VITALS — BP 111/71 | HR 95 | Ht 67.0 in | Wt 287.4 lb

## 2022-07-01 DIAGNOSIS — G932 Benign intracranial hypertension: Secondary | ICD-10-CM

## 2022-07-01 DIAGNOSIS — G43009 Migraine without aura, not intractable, without status migrainosus: Secondary | ICD-10-CM | POA: Diagnosis not present

## 2022-07-01 MED ORDER — TOPIRAMATE 100 MG PO TABS
100.0000 mg | ORAL_TABLET | Freq: Two times a day (BID) | ORAL | 3 refills | Status: DC
Start: 2022-07-01 — End: 2023-08-03
  Filled 2022-07-01: qty 180, 90d supply, fill #0
  Filled 2023-01-01: qty 180, 90d supply, fill #1
  Filled 2023-01-02: qty 174, 87d supply, fill #1
  Filled 2023-01-02: qty 6, 3d supply, fill #1
  Filled 2023-05-13: qty 180, 90d supply, fill #2

## 2022-07-01 MED ORDER — ACETAZOLAMIDE 250 MG PO TABS
ORAL_TABLET | ORAL | 0 refills | Status: DC
  Filled 2022-07-01: qty 14, 14d supply, fill #0

## 2022-07-01 MED ORDER — NORTRIPTYLINE HCL 50 MG PO CAPS
100.0000 mg | ORAL_CAPSULE | Freq: Every evening | ORAL | 3 refills | Status: DC
  Filled 2022-07-01 – 2022-09-04 (×2): qty 180, 90d supply, fill #0
  Filled 2022-11-28: qty 180, 90d supply, fill #1
  Filled 2023-03-22: qty 180, 90d supply, fill #2
  Filled 2023-06-07: qty 180, 90d supply, fill #3

## 2022-07-01 NOTE — Patient Instructions (Signed)
Good to see you. Wishing you all the best.  Reduce Diamox (acetazolamide) to 250mg : take 1 tablet every night for 2 weeks, then stop Diamox.  2. Have bloodwork done for potassium and eye exam as scheduled  3. Continue Topiramate 100mg  twice a day and Nortriptyline 50mg : take 2 capsules every night  4. Follow-up in 6 months, call for any changes

## 2022-07-01 NOTE — Progress Notes (Signed)
NEUROLOGY FOLLOW UP OFFICE NOTE  Janice Lucas [id number] 06-25-1965  HISTORY OF PRESENT ILLNESS: I had the pleasure of seeing Janice Lucas in follow-up in the neurology clinic on 07/01/2022.  The patient was last seen 9 months ago for pseudotumor cerebri, migraines,a nd episode of loss of consciousness with stiffening/urinary incontinence that occurred 06/2021. She was cold, clammy, diaphoretic, with no post-event confusion, suggestive of convulsive syncope. MRI brain and EEG normal. Since her last visit, she denies any further syncopal episodes since 06/2021. Headaches are overall controlled on current regimen, however 2 months ago when she started having a lot of stress going through divorce, she has had times she wakes up in the middle of the night with her head throbbing. When she wakes up in the morning, headaches are gone. She denies any headaches in the daytime. Vision is good, she is due for her eye exam. She reports potassium remains low. She is on Diamox 500mg  at bedtime. She is also on Topiramate 100mg  BID and Nortriptyline 100mg  at bedtime without side effects. She denies any dizziness. She still has occasional low-pitched tinnitus. 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 She has OSA but has not had her CPAP supplies for a month. She snores and sleeps 8 hours at night. She endorses daytime drowsiness.    History on Initial Assessment 02/08/2016: [name] with a history of pseudotumor cerebri diagnosed in 2015. Records from [facility] were reviewed, she presented to her eye doctor for a routine exam in April 2015 with a 50-month history of headaches thinking she may need new glasses. She was noted to have mild papilledema, right > left. At that time, headaches were behind her eyes and at the vertex, daily, worsened by bending, lifting, coughing, and sneezing. MRI and MRV brain were reported as normal. She had a lumbar puncture with opening  pressure of 25 in the lateral position. They report problems with her LP, she had a post-spinal tap headache that initial blood patch did not help with. She was started on Topamax and Diamox, and nortriptyline was added for migraine prevention. She was doing very well on this regimen with rare headaches. Her neurologist Dr. Craige Cotta left the practice and she saw Dr. Maple Hudson last October, with note of improvement in eye exam from last visit, no evidence of papilledema, and rare headaches. They discussed reducing some of her medications, Topamax was reduced to 100mg  daily. With reduction in Topamax, she noticed an increase in headaches, particularly in the morning. She then lost her insurance and was unable to obtain the Diamox. Without this, headaches became much worse. She describes them as diffuse sharp pain, with occasional photo and phonophobia when severe, no nausea/vomiting. Bending over causes floaters in her vision. She has intermittent tinnitus (non-pulsatile). She feels her vision is getting worse, she is straining more to use the computer with blurry vision, no loss of vision. No driving difficulties with peripheral vision.    She has been back on the Diamox after seeing her PCP, currently on uptitration taking 500mg  BID, then increasing back to original dose of 1000mg  BID. She had some paresthesias on the Diamox, and found that bananas help with this. She denies any dysarthria/dysphagia, neck/back pain, bowel/bladder dysfunction, dizziness, no falls. She reports some numbness in the three fingers of her right hand, they feel cold suddenly ("like ice cubes") and hurt. 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  PAST MEDICAL HISTORY: Past Medical History:  Diagnosis Date   Allergy    SEASONAL - MILD   GERD (gastroesophageal reflux disease)    ON OMEPRAZOLE   Goals of care, counseling/discussion 04/26/2021   Hyperlipidemia    ON MEDS   Hypertension    ON  MEDS   Rectal cancer (HCC) 04/26/2021   Sleep apnea    WEARS CPAP    MEDICATIONS: Current Outpatient Medications on File Prior to Visit  Medication Sig Dispense Refill   acetaminophen (TYLENOL) 500 MG tablet Take 500 mg by mouth every 6 (six) hours as needed.     acetaZOLAMIDE ER (DIAMOX) 500 MG capsule Take 1 capsule (500 mg total) by mouth every night 90 capsule 3   Ascorbic Acid (VITAMIN C) 1000 MG tablet Take 1,000 mg by mouth daily.     atorvastatin (LIPITOR) 20 MG tablet Take 1 tablet  by mouth daily. 90 tablet 3   Biotin 13086 MCG TABS Take by mouth daily.     Calcium Carb-Cholecalciferol (CALCIUM 1000 + D PO) Take by mouth 2 (two) times daily.     cholecalciferol (VITAMIN D3) 25 MCG (1000 UT) tablet Take 1,000 Units by mouth daily.     fluticasone (FLONASE) 50 MCG/ACT nasal spray Place 2 sprays into both nostrils daily. (Patient taking differently: Place 2 sprays into both nostrils as needed.) 16 g 6   HYDROcodone bit-homatropine (HYCODAN) 5-1.5 MG/5ML syrup Take 5 mLs by mouth every 6 (six) hours as needed for cough. 120 mL 0   lisinopril-hydrochlorothiazide (ZESTORETIC) 20-25 MG tablet Take 1 tablet by mouth daily. 90 tablet 0   nortriptyline (PAMELOR) 50 MG capsule Take 2 capsules (100 mg total) by mouth every night as directed 180 capsule 1   nortriptyline (PAMELOR) 50 MG capsule Take 2 capsules (100 mg total) by mouth at bedtime. 60 capsule 0   omeprazole (PRILOSEC) 40 MG capsule Take 1 capsule (40 mg total) by mouth daily. 90 capsule 3   potassium chloride SA (KLOR-CON M) 20 MEQ tablet Take 1 tablet (20 mEq total) by mouth 2 (two) times daily. 60 tablet 6   topiramate (TOPAMAX) 100 MG tablet Take 1 tablet  by mouth 2  times daily. 180 tablet 3   vitamin E 1000 UNIT capsule Take 1,000 Units by mouth daily.     [DISCONTINUED] prochlorperazine (COMPAZINE) 10 MG tablet Take 1 tablet (10 mg total) by mouth every 6 (six) hours as needed (Nausea or vomiting). 30 tablet 1   No current  facility-administered medications on file prior to visit.    ALLERGIES: Allergies  Allergen Reactions   Sulfamethoxazole Other (See Comments)   Neomycin-Bacitracin Zn-Polymyx Rash   Sulfa Antibiotics Other (See Comments)    Acts "goofy"    FAMILY HISTORY: Family History  Problem Relation Age of Onset   Prostate cancer Father        METS TO LYMPH NODES AND BLADDER   Diabetes Maternal Aunt    Diabetes Maternal Uncle    Colon cancer Neg Hx    Colon polyps Neg Hx    Esophageal cancer Neg Hx    Rectal cancer Neg Hx    Stomach cancer Neg Hx     SOCIAL HISTORY: Social History   Socioeconomic History   Marital status: Married    Spouse name: Not on file   Number of children: Not on file   Years of education: Not on file   Highest education level: Not on file  Occupational History  Not on file  Tobacco Use   Smoking status: Former    Packs/day: 1.00    Years: 20.00    Additional pack years: 0.00    Total pack years: 20.00    Types: Cigarettes   Smokeless tobacco: Current   Tobacco comments:    VAPES   Vaping Use   Vaping Use: Every day   Substances: Nicotine, Flavoring  Substance and Sexual Activity   Alcohol use: No    Comment: occassionally   Drug use: Not Currently   Sexual activity: Not Currently  Other Topics Concern   Not on file  Social History Narrative   Right Handed   Lives in a one story home   Drinks a big cup of coffee daily   Social Determinants of Health   Financial Resource Strain: Not on file  Food Insecurity: No Food Insecurity (12/19/2021)   Hunger Vital Sign    Worried About Running Out of Food in the Last Year: Never true    Ran Out of Food in the Last Year: Never true  Transportation Needs: No Transportation Needs (12/19/2021)   PRAPARE - Administrator, Civil Service (Medical): No    Lack of Transportation (Non-Medical): No  Physical Activity: Not on file  Stress: Not on file  Social Connections: Not on file  Intimate  Partner Violence: Not on file     PHYSICAL EXAM: Vitals:   07/01/22 0804  BP: 111/71  Pulse: 95  SpO2: 97%   General: No acute distress Head:  Normocephalic/atraumatic Skin/Extremities: No rash, no edema Neurological Exam: alert and awake. No aphasia or dysarthria. Fund of knowledge is appropriate. Attention and concentration are normal.   Cranial nerves: Pupils equal, round. Extraocular movements intact with no nystagmus. Visual fields full.  No facial asymmetry.  Motor: Bulk and tone normal, muscle strength 5/5 throughout with no pronator drift.   Finger to nose testing intact.  Gait narrow-based and steady, no ataxia.  Romberg negative.   IMPRESSION: [name] with a history of migraines and pseudotumor cerebri, who had an episode of loss of consciousness with body stiffening last 06/24/2021. MRI brain and EEG normal. Symptoms suggestive of syncope, less likely seizure. Headaches overall doing well on current regimen except recently in the setting of increased stress and inability to use her CPAP machine. Her potassium level has remained low. She does not have typical symptoms of IIH currently, we agreed to continue reducing Diamox to 250mg  at bedtime for 2 weeks, then stop. She is due for repeat bloodwork and repeat eye exam, monitor symptoms off Diamox. Continue Topiramate 100mg  BID and Nortriptyline 100mg  at bedtime for migraine prophylaxis. She was encouraged to resume CPAP use once she gets supplies. Follow-up in 6 months, call for any changes.    Thank you for allowing me to participate in her care.  Please do not hesitate to call for any questions or concerns.   Patrcia Dolly, M.D.   CC: Dr. Patsy Lager

## 2022-07-21 ENCOUNTER — Encounter: Payer: Self-pay | Admitting: Family Medicine

## 2022-07-21 ENCOUNTER — Encounter: Payer: Self-pay | Admitting: Hematology & Oncology

## 2022-07-21 NOTE — Telephone Encounter (Signed)
Please advise since you are DOD:

## 2022-07-26 ENCOUNTER — Other Ambulatory Visit: Payer: Self-pay | Admitting: Family Medicine

## 2022-07-26 ENCOUNTER — Other Ambulatory Visit: Payer: Self-pay | Admitting: Neurology

## 2022-07-26 DIAGNOSIS — I10 Essential (primary) hypertension: Secondary | ICD-10-CM

## 2022-07-28 ENCOUNTER — Other Ambulatory Visit (HOSPITAL_COMMUNITY): Payer: Self-pay

## 2022-07-28 ENCOUNTER — Other Ambulatory Visit: Payer: Self-pay

## 2022-07-28 MED ORDER — LISINOPRIL-HYDROCHLOROTHIAZIDE 20-25 MG PO TABS
1.0000 | ORAL_TABLET | Freq: Every day | ORAL | 0 refills | Status: DC
Start: 2022-07-28 — End: 2022-09-03
  Filled 2022-07-28: qty 90, 90d supply, fill #0

## 2022-07-30 ENCOUNTER — Other Ambulatory Visit (HOSPITAL_COMMUNITY): Payer: Self-pay

## 2022-08-06 ENCOUNTER — Other Ambulatory Visit: Payer: Self-pay

## 2022-08-06 ENCOUNTER — Ambulatory Visit (HOSPITAL_COMMUNITY)
Admission: RE | Admit: 2022-08-06 | Discharge: 2022-08-06 | Disposition: A | Discharge status: Home / Self Care | Payer: BC Managed Care – PPO | Source: Ambulatory Visit | Attending: Family | Admitting: Family

## 2022-08-06 ENCOUNTER — Other Ambulatory Visit (HOSPITAL_COMMUNITY): Payer: Self-pay

## 2022-08-06 DIAGNOSIS — C20 Malignant neoplasm of rectum: Secondary | ICD-10-CM | POA: Diagnosis not present

## 2022-08-06 MED ORDER — LORAZEPAM 1 MG PO TABS: 1.0000 mg | ORAL_TABLET | Freq: Once | ORAL | 0 refills | Status: DC

## 2022-08-06 MED ORDER — LORAZEPAM 1 MG PO TABS: 1.0000 mg | ORAL_TABLET | Freq: Once | ORAL | 0 refills | Status: AC

## 2022-08-06 MED ORDER — LORAZEPAM 1 MG PO TABS
1.0000 mg | ORAL_TABLET | Freq: Once | ORAL | 0 refills | Status: AC
  Filled 2022-08-06: qty 1, 1d supply, fill #0

## 2022-08-08 ENCOUNTER — Inpatient Hospital Stay: Payer: BC Managed Care – PPO

## 2022-08-08 ENCOUNTER — Inpatient Hospital Stay: Payer: BC Managed Care – PPO | Admitting: Hematology & Oncology

## 2022-08-14 ENCOUNTER — Other Ambulatory Visit: Payer: Self-pay

## 2022-08-14 ENCOUNTER — Inpatient Hospital Stay: Payer: BC Managed Care – PPO | Attending: Hematology & Oncology

## 2022-08-14 ENCOUNTER — Inpatient Hospital Stay (HOSPITAL_BASED_OUTPATIENT_CLINIC_OR_DEPARTMENT_OTHER): Payer: BC Managed Care – PPO | Admitting: Hematology & Oncology

## 2022-08-14 ENCOUNTER — Inpatient Hospital Stay: Payer: BC Managed Care – PPO

## 2022-08-14 ENCOUNTER — Encounter: Payer: Self-pay | Admitting: Hematology & Oncology

## 2022-08-14 VITALS — BP 117/76 | HR 99 | Temp 98.7°F | Resp 18 | Ht 67.0 in | Wt 287.0 lb

## 2022-08-14 DIAGNOSIS — C20 Malignant neoplasm of rectum: Secondary | ICD-10-CM

## 2022-08-14 DIAGNOSIS — Z7901 Long term (current) use of anticoagulants: Secondary | ICD-10-CM | POA: Insufficient documentation

## 2022-08-14 DIAGNOSIS — Z85048 Personal history of other malignant neoplasm of rectum, rectosigmoid junction, and anus: Secondary | ICD-10-CM | POA: Diagnosis not present

## 2022-08-14 DIAGNOSIS — Z95828 Presence of other vascular implants and grafts: Secondary | ICD-10-CM

## 2022-08-14 DIAGNOSIS — Z452 Encounter for adjustment and management of vascular access device: Secondary | ICD-10-CM | POA: Diagnosis not present

## 2022-08-14 LAB — CBC WITH DIFFERENTIAL (CANCER CENTER ONLY)
Abs Immature Granulocytes: 0.02 10*3/uL (ref 0.00–0.07)
Basophils Absolute: 0 10*3/uL (ref 0.0–0.1)
Basophils Relative: 0 %
Eosinophils Absolute: 0.3 10*3/uL (ref 0.0–0.5)
Eosinophils Relative: 3 %
HCT: 41.1 % (ref 36.0–46.0)
Hemoglobin: 13.2 g/dL (ref 12.0–15.0)
Immature Granulocytes: 0 %
Lymphocytes Relative: 23 %
Lymphs Abs: 1.9 10*3/uL (ref 0.7–4.0)
MCH: 27.9 pg (ref 26.0–34.0)
MCHC: 32.1 g/dL (ref 30.0–36.0)
MCV: 86.9 fL (ref 80.0–100.0)
Monocytes Absolute: 0.6 10*3/uL (ref 0.1–1.0)
Monocytes Relative: 7 %
Neutro Abs: 5.5 10*3/uL (ref 1.7–7.7)
Neutrophils Relative %: 67 %
Platelet Count: 308 10*3/uL (ref 150–400)
RBC: 4.73 MIL/uL (ref 3.87–5.11)
RDW: 14.2 % (ref 11.5–15.5)
WBC Count: 8.3 10*3/uL (ref 4.0–10.5)
nRBC: 0 % (ref 0.0–0.2)

## 2022-08-14 LAB — CMP (CANCER CENTER ONLY)
ALT: 13 U/L (ref 0–44)
AST: 14 U/L — ABNORMAL LOW (ref 15–41)
Albumin: 4.3 g/dL (ref 3.5–5.0)
Alkaline Phosphatase: 88 U/L (ref 38–126)
Anion gap: 11 (ref 5–15)
BUN: 16 mg/dL (ref 6–20)
CO2: 25 mmol/L (ref 22–32)
Calcium: 8.9 mg/dL (ref 8.9–10.3)
Chloride: 103 mmol/L (ref 98–111)
Creatinine: 0.88 mg/dL (ref 0.44–1.00)
GFR, Estimated: >60 mL/min (ref >60)
Glucose, Bld: 154 mg/dL — ABNORMAL HIGH (ref 70–99)
Potassium: 3.2 mmol/L — ABNORMAL LOW (ref 3.5–5.1)
Sodium: 139 mmol/L (ref 135–145)
Total Bilirubin: 0.4 mg/dL (ref 0.3–1.2)
Total Protein: 7.9 g/dL (ref 6.5–8.1)

## 2022-08-14 LAB — LACTATE DEHYDROGENASE: LDH: 200 U/L — ABNORMAL HIGH (ref 98–192)

## 2022-08-14 LAB — CEA (ACCESS): CEA (CHCC): 2.81 ng/mL (ref 0.00–5.00)

## 2022-08-14 MED ORDER — HEPARIN SOD (PORK) LOCK FLUSH 100 UNIT/ML IV SOLN
500.0000 [IU] | Freq: Once | INTRAVENOUS | Status: AC
  Administered 2022-08-14: 500 [IU] via INTRAVENOUS

## 2022-08-14 MED ORDER — SODIUM CHLORIDE 0.9% FLUSH
10.0000 mL | INTRAVENOUS | Status: DC | PRN
  Administered 2022-08-14: 10 mL via INTRAVENOUS

## 2022-08-14 NOTE — Progress Notes (Signed)
Hematology and Oncology Follow Up Visit  Janice Lucas [id number] 1965/03/04 57 y.o. 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  She comes in with her mom.  She is doing quite well.  She is working.  She is not taking any medication because she works all the time.  She did have an MRI of the pelvis last week.  Unfortunately, the results were not back.  She has had no problems with bowels or bladder.  She has had no bleeding.  There is been no pain.  She has had no nausea or vomiting.  There is been no cough or shortness of breath.  Her last CEA level was 2.4.  She is eating well.  She has had no problems with COVID.  Overall, I would say that her performance status is probably ECOG 0.    Medications:  Allergies as of 08/14/2022       Reactions   Sulfamethoxazole Other (See Comments)   Makes me loopy.   Neomycin-bacitracin Zn-polymyx Rash   Sulfa Antibiotics Other (See Comments)   Acts "goofy"        Medication List        Accurate as of August 14, 2022  3:01 PM. If you have any questions, ask your nurse or doctor.          STOP taking these medications    acetaZOLAMIDE 250 MG tablet Commonly known as: DIAMOX Stopped by: Janice Lucas   HYDROcodone bit-homatropine 5-1.5 MG/5ML syrup Commonly known as: Hycodan Stopped by: Janice Lucas       TAKE these medications    acetaminophen 500 MG tablet Commonly known as: TYLENOL Take 500 mg by mouth every 6 (six) hours as needed.   atorvastatin 20 MG tablet Commonly known as: LIPITOR Take 1 tablet  by mouth daily.    Biotin 40981 MCG Tabs Take by mouth daily.   CALCIUM 1000 + D PO Take by mouth 2 (two) times daily.   cholecalciferol 25 MCG (1000 UNIT) tablet Commonly known as: VITAMIN D3 Take 1,000 Units by mouth daily.   fluticasone 50 MCG/ACT nasal spray Commonly known as: FLONASE Place 2 sprays into both nostrils daily.   lisinopril-hydrochlorothiazide 20-25 MG tablet Commonly known as: ZESTORETIC Take 1 tablet by mouth daily.   nortriptyline 50 MG capsule Commonly known as: PAMELOR Take 2 capsules (100 mg total) by mouth every night as directed   omeprazole 40 MG capsule Commonly known as: PRILOSEC Take 1 capsule (40 mg total) by mouth daily.   potassium chloride SA 20 MEQ tablet Commonly known as: KLOR-CON M Take 1 tablet (20 mEq total) by mouth 2 (two) times daily.   topiramate 100 MG tablet Commonly known as: TOPAMAX Take 1 tablet by mouth 2  times daily.   vitamin C 1000 MG tablet Take 1,000 mg by mouth daily.   vitamin E 1000 UNIT capsule Take 1,000 Units by mouth daily.        Allergies:  Allergies  Allergen Reactions   Sulfamethoxazole Other (See Comments)    Makes me loopy.   Neomycin-Bacitracin  Zn-Polymyx Rash   Sulfa Antibiotics Other (See Comments)    Acts "goofy"    Past Medical History, Surgical history, Social history, and Family History were reviewed and updated.  Review of Systems: Review of Systems  Constitutional: Negative.   HENT: Negative.    Eyes: Negative.   Respiratory: Negative.    Cardiovascular: Negative.   Gastrointestinal: Negative.   Genitourinary: Negative.   Musculoskeletal: Negative.   Skin: Negative.   Neurological: Negative.   Endo/Heme/Allergies: Negative.   Psychiatric/Behavioral: Negative.       Physical Exam:  height is 5\' 7"  (1.702 m) and weight is 287 lb (130.2 kg). Her oral temperature is 98.7 F (37.1 C). Her blood pressure is 117/76 and her pulse is 99. Her respiration is 18 and oxygen saturation is 97%.    Wt Readings from Last 3 Encounters:  08/14/22 287 lb (130.2 kg)  07/01/22 287 lb 6.4 oz (130.4 kg)  04/17/22 290 lb (131.5 kg)   Physical Exam Vitals reviewed.  HENT:     Head: Normocephalic and atraumatic.  Eyes:     Pupils: Pupils are equal, round, and reactive to light.  Cardiovascular:     Rate and Rhythm: Normal rate and regular rhythm.     Heart sounds: Normal heart sounds.  Pulmonary:     Effort: Pulmonary effort is normal.     Breath sounds: Normal breath sounds.  Abdominal:     General: Bowel sounds are normal.     Palpations: Abdomen is soft.  Musculoskeletal:        General: No tenderness or deformity. Normal range of motion.     Cervical back: Normal range of motion.  Lymphadenopathy:     Cervical: No cervical adenopathy.  Skin:    General: Skin is warm and dry.     Findings: No erythema or rash.  Neurological:     Mental Status: She is alert and oriented to person, place, and time.  Psychiatric:        Behavior: Behavior normal.        Thought Content: Thought content normal.        Judgment: Judgment normal.       Lab Results  Component Value Date   WBC 8.3 08/14/2022   HGB 13.2 08/14/2022   HCT 41.1 08/14/2022   MCV 86.9 08/14/2022   PLT 308 08/14/2022   Lab Results  Component Value Date   FERRITIN 99 06/17/2021   IRON 48 06/17/2021   TIBC 311 06/17/2021   UIBC 263 06/17/2021   IRONPCTSAT 15 06/17/2021   Lab Results  Component Value Date   RBC 4.73 08/14/2022   No results found for: "KPAFRELGTCHN", "LAMBDASER", "KAPLAMBRATIO" No results found for: "IGGSERUM", "IGA", "IGMSERUM" No results found for: "TOTALPROTELP", "ALBUMINELP", "A1GS", "A2GS", "BETS", "BETA2SER", "GAMS", "MSPIKE", "SPEI"   Chemistry      Component Value Date/Time   NA 139 08/14/2022 1358   K 3.2 (L) 08/14/2022 1358   CL 103 08/14/2022 1358   CO2 25 08/14/2022 1358   BUN 16 08/14/2022 1358   CREATININE 0.88 08/14/2022 1358   CREATININE 0.84 11/15/2019 1516       Component Value Date/Time   CALCIUM 8.9 08/14/2022 1358   ALKPHOS 88 08/14/2022 1358   AST 14 (L) 08/14/2022 1358   ALT 13 08/14/2022 1358   BILITOT 0.4 08/14/2022 1358       Impression and Plan: Janice Lucas is a very charming 57 yo white female with localized rectal cancer, stage IIA.  We tried her on neoadjuvant chemotherapy which she tolerated incredibly poorly.  As such, we finally did get her to surgery.  She had no residual disease at surgery.  For right now, I think that we probably do not need another MRI probably until the end of the year.  I think Dr. Byrd Hesselbach wants her to have a colonoscopy in September.  We will set her up for this.  Dr. Chales Abrahams did her last colonoscopy.  She sees Dr. Byrd Hesselbach in October.  Her potassium is a little bit better.  She will stay on her potassium at home.  I will plan to see her back in November.   Janice Macho, MD 8/1/20243:01 PM

## 2022-08-15 ENCOUNTER — Telehealth: Payer: Self-pay | Admitting: *Deleted

## 2022-08-15 NOTE — Telephone Encounter (Signed)
Call placed to patient to notify her per order of Dr. Myna Hidalgo that the MRI shows no cancer, but does show inflammation in her uterus and to contact her GYN regarding inflammation.  Pt states that she does not have a GYN and will contact Dr. Patsy Lager for a referral for GYN.  Pt is appreciative of call and has no questions at this time.

## 2022-08-18 ENCOUNTER — Encounter: Payer: Self-pay | Admitting: Family Medicine

## 2022-08-18 DIAGNOSIS — Q5039 Other congenital malformation of ovary: Secondary | ICD-10-CM

## 2022-08-18 NOTE — Telephone Encounter (Signed)
Called her back- she had an MRI week before last to followup her rectal cancer- oncology recommended a GYN consult  MRI pelvis 08/06/22: IMPRESSION: 1. No evidence of residual or recurrent rectal mass. No pelvic adenopathy. 2. Diffuse T2 hypointensity in the uterine fundus is suspicious for adenomyosis. 3. Small T2 hypointense lesions within both ovaries have been present back to at least August of 2023. Considerations include endometriomas orthecoma/fibromas. If imaging follow-up is not otherwise performed, ultrasound at 6 months should be considered. -------------------------------------------------- She would like to be seen by a female GYN, needs a referral   She is also due for an appt- scheduled for later this month to see me  History of pre-diabetes, may have advanced to DM

## 2022-08-20 ENCOUNTER — Encounter: Payer: Self-pay | Admitting: Family Medicine

## 2022-08-29 NOTE — Progress Notes (Unsigned)
[facility] [address] [place], [place] [address] [phone]  Date:  09/03/2022   Name:  Janice Lucas   DOB:  12/02/1965   MRN:  [id number]  PCP:  Janice Cables, MD    Chief Complaint: No chief complaint on file.   History of Present Illness:  Janice Lucas is a 57 y.o. very pleasant female patient who presents with the following:  Patient seen today for follow-up, blood sugar evaluation Most recent visit with myself was in July Pt with history of HTN, migraine HA, hyperlipidemia, intracranial hypertension/ pseudotumor cerebri dx in 2015, mild diabetes.  She had a colonoscopy in March 2023 and was diagnosed with localized rectal cancer   At her last visit she had also recently suffered a possible seizure and was being evaluated by neurology-her mother was driving her so she could work  Most recent visit with her oncologist, Dr. 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  We tried her on neoadjuvant chemotherapy which she tolerated incredibly poorly.  As such, we finally did get her to surgery.  She had no residual disease at surgery. For right now, I think that we probably do not need another MRI probably until the end of the year.   She was seen by neurology, Janice Lucas in June: [name] with a history of migraines and pseudotumor cerebri, who had an episode of loss of consciousness with body stiffening last 06/24/2021. MRI brain and EEG normal.  Symptoms suggestive of syncope, less likely seizure. Headaches overall doing well on current regimen except recently in the setting of increased stress and inability to use her CPAP machine. Her potassium level has remained low. She does not have typical symptoms of IIH currently, we agreed to continue reducing Diamox to 250mg  at bedtime for 2 weeks, then stop. She is due for repeat bloodwork and repeat eye exam, monitor symptoms off Diamox. Continue Topiramate 100mg  BID and Nortriptyline 100mg  at bedtime for migraine prophylaxis. She was encouraged to resume CPAP use once she gets supplies. Follow-up in 6 months, call for any changes.   Lab Results  Component Value Date   HGBA1C 6.8 (H) 01/01/2022    Patient Active Problem List   Diagnosis Date Noted   Insufficiency fracture of medial femoral condyle (HCC) 04/17/2022   Degenerative tear of left medial meniscus 04/17/2022   Estrogen deficiency 04/17/2022   Rectal cancer (HCC) 04/26/2021   Goals of care, counseling/discussion 04/26/2021   Allergic rhinitis 05/15/2020   Hypertension 07/05/2018   Migraine without aura and without status migrainosus, not intractable 02/08/2016   Depression 10/10/2015   Injury by electrocution 10/10/2015   Mixed hyperlipidemia 10/10/2015   Prediabetes 10/10/2015   Urinary incontinence 10/10/2015   Sleep apnea 08/30/2015   IIH (idiopathic intracranial hypertension) 11/14/2013   Herniated nucleus pulposus, C6-7 right 02/14/2011    Past Medical History:  Diagnosis Date   Allergy    SEASONAL - MILD   GERD (gastroesophageal  reflux disease)    ON OMEPRAZOLE   Goals of care, counseling/discussion 04/26/2021   Hyperlipidemia    ON MEDS   Hypertension    ON MEDS   Rectal cancer (HCC) 04/26/2021   Sleep apnea    WEARS CPAP    Past Surgical History:  Procedure Laterality Date   ANTERIOR CERVICAL DECOMP/DISCECTOMY FUSION  02/14/2011   Procedure: ANTERIOR CERVICAL DECOMPRESSION/DISCECTOMY FUSION 1 LEVEL;   Surgeon: Stefani Dama, MD;  Location: [facility];  Service: Neurosurgery;  Laterality: N/A;  Anterior Cervical Six-Seven Decompression and Fusion   COLON SURGERY  09/2021   COLON SURGERY  12/2021   IR IMAGING GUIDED PORT INSERTION  05/02/2021    Social History   Tobacco Use   Smoking status: Former    Current packs/day: 1.00    Average packs/day: 1 pack/day for 20.0 years (20.0 ttl pk-yrs)    Types: Cigarettes   Smokeless tobacco: Current   Tobacco comments:    VAPES   Vaping Use   Vaping status: Every Day   Substances: Nicotine, Flavoring  Substance Use Topics   Alcohol use: No    Comment: occassionally   Drug use: Not Currently    Family History  Problem Relation Age of Onset   Prostate cancer Father        METS TO LYMPH NODES AND BLADDER   Diabetes Maternal Aunt    Diabetes Maternal Uncle    Colon cancer Neg Hx    Colon polyps Neg Hx    Esophageal cancer Neg Hx    Rectal cancer Neg Hx    Stomach cancer Neg Hx     Allergies  Allergen Reactions   Sulfamethoxazole Other (See Comments)    Makes me loopy.   Neomycin-Bacitracin Zn-Polymyx Rash   Sulfa Antibiotics Other (See Comments)    Acts "goofy"    Medication list has been reviewed and updated.  Current Outpatient Medications on File Prior to Visit  Medication Sig Dispense Refill   acetaminophen (TYLENOL) 500 MG tablet Take 500 mg by mouth every 6 (six) hours as needed.     Ascorbic Acid (VITAMIN C) 1000 MG tablet Take 1,000 mg by mouth daily.     atorvastatin (LIPITOR) 20 MG tablet Take 1 tablet  by mouth daily. 90 tablet 3   Biotin 21308 MCG TABS Take by mouth daily.     Calcium Carb-Cholecalciferol (CALCIUM 1000 + D PO) Take by mouth 2 (two) times daily.     cholecalciferol (VITAMIN D3) 25 MCG (1000 UT) tablet Take 1,000 Units by mouth daily.     fluticasone (FLONASE) 50 MCG/ACT nasal spray Place 2 sprays into both nostrils daily. (Patient not taking: Reported on 08/14/2022) 16 g 6    lisinopril-hydrochlorothiazide (ZESTORETIC) 20-25 MG tablet Take 1 tablet by mouth daily. 90 tablet 0   nortriptyline (PAMELOR) 50 MG capsule Take 2 capsules (100 mg total) by mouth every night as directed 180 capsule 3   omeprazole (PRILOSEC) 40 MG capsule Take 1 capsule (40 mg total) by mouth daily. 90 capsule 3   potassium chloride SA (KLOR-CON M) 20 MEQ tablet Take 1 tablet (20 mEq total) by mouth 2 (two) times daily. 60 tablet 6   topiramate (TOPAMAX) 100 MG tablet Take 1 tablet by mouth 2  times daily. 180 tablet 3   vitamin E 1000 UNIT capsule Take 1,000 Units by mouth daily.     [DISCONTINUED] prochlorperazine (COMPAZINE) 10 MG tablet Take 1 tablet (10 mg total) by mouth every  6 (six) hours as needed (Nausea or vomiting). 30 tablet 1   No current facility-administered medications on file prior to visit.    Review of Systems:  As per HPI- otherwise negative.   Physical Examination: There were no vitals filed for this visit. There were no vitals filed for this visit. There is no height or weight on file to calculate BMI. Ideal Body Weight:    GEN: no acute distress. HEENT: Atraumatic, Normocephalic.  Ears and Nose: No external deformity. CV: RRR, No M/G/R. No JVD. No thrill. No extra heart sounds. PULM: CTA B, no wheezes, crackles, rhonchi. No retractions. No resp. distress. No accessory muscle use. ABD: S, NT, ND, +BS. No rebound. No HSM. EXTR: No c/c/e PSYCH: Normally interactive. Conversant.    Assessment and Plan: ***  Signed Abbe Amsterdam, MD

## 2022-09-03 ENCOUNTER — Other Ambulatory Visit (HOSPITAL_COMMUNITY): Payer: Self-pay

## 2022-09-03 ENCOUNTER — Other Ambulatory Visit (HOSPITAL_COMMUNITY)
Admission: RE | Admit: 2022-09-03 | Discharge: 2022-09-03 | Disposition: A | Discharge status: Home / Self Care | Payer: BC Managed Care – PPO | Source: Ambulatory Visit | Attending: Family Medicine | Admitting: Family Medicine

## 2022-09-03 ENCOUNTER — Ambulatory Visit (INDEPENDENT_AMBULATORY_CARE_PROVIDER_SITE_OTHER): Payer: BC Managed Care – PPO | Admitting: Family Medicine

## 2022-09-03 VITALS — BP 122/80 | HR 90 | Temp 98.1°F | Resp 18 | Ht 67.0 in | Wt 290.2 lb

## 2022-09-03 DIAGNOSIS — E118 Type 2 diabetes mellitus with unspecified complications: Secondary | ICD-10-CM

## 2022-09-03 DIAGNOSIS — I10 Essential (primary) hypertension: Secondary | ICD-10-CM

## 2022-09-03 DIAGNOSIS — Z124 Encounter for screening for malignant neoplasm of cervix: Secondary | ICD-10-CM | POA: Diagnosis not present

## 2022-09-03 DIAGNOSIS — E876 Hypokalemia: Secondary | ICD-10-CM

## 2022-09-03 DIAGNOSIS — N8003 Adenomyosis of the uterus: Secondary | ICD-10-CM

## 2022-09-03 MED ORDER — HYDROCHLOROTHIAZIDE 25 MG PO TABS
25.0000 mg | ORAL_TABLET | Freq: Every day | ORAL | 3 refills | Status: DC
Start: 2022-09-03 — End: 2022-10-21
  Filled 2022-09-03: qty 90, 90d supply, fill #0

## 2022-09-03 MED ORDER — LOSARTAN POTASSIUM 50 MG PO TABS
50.0000 mg | ORAL_TABLET | Freq: Every day | ORAL | 3 refills | Status: DC
Start: 2022-09-03 — End: 2023-08-20
  Filled 2022-09-03: qty 90, 90d supply, fill #0
  Filled 2022-11-28: qty 90, 90d supply, fill #1
  Filled 2023-03-08: qty 90, 90d supply, fill #2
  Filled 2023-06-07: qty 90, 90d supply, fill #3

## 2022-09-03 NOTE — Patient Instructions (Addendum)
Good to see you today- I will be in touch with your labs and your pap It is true that lisinopril could be causing the cough- let's try stopping your lisionpril hydrochlorothiazide and change over to losartan 50 mg and hydrochlorothiazide 25 mg daily in separate pills Please let me know how your cough responds   Recommend flu and covid booster this fall

## 2022-09-04 ENCOUNTER — Other Ambulatory Visit: Payer: Self-pay | Admitting: Family Medicine

## 2022-09-04 ENCOUNTER — Encounter: Payer: Self-pay | Admitting: Family Medicine

## 2022-09-04 DIAGNOSIS — K219 Gastro-esophageal reflux disease without esophagitis: Secondary | ICD-10-CM

## 2022-09-04 DIAGNOSIS — E118 Type 2 diabetes mellitus with unspecified complications: Secondary | ICD-10-CM

## 2022-09-04 LAB — BASIC METABOLIC PANEL
BUN: 16 mg/dL (ref 6–23)
CO2: 28 meq/L (ref 19–32)
Calcium: 8.8 mg/dL (ref 8.4–10.5)
Chloride: 102 mEq/L (ref 96–112)
Creatinine, Ser: 0.86 mg/dL (ref 0.40–1.20)
GFR: 75.29 mL/min (ref >60.00)
Glucose, Bld: 117 mg/dL — ABNORMAL HIGH (ref 70–99)
Potassium: 3.6 meq/L (ref 3.5–5.1)
Sodium: 139 meq/L (ref 135–145)

## 2022-09-04 LAB — HEMOGLOBIN A1C: Hgb A1c MFr Bld: 7 % — ABNORMAL HIGH (ref 4.6–6.5)

## 2022-09-04 LAB — MICROALBUMIN / CREATININE URINE RATIO
Creatinine,U: 152.9 mg/dL
Microalb Creat Ratio: 0.5 mg/g (ref 0.0–30.0)
Microalb, Ur: <0.7 mg/dL (ref 0.0–1.9)

## 2022-09-05 ENCOUNTER — Other Ambulatory Visit (HOSPITAL_COMMUNITY): Payer: Self-pay

## 2022-09-05 ENCOUNTER — Other Ambulatory Visit: Payer: Self-pay

## 2022-09-05 ENCOUNTER — Encounter: Payer: Self-pay | Admitting: Family Medicine

## 2022-09-05 LAB — CYTOLOGY - PAP
Adequacy: ABSENT
Comment: NEGATIVE
Diagnosis: NEGATIVE
High risk HPV: NEGATIVE

## 2022-09-05 MED ORDER — OMEPRAZOLE 40 MG PO CPDR
40.0000 mg | DELAYED_RELEASE_CAPSULE | Freq: Every day | ORAL | 1 refills | Status: DC
Start: 2022-09-05 — End: 2023-03-22
  Filled 2022-09-05: qty 90, 90d supply, fill #0
  Filled 2022-12-28: qty 90, 90d supply, fill #1

## 2022-09-05 MED ORDER — METFORMIN HCL 500 MG PO TABS
500.0000 mg | ORAL_TABLET | Freq: Every day | ORAL | 3 refills | Status: DC
  Filled 2022-09-05: qty 90, 90d supply, fill #0
  Filled 2023-02-06: qty 90, 90d supply, fill #1
  Filled 2023-05-06: qty 90, 90d supply, fill #2
  Filled 2023-08-03: qty 90, 90d supply, fill #3

## 2022-09-09 DIAGNOSIS — H40053 Ocular hypertension, bilateral: Secondary | ICD-10-CM | POA: Diagnosis not present

## 2022-09-09 DIAGNOSIS — H05113 Granuloma of bilateral orbits: Secondary | ICD-10-CM | POA: Diagnosis not present

## 2022-09-11 ENCOUNTER — Telehealth: Payer: Self-pay

## 2022-09-11 ENCOUNTER — Encounter: Payer: Self-pay | Admitting: Gastroenterology

## 2022-09-11 NOTE — Telephone Encounter (Signed)
LVM for patient to be set up for a colonoscopy and previsit late September/ October with Dr Chales Abrahams per Dr Myna Hidalgo

## 2022-09-11 NOTE — Telephone Encounter (Signed)
Patient has been scheduled for next available colonoscopy 10/29 and 10/8 pre visit.

## 2022-09-11 NOTE — Telephone Encounter (Signed)
Thank you :)

## 2022-09-23 NOTE — Progress Notes (Unsigned)
[facility] at [facility] [address], [address] [place], [place] [address] [phone]  Date:  09/24/2022   Name:  Janice Lucas   DOB:  [birth year]   MRN:  [id number]  PCP:  Janice Cables, MD    Chief Complaint: knee issue (Right knee and leg started last week. No prior injured. Taking tylenol, some swelling. /(Due: Tdap, Flu, colonoscopy))   History of Present Illness:  Janice Lucas is a 57 y.o. very pleasant female patient who presents with the following:  Patient here today with concern of a tender bump below her right knee, over the medial aspect of the proximal tibia Most recent visit with myself was in August Pt with history of HTN, migraine HA, hyperlipidemia, intracranial hypertension/ pseudotumor cerebri dx in 2015, mild diabetes.  She had a colonoscopy in March 2023 and was diagnosed with localized rectal cancer   She saw her oncologist Dr. 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 is a very charming 57 yo white female with localized rectal cancer, stage IIA.  We tried her on neoadjuvant chemotherapy which she tolerated incredibly poorly.  As such, we finally did get her to surgery.  She had no residual disease at surgery. For right now, I think that we probably do not need another MRI probably until the end of the year.  She noted onset of pain in her right knee last week- she is not sure what day exactly She is not aware of any injury or other cause of this pain It hurts mostly if the area is pressed on.  She is able to walk okay She otherwise feels well, no  fever or other systemic symptoms  Patient Active Problem List   Diagnosis Date Noted   Insufficiency fracture of medial femoral condyle (HCC) 04/17/2022   Degenerative tear of left medial meniscus 04/17/2022   Estrogen deficiency 04/17/2022   Rectal cancer (HCC) 04/26/2021   Goals of care, counseling/discussion 04/26/2021   Allergic rhinitis 05/15/2020   Hypertension 07/05/2018   Migraine without aura and without status migrainosus, not intractable 02/08/2016   Depression 10/10/2015   Injury by electrocution 10/10/2015   Mixed hyperlipidemia 10/10/2015   Prediabetes 10/10/2015   Urinary incontinence 10/10/2015   Sleep apnea 08/30/2015   IIH (idiopathic intracranial hypertension) 11/14/2013   Herniated nucleus pulposus, C6-7 right 02/14/2011    Past Medical History:  Diagnosis Date   Allergy    SEASONAL - MILD   GERD (gastroesophageal reflux disease)    ON OMEPRAZOLE   Goals of care, counseling/discussion 04/26/2021   Hyperlipidemia    ON MEDS   Hypertension    ON MEDS   Rectal cancer (HCC) 04/26/2021   Sleep apnea    WEARS CPAP    Past Surgical History:  Procedure Laterality Date   ANTERIOR CERVICAL DECOMP/DISCECTOMY FUSION  02/14/2011   Procedure: ANTERIOR CERVICAL DECOMPRESSION/DISCECTOMY FUSION 1 LEVEL;  Surgeon: Stefani Dama, MD;  Location: [facility];  Service: Neurosurgery;  Laterality: N/A;  Anterior Cervical Six-Seven Decompression and Fusion   COLON SURGERY  09/2021   COLON SURGERY  12/2021   IR IMAGING GUIDED PORT INSERTION  05/02/2021    Social History   Tobacco Use   Smoking status: Former    Current packs/day: 1.00    Average packs/day: 1 pack/day for 20.0 years (20.0 ttl pk-yrs)    Types: Cigarettes   Smokeless tobacco: Current   Tobacco comments:    VAPES   Vaping Use   Vaping status: Every Day   Substances: Nicotine, Flavoring  Substance Use Topics   Alcohol use: No    Comment: occassionally   Drug use: Not Currently    Family  History  Problem Relation Age of Onset   Prostate cancer Father        METS TO LYMPH NODES AND BLADDER   Diabetes Maternal Aunt    Diabetes Maternal Uncle    Colon cancer Neg Hx    Colon polyps Neg Hx    Esophageal cancer Neg Hx    Rectal cancer Neg Hx    Stomach cancer Neg Hx     Allergies  Allergen Reactions   Sulfamethoxazole Other (See Comments)    Makes me loopy.   Neomycin-Bacitracin Zn-Polymyx Rash   Sulfa Antibiotics Other (See Comments)    Acts "goofy"    Medication list has been reviewed and updated.  Current Outpatient Medications on File Prior to Visit  Medication Sig Dispense Refill   acetaminophen (TYLENOL) 500 MG tablet Take 500 mg by mouth every 6 (six) hours as needed.     Ascorbic Acid (VITAMIN C) 1000 MG tablet Take 1,000 mg by mouth daily.     atorvastatin (LIPITOR) 20 MG tablet Take 1 tablet  by mouth daily. 90 tablet 3   Biotin [address] MCG TABS Take by mouth daily.     Calcium Carb-Cholecalciferol (CALCIUM 1000 + D PO) Take by mouth 2 (two) times daily.     cholecalciferol (VITAMIN D3) 25 MCG (1000 UT) tablet Take 1,000 Units by mouth daily.     fluticasone (FLONASE) 50 MCG/ACT nasal spray Place 2 sprays into both nostrils daily. 16 g 6   hydrochlorothiazide (HYDRODIURIL) 25 MG tablet Take 1 tablet (25 mg total) by mouth daily. 90 tablet 3   losartan (COZAAR) 50 MG tablet Take 1 tablet (50 mg total) by mouth daily. 90 tablet 3   metFORMIN (GLUCOPHAGE) 500 MG tablet Take 1 tablet (500 mg total) by mouth daily with breakfast. 90 tablet 3   nortriptyline (PAMELOR) 50 MG capsule Take 2 capsules (100 mg total) by mouth every night as directed 180 capsule 3   omeprazole (PRILOSEC) 40 MG capsule Take 1 capsule (40 mg total) by mouth daily. 90 capsule 1   potassium chloride SA (KLOR-CON M) 20 MEQ tablet Take 1 tablet (20 mEq total) by mouth 2 (two) times daily. 60 tablet 6   topiramate (TOPAMAX) 100 MG tablet Take 1 tablet by mouth 2  times daily. 180 tablet 3    vitamin E 1000 UNIT capsule Take 1,000 Units by mouth daily.     [DISCONTINUED] prochlorperazine (COMPAZINE) 10 MG tablet Take 1 tablet (10 mg total) by mouth every 6 (six) hours as needed (Nausea or vomiting). 30 tablet 1   No current facility-administered medications on file prior to visit.    Review of Systems:  As per HPI- otherwise negative.   Physical Examination: Vitals:   09/24/22 1438  BP: 124/80  Pulse: 95  Resp: 18  Temp: 97.6 F (36.4 C)  SpO2: 95%   Vitals:   09/24/22 1438  Weight:  291 lb 9.6 oz (132.3 kg)  Height: 5\' 7"  (1.702 m)   Body mass index is 45.67 kg/m. Ideal Body Weight: Weight in (lb) to have BMI = 25: 159.3  GEN: no acute distress.  Obese, looks well HEENT: Atraumatic, Normocephalic.  Ears and Nose: No external deformity. CV: RRR, No M/G/R. No JVD. No thrill. No extra heart sounds. PULM: CTA B, no wheezes, crackles, rhonchi. No retractions. No resp. distress. No accessory muscle use. EXTR: No c/c/e PSYCH: Normally interactive. Conversant.  Right knee shows normal range of motion, joint is stable.  No significant effusion is noted, no redness or heat of the joint.  Just inferior to the knee joint over the proximal tibia there is a somewhat firm palpable mass, and photo below the area in question is just medial to the patient's 2 fingers  The calf itself seems normal, there is some generalized tenderness of the lower leg but most severe over the particular spot indicated normal pulses right foot -no edema or other foot abnormality    Assessment and Plan: Right leg pain - Plan: DG Tibia/Fibula Right, DG Knee 1-2 Views Right, US Venous Img Lower Unilateral Right  Patient seen with a firm, tender area over her right proximal tibia as seen above.  She has known cancer, we will evaluate for any potential bone lesion or blood clot ASAP  Ordered plain films, ultrasound-will be in touch with her pending these results  Signed Abbe Amsterdam,  MD  Received ultrasound and x-ray reports, message to patient  DG Knee 1-2 Views Right  Result Date: 09/24/2022 CLINICAL DATA:  Knee pain. Palpable nodule of the proximal anterior tibia. EXAM: RIGHT KNEE - 1-2 VIEW COMPARISON:  04/17/2022 FINDINGS: No visible joint effusion. Mild medial compartment joint space narrowing. Small osteophytes at the patellofemoral joint. These findings are consistent with mild age related degenerative change. No focal or traumatic finding otherwise. IMPRESSION: Mild age related degenerative changes. No acute or traumatic finding. Electronically Signed   By: Paulina Fusi M.D.   On: 09/24/2022 18:18   DG Tibia/Fibula Right  Result Date: 09/24/2022 CLINICAL DATA:  Tender bump on the proximal anterior tibial region. Cancer patient. EXAM: RIGHT TIBIA AND FIBULA - 2 VIEW COMPARISON:  None Available. FINDINGS: No abnormal bone finding. No conclusive soft tissue finding by radiography. No sign of foreign object. IMPRESSION: No abnormal bone finding. No conclusive soft tissue finding by radiography. Consider ultrasound or MRI if there is a palpable abnormality. Electronically Signed   By: Paulina Fusi M.D.   On: 09/24/2022 18:17   US Venous Img Lower Unilateral Right  Result Date: 09/24/2022 CLINICAL DATA:  Right lower extremity pain and edema. History of rectal cancer. History of previous DVT. Evaluate for acute or chronic DVT. EXAM: RIGHT LOWER EXTREMITY VENOUS DOPPLER ULTRASOUND TECHNIQUE: Gray-scale sonography with graded compression, as well as color Doppler and duplex ultrasound were performed to evaluate the lower extremity deep venous systems from the level of the common femoral vein and including the common femoral, femoral, profunda femoral, popliteal and calf veins including the posterior tibial, peroneal and gastrocnemius veins when visible. The superficial great saphenous vein was also interrogated. Spectral Doppler was utilized to evaluate flow at rest and with distal  augmentation maneuvers in the common femoral, femoral and popliteal veins. COMPARISON:  None Available. FINDINGS: Contralateral Common Femoral Vein: Respiratory phasicity is normal and symmetric with the symptomatic side. No evidence of thrombus. Normal compressibility. Common Femoral Vein: No evidence of thrombus. Normal compressibility, respiratory  phasicity and response to augmentation. Saphenofemoral Junction: No evidence of thrombus. Normal compressibility and flow on color Doppler imaging. Profunda Femoral Vein: No evidence of thrombus. Normal compressibility and flow on color Doppler imaging. Femoral Vein: No evidence of thrombus. Normal compressibility, respiratory phasicity and response to augmentation. Popliteal Vein: No evidence of thrombus. Normal compressibility, respiratory phasicity and response to augmentation. Calf Veins: No evidence of thrombus. Normal compressibility and flow on color Doppler imaging. Superficial Great Saphenous Vein: No evidence of thrombus. Normal compressibility. Other Findings:  None. IMPRESSION: No evidence of acute or chronic DVT within the right lower extremity. Electronically Signed   By: Simonne Come M.D.   On: 09/24/2022 17:44

## 2022-09-24 ENCOUNTER — Ambulatory Visit (HOSPITAL_BASED_OUTPATIENT_CLINIC_OR_DEPARTMENT_OTHER)
Admission: RE | Admit: 2022-09-24 | Discharge: 2022-09-24 | Disposition: A | Discharge status: Home / Self Care | Payer: BC Managed Care – PPO | Source: Ambulatory Visit | Attending: Family Medicine | Admitting: Family Medicine

## 2022-09-24 ENCOUNTER — Encounter: Payer: Self-pay | Admitting: Family Medicine

## 2022-09-24 ENCOUNTER — Ambulatory Visit (INDEPENDENT_AMBULATORY_CARE_PROVIDER_SITE_OTHER): Payer: BC Managed Care – PPO | Admitting: Family Medicine

## 2022-09-24 VITALS — BP 124/80 | HR 95 | Temp 97.6°F | Resp 18 | Ht 67.0 in | Wt 291.6 lb

## 2022-09-24 DIAGNOSIS — R600 Localized edema: Secondary | ICD-10-CM | POA: Diagnosis not present

## 2022-09-24 DIAGNOSIS — M79604 Pain in right leg: Secondary | ICD-10-CM

## 2022-09-24 DIAGNOSIS — R2241 Localized swelling, mass and lump, right lower limb: Secondary | ICD-10-CM | POA: Diagnosis not present

## 2022-09-24 DIAGNOSIS — M25561 Pain in right knee: Secondary | ICD-10-CM | POA: Diagnosis not present

## 2022-09-24 NOTE — Patient Instructions (Signed)
You have orders for a right leg ultrasound and x-rays of your knee and shin bones.  Your ultrasound is scheduled for 5 PM here at the med center.  Perhaps come back around 415 so you can have your x-rays first-I will be in touch with your results ASAP

## 2022-10-01 ENCOUNTER — Inpatient Hospital Stay: Payer: BC Managed Care – PPO | Attending: Hematology & Oncology

## 2022-10-01 VITALS — BP 129/76 | HR 98 | Temp 98.4°F | Resp 18

## 2022-10-01 DIAGNOSIS — Z452 Encounter for adjustment and management of vascular access device: Secondary | ICD-10-CM | POA: Diagnosis not present

## 2022-10-01 DIAGNOSIS — Z85048 Personal history of other malignant neoplasm of rectum, rectosigmoid junction, and anus: Secondary | ICD-10-CM | POA: Diagnosis not present

## 2022-10-01 DIAGNOSIS — Z95828 Presence of other vascular implants and grafts: Secondary | ICD-10-CM

## 2022-10-01 MED ORDER — SODIUM CHLORIDE 0.9% FLUSH
10.0000 mL | Freq: Once | INTRAVENOUS | Status: AC
  Administered 2022-10-01: 10 mL via INTRAVENOUS

## 2022-10-01 MED ORDER — HEPARIN SOD (PORK) LOCK FLUSH 100 UNIT/ML IV SOLN
500.0000 [IU] | Freq: Once | INTRAVENOUS | Status: AC
  Administered 2022-10-01: 500 [IU] via INTRAVENOUS

## 2022-10-01 NOTE — Patient Instructions (Signed)

## 2022-10-08 ENCOUNTER — Ambulatory Visit: Payer: BC Managed Care – PPO | Admitting: Orthopaedic Surgery

## 2022-10-08 ENCOUNTER — Encounter: Payer: Self-pay | Admitting: Orthopaedic Surgery

## 2022-10-08 VITALS — Ht 66.0 in | Wt 290.0 lb

## 2022-10-08 DIAGNOSIS — M25561 Pain in right knee: Secondary | ICD-10-CM

## 2022-10-08 MED ORDER — METHYLPREDNISOLONE ACETATE 40 MG/ML IJ SUSP
40.0000 mg | INTRAMUSCULAR | Status: AC | PRN
Start: 2022-10-08 — End: 2022-10-08
  Administered 2022-10-08: 40 mg via INTRA_ARTICULAR

## 2022-10-08 MED ORDER — BUPIVACAINE HCL 0.5 % IJ SOLN
2.0000 mL | INTRAMUSCULAR | Status: AC | PRN
Start: 2022-10-08 — End: 2022-10-08
  Administered 2022-10-08: 2 mL via INTRA_ARTICULAR

## 2022-10-08 MED ORDER — LIDOCAINE HCL 1 % IJ SOLN
2.0000 mL | INTRAMUSCULAR | Status: AC | PRN
Start: 2022-10-08 — End: 2022-10-08
  Administered 2022-10-08: 2 mL

## 2022-10-08 NOTE — Progress Notes (Signed)
Office Visit Note   Patient: Janice Lucas           Date of Birth: Oct 07, 1965           MRN: [id number] Visit Date: 10/08/2022              Requested by: Pearline Cables, MD [address] [place],  [place] [address] PCP: Pearline Cables, MD   Assessment & Plan: Visit Diagnoses:  1. Acute pain of right knee     Plan: Impression is right knee arthritis flareup.  Today, we discussed various treatment options to include intra-articular cortisone injection for which she would like to proceed.  She will follow-up with [place] as needed.  Follow-Up Instructions: Return if symptoms worsen or fail to improve.   Orders:  No orders of the defined types were placed in this encounter.  No orders of the defined types were placed in this encounter.     Procedures: Large Joint Inj: R knee on 10/08/2022 2:07 PM Indications: pain Details: 22 G needle  Arthrogram: No  Medications: 40 mg methylPREDNISolone acetate 40 MG/ML; 2 mL lidocaine 1 %; 2 mL bupivacaine 0.5 % Consent was given by the patient. Patient was prepped and draped in the usual sterile fashion.       Clinical Data: No additional findings.   Subjective: Chief Complaint  Patient presents with   Right Knee - Pain    HPI patient is a pleasant 57 year old female who comes in today with right knee pain for the past 4 weeks.  She denies any injury or change in activity.  She does note the pain has not improved nor has worsened.  The pain is to the anterior aspect.  Symptoms are constant but worse going from a seated to standing position.  She has tried Tylenol with mild relief.  Recent x-rays of the right knee showed mild medial compartment OA.  No previous cortisone injection to the right knee.  Review of Systems as detailed in HPI.  All others reviewed and are negative.   Objective: Vital Signs: Ht 5\' 6"  (1.676 m)   Wt 290 lb (131.5 kg)   LMP 01/29/2011   BMI 46.81 kg/m   Physical Exam  well-developed well-nourished female in no acute distress.  Alert and oriented x 3.  Ortho Exam right knee exam: Range of motion 0 to 120 degrees.  Mild medial joint line tenderness.  No patellofemoral crepitus.  Ligaments are stable.  No tenderness to the proximal tibia.  She is neurovascularly intact distally.  Specialty Comments:  No specialty comments available.  Imaging: No new imaging   PMFS History: Patient Active Problem List   Diagnosis Date Noted   Insufficiency fracture of medial femoral condyle (HCC) 04/17/2022   Degenerative tear of left medial meniscus 04/17/2022   Estrogen deficiency 04/17/2022   Rectal cancer (HCC) 04/26/2021   Goals of care, counseling/discussion 04/26/2021   Allergic rhinitis 05/15/2020   Hypertension 07/05/2018   Migraine without aura and without status migrainosus, not intractable 02/08/2016   Depression 10/10/2015   Injury by electrocution 10/10/2015   Mixed hyperlipidemia 10/10/2015   Prediabetes 10/10/2015   Urinary incontinence 10/10/2015   Sleep apnea 08/30/2015   IIH (idiopathic intracranial hypertension) 11/14/2013   Herniated nucleus pulposus, C6-7 right 02/14/2011   Past Medical History:  Diagnosis Date   Allergy    SEASONAL - MILD   GERD (gastroesophageal reflux disease)    ON OMEPRAZOLE   Goals of  care, counseling/discussion 04/26/2021   Hyperlipidemia    ON MEDS   Hypertension    ON MEDS   Rectal cancer (HCC) 04/26/2021   Sleep apnea    WEARS CPAP    Family History  Problem Relation Age of Onset   Prostate cancer Father        METS TO LYMPH NODES AND BLADDER   Diabetes Maternal Aunt    Diabetes Maternal Uncle    Colon cancer Neg Hx    Colon polyps Neg Hx    Esophageal cancer Neg Hx    Rectal cancer Neg Hx    Stomach cancer Neg Hx     Past Surgical History:  Procedure Laterality Date   ANTERIOR CERVICAL DECOMP/DISCECTOMY FUSION  02/14/2011   Procedure: ANTERIOR CERVICAL DECOMPRESSION/DISCECTOMY FUSION 1 LEVEL;   Surgeon: Stefani Dama, MD;  Location: [facility];  Service: Neurosurgery;  Laterality: N/A;  Anterior Cervical Six-Seven Decompression and Fusion   COLON SURGERY  09/2021   COLON SURGERY  12/2021   IR IMAGING GUIDED PORT INSERTION  05/02/2021   Social History   Occupational History   Not on file  Tobacco Use   Smoking status: Former    Current packs/day: 1.00    Average packs/day: 1 pack/day for 20.0 years (20.0 ttl pk-yrs)    Types: Cigarettes   Smokeless tobacco: Current   Tobacco comments:    VAPES   Vaping Use   Vaping status: Every Day   Substances: Nicotine, Flavoring  Substance and Sexual Activity   Alcohol use: No    Comment: occassionally   Drug use: Not Currently   Sexual activity: Not Currently

## 2022-10-14 ENCOUNTER — Other Ambulatory Visit (HOSPITAL_COMMUNITY): Payer: Self-pay

## 2022-10-19 NOTE — Progress Notes (Unsigned)
[facility] [address] [place], [place] [address] [phone]  Date:  10/22/2022   Name:  Janice Lucas   DOB:  Dec 29, 1965   MRN:  [id number]  PCP:  Pearline Cables, MD    Chief Complaint: No chief complaint on file.   History of Present Illness:  Janice Lucas is a 57 y.o. very pleasant female patient who presents with the following:  Patient seen today with concern of pain in her left foot Most recent visit with myself was in September when she noted a tender bump below her right knee Films and ultrasound which was negative for blood clot She has not saw orthopedist, Dr. Shelby Sink at the end of September who gave her an intra-articular corticosteroid injection  Pt with history of HTN, migraine HA, hyperlipidemia, intracranial hypertension/ pseudotumor cerebri dx in 2015, mild diabetes.  She had a colonoscopy in March 2023 and was diagnosed with localized rectal cancer  She saw her oncologist Dr. 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  We tried her on neoadjuvant chemotherapy which she tolerated incredibly poorly.  As such, we finally did get her to surgery.  She had no residual disease at surgery. For right now, I think that we probably do not need another MRI probably until the end of the year.  Patient Active Problem List   Diagnosis Date Noted   Insufficiency fracture of medial femoral condyle (HCC) 04/17/2022   Degenerative tear of left medial meniscus 04/17/2022    Estrogen deficiency 04/17/2022   Rectal cancer (HCC) 04/26/2021   Goals of care, counseling/discussion 04/26/2021   Allergic rhinitis 05/15/2020   Hypertension 07/05/2018   Migraine without aura and without status migrainosus, not intractable 02/08/2016   Depression 10/10/2015   Injury by electrocution 10/10/2015   Mixed hyperlipidemia 10/10/2015   Prediabetes 10/10/2015   Urinary incontinence 10/10/2015   Sleep apnea 08/30/2015   IIH (idiopathic intracranial hypertension) 11/14/2013   Herniated nucleus pulposus, C6-7 right 02/14/2011    Past Medical History:  Diagnosis Date   Allergy    SEASONAL - MILD   GERD (gastroesophageal reflux disease)    ON OMEPRAZOLE   Goals of care, counseling/discussion 04/26/2021   Hyperlipidemia    ON MEDS   Hypertension    ON MEDS   Rectal cancer (HCC) 04/26/2021   Sleep apnea    WEARS CPAP    Past Surgical History:  Procedure Laterality Date   ANTERIOR CERVICAL DECOMP/DISCECTOMY FUSION  02/14/2011   Procedure: ANTERIOR CERVICAL DECOMPRESSION/DISCECTOMY FUSION 1 LEVEL;  Surgeon: Stefani Dama, MD;  Location: [facility];  Service: Neurosurgery;  Laterality: N/A;  Anterior Cervical Six-Seven Decompression and Fusion   COLON SURGERY  09/2021   COLON SURGERY  12/2021   IR IMAGING GUIDED PORT INSERTION  05/02/2021    Social History   Tobacco Use   Smoking status: Former    Current packs/day: 1.00    Average packs/day: 1 pack/day for 20.0 years (20.0 ttl pk-yrs)  Types: Cigarettes   Smokeless tobacco: Current   Tobacco comments:    VAPES   Vaping Use   Vaping status: Every Day   Substances: Nicotine, Flavoring  Substance Use Topics   Alcohol use: No    Comment: occassionally   Drug use: Not Currently    Family History  Problem Relation Age of Onset   Prostate cancer Father        METS TO LYMPH NODES AND BLADDER   Diabetes Maternal Aunt    Diabetes Maternal Uncle    Colon cancer Neg Hx    Colon polyps Neg Hx    Esophageal  cancer Neg Hx    Rectal cancer Neg Hx    Stomach cancer Neg Hx     Allergies  Allergen Reactions   Sulfamethoxazole Other (See Comments)    Makes me loopy.   Neomycin-Bacitracin Zn-Polymyx Rash   Sulfa Antibiotics Other (See Comments)    Acts "goofy"    Medication list has been reviewed and updated.  Current Outpatient Medications on File Prior to Visit  Medication Sig Dispense Refill   acetaminophen (TYLENOL) 500 MG tablet Take 500 mg by mouth every 6 (six) hours as needed.     Ascorbic Acid (VITAMIN C) 1000 MG tablet Take 1,000 mg by mouth daily.     atorvastatin (LIPITOR) 20 MG tablet Take 1 tablet  by mouth daily. 90 tablet 3   Biotin [address] MCG TABS Take by mouth daily.     Calcium Carb-Cholecalciferol (CALCIUM 1000 + D PO) Take by mouth 2 (two) times daily.     cholecalciferol (VITAMIN D3) 25 MCG (1000 UT) tablet Take 1,000 Units by mouth daily.     fluticasone (FLONASE) 50 MCG/ACT nasal spray Place 2 sprays into both nostrils daily. 16 g 6   hydrochlorothiazide (HYDRODIURIL) 25 MG tablet Take 1 tablet (25 mg total) by mouth daily. 90 tablet 3   losartan (COZAAR) 50 MG tablet Take 1 tablet (50 mg total) by mouth daily. 90 tablet 3   metFORMIN (GLUCOPHAGE) 500 MG tablet Take 1 tablet (500 mg total) by mouth daily with breakfast. 90 tablet 3   nortriptyline (PAMELOR) 50 MG capsule Take 2 capsules (100 mg total) by mouth every night as directed 180 capsule 3   omeprazole (PRILOSEC) 40 MG capsule Take 1 capsule (40 mg total) by mouth daily. 90 capsule 1   potassium chloride SA (KLOR-CON M) 20 MEQ tablet Take 1 tablet (20 mEq total) by mouth 2 (two) times daily. 60 tablet 6   topiramate (TOPAMAX) 100 MG tablet Take 1 tablet by mouth 2  times daily. 180 tablet 3   vitamin E 1000 UNIT capsule Take 1,000 Units by mouth daily.     [DISCONTINUED] prochlorperazine (COMPAZINE) 10 MG tablet Take 1 tablet (10 mg total) by mouth every 6 (six) hours as needed (Nausea or vomiting). 30 tablet 1    No current facility-administered medications on file prior to visit.    Review of Systems:  As per HPI- otherwise negative.   Physical Examination: There were no vitals filed for this visit. There were no vitals filed for this visit. There is no height or weight on file to calculate BMI. Ideal Body Weight:    GEN: no acute distress. HEENT: Atraumatic, Normocephalic.  Ears and Nose: No external deformity. CV: RRR, No M/G/R. No JVD. No thrill. No extra heart sounds. PULM: CTA B, no wheezes, crackles, rhonchi. No retractions. No resp. distress. No accessory muscle use. ABD: S, NT,  ND, +BS. No rebound. No HSM. EXTR: No c/c/e PSYCH: Normally interactive. Conversant.    Assessment and Plan: ***  Signed Abbe Amsterdam, MD

## 2022-10-21 ENCOUNTER — Other Ambulatory Visit (HOSPITAL_COMMUNITY): Payer: Self-pay

## 2022-10-21 ENCOUNTER — Ambulatory Visit (AMBULATORY_SURGERY_CENTER): Payer: BC Managed Care – PPO

## 2022-10-21 VITALS — Ht 67.0 in | Wt 282.0 lb

## 2022-10-21 DIAGNOSIS — C20 Malignant neoplasm of rectum: Secondary | ICD-10-CM

## 2022-10-21 MED ORDER — NA SULFATE-K SULFATE-MG SULF 17.5-3.13-1.6 GM/177ML PO SOLN
1.0000 | Freq: Once | ORAL | 0 refills | Status: AC
  Filled 2022-10-21: qty 354, 1d supply, fill #0

## 2022-10-21 NOTE — Progress Notes (Signed)
Pre visit completed via phone call; Patient verified name, DOB, and address; No egg or soy allergy known to patient;  No issues known to pt with past sedation with any surgeries or procedures; Patient denies ever being told they had issues or difficulty with intubation;  No FH of Malignant Hyperthermia; Pt is not on diet pills; Pt is not on home 02;  Pt is not on blood thinners;  Pt denies issues with constipation;  No A fib or A flutter; Have any cardiac testing pending--NO Insurance verified during PV appt--- BCBS Pt can ambulate without assistance;  Pt denies use of chewing tobacco; Discussed diabetic/weight loss medication holds; Discussed NSAID holds; Checked BMI to be less than 50; Pt instructed to use Singlecare.com or GoodRx for a price reduction on prep;  Patient's chart reviewed by Cathlyn Parsons CNRA prior to previsit and patient appropriate for the [facility];  Pre visit completed and red dot placed by patient's name on their procedure day (on provider's schedule);   Instructions sent to MyChart as well as printed and placed on 2nd floor receptionist for patient to pick up per her request;

## 2022-10-22 ENCOUNTER — Ambulatory Visit: Payer: BC Managed Care – PPO | Admitting: Family Medicine

## 2022-10-22 VITALS — BP 120/80 | HR 89 | Temp 97.7°F | Resp 18 | Ht 67.0 in | Wt 286.6 lb

## 2022-10-22 DIAGNOSIS — M79672 Pain in left foot: Secondary | ICD-10-CM | POA: Diagnosis not present

## 2022-10-23 ENCOUNTER — Other Ambulatory Visit (HOSPITAL_COMMUNITY): Payer: Self-pay

## 2022-10-23 ENCOUNTER — Ambulatory Visit (INDEPENDENT_AMBULATORY_CARE_PROVIDER_SITE_OTHER): Payer: BC Managed Care – PPO

## 2022-10-23 ENCOUNTER — Encounter: Payer: Self-pay | Admitting: Family Medicine

## 2022-10-23 ENCOUNTER — Encounter (HOSPITAL_BASED_OUTPATIENT_CLINIC_OR_DEPARTMENT_OTHER): Payer: Self-pay | Admitting: Student

## 2022-10-23 ENCOUNTER — Ambulatory Visit (HOSPITAL_BASED_OUTPATIENT_CLINIC_OR_DEPARTMENT_OTHER): Payer: BC Managed Care – PPO | Admitting: Student

## 2022-10-23 DIAGNOSIS — M25572 Pain in left ankle and joints of left foot: Secondary | ICD-10-CM

## 2022-10-23 DIAGNOSIS — M7732 Calcaneal spur, left foot: Secondary | ICD-10-CM | POA: Diagnosis not present

## 2022-10-23 DIAGNOSIS — M7752 Other enthesopathy of left foot: Secondary | ICD-10-CM

## 2022-10-23 DIAGNOSIS — M79672 Pain in left foot: Secondary | ICD-10-CM | POA: Diagnosis not present

## 2022-10-23 NOTE — Progress Notes (Signed)
Chief Complaint: Left foot pain     History of Present Illness:    Janice Lucas is a 57 y.o. female presenting today for evaluation of left foot pain.  Her foot has been bothering her for months previously, however her foot gave out and turned inwards while carrying a sofa last night and this has been extremely more bothersome.  Pain is located more on the lateral side of the foot.  No pain in the ankle.  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  Her mom and grandfather have both previously had bones surgically removed from the lateral foot.  Denies any pain medications or other treatments at this time.  Surgical History:   None  PMH/PSH/Family History/Social History/Meds/Allergies:    Past Medical History:  Diagnosis Date   Allergy    SEASONAL - MILD   Diabetes mellitus without complication (HCC)    diet controlled/with meds   GERD (gastroesophageal reflux disease)    ON OMEPRAZOLE   Goals of care, counseling/discussion 04/26/2021   Hyperlipidemia    ON MEDS   Hypertension    ON MEDS   Rectal cancer (HCC) 04/26/2021   Sleep apnea    WEARS CPAP   Past Surgical History:  Procedure Laterality Date   ANTERIOR CERVICAL DECOMP/DISCECTOMY FUSION  02/14/2011   Procedure: ANTERIOR CERVICAL DECOMPRESSION/DISCECTOMY FUSION 1 LEVEL;  Surgeon: Stefani Dama, MD;  Location: [facility];  Service: Neurosurgery;  Laterality: N/A;  Anterior Cervical Six-Seven Decompression and Fusion   COLON SURGERY  09/2021   COLON SURGERY  12/2021   IR IMAGING GUIDED PORT INSERTION  05/02/2021   Social History   Socioeconomic History   Marital status: Married    Spouse name: Not on file   Number of children: Not on file   Years of education: Not on file   Highest education level: Not on file  Occupational History   Not on file  Tobacco Use   Smoking status: Former    Current  packs/day: 1.00    Average packs/day: 1 pack/day for 20.0 years (20.0 ttl pk-yrs)    Types: Cigarettes   Smokeless tobacco: Never   Tobacco comments:    VAPES   Vaping Use   Vaping status: Every Day   Substances: Nicotine, Flavoring  Substance and Sexual Activity   Alcohol use: No    Comment: occassionally   Drug use: Not Currently   Sexual activity: Not Currently  Other Topics Concern   Not on file  Social History Narrative   Right Handed   Lives in a one story home   Drinks a big cup of coffee daily   Social Determinants of Health   Financial Resource Strain: Not on file  Food Insecurity: No Food Insecurity (12/19/2021)   Hunger Vital Sign    Worried About Running Out of Food in the Last Year: Never true    Ran Out of Food in the Last Year: Never true  Transportation Needs: No Transportation Needs (12/19/2021)   PRAPARE - Administrator, Civil Service (Medical): No    Lack of Transportation (Non-Medical): No  Physical Activity: Not on file  Stress: Not on file  Social Connections: Unknown (05/26/2021)  Received from [employer], [facility]   Social Network    Social Network: Not on file   Family History  Problem Relation Age of Onset   Colon polyps Father    Prostate cancer Father        METS TO LYMPH NODES AND BLADDER   Diabetes Maternal Aunt    Diabetes Maternal Uncle    Colon cancer Neg Hx    Esophageal cancer Neg Hx    Rectal cancer Neg Hx    Stomach cancer Neg Hx    Allergies  Allergen Reactions   Sulfamethoxazole Other (See Comments)    Makes me loopy.   Neomycin-Bacitracin Zn-Polymyx Rash   Sulfa Antibiotics Other (See Comments)    Acts "goofy"   Current Outpatient Medications  Medication Sig Dispense Refill   acetaminophen (TYLENOL) 500 MG tablet Take 500 mg by mouth every 6 (six) hours as needed.     Ascorbic Acid (VITAMIN C) 1000 MG tablet Take 1,000 mg by mouth daily.     atorvastatin (LIPITOR) 20 MG tablet Take 1 tablet  by  mouth daily. 90 tablet 3   Biotin 40981 MCG TABS Take by mouth daily.     Calcium Carb-Cholecalciferol (CALCIUM 1000 + D PO) Take by mouth 2 (two) times daily.     cholecalciferol (VITAMIN D3) 25 MCG (1000 UT) tablet Take 1,000 Units by mouth daily.     fluticasone (FLONASE) 50 MCG/ACT nasal spray Place 2 sprays into both nostrils daily. (Patient taking differently: Place 2 sprays into both nostrils daily as needed for allergies.) 16 g 6   losartan (COZAAR) 50 MG tablet Take 1 tablet (50 mg total) by mouth daily. 90 tablet 3   magnesium 30 MG tablet Take 30 mg by mouth 2 (two) times daily.     metFORMIN (GLUCOPHAGE) 500 MG tablet Take 1 tablet (500 mg total) by mouth daily with breakfast. 90 tablet 3   Na Sulfate-K Sulfate-Mg Sulf 17.5-3.13-1.6 GM/177ML SOLN Take 1 kit by mouth once for 1 dose. Generic okay; Please use GoodRx or SingleCare coupon code for price reduction if able; 354 mL 0   nortriptyline (PAMELOR) 50 MG capsule Take 2 capsules (100 mg total) by mouth every night as directed 180 capsule 3   omeprazole (PRILOSEC) 40 MG capsule Take 1 capsule (40 mg total) by mouth daily. 90 capsule 1   potassium chloride SA (KLOR-CON M) 20 MEQ tablet Take 1 tablet (20 mEq total) by mouth 2 (two) times daily. (Patient taking differently: Take 20 mEq by mouth daily.) 60 tablet 6   topiramate (TOPAMAX) 100 MG tablet Take 1 tablet by mouth 2  times daily. 180 tablet 3   vitamin E 1000 UNIT capsule Take 1,000 Units by mouth daily.     No current facility-administered medications for this visit.   No results found.  Review of Systems:   A ROS was performed including pertinent positives and negatives as documented in the HPI.  Physical Exam :   Constitutional: NAD and appears stated age Neurological: Alert and oriented Psych: Appropriate affect and cooperative Last menstrual period 01/29/2011.   Comprehensive Musculoskeletal Exam:    Tenderness to palpation of the lateral foot at the cuboid.  No  fifth metatarsal tenderness.  Full ankle ROM with plantarflexion and dorsiflexion.  Some pain noted in plantarflexion.  Negative anterior drawer and Thompson test.  DP pulse 2+.  Neurosensory exam is intact.  Imaging:   Xray (left foot 3 views): Negative for acute fracture or  dislocation.  Large os peroneum present.   I personally reviewed and interpreted the radiographs.   Assessment:   57 y.o. female with lateral left foot pain.  This was worsened from an injury last night however she has had pain ongoing chronically.  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  Would like to start with immobilization and place her in a postop shoe today which she seemed to tolerate well.  Encouraged use of anti-inflammatories and icing.  She can continue weightbearing as tolerated.  I will plan to see her back in 4 weeks for follow-up assessment.  Plan :    -Return to clinic in 4 weeks for reassessment     I personally saw and evaluated the patient, and participated in the management and treatment plan.  Hazle Nordmann, PA-C Orthopedics

## 2022-10-29 ENCOUNTER — Encounter: Payer: Self-pay | Admitting: Gastroenterology

## 2022-11-11 ENCOUNTER — Encounter: Payer: Self-pay | Admitting: Gastroenterology

## 2022-11-11 ENCOUNTER — Ambulatory Visit (AMBULATORY_SURGERY_CENTER): Payer: BC Managed Care – PPO | Admitting: Gastroenterology

## 2022-11-11 VITALS — BP 116/71 | HR 73 | Temp 98.6°F | Resp 21 | Ht 67.0 in | Wt 282.0 lb

## 2022-11-11 DIAGNOSIS — Z08 Encounter for follow-up examination after completed treatment for malignant neoplasm: Secondary | ICD-10-CM | POA: Diagnosis not present

## 2022-11-11 DIAGNOSIS — C20 Malignant neoplasm of rectum: Secondary | ICD-10-CM | POA: Diagnosis not present

## 2022-11-11 DIAGNOSIS — Z85038 Personal history of other malignant neoplasm of large intestine: Secondary | ICD-10-CM

## 2022-11-11 MED ORDER — SODIUM CHLORIDE 0.9 % IV SOLN: 500.0000 mL | Freq: Once | INTRAVENOUS | Status: DC

## 2022-11-11 NOTE — Op Note (Addendum)
[facility] Patient Name: Janice Lucas Procedure Date: 11/11/2022 1:39 PM MRN: [id number] Endoscopist: Lynann Bologna , MD, [id number] Age: 57 Referring MD:  Date of Birth: 08-14-1965 Gender: Female Account #: [id number] Procedure:                Colonoscopy Indications:              High risk colon cancer surveillance: Personal                            history of colon cancer- Stage IIA (T3bN0M0)                            adenocarcinoma of the rectum s/p APR 09/2021 Medicines:                Monitored Anesthesia Care Procedure:                Pre-Anesthesia Assessment:                           - Prior to the procedure, a History and Physical                            was performed, and patient medications and                            allergies were reviewed. The patient's tolerance of                            previous anesthesia was also reviewed. The risks                            and benefits of the procedure and the sedation                            options and risks were discussed with the patient.                            All questions were answered, and informed consent                            was obtained. Prior Anticoagulants: The patient has                            taken no anticoagulant or antiplatelet agents. ASA                            Grade Assessment: II - A patient with mild systemic                            disease. After reviewing the risks and benefits,                            the patient was deemed in satisfactory condition to  undergo the procedure.                           After obtaining informed consent, the colonoscope                            was passed under direct vision. Throughout the                            procedure, the patient's blood pressure, pulse, and                            oxygen saturations were monitored continuously. The                            Olympus Scope SN:  [id number] was introduced through                            the anus and advanced to the the cecum, identified                            by appendiceal orifice and ileocecal valve. The                            colonoscopy was performed without difficulty. The                            patient tolerated the procedure well. The quality                            of the bowel preparation was adequate to identify                            polyps. The ileocecal valve, appendiceal orifice,                            and rectum were photographed. Scope In: 1:51:10 PM Scope Out: 2:02:52 PM Scope Withdrawal Time: 0 hours 7 minutes 29 seconds  Total Procedure Duration: 0 hours 11 minutes 42 seconds  Findings:                 There was evidence of a prior end-to-end                            colo-colonic anastomosis in the rectum, 3 cm from                            the dentate line. This was patent and was                            characterized by healthy appearing mucosa. No                            recurrence.  A few small-mouthed diverticula were found in the                            sigmoid colon. Incidental note was made of a 1 cm                            lipoma in the proximal ascending colon.                           Non-bleeding internal hemorrhoids were found during                            retroflexion. The hemorrhoids were small and Grade                            I (internal hemorrhoids that do not prolapse).                           The exam was otherwise without abnormality on                            direct and retroflexion views. Complications:            No immediate complications. Estimated Blood Loss:     Estimated blood loss: none. Impression:               - Patent end-to-end colo-colonic anastomosis,                            characterized by healthy appearing mucosa. No                            recurrence.                            - Minimal sigmoid diverticulosis.                           - Non-bleeding internal hemorrhoids.                           - The examination was otherwise normal on direct                            and retroflexion views.                           - No specimens collected. Recommendation:           - Patient has a contact number available for                            emergencies. The signs and symptoms of potential                            delayed complications were discussed with the  patient. Return to normal activities tomorrow.                            Written discharge instructions were provided to the                            patient.                           - Resume previous diet.                           - Continue present medications.                           - Repeat colonoscopy in 3 years for screening                            purposes.                           - The findings and recommendations were discussed                            with the patient's family. Lynann Bologna, MD 11/11/2022 2:09:02 PM This report has been signed electronically.

## 2022-11-11 NOTE — Progress Notes (Signed)
Pt's states no medical or surgical changes since previsit or office visit. 

## 2022-11-11 NOTE — Progress Notes (Signed)
To pacu, VSS. Report to RN.tb 

## 2022-11-11 NOTE — Patient Instructions (Signed)
Discharge instructions given. Handouts on Diverticulosis and Hemorrhoids. Resume previous medications. YOU HAD AN ENDOSCOPIC PROCEDURE TODAY AT THE [facility]:   Refer to the procedure report that was given to you for any specific questions about what was found during the examination.  If the procedure report does not answer your questions, please call your gastroenterologist to clarify.  If you requested that your care partner not be given the details of your procedure findings, then the procedure report has been included in a sealed envelope for you to review at your convenience later.  YOU SHOULD EXPECT: Some feelings of bloating in the abdomen. Passage of more gas than usual.  Walking can help get rid of the air that was put into your GI tract during the procedure and reduce the bloating. If you had a lower endoscopy (such as a colonoscopy or flexible sigmoidoscopy) you may notice spotting of blood in your stool or on the toilet paper. If you underwent a bowel prep for your procedure, you may not have a normal bowel movement for a few days.  Please Note:  You might notice some irritation and congestion in your nose or some drainage.  This is from the oxygen used during your procedure.  There is no need for concern and it should clear up in a day or so.  SYMPTOMS TO REPORT IMMEDIATELY:  Following lower endoscopy (colonoscopy or flexible sigmoidoscopy):  Excessive amounts of blood in the stool  Significant tenderness or worsening of abdominal pains  Swelling of the abdomen that is new, acute  Fever of 100F or higher  For urgent or emergent issues, a gastroenterologist can be reached at any hour by calling [phone]. Do not use MyChart messaging for urgent concerns.    DIET:  We do recommend a small meal at first, but then you may proceed to your regular diet.  Drink plenty of fluids but you should avoid alcoholic beverages for 24 hours.  ACTIVITY:  You should plan to take  it easy for the rest of today and you should NOT DRIVE or use heavy machinery until tomorrow (because of the sedation medicines used during the test).    FOLLOW UP: Our staff will call the number listed on your records the next business day following your procedure.  We will call around 7:15- 8:00 am to check on you and address any questions or concerns that you may have regarding the information given to you following your procedure. If we do not reach you, we will leave a message.     If any biopsies were taken you will be contacted by phone or by letter within the next 1-3 weeks.  Please call us at [phone] if you have not heard about the biopsies in 3 weeks.    SIGNATURES/CONFIDENTIALITY: You and/or your care partner have signed paperwork which will be entered into your electronic medical record.  These signatures attest to the fact that that the information above on your After Visit Summary has been reviewed and is understood.  Full responsibility of the confidentiality of this discharge information lies with you and/or your care-partner.  

## 2022-11-11 NOTE — Progress Notes (Signed)
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 female    Past Medical History:  Diagnosis Date   Allergy    SEASONAL - MILD   Diabetes mellitus without complication (HCC)    diet controlled/with meds   GERD (gastroesophageal reflux disease)    ON OMEPRAZOLE   Goals of care, counseling/discussion 04/26/2021   Hyperlipidemia    ON MEDS   Hypertension    ON MEDS   Rectal cancer (HCC) 04/26/2021   Sleep apnea    WEARS CPAP    Past Surgical History:  Procedure Laterality Date   ANTERIOR CERVICAL DECOMP/DISCECTOMY FUSION  02/14/2011   Procedure: ANTERIOR CERVICAL DECOMPRESSION/DISCECTOMY FUSION 1 LEVEL;  Surgeon: Stefani Dama, MD;  Location: [facility];  Service: Neurosurgery;  Laterality: N/A;  Anterior Cervical Six-Seven Decompression and Fusion   COLON SURGERY  09/2021   COLON SURGERY  12/2021   IR IMAGING GUIDED PORT INSERTION  05/02/2021    Prior to Admission medications   Medication Sig Start Date End Date Taking? Authorizing Provider  Ascorbic Acid (VITAMIN C) 1000 MG tablet Take 1,000 mg by mouth daily.   Yes [provider]  atorvastatin (LIPITOR) 20 MG tablet Take 1 tablet  by mouth daily. 03/12/22  Yes Copland, Gwenlyn Found, MD  Biotin 81191 MCG TABS Take by mouth daily.   Yes [provider]  Calcium Carb-Cholecalciferol (CALCIUM 1000 + D PO) Take by mouth 2 (two) times daily.   Yes [provider]  cholecalciferol (VITAMIN D3) 25 MCG (1000 UT) tablet Take 1,000 Units by mouth daily.   Yes [provider]  losartan (COZAAR) 50 MG tablet Take 1 tablet (50 mg total) by mouth daily. 09/03/22  Yes Copland, Gwenlyn Found, MD  magnesium 30 MG tablet Take 30 mg by mouth 2 (two) times daily.   Yes [provider]  metFORMIN  (GLUCOPHAGE) 500 MG tablet Take 1 tablet (500 mg total) by mouth daily with breakfast. 09/05/22  Yes Copland, Gwenlyn Found, MD  nortriptyline (PAMELOR) 50 MG capsule Take 2 capsules (100 mg total) by mouth every night as directed 07/01/22  Yes Van Clines, MD  omeprazole (PRILOSEC) 40 MG capsule Take 1 capsule (40 mg total) by mouth daily. 09/05/22  Yes Copland, Gwenlyn Found, MD  potassium chloride SA (KLOR-CON M) 20 MEQ tablet Take 1 tablet (20 mEq total) by mouth 2 (two) times daily. Patient taking differently: Take 20 mEq by mouth daily. 04/09/22  Yes Erenest Blank, NP  topiramate (TOPAMAX) 100 MG tablet Take 1 tablet by mouth 2  times daily. 07/01/22  Yes Van Clines, MD  vitamin E 1000 UNIT capsule Take 1,000 Units by mouth daily.   Yes [provider]  acetaminophen (TYLENOL) 500 MG tablet Take 500 mg by mouth every 6 (six) hours as needed.    [provider]  fluticasone (FLONASE) 50 MCG/ACT nasal spray Place 2 sprays into both nostrils daily. Patient taking differently: Place 2 sprays into both nostrils daily as needed for allergies. 02/11/21   Copland, Gwenlyn Found, MD  prochlorperazine (COMPAZINE) 10 MG tablet Take 1 tablet (10 mg total) by mouth every 6 (six) hours as needed (Nausea or vomiting). 04/29/21 06/04/21  Arlan Organ  R, MD    Current Outpatient Medications  Medication Sig Dispense Refill   Ascorbic Acid (VITAMIN C) 1000 MG tablet Take 1,000 mg by mouth daily.     atorvastatin (LIPITOR) 20 MG tablet Take 1 tablet  by mouth daily. 90 tablet 3   Biotin 10272 MCG TABS Take by mouth daily.     Calcium Carb-Cholecalciferol (CALCIUM 1000 + D PO) Take by mouth 2 (two) times daily.     cholecalciferol (VITAMIN D3) 25 MCG (1000 UT) tablet Take 1,000 Units by mouth daily.     losartan (COZAAR) 50 MG tablet Take 1 tablet (50 mg total) by mouth daily. 90 tablet 3   magnesium 30 MG tablet Take 30 mg by mouth 2 (two) times daily.     metFORMIN (GLUCOPHAGE) 500 MG tablet Take  1 tablet (500 mg total) by mouth daily with breakfast. 90 tablet 3   nortriptyline (PAMELOR) 50 MG capsule Take 2 capsules (100 mg total) by mouth every night as directed 180 capsule 3   omeprazole (PRILOSEC) 40 MG capsule Take 1 capsule (40 mg total) by mouth daily. 90 capsule 1   potassium chloride SA (KLOR-CON M) 20 MEQ tablet Take 1 tablet (20 mEq total) by mouth 2 (two) times daily. (Patient taking differently: Take 20 mEq by mouth daily.) 60 tablet 6   topiramate (TOPAMAX) 100 MG tablet Take 1 tablet by mouth 2  times daily. 180 tablet 3   vitamin E 1000 UNIT capsule Take 1,000 Units by mouth daily.     acetaminophen (TYLENOL) 500 MG tablet Take 500 mg by mouth every 6 (six) hours as needed.     fluticasone (FLONASE) 50 MCG/ACT nasal spray Place 2 sprays into both nostrils daily. (Patient taking differently: Place 2 sprays into both nostrils daily as needed for allergies.) 16 g 6   Current Facility-Administered Medications  Medication Dose Route Frequency Provider Last Rate Last Admin   0.9 %  sodium chloride infusion  500 mL Intravenous Once Lynann Bologna, MD        Allergies as of 11/11/2022 - Review Complete 11/11/2022  Allergen Reaction Noted   Sulfamethoxazole Other (See Comments) 11/11/2013   Neomycin-bacitracin zn-polymyx Rash 11/11/2013   Sulfa antibiotics Other (See Comments) 02/14/2011    Family History  Problem Relation Age of Onset   Colon polyps Father    Prostate cancer Father        METS TO LYMPH NODES AND BLADDER   Diabetes Maternal Aunt    Diabetes Maternal Uncle    Colon cancer Neg Hx    Esophageal cancer Neg Hx    Rectal cancer Neg Hx    Stomach cancer Neg Hx     Social History   Socioeconomic History   Marital status: Married    Spouse name: Not on file   Number of children: Not on file   Years of education: Not on file   Highest education level: Not on file  Occupational History   Not on file  Tobacco Use   Smoking status: Former    Current  packs/day: 1.00    Average packs/day: 1 pack/day for 20.0 years (20.0 ttl pk-yrs)    Types: Cigarettes   Smokeless tobacco: Never   Tobacco comments:    VAPES   Vaping Use   Vaping status: Every Day   Substances: Nicotine, Flavoring  Substance and Sexual Activity   Alcohol use: No    Comment: occassionally   Drug use: Not Currently   Sexual activity:  Not Currently  Other Topics Concern   Not on file  Social History Narrative   Right Handed   Lives in a one story home   Drinks a big cup of coffee daily   Social Determinants of Health   Financial Resource Strain: Not on file  Food Insecurity: No Food Insecurity (12/19/2021)   Hunger Vital Sign    Worried About Running Out of Food in the Last Year: Never true    Ran Out of Food in the Last Year: Never true  Transportation Needs: No Transportation Needs (12/19/2021)   PRAPARE - Administrator, Civil Service (Medical): No    Lack of Transportation (Non-Medical): No  Physical Activity: Not on file  Stress: Not on file  Social Connections: Unknown (05/26/2021)   [facility], [facility]   Social Network    Social Network: Not on file  Intimate Partner Violence: Unknown (04/17/2021)   Received from [facility], [facility]   HITS    Physically Hurt: Not on file    Insult or Talk Down To: Not on file    Threaten Physical Harm: Not on file    Scream or Curse: Not on file    Review of Systems: Positive for none All other review of systems negative except as mentioned in the HPI.  Physical Exam: Vital signs in last 24 hours: @VSRANGES @   General:   Alert,  Well-developed, well-nourished, pleasant and cooperative in NAD Lungs:  Clear throughout to auscultation.   Heart:  Regular rate and rhythm; no murmurs, clicks, rubs,  or gallops. Abdomen:  Soft, nontender and nondistended. Normal bowel sounds.   Neuro/Psych:  Alert and cooperative. Normal mood and affect. A and O x 3    No significant  changes were identified.  The patient continues to be an appropriate candidate for the planned procedure and anesthesia.   Edman Circle, MD. [facility] 11/11/2022 1:46 PM@

## 2022-11-12 ENCOUNTER — Telehealth: Payer: Self-pay

## 2022-11-12 NOTE — Telephone Encounter (Signed)
  Follow up Call-     11/11/2022    1:22 PM 04/03/2021    9:00 AM  Call back number  Post procedure Call Back phone  # [phone]  Permission to leave phone message Yes Yes     Patient questions:  Do you have a fever, pain , or abdominal swelling? No. Pain Score  0 *  Have you tolerated food without any problems? Yes.    Have you been able to return to your normal activities? Yes.    Do you have any questions about your discharge instructions: Diet   No. Medications  No. Follow up visit  No.  Do you have questions or concerns about your Care? No.  Actions: * If pain score is 4 or above: No action needed, pain <4.

## 2022-11-19 ENCOUNTER — Encounter: Payer: Self-pay | Admitting: Hematology & Oncology

## 2022-11-19 ENCOUNTER — Inpatient Hospital Stay: Payer: BC Managed Care – PPO | Attending: Hematology & Oncology

## 2022-11-19 ENCOUNTER — Inpatient Hospital Stay (HOSPITAL_BASED_OUTPATIENT_CLINIC_OR_DEPARTMENT_OTHER): Payer: BC Managed Care – PPO | Admitting: Hematology & Oncology

## 2022-11-19 ENCOUNTER — Inpatient Hospital Stay: Payer: BC Managed Care – PPO

## 2022-11-19 VITALS — BP 121/75 | HR 88 | Temp 97.8°F | Resp 20

## 2022-11-19 DIAGNOSIS — Z452 Encounter for adjustment and management of vascular access device: Secondary | ICD-10-CM | POA: Insufficient documentation

## 2022-11-19 DIAGNOSIS — I743 Embolism and thrombosis of arteries of the lower extremities: Secondary | ICD-10-CM | POA: Diagnosis not present

## 2022-11-19 DIAGNOSIS — E66813 Obesity, class 3: Secondary | ICD-10-CM | POA: Diagnosis not present

## 2022-11-19 DIAGNOSIS — Z85048 Personal history of other malignant neoplasm of rectum, rectosigmoid junction, and anus: Secondary | ICD-10-CM | POA: Diagnosis not present

## 2022-11-19 DIAGNOSIS — Z6841 Body Mass Index (BMI) 40.0 and over, adult: Secondary | ICD-10-CM

## 2022-11-19 DIAGNOSIS — C20 Malignant neoplasm of rectum: Secondary | ICD-10-CM

## 2022-11-19 DIAGNOSIS — Z95828 Presence of other vascular implants and grafts: Secondary | ICD-10-CM

## 2022-11-19 LAB — CBC WITH DIFFERENTIAL (CANCER CENTER ONLY)
Abs Immature Granulocytes: 0.02 10*3/uL (ref 0.00–0.07)
Basophils Absolute: 0 10*3/uL (ref 0.0–0.1)
Basophils Relative: 0 %
Eosinophils Absolute: 0.3 10*3/uL (ref 0.0–0.5)
Eosinophils Relative: 4 %
HCT: 38.6 % (ref 36.0–46.0)
Hemoglobin: 12.5 g/dL (ref 12.0–15.0)
Immature Granulocytes: 0 %
Lymphocytes Relative: 30 %
Lymphs Abs: 2.2 10*3/uL (ref 0.7–4.0)
MCH: 28.3 pg (ref 26.0–34.0)
MCHC: 32.4 g/dL (ref 30.0–36.0)
MCV: 87.5 fL (ref 80.0–100.0)
Monocytes Absolute: 0.5 10*3/uL (ref 0.1–1.0)
Monocytes Relative: 7 %
Neutro Abs: 4.3 10*3/uL (ref 1.7–7.7)
Neutrophils Relative %: 59 %
Platelet Count: 302 10*3/uL (ref 150–400)
RBC: 4.41 MIL/uL (ref 3.87–5.11)
RDW: 13.7 % (ref 11.5–15.5)
WBC Count: 7.2 10*3/uL (ref 4.0–10.5)
nRBC: 0 % (ref 0.0–0.2)

## 2022-11-19 LAB — CMP (CANCER CENTER ONLY)
ALT: 12 U/L (ref 0–44)
AST: 16 U/L (ref 15–41)
Albumin: 3.9 g/dL (ref 3.5–5.0)
Alkaline Phosphatase: 85 U/L (ref 38–126)
Anion gap: 14 (ref 5–15)
BUN: 15 mg/dL (ref 6–20)
CO2: 26 mmol/L (ref 22–32)
Calcium: 8.6 mg/dL — ABNORMAL LOW (ref 8.9–10.3)
Chloride: 99 mmol/L (ref 98–111)
Creatinine: 0.78 mg/dL (ref 0.44–1.00)
GFR, Estimated: >60 mL/min (ref >60)
Glucose, Bld: 105 mg/dL — ABNORMAL HIGH (ref 70–99)
Potassium: 3.2 mmol/L — ABNORMAL LOW (ref 3.5–5.1)
Sodium: 139 mmol/L (ref 135–145)
Total Bilirubin: 0.4 mg/dL (ref <1.2)
Total Protein: 7.4 g/dL (ref 6.5–8.1)

## 2022-11-19 LAB — CEA (IN HOUSE-CHCC): CEA (CHCC-In House): 2.62 ng/mL (ref 0.00–5.00)

## 2022-11-19 MED ORDER — SODIUM CHLORIDE 0.9% FLUSH
10.0000 mL | Freq: Once | INTRAVENOUS | Status: AC
Start: 2022-11-19 — End: 2022-11-19
  Administered 2022-11-19: 10 mL via INTRAVENOUS

## 2022-11-19 MED ORDER — HEPARIN SOD (PORK) LOCK FLUSH 100 UNIT/ML IV SOLN
500.0000 [IU] | Freq: Once | INTRAVENOUS | Status: AC
  Administered 2022-11-19: 500 [IU] via INTRAVENOUS

## 2022-11-19 MED ORDER — ALTEPLASE 2 MG IJ SOLR
2.0000 mg | Freq: Once | INTRAMUSCULAR | Status: DC
  Filled 2022-11-19: qty 2

## 2022-11-19 NOTE — Progress Notes (Signed)
Patient's port was accessed at 1500 with no blood return. Cathflow placed at 1520. 1544- no blood return.

## 2022-11-19 NOTE — Progress Notes (Signed)
Hematology and Oncology Follow Up Visit  Janice Lucas [id number] 1965/12/06 57 y.o. 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  She is doing okay.  She is under little bit of stress right now.  There are some issues going on with her social life.  Of note, she had her MRI of the rectum back in July.  Everything looked fine without any evidence of residual disease.  She has had no problems with bowels or bladder.  She has had no bleeding.  There has been no abdominal pain.  She has had no cough or shortness of breath.  She has not had COVID so this is a good thing.  There is been no leg swelling.  She has not been on Xarelto.  She doing well off the Xarelto.  Her last CEA level was 2.8.  Her appetite has been pretty good.  She does have a fever blister on her lower lip.  She has had no headache.  There is been no mouth sores.  Overall, I would say that her performance status is ECOG 0.   Medications:  Allergies as of 11/19/2022       Reactions   Sulfamethoxazole Other (See Comments)   Makes me loopy.   Neomycin-bacitracin Zn-polymyx Rash   Sulfa Antibiotics Other (See Comments)   Acts "goofy"        Medication List        Accurate as of November 19, 2022  3:31 PM. If you have any questions, ask your nurse or doctor.          STOP taking these medications    atorvastatin 20 MG tablet Commonly known as: LIPITOR Stopped by: Josph Macho       TAKE these medications    acetaminophen 500 MG tablet Commonly known as: TYLENOL Take 500 mg by mouth every 6  (six) hours as needed.   Biotin 40981 MCG Tabs Take by mouth daily.   CALCIUM 1000 + D PO Take by mouth 2 (two) times daily.   cholecalciferol 25 MCG (1000 UNIT) tablet Commonly known as: VITAMIN D3 Take 1,000 Units by mouth daily.   fluticasone 50 MCG/ACT nasal spray Commonly known as: FLONASE Place 2 sprays into both nostrils daily. What changed:  when to take this reasons to take this   losartan 50 MG tablet Commonly known as: COZAAR Take 1 tablet (50 mg total) by mouth daily.   magnesium 30 MG tablet Take 30 mg by mouth 2 (two) times daily.   metFORMIN 500 MG tablet Commonly known as: GLUCOPHAGE Take 1 tablet (500 mg total) by mouth daily with breakfast.   nortriptyline 50 MG capsule Commonly known as: PAMELOR Take 2 capsules (100 mg total) by mouth every night as directed   omeprazole 40 MG capsule Commonly known as: PRILOSEC Take 1 capsule (40 mg total) by mouth daily.   potassium chloride SA 20 MEQ tablet Commonly known as: KLOR-CON M Take 1 tablet (20 mEq total) by mouth 2 (two) times daily. What changed: when to take this   topiramate 100  MG tablet Commonly known as: TOPAMAX Take 1 tablet by mouth 2  times daily.   vitamin C 1000 MG tablet Take 1,000 mg by mouth daily.   vitamin E 1000 UNIT capsule Take 1,000 Units by mouth daily.        Allergies:  Allergies  Allergen Reactions   Sulfamethoxazole Other (See Comments)    Makes me loopy.   Neomycin-Bacitracin Zn-Polymyx Rash   Sulfa Antibiotics Other (See Comments)    Acts "goofy"    Past Medical History, Surgical history, Social history, and Family History were reviewed and updated.  Review of Systems: Review of Systems  Constitutional: Negative.   HENT: Negative.    Eyes: Negative.   Respiratory: Negative.    Cardiovascular: Negative.   Gastrointestinal: Negative.   Genitourinary: Negative.   Musculoskeletal: Negative.   Skin: Negative.   Neurological: Negative.    Endo/Heme/Allergies: Negative.   Psychiatric/Behavioral: Negative.       Physical Exam:  oral temperature is 97.8 F (36.6 C). Her blood pressure is 121/75 and her pulse is 88. Her respiration is 20 and oxygen saturation is 100%.   Wt Readings from Last 3 Encounters:  11/11/22 282 lb (127.9 kg)  10/22/22 286 lb 9.6 oz (130 kg)  10/21/22 282 lb (127.9 kg)   Physical Exam Vitals reviewed.  HENT:     Head: Normocephalic and atraumatic.  Eyes:     Pupils: Pupils are equal, round, and reactive to light.  Cardiovascular:     Rate and Rhythm: Normal rate and regular rhythm.     Heart sounds: Normal heart sounds.  Pulmonary:     Effort: Pulmonary effort is normal.     Breath sounds: Normal breath sounds.  Abdominal:     General: Bowel sounds are normal.     Palpations: Abdomen is soft.  Musculoskeletal:        General: No tenderness or deformity. Normal range of motion.     Cervical back: Normal range of motion.  Lymphadenopathy:     Cervical: No cervical adenopathy.  Skin:    General: Skin is warm and dry.     Findings: No erythema or rash.  Neurological:     Mental Status: She is alert and oriented to person, place, and time.  Psychiatric:        Behavior: Behavior normal.        Thought Content: Thought content normal.        Judgment: Judgment normal.       Lab Results  Component Value Date   WBC 8.3 08/14/2022   HGB 13.2 08/14/2022   HCT 41.1 08/14/2022   MCV 86.9 08/14/2022   PLT 308 08/14/2022   Lab Results  Component Value Date   FERRITIN 99 06/17/2021   IRON 48 06/17/2021   TIBC 311 06/17/2021   UIBC 263 06/17/2021   IRONPCTSAT 15 06/17/2021   Lab Results  Component Value Date   RBC 4.73 08/14/2022   No results found for: "KPAFRELGTCHN", "LAMBDASER", "KAPLAMBRATIO" No results found for: "IGGSERUM", "IGA", "IGMSERUM" No results found for: "TOTALPROTELP", "ALBUMINELP", "A1GS", "A2GS", "BETS", "BETA2SER", "GAMS", "MSPIKE", "SPEI"   Chemistry       Component Value Date/Time   NA 139 09/03/2022 1606   K 3.6 09/03/2022 1606   CL 102 09/03/2022 1606   CO2 28 09/03/2022 1606   BUN 16 09/03/2022 1606   CREATININE 0.86 09/03/2022 1606   CREATININE 0.88 08/14/2022 1358   CREATININE 0.84 11/15/2019 1516  Component Value Date/Time   CALCIUM 8.8 09/03/2022 1606   ALKPHOS 88 08/14/2022 1358   AST 14 (L) 08/14/2022 1358   ALT 13 08/14/2022 1358   BILITOT 0.4 08/14/2022 1358       Impression and Plan: Janice Lucas is a very charming 57 yo white female with localized rectal cancer, stage IIA.   We tried her on neoadjuvant chemotherapy which she tolerated incredibly poorly.  As such, we finally did get her to surgery.  She had no residual disease at surgery.  We will still get an MRI.  I think this can be important.  He had mentioned that she did have a colonoscopy.  Everything looks fine on the colonoscopy.  She does not need another 1 for 3 years.  I am just happy that she is doing so well.  I forgot to mention that her Port-A-Cath is having some difficulty.  If we are having more trouble with the Port-A-Cath, we can always get this taken out.  Again, we will get the MRI set up for her.  Will set up for the end of the year.  I will plan to see her back in about 4 months.   Josph Macho, MD 11/6/20243:31 PM

## 2022-11-20 ENCOUNTER — Encounter: Payer: Self-pay | Admitting: Hematology & Oncology

## 2022-11-20 ENCOUNTER — Ambulatory Visit (HOSPITAL_BASED_OUTPATIENT_CLINIC_OR_DEPARTMENT_OTHER): Payer: BC Managed Care – PPO | Admitting: Student

## 2022-11-28 ENCOUNTER — Encounter: Payer: Self-pay | Admitting: Family Medicine

## 2022-11-28 DIAGNOSIS — I10 Essential (primary) hypertension: Secondary | ICD-10-CM

## 2022-11-29 ENCOUNTER — Other Ambulatory Visit (HOSPITAL_COMMUNITY): Payer: Self-pay

## 2022-11-29 MED ORDER — HYDROCHLOROTHIAZIDE 25 MG PO TABS
25.0000 mg | ORAL_TABLET | Freq: Every day | ORAL | 3 refills | Status: DC
  Filled 2022-11-29: qty 90, 90d supply, fill #0
  Filled 2023-03-08: qty 90, 90d supply, fill #1
  Filled 2023-06-07: qty 90, 90d supply, fill #2
  Filled 2023-09-11: qty 90, 90d supply, fill #3

## 2022-12-02 ENCOUNTER — Other Ambulatory Visit (HOSPITAL_COMMUNITY): Payer: Self-pay

## 2022-12-02 ENCOUNTER — Ambulatory Visit (HOSPITAL_BASED_OUTPATIENT_CLINIC_OR_DEPARTMENT_OTHER)
Admission: RE | Admit: 2022-12-02 | Discharge: 2022-12-02 | Disposition: A | Discharge status: Home / Self Care | Payer: BC Managed Care – PPO | Source: Ambulatory Visit | Attending: Family Medicine | Admitting: Family Medicine

## 2022-12-02 DIAGNOSIS — N838 Other noninflammatory disorders of ovary, fallopian tube and broad ligament: Secondary | ICD-10-CM | POA: Diagnosis not present

## 2022-12-02 DIAGNOSIS — K432 Incisional hernia without obstruction or gangrene: Secondary | ICD-10-CM | POA: Diagnosis not present

## 2022-12-02 DIAGNOSIS — N8003 Adenomyosis of the uterus: Secondary | ICD-10-CM | POA: Insufficient documentation

## 2022-12-06 ENCOUNTER — Other Ambulatory Visit (HOSPITAL_COMMUNITY): Payer: Self-pay

## 2022-12-10 ENCOUNTER — Other Ambulatory Visit (HOSPITAL_COMMUNITY): Payer: Self-pay

## 2022-12-10 ENCOUNTER — Encounter: Payer: Self-pay | Admitting: Family Medicine

## 2022-12-10 DIAGNOSIS — N838 Other noninflammatory disorders of ovary, fallopian tube and broad ligament: Secondary | ICD-10-CM

## 2022-12-29 ENCOUNTER — Other Ambulatory Visit: Payer: Self-pay

## 2022-12-29 ENCOUNTER — Other Ambulatory Visit (HOSPITAL_COMMUNITY): Payer: Self-pay

## 2023-01-01 ENCOUNTER — Other Ambulatory Visit (HOSPITAL_COMMUNITY): Payer: Self-pay

## 2023-01-01 DIAGNOSIS — Z6841 Body Mass Index (BMI) 40.0 and over, adult: Secondary | ICD-10-CM | POA: Diagnosis not present

## 2023-01-01 DIAGNOSIS — E66813 Obesity, class 3: Secondary | ICD-10-CM | POA: Diagnosis not present

## 2023-01-01 DIAGNOSIS — K432 Incisional hernia without obstruction or gangrene: Secondary | ICD-10-CM | POA: Diagnosis not present

## 2023-01-01 MED ORDER — LOMAIRA 8 MG PO TABS
8.0000 mg | ORAL_TABLET | Freq: Every morning | ORAL | 0 refills | Status: DC
  Filled 2023-01-01: qty 30, 30d supply, fill #0

## 2023-01-02 ENCOUNTER — Encounter: Payer: Self-pay | Admitting: *Deleted

## 2023-01-02 ENCOUNTER — Encounter: Payer: Self-pay | Admitting: Family Medicine

## 2023-01-02 ENCOUNTER — Other Ambulatory Visit: Payer: Self-pay | Admitting: *Deleted

## 2023-01-02 ENCOUNTER — Other Ambulatory Visit: Payer: Self-pay

## 2023-01-02 ENCOUNTER — Other Ambulatory Visit (HOSPITAL_COMMUNITY): Payer: Self-pay

## 2023-01-02 DIAGNOSIS — E66813 Obesity, class 3: Secondary | ICD-10-CM

## 2023-01-02 DIAGNOSIS — R197 Diarrhea, unspecified: Secondary | ICD-10-CM

## 2023-01-02 DIAGNOSIS — E785 Hyperlipidemia, unspecified: Secondary | ICD-10-CM

## 2023-01-02 DIAGNOSIS — J309 Allergic rhinitis, unspecified: Secondary | ICD-10-CM

## 2023-01-02 DIAGNOSIS — Z6841 Body Mass Index (BMI) 40.0 and over, adult: Secondary | ICD-10-CM

## 2023-01-02 DIAGNOSIS — R300 Dysuria: Secondary | ICD-10-CM

## 2023-01-02 DIAGNOSIS — G43009 Migraine without aura, not intractable, without status migrainosus: Secondary | ICD-10-CM

## 2023-01-02 DIAGNOSIS — Z7189 Other specified counseling: Secondary | ICD-10-CM

## 2023-01-02 DIAGNOSIS — E876 Hypokalemia: Secondary | ICD-10-CM

## 2023-01-02 DIAGNOSIS — I10 Essential (primary) hypertension: Secondary | ICD-10-CM

## 2023-01-02 DIAGNOSIS — Z95828 Presence of other vascular implants and grafts: Secondary | ICD-10-CM

## 2023-01-02 DIAGNOSIS — I743 Embolism and thrombosis of arteries of the lower extremities: Secondary | ICD-10-CM

## 2023-01-02 DIAGNOSIS — C20 Malignant neoplasm of rectum: Secondary | ICD-10-CM

## 2023-01-02 MED ORDER — ATORVASTATIN CALCIUM 20 MG PO TABS
20.0000 mg | ORAL_TABLET | Freq: Every day | ORAL | 3 refills | Status: DC
  Filled 2023-01-02: qty 90, 90d supply, fill #0
  Filled 2023-03-22: qty 90, 90d supply, fill #1
  Filled 2023-07-12: qty 90, 90d supply, fill #2

## 2023-01-05 ENCOUNTER — Inpatient Hospital Stay: Payer: BC Managed Care – PPO | Attending: Hematology & Oncology

## 2023-01-05 DIAGNOSIS — Z85048 Personal history of other malignant neoplasm of rectum, rectosigmoid junction, and anus: Secondary | ICD-10-CM | POA: Diagnosis not present

## 2023-01-05 DIAGNOSIS — Z452 Encounter for adjustment and management of vascular access device: Secondary | ICD-10-CM | POA: Diagnosis not present

## 2023-01-05 NOTE — Patient Instructions (Signed)

## 2023-01-08 ENCOUNTER — Ambulatory Visit (HOSPITAL_COMMUNITY): Payer: BC Managed Care – PPO

## 2023-01-08 DIAGNOSIS — K432 Incisional hernia without obstruction or gangrene: Secondary | ICD-10-CM | POA: Diagnosis not present

## 2023-01-09 ENCOUNTER — Ambulatory Visit
Admission: RE | Admit: 2023-01-09 | Discharge: 2023-01-09 | Disposition: A | Discharge status: Home / Self Care | Payer: BC Managed Care – PPO | Source: Ambulatory Visit | Attending: Hematology & Oncology | Admitting: Hematology & Oncology

## 2023-01-09 DIAGNOSIS — C20 Malignant neoplasm of rectum: Secondary | ICD-10-CM

## 2023-01-09 DIAGNOSIS — Z7189 Other specified counseling: Secondary | ICD-10-CM

## 2023-01-09 DIAGNOSIS — Z6841 Body Mass Index (BMI) 40.0 and over, adult: Secondary | ICD-10-CM

## 2023-01-09 DIAGNOSIS — G43009 Migraine without aura, not intractable, without status migrainosus: Secondary | ICD-10-CM

## 2023-01-09 DIAGNOSIS — Z95828 Presence of other vascular implants and grafts: Secondary | ICD-10-CM

## 2023-01-09 DIAGNOSIS — R300 Dysuria: Secondary | ICD-10-CM

## 2023-01-09 DIAGNOSIS — J309 Allergic rhinitis, unspecified: Secondary | ICD-10-CM

## 2023-01-09 DIAGNOSIS — I743 Embolism and thrombosis of arteries of the lower extremities: Secondary | ICD-10-CM

## 2023-01-09 DIAGNOSIS — I10 Essential (primary) hypertension: Secondary | ICD-10-CM

## 2023-01-09 DIAGNOSIS — E876 Hypokalemia: Secondary | ICD-10-CM

## 2023-01-09 DIAGNOSIS — R197 Diarrhea, unspecified: Secondary | ICD-10-CM

## 2023-01-13 ENCOUNTER — Other Ambulatory Visit (HOSPITAL_COMMUNITY): Payer: Self-pay

## 2023-01-13 ENCOUNTER — Ambulatory Visit: Payer: BC Managed Care – PPO | Admitting: Medical

## 2023-01-13 VITALS — BP 118/70 | HR 94 | Resp 18 | Ht 67.0 in | Wt 293.0 lb

## 2023-01-13 DIAGNOSIS — M25551 Pain in right hip: Secondary | ICD-10-CM

## 2023-01-13 DIAGNOSIS — M5440 Lumbago with sciatica, unspecified side: Secondary | ICD-10-CM | POA: Diagnosis not present

## 2023-01-13 MED ORDER — METHYLPREDNISOLONE 4 MG PO TBPK
ORAL_TABLET | ORAL | 0 refills | Status: DC
  Filled 2023-01-13: qty 21, 6d supply, fill #0

## 2023-01-13 NOTE — Progress Notes (Signed)
   Subjective:    Patient ID: Janice Lucas, female    DOB: 01-16-1965, 57 y.o.   MRN: [id number]  HPI Discussed the use of AI scribe software for clinical note transcription with the patient, who gave verbal consent to proceed.  History of Present Illness   The patient, with a history of rectal cancer and prediabetes, presented with new onset lower back and right hip pain. The pain began after an MRI scan for cancer follow-up, during which the patient experienced discomfort while lying on the table. The pain was noted to be worse upon getting off the table and has persisted since. The pain is described as severe, rating an 8 or 9 out of 10 in the mornings, and then leveling off during the day. The patient reported some improvement, with the pain decreasing to a 6 or 7 out of 10 at the time of the consultation.  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 There was no prior history of hip pain before the MRI scan. The patient has been managing the pain with Tylenol , which has been causing some stomach discomfort. The patient is also on metformin  for prediabetes.        Review of Systems See hpi    Objective:   Physical Exam  General Appearance- Not in acute distress.    Chest and Lung Exam Auscultation: Breath sounds:-Normal. Clear even and unlabored. Adventitious sounds:- No Adventitious sounds.  Cardiovascular Auscultation:Rythm - Regular, rate and rythm. Heart Sounds -Normal heart sounds.  Abdomen Inspection:-Inspection Normal.  Palpation/Perucssion: Palpation and Percussion of the abdomen reveal- Non Tender, No Rebound tenderness, No rigidity(Guarding) and No Palpable abdominal masses.  Liver:-Normal.  Spleen:- Normal.   Back No Mid lumbar spine tenderness to palpation. But moderate direct tender over rt si area Pain on straight leg lift. Pain on lateral movements and flexion/extension  of the spine.  Lower ext neurologic  L5-S1 sensation intact bilaterally. Normal patellar reflexes bilaterally. No foot drop bilaterally.   Rt hip- mild tender to palpation and rom.      Assessment & Plan:   Assessment and Plan    Lower Back and Right Hip Pain Acute onset following MRI for rectal cancer surveillance. Pain radiates to the right hip, worse in the morning (8-9/10), improving to 6-7/10 during the day. 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 -Start Medrol  taper over 6 days, with caution regarding potential hyperglycemia. -Advise patient to check blood glucose daily and maintain a low sugar diet.(Relion glucometer otc brand can get at [facility]). -If blood glucose exceeds 200, patient to notify the office. -Print out back stretching exercises for patient to perform as tolerated. -If pain persists beyond a week, order X-ray of lumbar spine and right hip. -Review MRI report when available for incidental findings related to back and hip pain.  Prediabetes Patient on Metformin , blood sugars slightly elevated. -Continue Metformin . -Advise patient to maintain a low sugar diet. -Check blood glucose daily, especially while on Medrol .   Follow up date to be determined.  Ask you update me by my chart in 6 days as to how you are doing. Sooner up date if needed.

## 2023-01-13 NOTE — Patient Instructions (Signed)
 Lower Back and Right Hip Pain Acute onset following MRI for rectal cancer surveillance. Pain radiates to the right hip, worse in the morning (8-9/10), improving to 6-7/10 during the day. 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 -Start Medrol  taper over 6 days, with caution regarding potential hyperglycemia. -Advise patient to check blood glucose daily and maintain a low sugar diet.(Relion glucometer otc brand can get at [facility]). -If blood glucose exceeds 200, patient to notify the office. -Print out back stretching exercises for patient to perform as tolerated. -If pain persists beyond a week, order X-ray of lumbar spine and right hip. -Review MRI report when available for incidental findings related to back and hip pain.  Prediabetes Patient on Metformin , blood sugars slightly elevated. -Continue Metformin . -Advise patient to maintain a low sugar diet. -Check blood glucose daily, especially while on Medrol .  Follow up date to be determined.  Ask you update me by my chart in 6 days as to how you are doing. Sooner up date if needed.  Back Exercises These exercises help to make your trunk and back strong. They also help to keep the lower back flexible. Doing these exercises can help to prevent or lessen pain in your lower back. If you have back pain, try to do these exercises 2-3 times each day or as told by your doctor. As you get better, do the exercises once each day. Repeat the exercises more often as told by your doctor. To stop back pain from coming back, do the exercises once each day, or as told by your doctor. Do exercises exactly as told by your doctor. Stop right away if you feel sudden pain or your pain gets worse. Exercises Single knee to chest Do these steps 3-5 times in a row for each leg: Lie on your back on a firm bed or the floor with your legs stretched out. Bring one knee to your chest. Grab your knee or thigh with both hands and  hold it in place. Pull on your knee until you feel a gentle stretch in your lower back or butt. Keep doing the stretch for 10-30 seconds. Slowly let go of your leg and straighten it. Pelvic tilt Do these steps 5-10 times in a row: Lie on your back on a firm bed or the floor with your legs stretched out. Bend your knees so they point up to the ceiling. Your feet should be flat on the floor. Tighten your lower belly (abdomen) muscles to press your lower back against the floor. This will make your tailbone point up to the ceiling instead of pointing down to your feet or the floor. Stay in this position for 5-10 seconds while you gently tighten your muscles and breathe evenly. Cat-cow Do these steps until your lower back bends more easily: Get on your hands and knees on a firm bed or the floor. Keep your hands under your shoulders, and keep your knees under your hips. You may put padding under your knees. Let your head hang down toward your chest. Tighten (contract) the muscles in your belly. Point your tailbone toward the floor so your lower back becomes rounded like the back of a cat. Stay in this position for 5 seconds. Slowly lift your head. Let the muscles of your belly relax. Point your tailbone up toward the ceiling so your back forms a sagging arch like the back of a cow. Stay in this position for 5 seconds.  Press-ups Do these steps 5-10  times in a row: Lie on your belly (face-down) on a firm bed or the floor. Place your hands near your head, about shoulder-width apart. While you keep your back relaxed and keep your hips on the floor, slowly straighten your arms to raise the top half of your body and lift your shoulders. Do not use your back muscles. You may change where you place your hands to make yourself more comfortable. Stay in this position for 5 seconds. Keep your back relaxed. Slowly return to lying flat on the floor.  Bridges Do these steps 10 times in a row: Lie on your  back on a firm bed or the floor. Bend your knees so they point up to the ceiling. Your feet should be flat on the floor. Your arms should be flat at your sides, next to your body. Tighten your butt muscles and lift your butt off the floor until your waist is almost as high as your knees. If you do not feel the muscles working in your butt and the back of your thighs, slide your feet 1-2 inches (2.5-5 cm) farther away from your butt. Stay in this position for 3-5 seconds. Slowly lower your butt to the floor, and let your butt muscles relax. If this exercise is too easy, try doing it with your arms crossed over your chest. Belly crunches Do these steps 5-10 times in a row: Lie on your back on a firm bed or the floor with your legs stretched out. Bend your knees so they point up to the ceiling. Your feet should be flat on the floor. Cross your arms over your chest. Tip your chin a little bit toward your chest, but do not bend your neck. Tighten your belly muscles and slowly raise your chest just enough to lift your shoulder blades a tiny bit off the floor. Avoid raising your body higher than that because it can put too much stress on your lower back. Slowly lower your chest and your head to the floor. Back lifts Do these steps 5-10 times in a row: Lie on your belly (face-down) with your arms at your sides, and rest your forehead on the floor. Tighten the muscles in your legs and your butt. Slowly lift your chest off the floor while you keep your hips on the floor. Keep the back of your head in line with the curve in your back. Look at the floor while you do this. Stay in this position for 3-5 seconds. Slowly lower your chest and your face to the floor. Contact a doctor if: Your back pain gets a lot worse when you do an exercise. Your back pain does not get better within 2 hours after you exercise. If you have any of these problems, stop doing the exercises. Do not do them again unless your  doctor says it is okay. Get help right away if: You have sudden, very bad back pain. If this happens, stop doing the exercises. Do not do them again unless your doctor says it is okay. This information is not intended to replace advice given to you by your health care provider. Make sure you discuss any questions you have with your health care provider. Document Revised: 03/14/2020 Document Reviewed: 03/14/2020 Elsevier Patient Education  2024 [employer].

## 2023-01-22 ENCOUNTER — Ambulatory Visit: Payer: BC Managed Care – PPO | Admitting: Family Medicine

## 2023-01-22 ENCOUNTER — Other Ambulatory Visit: Payer: Self-pay

## 2023-01-22 ENCOUNTER — Inpatient Hospital Stay (HOSPITAL_BASED_OUTPATIENT_CLINIC_OR_DEPARTMENT_OTHER): Payer: BC Managed Care – PPO | Admitting: Hematology & Oncology

## 2023-01-22 ENCOUNTER — Ambulatory Visit (HOSPITAL_BASED_OUTPATIENT_CLINIC_OR_DEPARTMENT_OTHER)
Admission: RE | Admit: 2023-01-22 | Discharge: 2023-01-22 | Disposition: A | Discharge status: Home / Self Care | Payer: BC Managed Care – PPO | Source: Ambulatory Visit | Attending: Family Medicine | Admitting: Family Medicine

## 2023-01-22 ENCOUNTER — Encounter: Payer: Self-pay | Admitting: Hematology & Oncology

## 2023-01-22 ENCOUNTER — Inpatient Hospital Stay: Payer: BC Managed Care – PPO | Attending: Hematology & Oncology

## 2023-01-22 ENCOUNTER — Inpatient Hospital Stay: Payer: BC Managed Care – PPO

## 2023-01-22 ENCOUNTER — Other Ambulatory Visit (HOSPITAL_BASED_OUTPATIENT_CLINIC_OR_DEPARTMENT_OTHER): Payer: Self-pay

## 2023-01-22 ENCOUNTER — Encounter: Payer: Self-pay | Admitting: Family Medicine

## 2023-01-22 VITALS — BP 125/79 | HR 86 | Temp 98.4°F | Resp 18 | Ht 66.0 in | Wt 288.4 lb

## 2023-01-22 VITALS — BP 154/74 | HR 85 | Temp 98.0°F | Resp 18 | Ht 66.0 in | Wt 289.0 lb

## 2023-01-22 DIAGNOSIS — C20 Malignant neoplasm of rectum: Secondary | ICD-10-CM

## 2023-01-22 DIAGNOSIS — R062 Wheezing: Secondary | ICD-10-CM | POA: Diagnosis not present

## 2023-01-22 DIAGNOSIS — R059 Cough, unspecified: Secondary | ICD-10-CM | POA: Diagnosis not present

## 2023-01-22 DIAGNOSIS — J069 Acute upper respiratory infection, unspecified: Secondary | ICD-10-CM | POA: Diagnosis not present

## 2023-01-22 DIAGNOSIS — R0789 Other chest pain: Secondary | ICD-10-CM | POA: Diagnosis not present

## 2023-01-22 DIAGNOSIS — Z981 Arthrodesis status: Secondary | ICD-10-CM | POA: Diagnosis not present

## 2023-01-22 DIAGNOSIS — Z85048 Personal history of other malignant neoplasm of rectum, rectosigmoid junction, and anus: Secondary | ICD-10-CM | POA: Insufficient documentation

## 2023-01-22 LAB — CMP (CANCER CENTER ONLY)
ALT: 13 U/L (ref 0–44)
AST: 17 U/L (ref 15–41)
Albumin: 4.1 g/dL (ref 3.5–5.0)
Alkaline Phosphatase: 72 U/L (ref 38–126)
Anion gap: 10 (ref 5–15)
BUN: 16 mg/dL (ref 6–20)
CO2: 27 mmol/L (ref 22–32)
Calcium: 8.4 mg/dL — ABNORMAL LOW (ref 8.9–10.3)
Chloride: 98 mmol/L (ref 98–111)
Creatinine: 0.86 mg/dL (ref 0.44–1.00)
GFR, Estimated: >60 mL/min (ref >60)
Glucose, Bld: 98 mg/dL (ref 70–99)
Potassium: 3 mmol/L — ABNORMAL LOW (ref 3.5–5.1)
Sodium: 135 mmol/L (ref 135–145)
Total Bilirubin: 0.4 mg/dL (ref 0.0–1.2)
Total Protein: 7.4 g/dL (ref 6.5–8.1)

## 2023-01-22 LAB — CBC WITH DIFFERENTIAL (CANCER CENTER ONLY)
Abs Immature Granulocytes: 0.01 10*3/uL (ref 0.00–0.07)
Basophils Absolute: 0 10*3/uL (ref 0.0–0.1)
Basophils Relative: 1 %
Eosinophils Absolute: 0.4 10*3/uL (ref 0.0–0.5)
Eosinophils Relative: 7 %
HCT: 38.8 % (ref 36.0–46.0)
Hemoglobin: 12.9 g/dL (ref 12.0–15.0)
Immature Granulocytes: 0 %
Lymphocytes Relative: 25 %
Lymphs Abs: 1.5 10*3/uL (ref 0.7–4.0)
MCH: 28.2 pg (ref 26.0–34.0)
MCHC: 33.2 g/dL (ref 30.0–36.0)
MCV: 84.9 fL (ref 80.0–100.0)
Monocytes Absolute: 0.6 10*3/uL (ref 0.1–1.0)
Monocytes Relative: 10 %
Neutro Abs: 3.5 10*3/uL (ref 1.7–7.7)
Neutrophils Relative %: 57 %
Platelet Count: 245 10*3/uL (ref 150–400)
RBC: 4.57 MIL/uL (ref 3.87–5.11)
RDW: 14.2 % (ref 11.5–15.5)
WBC Count: 6.1 10*3/uL (ref 4.0–10.5)
nRBC: 0 % (ref 0.0–0.2)

## 2023-01-22 LAB — POCT INFLUENZA A/B
Influenza A, POC: NEGATIVE
Influenza B, POC: NEGATIVE

## 2023-01-22 LAB — CEA (ACCESS): CEA (CHCC): 2.41 ng/mL (ref 0.00–5.00)

## 2023-01-22 LAB — POC COVID19 BINAXNOW: SARS Coronavirus 2 Ag: NEGATIVE

## 2023-01-22 MED ORDER — HYDROCODONE BIT-HOMATROP MBR 5-1.5 MG/5ML PO SOLN
5.0000 mL | Freq: Three times a day (TID) | ORAL | 0 refills | Status: AC | PRN
  Filled 2023-01-22: qty 90, 6d supply, fill #0

## 2023-01-22 MED ORDER — DOXYCYCLINE HYCLATE 100 MG PO CAPS
100.0000 mg | ORAL_CAPSULE | Freq: Two times a day (BID) | ORAL | 0 refills | Status: DC
  Filled 2023-01-22: qty 20, 10d supply, fill #0

## 2023-01-22 NOTE — Progress Notes (Signed)
 Hematology and Oncology Follow Up Visit  Janice Lucas [id number] Feb 28, 1965 58 y.o. 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  We last saw our back in November.  Since then, she did have an MRI of the pelvis.  This is to assess for rectal cancer.  I think this was done on 01/09/2023.  There is no evidence of recurrent rectal cancer.  Her last CEA level was 2.62.  Her potassium is still on the low side.  She had a potassium level of 3.0.  I told her to take 40 mEq twice a day for 7 days and then go back to 20 mEq twice a day.  She is not going to have hernia surgery for the abdominal wall hernia.  She needs to lose little bit of weight according to her surgeon.  She apparently was given a pill by the surgeon.  I think this is Phentermine .   She has a bit of a upper respiratory tract infection.  She is going to stay off from work tomorrow.  She has had no problems with bowels or bladder.  There is no bleeding.  She has had no leg swelling.  There is been no rashes.  Overall, I would say that her performance status is probably ECOG 1.   .   Medications:  Allergies as of 01/22/2023       Reactions   Sulfamethoxazole Other (See Comments)   Makes me loopy.   Neomycin-bacitracin  Zn-polymyx Rash   Sulfa Antibiotics Other (See Comments)   Acts goofy        Medication List        Accurate as of January 22, 2023  3:55 PM. If you have any questions, ask your nurse or doctor.          STOP taking these medications    methylPREDNISolone  4 MG Tbpk tablet Commonly known as:  Medrol  Stopped by: Maude JONELLE Crease       TAKE these medications    acetaminophen  500 MG tablet Commonly known as: TYLENOL  Take 500 mg by mouth every 6 (six) hours as needed.   atorvastatin  20 MG tablet Commonly known as: LIPITOR Take 1 tablet  by mouth daily.   Biotin 10000 MCG Tabs Take by mouth daily.   CALCIUM  1000 + D PO Take by mouth 2 (two) times daily.   cholecalciferol 25 MCG (1000 UNIT) tablet Commonly known as: VITAMIN D3 Take 1,000 Units by mouth daily.   doxycycline  100 MG capsule Commonly known as: VIBRAMYCIN  Take 1 capsule (100 mg total) by mouth 2 (two) times daily. Started by: Jessica Copland   fluticasone  50 MCG/ACT nasal spray Commonly known as: FLONASE  Place 2 sprays into both nostrils daily. What changed:  when to take this reasons to take this   hydrochlorothiazide  25 MG tablet Commonly known as: HYDRODIURIL  Take 1 tablet (25 mg total) by mouth daily.   HYDROcodone  bit-homatropine 5-1.5 MG/5ML syrup Commonly known as: HYCODAN Take 5 mLs by mouth every 8 (eight) hours as needed for up to 5 days for cough. Started by: Harlene  Copland   Lomaira  8 MG Tabs Generic drug: Phentermine  HCl Take 1 tablet (8 mg total) by mouth every morning.   losartan  50 MG tablet Commonly known as: COZAAR  Take 1 tablet (50 mg total) by mouth daily.   magnesium 30 MG tablet Take 30 mg by mouth 2 (two) times daily.   metFORMIN  500 MG tablet Commonly known as: GLUCOPHAGE  Take 1 tablet (500 mg total) by mouth daily with breakfast.   nortriptyline  50 MG capsule Commonly known as: PAMELOR  Take 2 capsules (100 mg total) by mouth every night as directed   omeprazole  40 MG capsule Commonly known as: PRILOSEC Take 1 capsule (40 mg total) by mouth daily.   potassium chloride  SA 20 MEQ tablet Commonly known as: KLOR-CON  M Take 1 tablet (20 mEq total) by mouth 2 (two) times daily. What changed: when to take this   topiramate  100 MG tablet Commonly known as:  TOPAMAX  Take 1 tablet by mouth 2  times daily.   vitamin C 1000 MG tablet Take 1,000 mg by mouth daily.   vitamin E 1000 UNIT capsule Take 1,000 Units by mouth daily.        Allergies:  Allergies  Allergen Reactions   Sulfamethoxazole Other (See Comments)    Makes me loopy.   Neomycin-Bacitracin  Zn-Polymyx Rash   Sulfa Antibiotics Other (See Comments)    Acts goofy    Past Medical History, Surgical history, Social history, and Family History were reviewed and updated.  Review of Systems: Review of Systems  Constitutional: Negative.   HENT: Negative.    Eyes: Negative.   Respiratory: Negative.    Cardiovascular: Negative.   Gastrointestinal: Negative.   Genitourinary: Negative.   Musculoskeletal: Negative.   Skin: Negative.   Neurological: Negative.   Endo/Heme/Allergies: Negative.   Psychiatric/Behavioral: Negative.       Physical Exam:  height is 5' 6 (1.676 m) and weight is 288 lb 6.4 oz (130.8 kg). Her oral temperature is 98.4 F (36.9 C). Her blood pressure is 125/79 and her pulse is 86. Her respiration is 18 and oxygen saturation is 99%.   Wt Readings from Last 3 Encounters:  01/22/23 288 lb 6.4 oz (130.8 kg)  01/22/23 289 lb (131.1 kg)  01/13/23 293 lb (132.9 kg)   Physical Exam Vitals reviewed.  HENT:     Head: Normocephalic and atraumatic.  Eyes:     Pupils: Pupils are equal, round, and reactive to light.  Cardiovascular:     Rate and Rhythm: Normal rate and regular rhythm.     Heart sounds: Normal heart sounds.  Pulmonary:     Effort: Pulmonary effort is normal.     Breath sounds: Normal breath sounds.  Abdominal:     General: Bowel sounds are normal.     Palpations: Abdomen is soft.  Musculoskeletal:        General: No tenderness or deformity. Normal range of motion.     Cervical back: Normal range of motion.  Lymphadenopathy:     Cervical: No cervical adenopathy.  Skin:    General: Skin is warm and dry.     Findings: No erythema  or rash.  Neurological:     Mental Status: She is alert and oriented to person, place, and time.  Psychiatric:        Behavior: Behavior normal.        Thought Content: Thought content normal.        Judgment: Judgment normal.       Lab Results  Component Value Date   WBC 6.1 01/22/2023   HGB 12.9 01/22/2023   HCT 38.8 01/22/2023   MCV 84.9 01/22/2023   PLT 245 01/22/2023   Lab Results  Component Value Date   FERRITIN 99 06/17/2021   IRON 48 06/17/2021   TIBC 311 06/17/2021   UIBC 263 06/17/2021   IRONPCTSAT 15 06/17/2021   Lab Results  Component Value Date   RBC 4.57 01/22/2023   No results found for: KPAFRELGTCHN, LAMBDASER, KAPLAMBRATIO No results found for: IGGSERUM, IGA, IGMSERUM No results found for: STEPHANY CARLOTA BENSON MARKEL EARLA JOANNIE DOC VICK, SPEI   Chemistry      Component Value Date/Time   NA 139 11/19/2022 1558   K 3.2 (L) 11/19/2022 1558   CL 99 11/19/2022 1558   CO2 26 11/19/2022 1558   BUN 15 11/19/2022 1558   CREATININE 0.78 11/19/2022 1558   CREATININE 0.84 11/15/2019 1516      Component Value Date/Time   CALCIUM  8.6 (L) 11/19/2022 1558   ALKPHOS 85 11/19/2022 1558   AST 16 11/19/2022 1558   ALT 12 11/19/2022 1558   BILITOT 0.4 11/19/2022 1558       Impression and Plan: Janice Lucas is a very charming 58 yo white female with localized rectal cancer, stage IIA.   We tried her on neoadjuvant chemotherapy which she tolerated incredibly poorly.  As such, we finally did get her to surgery.  She had no residual disease at surgery.  I really do not think we need another MRI probably for about 4 months or so.  Again, the potassium was on the low side.  We are going to increase her potassium for a little bit.  I will plan to have her come back in 2 months.     Maude JONELLE Crease, MD 1/9/20253:55 PM

## 2023-01-22 NOTE — Progress Notes (Addendum)
 [facility] [address] [place], [place] [address] [phone]  Date:  01/22/2023   Name:  Janice Lucas   DOB:  [birth year]   MRN:  [id number]  PCP:  Watt Harlene BROCKS, MD    Chief Complaint: URI (Cough, sneezing, body aches x 4 days. No testing at home. Taking Tylenol )   History of Present Illness:  Janice Lucas is a 58 y.o. very pleasant female patient who presents with the following:  Pt seen today with concern of illness- sx onset on Sunday, today is Thursday Pt thought she had made an appt and walked in, we were able to see her  She feels like a weight on my chest, it's tight She is coughing a lot She is not sure if any fever She does notice body aches, no chills No vomiting or diarrhea She is bringing up some mucus  Pt with history of HTN, migraine HA, hyperlipidemia, intracranial hypertension/ pseudotumor cerebri dx in 2015, mild diabetes.  She had a colonoscopy in March 2023 and was diagnosed with localized rectal cancer  She saw her oncologist Dr. 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 is a very charming 58 yo white female with localized rectal cancer, stage IIA.  We tried her on neoadjuvant chemotherapy which she tolerated incredibly poorly.  As such, we finally did get her to surgery.  She had no residual disease at surgery. For right now, I think that we probably do not need another MRI probably until the end of the year.   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   Class 3 severe obesity due to excess calories with serious comorbidity and body mass index (BMI) of 45.0 to 49.9 in adult [facility]) 11/19/2022   Insufficiency fracture of medial femoral condyle (HCC) 04/17/2022   Degenerative tear of left medial meniscus 04/17/2022   Estrogen deficiency 04/17/2022   Rectal cancer (HCC) 04/26/2021   Goals of care, counseling/discussion 04/26/2021   Allergic rhinitis 05/15/2020   Hypertension 07/05/2018   Migraine without aura and without status migrainosus, not intractable 02/08/2016   Depression 10/10/2015   Injury by electrocution 10/10/2015   Mixed hyperlipidemia 10/10/2015   Prediabetes 10/10/2015   Urinary incontinence 10/10/2015   Sleep apnea 08/30/2015   IIH (idiopathic intracranial hypertension) 11/14/2013   Herniated nucleus pulposus, C6-7 right 02/14/2011    Past Medical History:  Diagnosis Date   Allergy    SEASONAL - MILD   Diabetes mellitus without complication (HCC)    diet controlled/with meds   GERD (gastroesophageal reflux disease)    ON OMEPRAZOLE    Goals of care, counseling/discussion 04/26/2021   Hyperlipidemia    ON MEDS   Hypertension    ON MEDS   Rectal cancer (HCC) 04/26/2021   Sleep apnea    WEARS CPAP    Past Surgical History:  Procedure Laterality Date   ANTERIOR CERVICAL DECOMP/DISCECTOMY  FUSION  02/14/2011   Procedure: ANTERIOR CERVICAL DECOMPRESSION/DISCECTOMY FUSION 1 LEVEL;  Surgeon: Victory JINNY Gens, MD;  Location: [facility];  Service: Neurosurgery;  Laterality: N/A;  Anterior Cervical Six-Seven Decompression and Fusion   COLON SURGERY  09/2021   COLON SURGERY  12/2021   IR IMAGING GUIDED PORT INSERTION  05/02/2021    Social History   Tobacco Use   Smoking status: Former    Current packs/day: 1.00    Average packs/day: 1 pack/day for 20.0 years (20.0 ttl pk-yrs)    Types: Cigarettes    Smokeless tobacco: Never   Tobacco comments:    VAPES   Vaping Use   Vaping status: Every Day   Substances: Nicotine, Flavoring  Substance Use Topics   Alcohol  use: No    Comment: occassionally   Drug use: Not Currently    Family History  Problem Relation Age of Onset   Colon polyps Father    Prostate cancer Father        METS TO LYMPH NODES AND BLADDER   Diabetes Maternal Aunt    Diabetes Maternal Uncle    Colon cancer Neg Hx    Esophageal cancer Neg Hx    Rectal cancer Neg Hx    Stomach cancer Neg Hx     Allergies  Allergen Reactions   Sulfamethoxazole Other (See Comments)    Makes me loopy.   Neomycin-Bacitracin  Zn-Polymyx Rash   Sulfa Antibiotics Other (See Comments)    Acts goofy    Medication list has been reviewed and updated.  Current Outpatient Medications on File Prior to Visit  Medication Sig Dispense Refill   acetaminophen  (TYLENOL ) 500 MG tablet Take 500 mg by mouth every 6 (six) hours as needed.     Ascorbic Acid (VITAMIN C) 1000 MG tablet Take 1,000 mg by mouth daily.     atorvastatin  (LIPITOR) 20 MG tablet Take 1 tablet  by mouth daily. 90 tablet 3   Biotin 89999 MCG TABS Take by mouth daily.     Calcium  Carb-Cholecalciferol (CALCIUM  1000 + D PO) Take by mouth 2 (two) times daily.     cholecalciferol (VITAMIN D3) 25 MCG (1000 UT) tablet Take 1,000 Units by mouth daily.     fluticasone  (FLONASE ) 50 MCG/ACT nasal spray Place 2 sprays into both nostrils daily. (Patient taking differently: Place 2 sprays into both nostrils daily as needed for allergies.) 16 g 6   hydrochlorothiazide  (HYDRODIURIL ) 25 MG tablet Take 1 tablet (25 mg total) by mouth daily. 90 tablet 3   losartan  (COZAAR ) 50 MG tablet Take 1 tablet (50 mg total) by mouth daily. 90 tablet 3   magnesium 30 MG tablet Take 30 mg by mouth 2 (two) times daily.     metFORMIN  (GLUCOPHAGE ) 500 MG tablet Take 1 tablet (500 mg total) by mouth daily with breakfast. 90 tablet 3   methylPREDNISolone   (MEDROL ) 4 MG TBPK tablet Take as directed on foil pack 21 tablet 0   nortriptyline  (PAMELOR ) 50 MG capsule Take 2 capsules (100 mg total) by mouth every night as directed 180 capsule 3   omeprazole  (PRILOSEC) 40 MG capsule Take 1 capsule (40 mg total) by mouth daily. 90 capsule 1   Phentermine  HCl (LOMAIRA ) 8 MG TABS Take 1 tablet (8 mg total) by mouth every morning. 30 tablet 0   potassium chloride  SA (KLOR-CON  M) 20 MEQ tablet Take 1 tablet (20 mEq total) by mouth 2 (two) times daily. (Patient taking differently: Take 20 mEq by mouth daily.)  60 tablet 6   topiramate  (TOPAMAX ) 100 MG tablet Take 1 tablet by mouth 2  times daily. 180 tablet 3   vitamin E 1000 UNIT capsule Take 1,000 Units by mouth daily.     [DISCONTINUED] prochlorperazine  (COMPAZINE ) 10 MG tablet Take 1 tablet (10 mg total) by mouth every 6 (six) hours as needed (Nausea or vomiting). 30 tablet 1   No current facility-administered medications on file prior to visit.    Review of Systems:  As per HPI- otherwise negative.   Physical Examination: Vitals:   01/22/23 1423  BP: (!) 154/74  Pulse: 85  Resp: 18  Temp: 98 F (36.7 C)  SpO2: 97%   Vitals:   01/22/23 1423  Weight: 289 lb (131.1 kg)  Height: 5' 6 (1.676 m)   Body mass index is 46.65 kg/m. Ideal Body Weight: Weight in (lb) to have BMI = 25: 154.6 Blood pressure was rechecked at hematology, significantly improved GEN: no acute distress.  Obese, looks well  HEENT: Atraumatic, Normocephalic.  Bilateral TM wnl, oropharynx normal.  PEERL,EOMI.  Ears and Nose: No external deformity. CV: RRR, No M/G/R. No JVD. No thrill. No extra heart sounds. PULM: CTA B, no wheezes, crackles, rhonchi. No retractions. No resp. distress. No accessory muscle use. ABD: S, NT, ND. No rebound. No HSM. EXTR: No c/c/e PSYCH: Normally interactive. Conversant.   EKG: SR with low voltage, esp in chest leads.  Compared with EKG from 6/23 no significant change is noted   Results  for orders placed or performed in visit on 01/22/23  POC COVID-19 BinaxNow   Collection Time: 01/22/23  2:56 PM  Result Value Ref Range   SARS Coronavirus 2 Ag Negative Negative  POCT Influenza A/B   Collection Time: 01/22/23  2:56 PM  Result Value Ref Range   Influenza A, POC Negative Negative   Influenza B, POC Negative Negative   BP Readings from Last 3 Encounters:  01/22/23 125/79  01/22/23 (!) 154/74  01/13/23 118/70    Assessment and Plan: Cough, unspecified type - Plan: POC COVID-19 BinaxNow, POCT Influenza A/B, DG Chest 2 View, doxycycline  (VIBRAMYCIN ) 100 MG capsule  Chest tightness - Plan: EKG 12-Lead  Pt seen today with concern of cough and chest feeling tight.  She has an appt with hematology this afternoon as well Her sx are most consistent with acute illness, but I offered to have her seen for a chest pain rule out for concern of tightness.  Pt feels the tightness she mentioned is due to her cough and declines ER at this time.  We will obtain a chest x-ray after her hematology visit today however to rule out pneumonia or effusion.  Assuming lungs are clear will treat with doxycyline and cough syrup as needed.  She is cautioned regarding sedation  Signed Harlene Schroeder, MD  Received chest film as follows, message to patient DG Chest 2 View Result Date: 01/22/2023 CLINICAL DATA:  Cough and wheezing. EXAM: CHEST - 2 VIEW COMPARISON:  10/07/2013. FINDINGS: Right-sided Port-A-Cath with tip over central SVC. No focal consolidation, pleural effusion, or pneumothorax. The cardiac silhouette is within normal limits. No acute osseous pathology. Lower cervical ACDF. IMPRESSION: No active cardiopulmonary disease. Electronically Signed   By: Vanetta Chou M.D.   On: 01/22/2023 16:46

## 2023-01-22 NOTE — Patient Instructions (Signed)
Implanted [place], Care After The following information offers guidance on how to care for yourself after your procedure. Your health care provider may also give you more specific instructions. If you have problems or questions, contact your health care provider. What can I expect after the procedure? After the procedure, it is common to have: Soreness or pain near your incision. Some swelling or bruising near your incision. Follow these instructions at home: Medicines Take over-the-counter and prescription medicines only as told by your health care provider. If you were prescribed an antibiotic medicine, take it as told by your health care provider. Do not stop taking the antibiotic even if you start to feel better. Bathing Do not take baths, swim, or use a hot tub until your health care provider approves. Ask your health care provider if you can take showers. You may only be allowed to take sponge baths. Incision care  Follow instructions from your health care provider about how to take care of your incision. Make sure you: Wash your hands with soap and water for at least 20 seconds before and after you change your bandage (dressing). If soap and water are not available, use hand sanitizer. Change your dressing as told by your health care provider. Keep your dressing dry. Leave stitches (sutures), skin glue, or adhesive strips in place. These skin closures may need to stay in place for 2 weeks or longer. If adhesive strip edges start to loosen and curl up, you may trim the loose edges. Do not remove adhesive strips completely unless your health care provider tells you to do that. Check your incision area every day for signs of infection. Check for: More redness, swelling, or pain. More fluid or blood. Warmth. Pus or a bad smell. Activity Return to your normal activities as told by your health care provider. Ask your health care provider what activities are safe for you. You may have  to avoid lifting. Ask your health care provider how much you can safely lift. Do not do activities that involve lifting your arms over your head. Driving  If you were given a sedative during the procedure, it can affect you for several hours. Do not drive or operate machinery until your health care provider says that it is safe. If you did not receive a sedative, ask your health care provider when it is safe to drive. General instructions Do not use any products that contain nicotine or tobacco. These products include cigarettes, chewing tobacco, and vaping devices, such as e-cigarettes. These can delay healing after surgery. If you need help quitting, ask your health care provider. Keep all follow-up visits. This is important. Contact a health care provider if: You have a fever or chills. You have more redness, swelling, or pain around your incision. You have more fluid or blood coming from your incision. Your incision feels warm to the touch. You have pus or a bad smell coming from your incision. You have pain that is not relieved by your pain medicine. Get help right away if: You have chest pain. You have difficulty breathing. These symptoms may be an emergency. Get help right away. Call 911. Do not wait to see if the symptoms will go away. Do not drive yourself to the hospital. Summary After the procedure, it is common to have pain, soreness, swelling, or bruising near your incision. If you were prescribed an antibiotic medicine, take it as told by your health care provider. Do not stop taking the antibiotic even if you   start to feel better. If you were given a sedative during the procedure, it can affect you for several hours. Do not drive or operate machinery until your health care provider says that it is safe. Return to your normal activities as told by your health care provider. Ask your health care provider what activities are safe for you. This information is not intended to  replace advice given to you by your health care provider. Make sure you discuss any questions you have with your health care provider. Document Revised: 07/03/2020 Document Reviewed: 07/03/2020 Elsevier Patient Education  2024 [employer]  

## 2023-01-22 NOTE — Patient Instructions (Addendum)
 Please stop at imaging on the way out for a chest x-ray Negative for covid and the flu I sent in an rx for doxycycline for your chest/ bronchitis  Cough syrup to use as needed for cough - do not use when you are driving

## 2023-01-26 ENCOUNTER — Telehealth (INDEPENDENT_AMBULATORY_CARE_PROVIDER_SITE_OTHER): Payer: BC Managed Care – PPO | Admitting: Neurology

## 2023-01-26 ENCOUNTER — Encounter: Payer: Self-pay | Admitting: Neurology

## 2023-01-26 VITALS — Ht 66.0 in | Wt 285.0 lb

## 2023-01-26 DIAGNOSIS — G932 Benign intracranial hypertension: Secondary | ICD-10-CM

## 2023-01-26 DIAGNOSIS — G43009 Migraine without aura, not intractable, without status migrainosus: Secondary | ICD-10-CM | POA: Diagnosis not present

## 2023-01-26 NOTE — Patient Instructions (Signed)
 Good to see you doing well. Continue Topiramate 100mg  twice a day and Nortriptyline 100mg  every night. Let me know where to send refills. Follow-up in 6-8 months, call for any changes.

## 2023-01-26 NOTE — Progress Notes (Signed)
 Virtual Visit via Video Note The purpose of this virtual visit is to provide medical care while limiting exposure to the novel coronavirus.    Consent was obtained for video visit:  Yes.   Answered questions that patient had about telehealth interaction:  Yes.   I discussed the limitations, risks, security and privacy concerns of performing an evaluation and management service by telemedicine. I also discussed with the patient that there may be a patient responsible charge related to this service. The patient expressed understanding and agreed to proceed.  Pt location: Home Physician Location: office Name of referring provider:  Copland, Harlene BROCKS, MD I connected with Janice Lucas at patients initiation/request on 01/26/2023 at  2:00 PM EST by video enabled telemedicine application and verified that I am speaking with the correct person using two identifiers. Pt MRN:  [id number] Pt DOB:  [birth year] Video Participants:  Janice Lucas   History of Present Illness:  The patient had a virtual video visit on 01/26/2023. She was last seen 7 months ago for pseudotumor cerebri and migraines. On her last visit, we discussed weaning off Diamox  due to continued hypokalemia. Headaches had been under control with no worsening off Diamox . No vision changes. Potassium remains low, 3.0 a few days ago. She has not seen her eye doctor. She has headaches every once in a while, they are not often. She is on Topiramate  100mg  BID and Nortriptyline  100mg  at bedtime for migraine prophylaxis. She usually gets 8 hours of sleep. Mood is good. 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 She tripped in October, no injuries. No loss of consciousness since 06/2021.    Chemistry      Component Value Date/Time   NA 135 01/22/2023 1522   K 3.0 (L) 01/22/2023 1522   CL 98 01/22/2023 1522   CO2 27 01/22/2023 1522   BUN 16 01/22/2023 1522   CREATININE 0.86 01/22/2023 1522   CREATININE 0.84 11/15/2019 1516       Component Value Date/Time   CALCIUM  8.4 (L) 01/22/2023 1522   ALKPHOS 72 01/22/2023 1522   AST 17 01/22/2023 1522   ALT 13 01/22/2023 1522   BILITOT 0.4 01/22/2023 1522       History on Initial Assessment 02/08/2016: This is a pleasant 58 yo RH woman with a history of pseudotumor cerebri diagnosed in 2015. Records from [facility] were reviewed, she presented to her eye doctor for a routine exam in April 2015 with a 58-month history of headaches thinking she may need new glasses. She was noted to have mild papilledema, right > left. At that time, headaches were behind her eyes and at the vertex, daily, worsened by bending, lifting, coughing, and sneezing. MRI and MRV brain were reported as normal. She had a lumbar puncture with opening pressure of 25 in the lateral position. They report problems with her LP, she had a post-spinal tap headache that initial blood patch did not help with. She was started on Topamax  and Diamox , and nortriptyline  was added for migraine prevention. She was doing very well on this regimen with rare headaches. Her neurologist Dr. Marlo left the practice and she saw Dr. Leopoldo last October, with note of improvement in eye exam from last visit, no evidence of papilledema, and rare headaches. They discussed reducing some of her medications, Topamax  was reduced to 100mg  daily. With reduction in Topamax , she noticed an increase in headaches, particularly in the morning. She then lost her insurance and was unable to  obtain the Diamox . Without this, headaches became much worse. She describes them as diffuse sharp pain, with occasional photo and phonophobia when severe, no nausea/vomiting. Bending over causes floaters in her vision. She has intermittent tinnitus (non-pulsatile). She feels her vision is getting worse, she is straining more to use the computer with blurry vision, no loss of vision. No driving difficulties with peripheral vision.    She has been back on the  Diamox  after seeing her PCP, currently on uptitration taking 500mg  BID, then increasing back to original dose of 1000mg  BID. She had some paresthesias on the Diamox , and found that bananas help with this. She denies any dysarthria/dysphagia, neck/back pain, bowel/bladder dysfunction, dizziness, no falls. She reports some numbness in the three fingers of her right hand, they feel cold suddenly (like ice cubes) and hurt. 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      Current Outpatient Medications on File Prior to Visit  Medication Sig Dispense Refill   acetaminophen  (TYLENOL ) 500 MG tablet Take 500 mg by mouth every 6 (six) hours as needed.     Ascorbic Acid (VITAMIN C) 1000 MG tablet Take 1,000 mg by mouth daily.     atorvastatin  (LIPITOR) 20 MG tablet Take 1 tablet  by mouth daily. 90 tablet 3   Biotin 89999 MCG TABS Take by mouth daily.     Calcium  Carb-Cholecalciferol (CALCIUM  1000 + D PO) Take by mouth 2 (two) times daily.     cholecalciferol (VITAMIN D3) 25 MCG (1000 UT) tablet Take 1,000 Units by mouth daily.     doxycycline  (VIBRAMYCIN ) 100 MG capsule Take 1 capsule (100 mg total) by mouth 2 (two) times daily. 20 capsule 0   fluticasone  (FLONASE ) 50 MCG/ACT nasal spray Place 2 sprays into both nostrils daily. (Patient taking differently: Place 2 sprays into both nostrils daily as needed for allergies.) 16 g 6   hydrochlorothiazide  (HYDRODIURIL ) 25 MG tablet Take 1 tablet (25 mg total) by mouth daily. 90 tablet 3   HYDROcodone  bit-homatropine (HYCODAN) 5-1.5 MG/5ML syrup Take 5 mLs by mouth every 8 (eight) hours as needed for up to 5 days for cough. 90 mL 0   losartan  (COZAAR ) 50 MG tablet Take 1 tablet (50 mg total) by mouth daily. 90 tablet 3   magnesium 30 MG tablet Take 30 mg by mouth 2 (two) times daily.     metFORMIN  (GLUCOPHAGE ) 500 MG tablet Take 1 tablet (500 mg total) by mouth daily with breakfast. 90 tablet 3   nortriptyline  (PAMELOR ) 50 MG  capsule Take 2 capsules (100 mg total) by mouth every night as directed 180 capsule 3   omeprazole  (PRILOSEC) 40 MG capsule Take 1 capsule (40 mg total) by mouth daily. 90 capsule 1   Phentermine  HCl (LOMAIRA ) 8 MG TABS Take 1 tablet (8 mg total) by mouth every morning. 30 tablet 0   potassium chloride  SA (KLOR-CON  M) 20 MEQ tablet Take 1 tablet (20 mEq total) by mouth 2 (two) times daily. (Patient taking differently: Take 20 mEq by mouth daily.) 60 tablet 6   topiramate  (TOPAMAX ) 100 MG tablet Take 1 tablet by mouth 2  times daily. 180 tablet 3   vitamin E 1000 UNIT capsule Take 1,000 Units by mouth daily.     [DISCONTINUED] prochlorperazine  (COMPAZINE ) 10 MG tablet Take 1 tablet (10 mg total) by mouth every 6 (six) hours as needed (Nausea or vomiting). 30 tablet 1   No current facility-administered medications on file  prior to visit.     Observations/Objective:   Vitals:   01/26/23 1355  Weight: 285 lb (129.3 kg)  Height: 5' 6 (1.676 m)   GEN:  The patient appears stated age and is in NAD.  Neurological examination: Patient is awake, alert. No aphasia or dysarthria. Intact fluency and comprehension. Cranial nerves: Extraocular movements intact with no nystagmus. No facial asymmetry. Motor: moves all extremities symmetrically, at least anti-gravity x 4.Gait: narrow-based and steady.   Assessment and Plan:   This is a pleasant 59 yo RH woman with a history of migraines and pseudotumor cerebri, no worsening of headaches or vision changes off Diamox . Proceed with eye evaluation. Migraines stable on Topiramate  100mg  BID and Nortriptyline  100mg  at bedtime, she would like to stay on current regimen. No change in low K despite stopping Diamox , continue to monitor with PCP/Oncology. She will let us  know where to send refills when she switches pharmacies. No further episodes of loss of consciousness since 06/2021. Follow-up in 6-8 months, call for any changes.    Follow Up Instructions:   -I  discussed the assessment and treatment plan with the patient. The patient was provided an opportunity to ask questions and all were answered. The patient agreed with the plan and demonstrated an understanding of the instructions.   The patient was advised to call back or seek an in-person evaluation if the symptoms worsen or if the condition fails to improve as anticipated.    Janice CHRISTELLA Shivers, MD

## 2023-01-30 ENCOUNTER — Ambulatory Visit: Payer: BC Managed Care – PPO | Admitting: Obstetrics and Gynecology

## 2023-01-30 ENCOUNTER — Encounter: Payer: Self-pay | Admitting: Obstetrics and Gynecology

## 2023-01-30 VITALS — BP 102/66 | HR 86 | Ht 66.0 in | Wt 283.0 lb

## 2023-01-30 DIAGNOSIS — Z01419 Encounter for gynecological examination (general) (routine) without abnormal findings: Secondary | ICD-10-CM

## 2023-01-30 DIAGNOSIS — N838 Other noninflammatory disorders of ovary, fallopian tube and broad ligament: Secondary | ICD-10-CM

## 2023-01-30 NOTE — Progress Notes (Signed)
NEW GYNECOLOGY PATIENT Patient name: Janice Lucas MRN [id number]  Date of birth: 1965/07/23 Chief Complaint:   No chief complaint on file.     History:  Janice Lucas is a 58 y.o. G0P0000 being seen today for incidental ovarian mass.    Discussed the use of AI scribe software for clinical note transcription with the patient, who gave verbal consent to proceed.  History of Present Illness   The patient, diagnosed with rectal cancer, presents for a consultation following recent imaging studies. She reports no symptoms related to the rectal cancer, which was incidentally discovered during a colonoscopy. The patient had been caring for her father, who has prostate cancer, and had delayed her own health screenings. She had no symptoms such as blood in the stool or any other abnormalities.  The patient has not had a menstrual period for approximately seven to eight years and denies any abnormal vaginal discharge or bleeding since menopause. She reports a history of heavy and painful periods when she was menstruating.  The patient has undergone surgery for her rectal cancer and has developed a hernia post-operatively. She reports a negative experience with a surgeon who recommended weight loss prior to hernia repair. The patient has since been placed on a weight loss medication and has lost ten pounds.  The patient's family history is significant for prostate cancer in her father. No known family history of ovarian or uterine cancer. Recent ultrasound reports suggest possible adenomyosis and a small ovarian cyst. The patient is agreeable to further testing and monitoring.        Gynecologic History Patient's last menstrual period was 01/29/2011. Contraception: abstinence Last Pap:     Component Value Date/Time   DIAGPAP  09/03/2022 1600    - Negative for intraepithelial lesion or malignancy (NILM)   HPVHIGH Negative 09/03/2022 1600   ADEQPAP  09/03/2022 1600    Satisfactory for  evaluation; transformation zone component ABSENT.    High Risk HPV: Positive  Adequacy:  Satisfactory for evaluation, transformation zone component PRESENT  Diagnosis:  Atypical squamous cells of undetermined significance (ASC-US)  Last Mammogram:  01/2021 BIRADS 1 Last Colonoscopy:  follows w/ oncology for rectal cancer  Obstetric History OB History  Gravida Para Term Preterm AB Living  0 0 0 0 0 0  SAB IAB Ectopic Multiple Live Births  0 0 0 0 0    Past Medical History:  Diagnosis Date   Allergy    SEASONAL - MILD   Diabetes mellitus without complication (HCC)    diet controlled/with meds   GERD (gastroesophageal reflux disease)    ON OMEPRAZOLE   Goals of care, counseling/discussion 04/26/2021   Hyperlipidemia    ON MEDS   Hypertension    ON MEDS   Rectal cancer (HCC) 04/26/2021   Sleep apnea    WEARS CPAP    Past Surgical History:  Procedure Laterality Date   ANTERIOR CERVICAL DECOMP/DISCECTOMY FUSION  02/14/2011   Procedure: ANTERIOR CERVICAL DECOMPRESSION/DISCECTOMY FUSION 1 LEVEL;  Surgeon: Stefani Dama, MD;  Location: [facility];  Service: Neurosurgery;  Laterality: N/A;  Anterior Cervical Six-Seven Decompression and Fusion   COLON SURGERY  09/2021   COLON SURGERY  12/2021   IR IMAGING GUIDED PORT INSERTION  05/02/2021    Current Outpatient Medications on File Prior to Visit  Medication Sig Dispense Refill   acetaminophen (TYLENOL) 500 MG tablet Take 500 mg by mouth every 6 (six) hours as needed.     Ascorbic Acid (  VITAMIN C) 1000 MG tablet Take 1,000 mg by mouth daily.     atorvastatin (LIPITOR) 20 MG tablet Take 1 tablet  by mouth daily. 90 tablet 3   Biotin 16109 MCG TABS Take by mouth daily.     Calcium Carb-Cholecalciferol (CALCIUM 1000 + D PO) Take by mouth 2 (two) times daily.     cholecalciferol (VITAMIN D3) 25 MCG (1000 UT) tablet Take 1,000 Units by mouth daily.     doxycycline (VIBRAMYCIN) 100 MG capsule Take 1 capsule (100 mg total) by  mouth 2 (two) times daily. 20 capsule 0   fluticasone (FLONASE) 50 MCG/ACT nasal spray Place 2 sprays into both nostrils daily. (Patient taking differently: Place 2 sprays into both nostrils daily as needed for allergies.) 16 g 6   hydrochlorothiazide (HYDRODIURIL) 25 MG tablet Take 1 tablet (25 mg total) by mouth daily. 90 tablet 3   losartan (COZAAR) 50 MG tablet Take 1 tablet (50 mg total) by mouth daily. 90 tablet 3   magnesium 30 MG tablet Take 30 mg by mouth 2 (two) times daily.     metFORMIN (GLUCOPHAGE) 500 MG tablet Take 1 tablet (500 mg total) by mouth daily with breakfast. 90 tablet 3   nortriptyline (PAMELOR) 50 MG capsule Take 2 capsules (100 mg total) by mouth every night as directed 180 capsule 3   omeprazole (PRILOSEC) 40 MG capsule Take 1 capsule (40 mg total) by mouth daily. 90 capsule 1   Phentermine HCl (LOMAIRA) 8 MG TABS Take 1 tablet (8 mg total) by mouth every morning. 30 tablet 0   potassium chloride SA (KLOR-CON M) 20 MEQ tablet Take 1 tablet (20 mEq total) by mouth 2 (two) times daily. (Patient taking differently: Take 20 mEq by mouth daily.) 60 tablet 6   topiramate (TOPAMAX) 100 MG tablet Take 1 tablet by mouth 2  times daily. 180 tablet 3   vitamin E 1000 UNIT capsule Take 1,000 Units by mouth daily.     [DISCONTINUED] prochlorperazine (COMPAZINE) 10 MG tablet Take 1 tablet (10 mg total) by mouth every 6 (six) hours as needed (Nausea or vomiting). 30 tablet 1   No current facility-administered medications on file prior to visit.    Allergies  Allergen Reactions   Sulfamethoxazole Other (See Comments)    Makes me loopy.   Neomycin-Bacitracin Zn-Polymyx Rash   Sulfa Antibiotics Other (See Comments)    Acts "goofy"    Social History:  reports that she has quit smoking. Her smoking use included cigarettes. She has a 20 pack-year smoking history. She has never used smokeless tobacco. She reports that she does not currently use drugs. She reports that she does not  drink alcohol.  Family History  Problem Relation Age of Onset   Colon polyps Father    Prostate cancer Father        METS TO LYMPH NODES AND BLADDER   Diabetes Maternal Aunt    Diabetes Maternal Uncle    Colon cancer Neg Hx    Esophageal cancer Neg Hx    Rectal cancer Neg Hx    Stomach cancer Neg Hx     The following portions of the patient's history were reviewed and updated as appropriate: allergies, current medications, past family history, past medical history, past social history, past surgical history and problem list.  Review of Systems Pertinent items noted in HPI and remainder of comprehensive ROS otherwise negative.  Physical Exam:  BP 102/66 (BP Location: Left Arm, Patient Position: Sitting, Cuff Size:  Large)   Pulse 86   Ht 5\' 6"  (1.676 m)   Wt 283 lb (128.4 kg)   LMP 01/29/2011   BMI 45.68 kg/m  Physical Exam Vitals and nursing note reviewed. Exam conducted with a chaperone present.  Constitutional:      Appearance: Normal appearance.  Pulmonary:     Effort: Pulmonary effort is normal.  Abdominal:     Palpations: Abdomen is soft.  Genitourinary:    General: Normal vulva.     Exam position: Lithotomy position.     Comments: No adnexal fullness noted Mild posterior fullness, nontender Neurological:     Mental Status: She is alert.      Assessment and Plan:   Assessment and Plan    Adenomyosis History of heavy and painful periods. No current symptoms as patient is postmenopausal. -No intervention required at this time.  Ovarian Cyst Small cyst identified on ultrasound. No associated symptoms reported. -Order ROMA for ovarian tumor markers to assess for malignancy. -Consider referral to GYN oncologist if tumor markers are elevated or if cyst characteristics become concerning on follow-up imaging. -Plan for periodic ultrasound monitoring if cyst appears benign. -Pelvic exam completed  Obesity Patient is aware of weight issue. Currently on weight  loss medication and has lost 10 pounds. -Encourage continued weight loss efforts.       Routine preventative health maintenance measures emphasized. Please refer to After Visit Summary for other counseling recommendations.   Follow-up: No follow-ups on file.      Lorriane Shire, MD Obstetrician & Gynecologist, Faculty [facility] [employer], [facility]

## 2023-02-01 LAB — OVARIAN MALIGNANCY RISK-ROMA
Cancer Antigen (CA) 125: 13.6 U/mL (ref 0.0–38.1)
HE4: 62.1 pmol/L (ref 0.0–105.2)
Postmenopausal ROMA: 1.32
Premenopausal ROMA: 1.18 — ABNORMAL HIGH

## 2023-02-01 LAB — POSTMENOPAUSAL INTERP: LOW

## 2023-02-01 LAB — PREMENOPAUSAL INTERP: HIGH

## 2023-02-02 ENCOUNTER — Encounter: Payer: Self-pay | Admitting: Obstetrics and Gynecology

## 2023-02-02 ENCOUNTER — Encounter: Payer: Self-pay | Admitting: Family Medicine

## 2023-02-02 DIAGNOSIS — R059 Cough, unspecified: Secondary | ICD-10-CM

## 2023-02-03 ENCOUNTER — Encounter: Payer: Self-pay | Admitting: Obstetrics and Gynecology

## 2023-02-03 ENCOUNTER — Other Ambulatory Visit: Payer: Self-pay | Admitting: Obstetrics and Gynecology

## 2023-02-03 DIAGNOSIS — N838 Other noninflammatory disorders of ovary, fallopian tube and broad ligament: Secondary | ICD-10-CM

## 2023-02-04 ENCOUNTER — Other Ambulatory Visit (HOSPITAL_BASED_OUTPATIENT_CLINIC_OR_DEPARTMENT_OTHER): Payer: Self-pay

## 2023-02-04 MED ORDER — AMOXICILLIN-POT CLAVULANATE 875-125 MG PO TABS
1.0000 | ORAL_TABLET | Freq: Two times a day (BID) | ORAL | 0 refills | Status: DC
  Filled 2023-02-04 – 2023-03-08 (×2): qty 20, 10d supply, fill #0

## 2023-02-04 NOTE — Addendum Note (Signed)
Addended by: Abbe Amsterdam C on: 02/04/2023 12:15 PM   Modules accepted: Orders

## 2023-02-06 ENCOUNTER — Other Ambulatory Visit: Payer: Self-pay

## 2023-02-06 ENCOUNTER — Other Ambulatory Visit (HOSPITAL_COMMUNITY): Payer: Self-pay

## 2023-02-06 ENCOUNTER — Other Ambulatory Visit: Payer: Self-pay | Admitting: Family

## 2023-02-06 DIAGNOSIS — E876 Hypokalemia: Secondary | ICD-10-CM

## 2023-02-06 MED ORDER — POTASSIUM CHLORIDE CRYS ER 20 MEQ PO TBCR
20.0000 meq | EXTENDED_RELEASE_TABLET | Freq: Two times a day (BID) | ORAL | 6 refills | Status: DC
  Filled 2023-02-06: qty 60, 30d supply, fill #0
  Filled 2023-03-08: qty 60, 30d supply, fill #1
  Filled 2023-04-12: qty 60, 30d supply, fill #2
  Filled 2023-05-07 (×2): qty 60, 30d supply, fill #3
  Filled 2023-06-15: qty 60, 30d supply, fill #4
  Filled 2023-07-12: qty 60, 30d supply, fill #5
  Filled 2023-08-18: qty 60, 30d supply, fill #6

## 2023-02-11 ENCOUNTER — Other Ambulatory Visit (HOSPITAL_COMMUNITY): Payer: Self-pay

## 2023-02-11 ENCOUNTER — Other Ambulatory Visit (HOSPITAL_BASED_OUTPATIENT_CLINIC_OR_DEPARTMENT_OTHER): Payer: Self-pay

## 2023-02-11 MED ORDER — PHENTERMINE HCL 15 MG PO CAPS
15.0000 mg | ORAL_CAPSULE | Freq: Every morning | ORAL | 0 refills | Status: DC
  Filled 2023-02-11: qty 30, 30d supply, fill #0

## 2023-02-17 ENCOUNTER — Encounter (HOSPITAL_BASED_OUTPATIENT_CLINIC_OR_DEPARTMENT_OTHER): Payer: Self-pay

## 2023-02-17 ENCOUNTER — Ambulatory Visit (HOSPITAL_BASED_OUTPATIENT_CLINIC_OR_DEPARTMENT_OTHER)
Admission: RE | Admit: 2023-02-17 | Discharge: 2023-02-17 | Disposition: A | Discharge status: Home / Self Care | Payer: BC Managed Care – PPO | Source: Ambulatory Visit | Attending: Obstetrics and Gynecology | Admitting: Obstetrics and Gynecology

## 2023-02-17 DIAGNOSIS — R9389 Abnormal findings on diagnostic imaging of other specified body structures: Secondary | ICD-10-CM | POA: Diagnosis not present

## 2023-02-17 DIAGNOSIS — Z1231 Encounter for screening mammogram for malignant neoplasm of breast: Secondary | ICD-10-CM | POA: Diagnosis not present

## 2023-02-17 DIAGNOSIS — Z01419 Encounter for gynecological examination (general) (routine) without abnormal findings: Secondary | ICD-10-CM | POA: Insufficient documentation

## 2023-02-17 DIAGNOSIS — N83209 Unspecified ovarian cyst, unspecified side: Secondary | ICD-10-CM | POA: Diagnosis not present

## 2023-02-17 DIAGNOSIS — N838 Other noninflammatory disorders of ovary, fallopian tube and broad ligament: Secondary | ICD-10-CM | POA: Diagnosis not present

## 2023-02-17 DIAGNOSIS — N839 Noninflammatory disorder of ovary, fallopian tube and broad ligament, unspecified: Secondary | ICD-10-CM | POA: Diagnosis not present

## 2023-02-19 ENCOUNTER — Encounter: Payer: Self-pay | Admitting: Obstetrics and Gynecology

## 2023-02-20 ENCOUNTER — Encounter: Payer: Self-pay | Admitting: Obstetrics and Gynecology

## 2023-02-20 ENCOUNTER — Telehealth: Payer: Self-pay | Admitting: Obstetrics and Gynecology

## 2023-02-20 NOTE — Telephone Encounter (Signed)
 Called patient to review pelvic ultrasound and confirmed ID x2. Noted that ultrasound shows possible increase in size but otherwise benign appearing cyst. Reached out to gyn onc who recommends re-read of pelvic mri to comment on ovaries and that if appears benign on MRI ok to continue to monitor.

## 2023-03-09 ENCOUNTER — Other Ambulatory Visit (HOSPITAL_BASED_OUTPATIENT_CLINIC_OR_DEPARTMENT_OTHER): Payer: Self-pay

## 2023-03-09 ENCOUNTER — Other Ambulatory Visit: Payer: Self-pay

## 2023-03-12 ENCOUNTER — Other Ambulatory Visit (HOSPITAL_COMMUNITY): Payer: Self-pay

## 2023-03-12 ENCOUNTER — Other Ambulatory Visit (HOSPITAL_BASED_OUTPATIENT_CLINIC_OR_DEPARTMENT_OTHER): Payer: Self-pay

## 2023-03-12 MED ORDER — PHENTERMINE HCL 15 MG PO CAPS
15.0000 mg | ORAL_CAPSULE | Freq: Every morning | ORAL | 0 refills | Status: DC
  Filled 2023-03-12 (×2): qty 30, 30d supply, fill #0

## 2023-03-13 ENCOUNTER — Telehealth: Payer: Self-pay | Admitting: Obstetrics and Gynecology

## 2023-03-13 NOTE — Telephone Encounter (Signed)
 Called and confirmed ID x2.  Noted that I had not heard back from radiology - radiologists that read MRI out today, anticipate hearing back on Monday. Reiterated originally looks benign and will get further comparison from MRI to MRI. Will contact patient after I have heard back from radiologist.

## 2023-03-22 ENCOUNTER — Other Ambulatory Visit: Payer: Self-pay | Admitting: Family Medicine

## 2023-03-22 DIAGNOSIS — K219 Gastro-esophageal reflux disease without esophagitis: Secondary | ICD-10-CM

## 2023-03-23 ENCOUNTER — Other Ambulatory Visit: Payer: Self-pay

## 2023-03-23 ENCOUNTER — Other Ambulatory Visit (HOSPITAL_BASED_OUTPATIENT_CLINIC_OR_DEPARTMENT_OTHER): Payer: Self-pay

## 2023-03-23 MED ORDER — OMEPRAZOLE 40 MG PO CPDR
40.0000 mg | DELAYED_RELEASE_CAPSULE | Freq: Every day | ORAL | 1 refills | Status: DC
  Filled 2023-03-23: qty 90, 90d supply, fill #0
  Filled 2023-06-26: qty 90, 90d supply, fill #1

## 2023-03-26 ENCOUNTER — Inpatient Hospital Stay: Payer: BC Managed Care – PPO | Admitting: Hematology & Oncology

## 2023-03-26 ENCOUNTER — Encounter: Payer: Self-pay | Admitting: Hematology & Oncology

## 2023-03-26 ENCOUNTER — Other Ambulatory Visit: Payer: Self-pay

## 2023-03-26 ENCOUNTER — Inpatient Hospital Stay

## 2023-03-26 ENCOUNTER — Inpatient Hospital Stay: Payer: BC Managed Care – PPO

## 2023-03-26 ENCOUNTER — Inpatient Hospital Stay: Payer: BC Managed Care – PPO | Attending: Hematology & Oncology

## 2023-03-26 VITALS — BP 108/74 | HR 88 | Temp 98.3°F | Resp 18 | Ht 66.0 in | Wt 270.0 lb

## 2023-03-26 DIAGNOSIS — C20 Malignant neoplasm of rectum: Secondary | ICD-10-CM | POA: Diagnosis not present

## 2023-03-26 DIAGNOSIS — Z9221 Personal history of antineoplastic chemotherapy: Secondary | ICD-10-CM | POA: Diagnosis not present

## 2023-03-26 DIAGNOSIS — Z85048 Personal history of other malignant neoplasm of rectum, rectosigmoid junction, and anus: Secondary | ICD-10-CM | POA: Insufficient documentation

## 2023-03-26 LAB — CMP (CANCER CENTER ONLY)
ALT: 12 U/L (ref 0–44)
AST: 16 U/L (ref 15–41)
Albumin: 4.8 g/dL (ref 3.5–5.0)
Alkaline Phosphatase: 73 U/L (ref 38–126)
Anion gap: 10 (ref 5–15)
BUN: 24 mg/dL — ABNORMAL HIGH (ref 6–20)
CO2: 26 mmol/L (ref 22–32)
Calcium: 9.3 mg/dL (ref 8.9–10.3)
Chloride: 103 mmol/L (ref 98–111)
Creatinine: 0.92 mg/dL (ref 0.44–1.00)
GFR, Estimated: >60 mL/min (ref >60)
Glucose, Bld: 107 mg/dL — ABNORMAL HIGH (ref 70–99)
Potassium: 3.8 mmol/L (ref 3.5–5.1)
Sodium: 139 mmol/L (ref 135–145)
Total Bilirubin: 0.4 mg/dL (ref 0.0–1.2)
Total Protein: 7.7 g/dL (ref 6.5–8.1)

## 2023-03-26 LAB — CBC WITH DIFFERENTIAL (CANCER CENTER ONLY)
Abs Immature Granulocytes: 0.01 10*3/uL (ref 0.00–0.07)
Basophils Absolute: 0 10*3/uL (ref 0.0–0.1)
Basophils Relative: 1 %
Eosinophils Absolute: 0.2 10*3/uL (ref 0.0–0.5)
Eosinophils Relative: 3 %
HCT: 40.4 % (ref 36.0–46.0)
Hemoglobin: 13.3 g/dL (ref 12.0–15.0)
Immature Granulocytes: 0 %
Lymphocytes Relative: 25 %
Lymphs Abs: 2 10*3/uL (ref 0.7–4.0)
MCH: 28 pg (ref 26.0–34.0)
MCHC: 32.9 g/dL (ref 30.0–36.0)
MCV: 85.1 fL (ref 80.0–100.0)
Monocytes Absolute: 0.5 10*3/uL (ref 0.1–1.0)
Monocytes Relative: 6 %
Neutro Abs: 5.1 10*3/uL (ref 1.7–7.7)
Neutrophils Relative %: 65 %
Platelet Count: 305 10*3/uL (ref 150–400)
RBC: 4.75 MIL/uL (ref 3.87–5.11)
RDW: 14.6 % (ref 11.5–15.5)
WBC Count: 7.8 10*3/uL (ref 4.0–10.5)
nRBC: 0 % (ref 0.0–0.2)

## 2023-03-26 LAB — CEA (ACCESS): CEA (CHCC): 2.47 ng/mL (ref 0.00–5.00)

## 2023-03-26 MED ORDER — SODIUM CHLORIDE 0.9% FLUSH: 10.0000 mL | INTRAVENOUS | Status: DC | PRN

## 2023-03-26 MED ORDER — HEPARIN SOD (PORK) LOCK FLUSH 100 UNIT/ML IV SOLN
500.0000 [IU] | Freq: Once | INTRAVENOUS | Status: DC
Start: 2023-03-26 — End: 2023-03-26

## 2023-03-26 NOTE — Progress Notes (Signed)
 Hematology and Oncology Follow Up Visit  Janice Lucas [id number] 22-Mar-1965 58 y.o. 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  We last saw her back in January.  Since then, she had been doing okay.  When we saw her back in January, her CEA level was 2.4.  She has had no problems with bowels or bladder.  She has had no bleeding.  She has had no abdominal pain.  She has had no cough or shortness of breath.  She has had no leg swelling.  She is still working.  She is quite busy at work.  She is on potassium pills.  Today, her potassium is 3.8.  I told her to stay on her potassium.  She has had no fever.  Thankfully, she has avoided COVID.  Overall, I would have to say that her performance status is ECOG 0.    Medications:  Allergies as of 03/26/2023       Reactions   Sulfamethoxazole Other (See Comments)   Makes me loopy.   Neomycin-bacitracin Zn-polymyx Rash   Sulfa Antibiotics Other (See Comments)   Acts "goofy"        Medication List        Accurate as of March 26, 2023  4:22 PM. If you have any questions, ask your nurse or doctor.          acetaminophen 500 MG tablet Commonly known as: TYLENOL Take 500 mg by mouth every 6 (six) hours as needed.   amoxicillin-clavulanate 875-125 MG tablet Commonly known as: AUGMENTIN Take 1 tablet by mouth 2 (two) times daily.   atorvastatin 20 MG tablet Commonly known as: LIPITOR Take 1 tablet  by mouth daily.   Biotin 29528 MCG Tabs Take by mouth daily.   CALCIUM 1000 + D PO Take by mouth 2 (two) times daily.    cholecalciferol 25 MCG (1000 UNIT) tablet Commonly known as: VITAMIN D3 Take 1,000 Units by mouth daily.   fluticasone 50 MCG/ACT nasal spray Commonly known as: FLONASE Place 2 sprays into both nostrils daily. What changed:  when to take this reasons to take this   hydrochlorothiazide 25 MG tablet Commonly known as: HYDRODIURIL Take 1 tablet (25 mg total) by mouth daily.   losartan 50 MG tablet Commonly known as: COZAAR Take 1 tablet (50 mg total) by mouth daily.   magnesium 30 MG tablet Take 30 mg by mouth 2 (two) times daily.   metFORMIN 500 MG tablet Commonly known as: GLUCOPHAGE Take 1 tablet (500 mg total) by mouth daily with breakfast.   nortriptyline 50 MG capsule Commonly known as: PAMELOR Take 2 capsules (100 mg total) by mouth every night as directed   omeprazole 40 MG capsule Commonly known as: PRILOSEC Take 1 capsule (40 mg total) by mouth daily.   phentermine 15 MG capsule Take 1 capsule (15 mg total) by mouth every morning. What changed: Another medication with the same name was removed. Continue taking this medication, and follow the directions you see here. Changed by: Janice Lucas   phentermine 15 MG capsule  Take 15 mg by mouth daily. What changed: Another medication with the same name was removed. Continue taking this medication, and follow the directions you see here. Changed by: Janice Lucas   potassium chloride SA 20 MEQ tablet Commonly known as: KLOR-CON M Take 1 tablet (20 mEq total) by mouth 2 (two) times daily.   topiramate 100 MG tablet Commonly known as: TOPAMAX Take 1 tablet by mouth 2  times daily.   vitamin C 1000 MG tablet Take 1,000 mg by mouth daily.   vitamin E 1000 UNIT capsule Take 1,000 Units by mouth daily.        Allergies:  Allergies  Allergen Reactions   Sulfamethoxazole Other (See Comments)    Makes me loopy.   Neomycin-Bacitracin Zn-Polymyx Rash   Sulfa Antibiotics Other (See Comments)    Acts  "goofy"    Past Medical History, Surgical history, Social history, and Family History were reviewed and updated.  Review of Systems: Review of Systems  Constitutional: Negative.   HENT: Negative.    Eyes: Negative.   Respiratory: Negative.    Cardiovascular: Negative.   Gastrointestinal: Negative.   Genitourinary: Negative.   Musculoskeletal: Negative.   Skin: Negative.   Neurological: Negative.   Endo/Heme/Allergies: Negative.   Psychiatric/Behavioral: Negative.       Physical Exam:  height is 5\' 6"  (1.676 m) and weight is 270 lb (122.5 kg). Her oral temperature is 98.3 F (36.8 C). Her blood pressure is 108/74 and her pulse is 88. Her respiration is 18 and oxygen saturation is 97%.   Wt Readings from Last 3 Encounters:  03/26/23 270 lb (122.5 kg)  01/30/23 283 lb (128.4 kg)  01/26/23 285 lb (129.3 kg)   Physical Exam Vitals reviewed.  HENT:     Head: Normocephalic and atraumatic.  Eyes:     Pupils: Pupils are equal, round, and reactive to light.  Cardiovascular:     Rate and Rhythm: Normal rate and regular rhythm.     Heart sounds: Normal heart sounds.  Pulmonary:     Effort: Pulmonary effort is normal.     Breath sounds: Normal breath sounds.  Abdominal:     General: Bowel sounds are normal.     Palpations: Abdomen is soft.  Musculoskeletal:        General: No tenderness or deformity. Normal range of motion.     Cervical back: Normal range of motion.  Lymphadenopathy:     Cervical: No cervical adenopathy.  Skin:    General: Skin is warm and dry.     Findings: No erythema or rash.  Neurological:     Mental Status: She is alert and oriented to person, place, and time.  Psychiatric:        Behavior: Behavior normal.        Thought Content: Thought content normal.        Judgment: Judgment normal.       Lab Results  Component Value Date   WBC 7.8 03/26/2023   HGB 13.3 03/26/2023   HCT 40.4 03/26/2023   MCV 85.1 03/26/2023   PLT 305 03/26/2023    Lab Results  Component Value Date   FERRITIN 99 06/17/2021   IRON 48 06/17/2021   TIBC 311 06/17/2021   UIBC 263 06/17/2021   IRONPCTSAT 15 06/17/2021   Lab Results  Component Value Date   RBC 4.75 03/26/2023   No results found for: "KPAFRELGTCHN", "LAMBDASER", "KAPLAMBRATIO" No results found for: "IGGSERUM", "IGA", "IGMSERUM" No results found for: "  TOTALPROTELP", "ALBUMINELP", "A1GS", "A2GS", "BETS", "BETA2SER", "GAMS", "MSPIKE", "SPEI"   Chemistry      Component Value Date/Time   NA 139 03/26/2023 1500   K 3.8 03/26/2023 1500   CL 103 03/26/2023 1500   CO2 26 03/26/2023 1500   BUN 24 (H) 03/26/2023 1500   CREATININE 0.92 03/26/2023 1500   CREATININE 0.84 11/15/2019 1516      Component Value Date/Time   CALCIUM 9.3 03/26/2023 1500   ALKPHOS 73 03/26/2023 1500   AST 16 03/26/2023 1500   ALT 12 03/26/2023 1500   BILITOT 0.4 03/26/2023 1500       Impression and Plan: Ms. Folts is a very charming 58 yo white female with localized rectal cancer, stage IIA.   We tried her on neoadjuvant chemotherapy which she tolerated incredibly poorly.  As such, we finally did get her to surgery.  She had no residual disease at surgery.  I will set her up with a MRI after Memorial Day.  I think this is very reasonable.  I think she is doing quite well.  I had to believe that her chance of cure should be over 90%.  I will see her back after she has had her MRI done.    Janice Macho, MD 3/13/20254:22 PM

## 2023-03-26 NOTE — Patient Instructions (Signed)

## 2023-03-27 ENCOUNTER — Encounter: Payer: Self-pay | Admitting: *Deleted

## 2023-04-07 ENCOUNTER — Telehealth (HOSPITAL_BASED_OUTPATIENT_CLINIC_OR_DEPARTMENT_OTHER): Payer: Self-pay

## 2023-04-11 ENCOUNTER — Encounter: Payer: Self-pay | Admitting: Family Medicine

## 2023-04-11 DIAGNOSIS — J309 Allergic rhinitis, unspecified: Secondary | ICD-10-CM

## 2023-04-12 ENCOUNTER — Other Ambulatory Visit: Payer: Self-pay | Admitting: Family Medicine

## 2023-04-12 MED ORDER — FLUTICASONE PROPIONATE 50 MCG/ACT NA SUSP
2.0000 | Freq: Every day | NASAL | 11 refills | Status: AC | PRN
  Filled 2023-04-12: qty 16, 30d supply, fill #0
  Filled 2023-10-03: qty 16, 30d supply, fill #1

## 2023-04-13 ENCOUNTER — Other Ambulatory Visit (HOSPITAL_BASED_OUTPATIENT_CLINIC_OR_DEPARTMENT_OTHER): Payer: Self-pay

## 2023-04-13 MED ORDER — PHENTERMINE HCL 15 MG PO CAPS
15.0000 mg | ORAL_CAPSULE | Freq: Every day | ORAL | 1 refills | Status: DC
  Filled 2023-04-13: qty 30, 30d supply, fill #0

## 2023-05-07 ENCOUNTER — Other Ambulatory Visit (HOSPITAL_BASED_OUTPATIENT_CLINIC_OR_DEPARTMENT_OTHER): Payer: Self-pay

## 2023-05-07 ENCOUNTER — Inpatient Hospital Stay: Attending: Hematology & Oncology

## 2023-05-07 DIAGNOSIS — G932 Benign intracranial hypertension: Secondary | ICD-10-CM

## 2023-05-07 DIAGNOSIS — R197 Diarrhea, unspecified: Secondary | ICD-10-CM

## 2023-05-07 DIAGNOSIS — Z452 Encounter for adjustment and management of vascular access device: Secondary | ICD-10-CM | POA: Diagnosis not present

## 2023-05-07 DIAGNOSIS — Z85048 Personal history of other malignant neoplasm of rectum, rectosigmoid junction, and anus: Secondary | ICD-10-CM | POA: Diagnosis not present

## 2023-05-07 DIAGNOSIS — Z7189 Other specified counseling: Secondary | ICD-10-CM

## 2023-05-07 DIAGNOSIS — G43009 Migraine without aura, not intractable, without status migrainosus: Secondary | ICD-10-CM

## 2023-05-07 DIAGNOSIS — Z95828 Presence of other vascular implants and grafts: Secondary | ICD-10-CM

## 2023-05-07 DIAGNOSIS — E782 Mixed hyperlipidemia: Secondary | ICD-10-CM

## 2023-05-07 DIAGNOSIS — Z9221 Personal history of antineoplastic chemotherapy: Secondary | ICD-10-CM | POA: Diagnosis not present

## 2023-05-07 DIAGNOSIS — R300 Dysuria: Secondary | ICD-10-CM

## 2023-05-07 DIAGNOSIS — I10 Essential (primary) hypertension: Secondary | ICD-10-CM

## 2023-05-07 DIAGNOSIS — E876 Hypokalemia: Secondary | ICD-10-CM

## 2023-05-07 DIAGNOSIS — K30 Functional dyspepsia: Secondary | ICD-10-CM

## 2023-05-07 DIAGNOSIS — R7303 Prediabetes: Secondary | ICD-10-CM

## 2023-05-07 DIAGNOSIS — J309 Allergic rhinitis, unspecified: Secondary | ICD-10-CM

## 2023-05-07 DIAGNOSIS — M50223 Other cervical disc displacement at C6-C7 level: Secondary | ICD-10-CM

## 2023-05-07 DIAGNOSIS — I743 Embolism and thrombosis of arteries of the lower extremities: Secondary | ICD-10-CM

## 2023-05-07 DIAGNOSIS — C20 Malignant neoplasm of rectum: Secondary | ICD-10-CM

## 2023-05-07 MED ORDER — SODIUM CHLORIDE 0.9% FLUSH
10.0000 mL | INTRAVENOUS | Status: DC | PRN
  Administered 2023-05-07: 10 mL via INTRAVENOUS

## 2023-05-07 MED ORDER — HEPARIN SOD (PORK) LOCK FLUSH 100 UNIT/ML IV SOLN
500.0000 [IU] | Freq: Once | INTRAVENOUS | Status: AC
Start: 2023-05-07 — End: 2023-05-07
  Administered 2023-05-07: 500 [IU] via INTRAVENOUS

## 2023-05-08 ENCOUNTER — Other Ambulatory Visit (HOSPITAL_COMMUNITY): Payer: Self-pay

## 2023-05-12 ENCOUNTER — Other Ambulatory Visit (HOSPITAL_COMMUNITY): Payer: Self-pay

## 2023-05-12 MED ORDER — PHENTERMINE HCL 30 MG PO CAPS
30.0000 mg | ORAL_CAPSULE | Freq: Every morning | ORAL | 0 refills | Status: DC
  Filled 2023-05-12 – 2023-05-14 (×2): qty 30, 30d supply, fill #0

## 2023-05-12 NOTE — Progress Notes (Addendum)
 [facility] [address] [place], [place] [address] [phone]  Date:  05/14/2023   Name:  Janice Lucas   DOB:  1965-03-15   MRN:  [id number]  PCP:  Watt Harlene BROCKS, MD    Chief Complaint: Pyelonephritis (Onset over the weekend /Urgency and frequency 1-2 times a night /Cramping /Pt will start 30 of phentermine  )   History of Present Illness:  Janice Lucas is a 58 y.o. very pleasant female patient who presents with the following:  Pt seen today with concern of a kidney infection Last seen by myself in January   Pt with history of HTN, migraine HA, hyperlipidemia, intracranial hypertension/ pseudotumor cerebri dx in 2015, mild diabetes.  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   Class 3 severe obesity due to excess calories with serious comorbidity and body mass index (BMI) of 45.0 to 49.9 in adult 11/19/2022   Insufficiency fracture of medial femoral condyle (HCC) 04/17/2022   Degenerative tear of left medial meniscus 04/17/2022   Estrogen deficiency 04/17/2022   Rectal cancer (HCC) 04/26/2021   Goals of care, counseling/discussion 04/26/2021   Allergic rhinitis 05/15/2020   Hypertension 07/05/2018   Migraine without aura and without status migrainosus, not intractable 02/08/2016   Depression 10/10/2015   Injury by electrocution 10/10/2015   Mixed hyperlipidemia 10/10/2015   Prediabetes 10/10/2015   Urinary incontinence 10/10/2015   Sleep apnea 08/30/2015   IIH (idiopathic intracranial hypertension) 11/14/2013   Herniated nucleus pulposus, C6-7 right  02/14/2011    Past Medical History:  Diagnosis Date   Allergy    SEASONAL - MILD   Diabetes mellitus without complication (HCC)    diet controlled/with meds   GERD (gastroesophageal reflux disease)    ON OMEPRAZOLE    Goals of care, counseling/discussion 04/26/2021   Hyperlipidemia    ON MEDS   Hypertension    ON MEDS   Rectal cancer (HCC) 04/26/2021   Sleep apnea    WEARS CPAP    Past Surgical History:  Procedure Laterality Date   ANTERIOR CERVICAL DECOMP/DISCECTOMY FUSION  02/14/2011   Procedure: ANTERIOR CERVICAL DECOMPRESSION/DISCECTOMY FUSION 1 LEVEL;  Surgeon: Victory JINNY Gens, MD;  Location: [facility];  Service: Neurosurgery;  Laterality: N/A;  Anterior Cervical Six-Seven Decompression and Fusion   COLON SURGERY  09/2021   COLON SURGERY  12/2021   IR IMAGING GUIDED PORT INSERTION  05/02/2021    Social History   Tobacco Use   Smoking status: Former    Current packs/day: 1.00    Average packs/day: 1 pack/day for 20.0 years (20.0 ttl pk-yrs)    Types: Cigarettes   Smokeless tobacco: Never   Tobacco comments:    VAPES   Vaping Use   Vaping status: Every Day   Substances: Nicotine, Flavoring  Substance Use Topics   Alcohol  use: No    Comment: occassionally   Drug use: Not Currently    Family History  Problem Relation Age of Onset   Colon polyps Father    Prostate cancer Father  METS TO LYMPH NODES AND BLADDER   Diabetes Maternal Aunt    Diabetes Maternal Uncle    Colon cancer Neg Hx    Esophageal cancer Neg Hx    Rectal cancer Neg Hx    Stomach cancer Neg Hx     Allergies  Allergen Reactions   Sulfamethoxazole Other (See Comments)    Makes me loopy.   Neomycin-Bacitracin  Zn-Polymyx Rash   Sulfa Antibiotics Other (See Comments)    Acts goofy    Medication list has been reviewed and updated.  Current Outpatient Medications on File Prior to Visit  Medication Sig Dispense Refill   acetaminophen  (TYLENOL ) 500 MG tablet Take 500 mg by  mouth every 6 (six) hours as needed.     Ascorbic Acid (VITAMIN C) 1000 MG tablet Take 1,000 mg by mouth daily.     atorvastatin  (LIPITOR) 20 MG tablet Take 1 tablet  by mouth daily. 90 tablet 3   Biotin 89999 MCG TABS Take by mouth daily.     Calcium  Carb-Cholecalciferol (CALCIUM  1000 + D PO) Take by mouth 2 (two) times daily.     cholecalciferol (VITAMIN D3) 25 MCG (1000 UT) tablet Take 1,000 Units by mouth daily.     fluticasone  (FLONASE ) 50 MCG/ACT nasal spray Place 2 sprays into both nostrils daily as needed for allergies. 16 g 11   hydrochlorothiazide  (HYDRODIURIL ) 25 MG tablet Take 1 tablet (25 mg total) by mouth daily. 90 tablet 3   losartan  (COZAAR ) 50 MG tablet Take 1 tablet (50 mg total) by mouth daily. 90 tablet 3   magnesium 30 MG tablet Take 30 mg by mouth 2 (two) times daily.     metFORMIN  (GLUCOPHAGE ) 500 MG tablet Take 1 tablet (500 mg total) by mouth daily with breakfast. 90 tablet 3   nortriptyline  (PAMELOR ) 50 MG capsule Take 2 capsules (100 mg total) by mouth every night as directed 180 capsule 3   omeprazole  (PRILOSEC) 40 MG capsule Take 1 capsule (40 mg total) by mouth daily. 90 capsule 1   phentermine  30 MG capsule Take 1 capsule (30 mg total) by mouth in the morning. Take  phentermine  once daily along with Topiramate . 30 capsule 0   potassium chloride  SA (KLOR-CON  M) 20 MEQ tablet Take 1 tablet (20 mEq total) by mouth 2 (two) times daily. 60 tablet 6   topiramate  (TOPAMAX ) 100 MG tablet Take 1 tablet by mouth 2  times daily. 180 tablet 3   vitamin E 1000 UNIT capsule Take 1,000 Units by mouth daily.     amoxicillin -clavulanate (AUGMENTIN ) 875-125 MG tablet Take 1 tablet by mouth 2 (two) times daily. 20 tablet 0   phentermine  15 MG capsule Take 1 capsule (15 mg total) by mouth daily. 30 capsule 1   [DISCONTINUED] prochlorperazine  (COMPAZINE ) 10 MG tablet Take 1 tablet (10 mg total) by mouth every 6 (six) hours as needed (Nausea or vomiting). 30 tablet 1   No current  facility-administered medications on file prior to visit.    Review of Systems:  As per HPI- otherwise negative.   Physical Examination: Vitals:   05/14/23 1439  BP: 118/84  Pulse: 88  SpO2: 99%   Vitals:   05/14/23 1439  Weight: 259 lb 9.6 oz (117.8 kg)  Height: 5' 6 (1.676 m)   Body mass index is 41.9 kg/m. Ideal Body Weight: Weight in (lb) to have BMI = 25: 154.6  GEN: no acute distress.  Obese, looks well HEENT: Atraumatic, Normocephalic.  Ears and Nose: No  external deformity. CV: RRR, No M/G/R. No JVD. No thrill. No extra heart sounds. PULM: CTA B, no wheezes, crackles, rhonchi. No retractions. No resp. distress. No accessory muscle use. ABD: S, ND, +BS. No rebound. No HSM. No CVA tenderness, minimal suprapubic tenderness EXTR: No c/c/e PSYCH: Normally interactive. Conversant.   Results for orders placed or performed in visit on 05/14/23  POCT urinalysis dipstick   Collection Time: 05/14/23  3:19 PM  Result Value Ref Range   Color, UA yellow yellow   Clarity, UA clear clear   Glucose, UA negative negative mg/dL   Bilirubin, UA negative negative   Ketones, POC UA negative negative mg/dL   Spec Grav, UA 8.989 8.989 - 1.025   Blood, UA negative negative   pH, UA 5.0 5.0 - 8.0   Protein Ur, POC trace (A) negative mg/dL   Urobilinogen, UA 0.2 0.2 or 1.0 E.U./dL   Nitrite, UA Negative Negative   Leukocytes, UA Small (1+) (A) Negative  Urine Culture   Collection Time: 05/14/23  3:29 PM   Specimen: Urine  Result Value Ref Range   MICRO NUMBER: [id number]    SPECIMEN QUALITY: Adequate    Sample Source URINE    STATUS: FINAL    ISOLATE 1: Proteus mirabilis (A)       Susceptibility   Proteus mirabilis - URINE CULTURE, REFLEX    AMOX/CLAVULANIC <=2 Sensitive     AMPICILLIN <=2 Sensitive     AMPICILLIN/SULBACTAM <=2 Sensitive     CEFAZOLIN * <=4 Not Reportable      * For infections other than uncomplicated UTI caused by E. coli, K. pneumoniae or P.  mirabilis: Cefazolin  is resistant if MIC > or = 8 mcg/mL. (Distinguishing susceptible versus intermediate for isolates with MIC < or = 4 mcg/mL requires additional testing.) For uncomplicated UTI caused by E. coli, K. pneumoniae or P. mirabilis: Cefazolin  is susceptible if MIC <32 mcg/mL and predicts susceptible to the oral agents cefaclor, cefdinir , cefpodoxime, cefprozil, cefuroxime, cephalexin  and loracarbef.     CEFTAZIDIME <=1 Sensitive     CEFEPIME <=1 Sensitive     CEFTRIAXONE <=1 Sensitive     CIPROFLOXACIN <=0.25 Sensitive     LEVOFLOXACIN <=0.12 Sensitive     GENTAMICIN <=1 Sensitive     IMIPENEM 2 Intermediate     NITROFURANTOIN  128 Resistant     PIP/TAZO <=4 Sensitive     TOBRAMYCIN <=1 Sensitive     TRIMETH/SULFA* <=20 Sensitive      * For infections other than uncomplicated UTI caused by E. coli, K. pneumoniae or P. mirabilis: Cefazolin  is resistant if MIC > or = 8 mcg/mL. (Distinguishing susceptible versus intermediate for isolates with MIC < or = 4 mcg/mL requires additional testing.) For uncomplicated UTI caused by E. coli, K. pneumoniae or P. mirabilis: Cefazolin  is susceptible if MIC <32 mcg/mL and predicts susceptible to the oral agents cefaclor, cefdinir , cefpodoxime, cefprozil, cefuroxime, cephalexin  and loracarbef. Legend: S = Susceptible  I = Intermediate R = Resistant  NS = Not susceptible SDD = Susceptible Dose Dependent * = Not Tested  NR = Not Reported **NN = See Therapy Comments     Assessment and Plan: Urinary frequency - Plan: Urine Culture, POCT urinalysis dipstick, nitrofurantoin , macrocrystal-monohydrate, (MACROBID ) 100 MG capsule  Patient seen today with concern of urinary frequency, suspect a UTI.  She does not have features consistent with pyelonephritis.  Will start on Macrobid , await urine culture.  She is asked to let me know if getting worse or any  other concerns  Patient notes she is using phentermine  prescribed by her surgeon  for weight loss prior to having abdominal surgery She had lost about 40 pounds Signed Harlene Schroeder, MD  Received urine culture 5/4- she has bacteria resistant to macrobid  Called pt and let her know I sent an rx for Keflex  to [facility] pharmacy at her request

## 2023-05-13 ENCOUNTER — Other Ambulatory Visit: Payer: Self-pay

## 2023-05-14 ENCOUNTER — Encounter: Payer: Self-pay | Admitting: Family Medicine

## 2023-05-14 ENCOUNTER — Ambulatory Visit: Admitting: Family Medicine

## 2023-05-14 ENCOUNTER — Other Ambulatory Visit (HOSPITAL_BASED_OUTPATIENT_CLINIC_OR_DEPARTMENT_OTHER): Payer: Self-pay

## 2023-05-14 ENCOUNTER — Other Ambulatory Visit (HOSPITAL_COMMUNITY): Payer: Self-pay

## 2023-05-14 VITALS — BP 118/84 | HR 88 | Ht 66.0 in | Wt 259.6 lb

## 2023-05-14 DIAGNOSIS — R350 Frequency of micturition: Secondary | ICD-10-CM

## 2023-05-14 LAB — POCT URINALYSIS DIP (MANUAL ENTRY)
Bilirubin, UA: NEGATIVE
Blood, UA: NEGATIVE
Glucose, UA: NEGATIVE mg/dL
Ketones, POC UA: NEGATIVE mg/dL
Nitrite, UA: NEGATIVE
Spec Grav, UA: 1.01 (ref 1.010–1.025)
Urobilinogen, UA: 0.2 U/dL
pH, UA: 5 (ref 5.0–8.0)

## 2023-05-14 MED ORDER — NITROFURANTOIN MONOHYD MACRO 100 MG PO CAPS
100.0000 mg | ORAL_CAPSULE | Freq: Two times a day (BID) | ORAL | 0 refills | Status: DC
Start: 2023-05-14 — End: 2023-05-17
  Filled 2023-05-14: qty 14, 7d supply, fill #0

## 2023-05-14 NOTE — Patient Instructions (Addendum)
 We will be in touch with your urine culture asap - for now start on the macrobid   Let me know if you are not improving over the next couple of days- Sooner if worse.

## 2023-05-15 ENCOUNTER — Encounter: Payer: Self-pay | Admitting: Family Medicine

## 2023-05-17 ENCOUNTER — Other Ambulatory Visit (HOSPITAL_BASED_OUTPATIENT_CLINIC_OR_DEPARTMENT_OTHER): Payer: Self-pay

## 2023-05-17 LAB — URINE CULTURE
MICRO NUMBER:: 16400915
SPECIMEN QUALITY:: ADEQUATE

## 2023-05-17 MED ORDER — CEPHALEXIN 500 MG PO CAPS
500.0000 mg | ORAL_CAPSULE | Freq: Two times a day (BID) | ORAL | 0 refills | Status: DC
  Filled 2023-05-17: qty 14, 7d supply, fill #0

## 2023-05-17 NOTE — Addendum Note (Signed)
 Addended by: Gates Kasal C on: 05/17/2023 11:58 AM   Modules accepted: Orders

## 2023-05-18 ENCOUNTER — Other Ambulatory Visit (HOSPITAL_BASED_OUTPATIENT_CLINIC_OR_DEPARTMENT_OTHER): Payer: Self-pay

## 2023-05-18 IMAGING — CT CT HEAD W/O CM
3 of 4 series · 13 of 47 positions shown, 15 images · non-contrast
Comparison: Head CT 12/09/2009.

CLINICAL DATA: 55-year-old female with history of new onset of
seizure. History of trauma.



[Series 2: head without · axial · non-contrast · 0.44mm/px · z∈[-61,+69]mm · 7 of 36 slices shown, 9 images]
[im 5/36  brain]
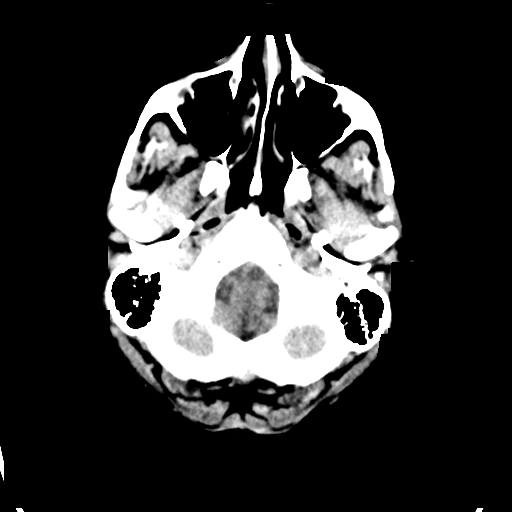
[im 5/36  bone]
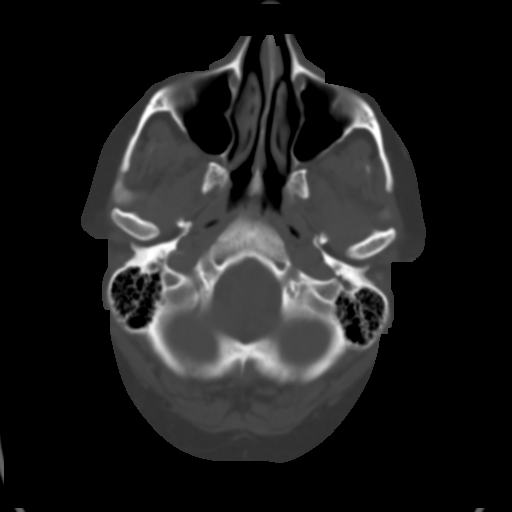
[im 9/36  brain]
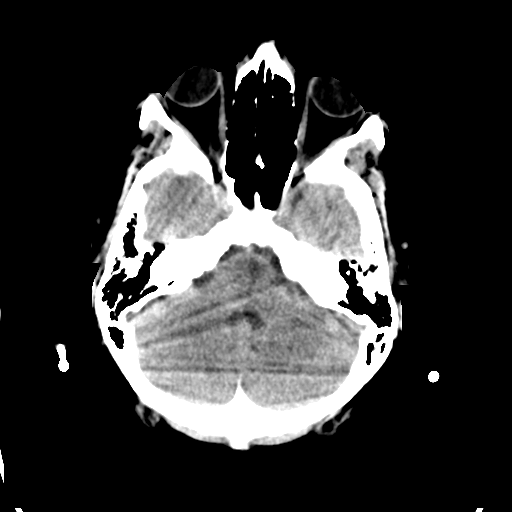
[im 14/36  brain]
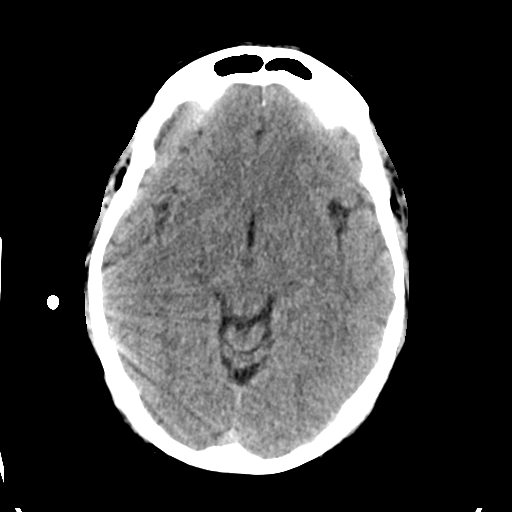
[im 18/36  brain]
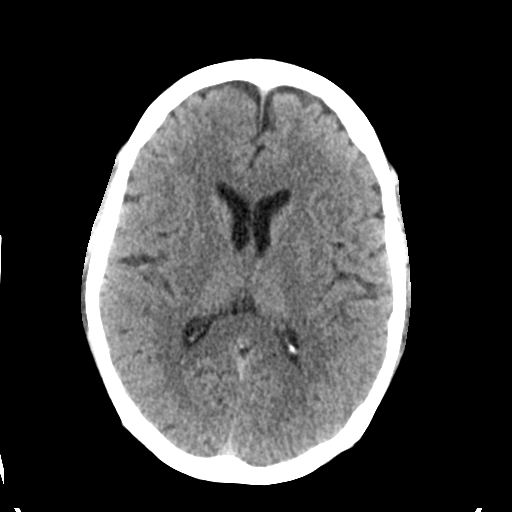
[im 22/36  brain]
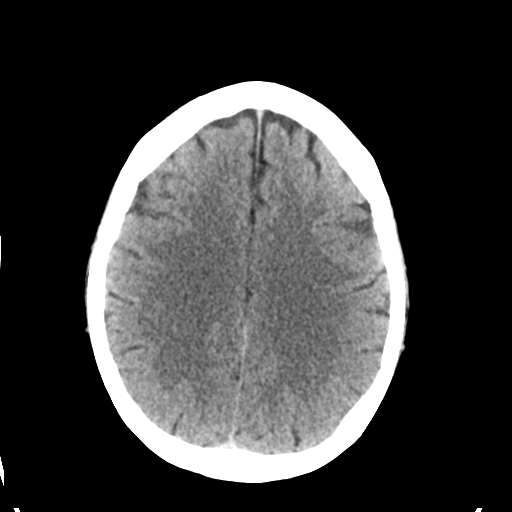
[im 22/36  bone]
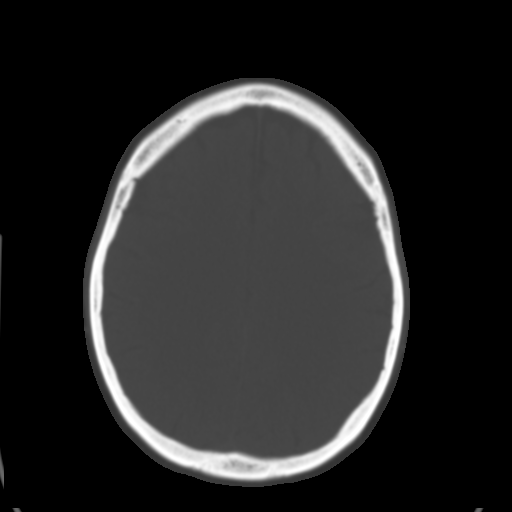
[im 27/36  brain]
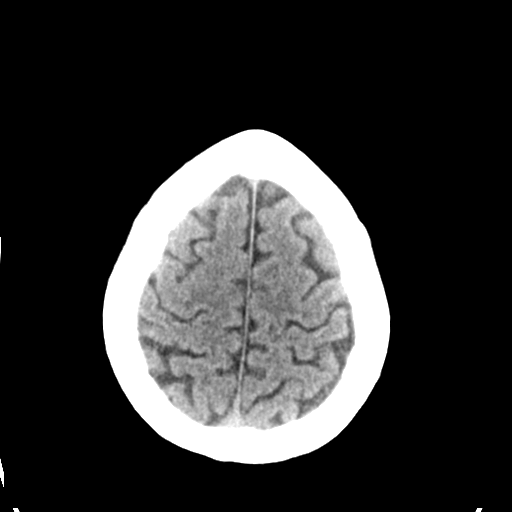
[im 31/36  brain]
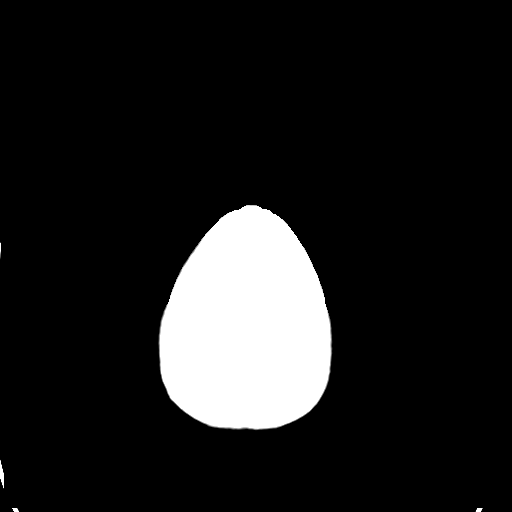

[Series 4: head without cor · coronal · non-contrast · 0.35mm/px · 3 of 67 slices shown]
[im 23/67  brain]
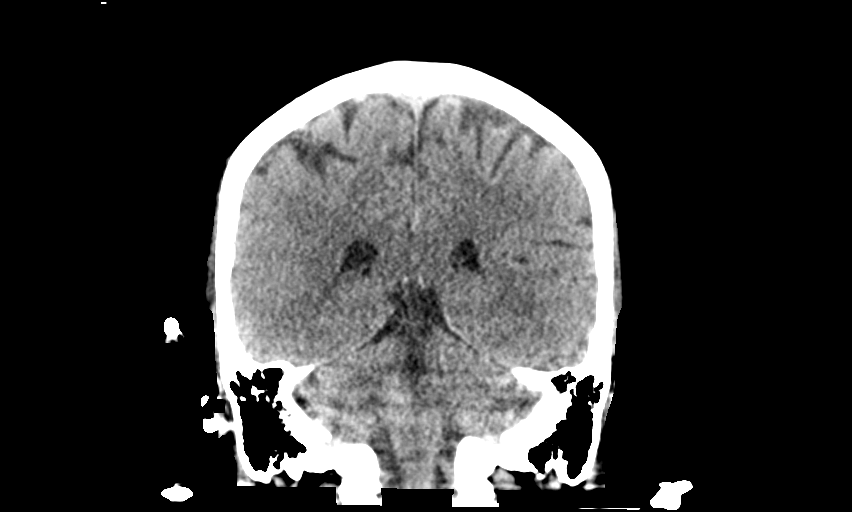
[im 30/67  brain]
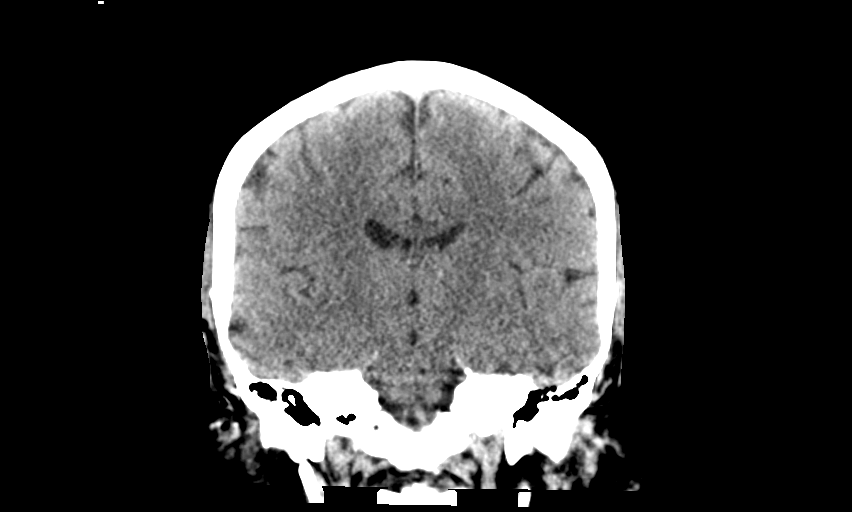
[im 37/67  brain]
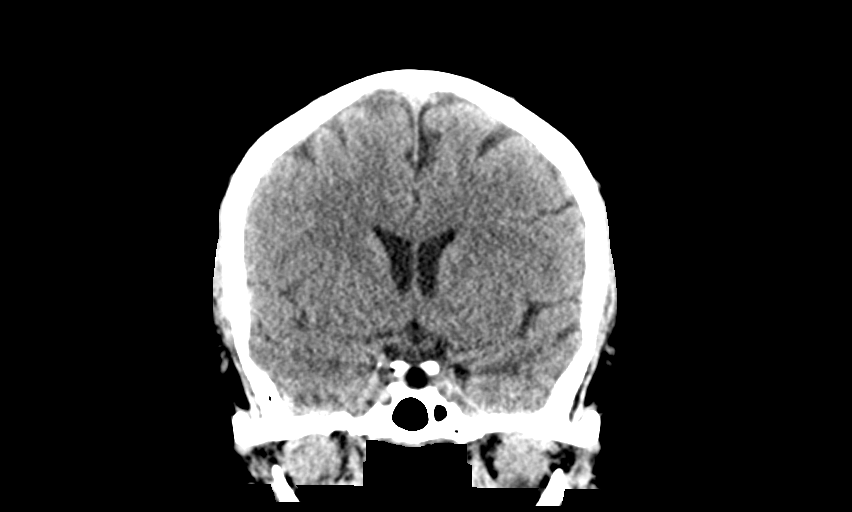

[Series 5: head without sag · sagittal · non-contrast · 0.35mm/px · 3 of 67 slices shown]
[im 23/67  brain]
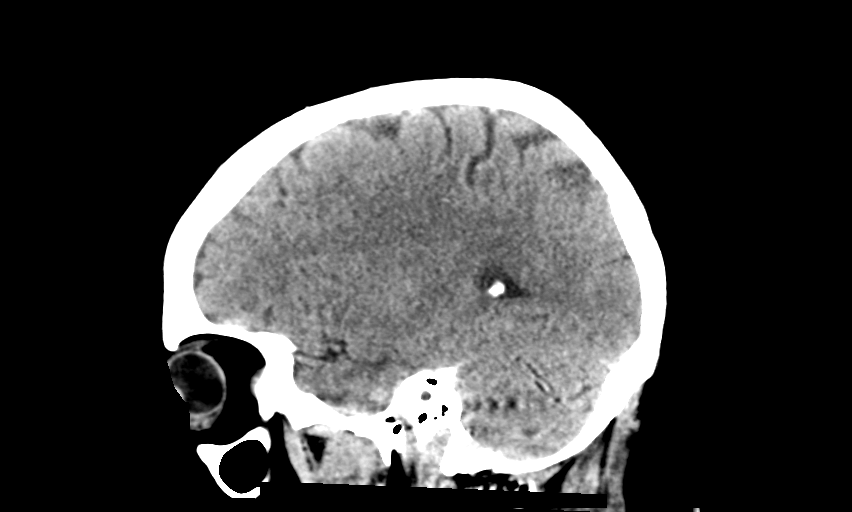
[im 34/67  brain]
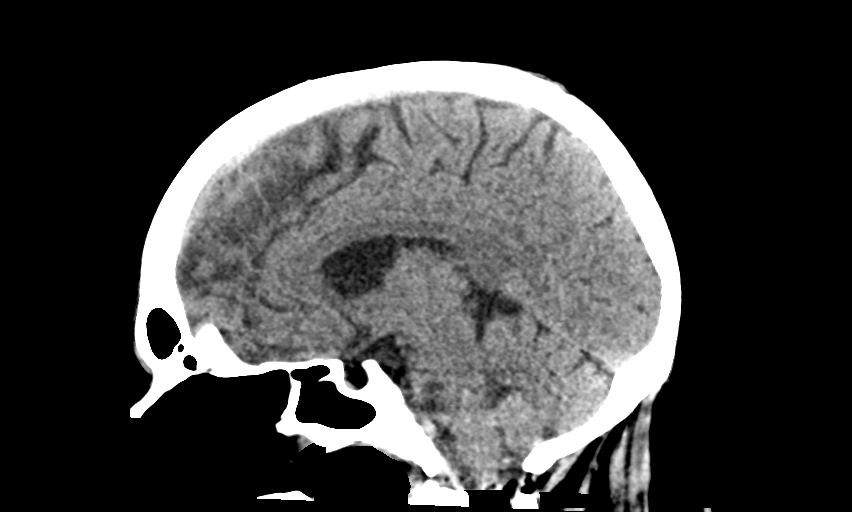
[im 45/67  brain]
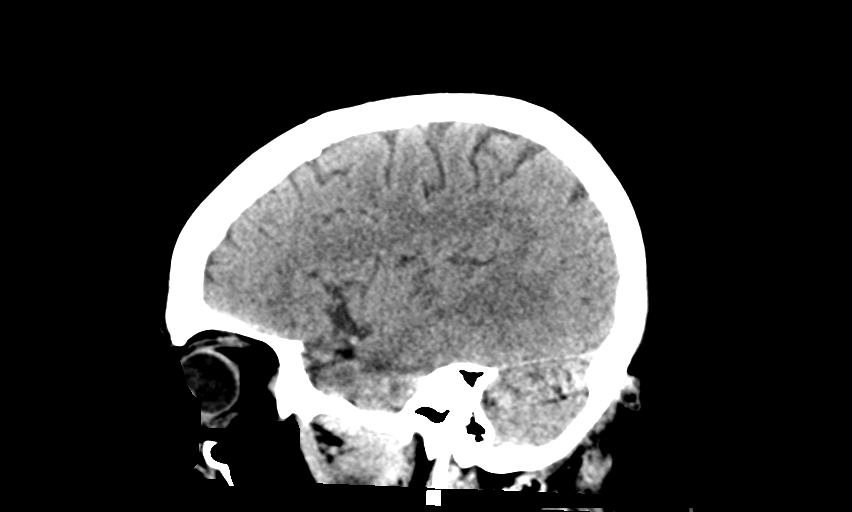

[13 of 47 positions shown; findings below may reference images not displayed]

FINDINGS: Brain: No evidence of acute infarction, hemorrhage, hydrocephalus,
extra-axial collection or mass lesion/mass effect.

Vascular: No hyperdense vessel or unexpected calcification.

Skull: Normal. Negative for fracture or focal lesion.

Sinuses/Orbits: No acute finding.

Other: None.
IMPRESSION: 1. No acute intracranial abnormalities. The appearance of the brain
is normal.

## 2023-06-15 ENCOUNTER — Other Ambulatory Visit (HOSPITAL_BASED_OUTPATIENT_CLINIC_OR_DEPARTMENT_OTHER): Payer: Self-pay

## 2023-06-15 MED ORDER — PHENTERMINE HCL 37.5 MG PO TABS
37.5000 mg | ORAL_TABLET | Freq: Every day | ORAL | 0 refills | Status: DC
  Filled 2023-06-15: qty 30, 30d supply, fill #0

## 2023-06-17 ENCOUNTER — Ambulatory Visit: Payer: Self-pay | Admitting: Hematology & Oncology

## 2023-06-17 ENCOUNTER — Ambulatory Visit (HOSPITAL_COMMUNITY)
Admission: RE | Admit: 2023-06-17 | Discharge: 2023-06-17 | Disposition: A | Discharge status: Home / Self Care | Source: Ambulatory Visit | Attending: Hematology & Oncology | Admitting: Hematology & Oncology

## 2023-06-17 ENCOUNTER — Encounter: Payer: Self-pay | Admitting: *Deleted

## 2023-06-17 DIAGNOSIS — C20 Malignant neoplasm of rectum: Secondary | ICD-10-CM | POA: Insufficient documentation

## 2023-06-26 ENCOUNTER — Inpatient Hospital Stay

## 2023-06-26 ENCOUNTER — Inpatient Hospital Stay: Admitting: Hematology & Oncology

## 2023-06-26 ENCOUNTER — Encounter: Payer: Self-pay | Admitting: Hematology & Oncology

## 2023-06-26 ENCOUNTER — Inpatient Hospital Stay: Attending: Hematology & Oncology

## 2023-06-26 ENCOUNTER — Other Ambulatory Visit: Payer: Self-pay

## 2023-06-26 ENCOUNTER — Other Ambulatory Visit (HOSPITAL_BASED_OUTPATIENT_CLINIC_OR_DEPARTMENT_OTHER): Payer: Self-pay

## 2023-06-26 VITALS — BP 106/73 | HR 78 | Temp 97.7°F | Resp 18 | Ht 66.0 in | Wt 252.3 lb

## 2023-06-26 DIAGNOSIS — K469 Unspecified abdominal hernia without obstruction or gangrene: Secondary | ICD-10-CM | POA: Diagnosis not present

## 2023-06-26 DIAGNOSIS — C20 Malignant neoplasm of rectum: Secondary | ICD-10-CM

## 2023-06-26 DIAGNOSIS — Z452 Encounter for adjustment and management of vascular access device: Secondary | ICD-10-CM | POA: Insufficient documentation

## 2023-06-26 DIAGNOSIS — Z85048 Personal history of other malignant neoplasm of rectum, rectosigmoid junction, and anus: Secondary | ICD-10-CM | POA: Diagnosis not present

## 2023-06-26 DIAGNOSIS — Z9221 Personal history of antineoplastic chemotherapy: Secondary | ICD-10-CM | POA: Insufficient documentation

## 2023-06-26 DIAGNOSIS — Z95828 Presence of other vascular implants and grafts: Secondary | ICD-10-CM

## 2023-06-26 LAB — CMP (CANCER CENTER ONLY)
ALT: 13 U/L (ref 0–44)
AST: 15 U/L (ref 15–41)
Albumin: 4.4 g/dL (ref 3.5–5.0)
Alkaline Phosphatase: 61 U/L (ref 38–126)
Anion gap: 9 (ref 5–15)
BUN: 26 mg/dL — ABNORMAL HIGH (ref 6–20)
CO2: 25 mmol/L (ref 22–32)
Calcium: 8.7 mg/dL — ABNORMAL LOW (ref 8.9–10.3)
Chloride: 102 mmol/L (ref 98–111)
Creatinine: 0.97 mg/dL (ref 0.44–1.00)
GFR, Estimated: >60 mL/min (ref >60)
Glucose, Bld: 93 mg/dL (ref 70–99)
Potassium: 3.2 mmol/L — ABNORMAL LOW (ref 3.5–5.1)
Sodium: 136 mmol/L (ref 135–145)
Total Bilirubin: 0.4 mg/dL (ref 0.0–1.2)
Total Protein: 7.1 g/dL (ref 6.5–8.1)

## 2023-06-26 LAB — CBC WITH DIFFERENTIAL (CANCER CENTER ONLY)
Abs Immature Granulocytes: 0.02 10*3/uL (ref 0.00–0.07)
Basophils Absolute: 0 10*3/uL (ref 0.0–0.1)
Basophils Relative: 0 %
Eosinophils Absolute: 0.2 10*3/uL (ref 0.0–0.5)
Eosinophils Relative: 3 %
HCT: 35.4 % — ABNORMAL LOW (ref 36.0–46.0)
Hemoglobin: 11.8 g/dL — ABNORMAL LOW (ref 12.0–15.0)
Immature Granulocytes: 0 %
Lymphocytes Relative: 26 %
Lymphs Abs: 2.1 10*3/uL (ref 0.7–4.0)
MCH: 28.3 pg (ref 26.0–34.0)
MCHC: 33.3 g/dL (ref 30.0–36.0)
MCV: 84.9 fL (ref 80.0–100.0)
Monocytes Absolute: 0.6 10*3/uL (ref 0.1–1.0)
Monocytes Relative: 7 %
Neutro Abs: 5.4 10*3/uL (ref 1.7–7.7)
Neutrophils Relative %: 64 %
Platelet Count: 289 10*3/uL (ref 150–400)
RBC: 4.17 MIL/uL (ref 3.87–5.11)
RDW: 14.6 % (ref 11.5–15.5)
WBC Count: 8.4 10*3/uL (ref 4.0–10.5)
nRBC: 0 % (ref 0.0–0.2)

## 2023-06-26 LAB — CEA (ACCESS): CEA (CHCC): 2.06 ng/mL (ref 0.00–5.00)

## 2023-06-26 MED ORDER — HEPARIN SOD (PORK) LOCK FLUSH 100 UNIT/ML IV SOLN
500.0000 [IU] | Freq: Once | INTRAVENOUS | Status: AC
  Administered 2023-06-26: 500 [IU] via INTRAVENOUS

## 2023-06-26 MED ORDER — SODIUM CHLORIDE 0.9% FLUSH
10.0000 mL | Freq: Once | INTRAVENOUS | Status: AC
  Administered 2023-06-26: 10 mL via INTRAVENOUS

## 2023-06-26 NOTE — Progress Notes (Signed)
 + Hematology and Oncology Follow Up Visit  BLONDINA CODERRE [id number] [birth year] 58 y.o. 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 Lippert is here today for follow-up.  We last saw her back in March.  Her big problem is that she has this hernia which she had a ostomy back.  This is causing her quite a bit of problems.  It is quite large.  It is somewhat painful for her.  It is certainly affecting her quality of life.  Unfortunately, her rectal surgeon at [facility] is retiring.  She would like to be seen by somebody in [place].  I will have to see if we can get a referral over to surgery in [place].  Her last CEA was normal at 2.47.  We did do a MRI of the rectum.  This was done on 06/17/2023.  Thankfully, the rectal MRI did not show any evidence of recurrence.  I really had to believe that she is going to be cured.  She has had no issues with her bowels or bladder..  She is on a great job losing weight.  She is lost about 20 pounds since we last saw her.  She has had no leg swelling.  She has had no rashes.  Overall, I would say that her performance status is probably ECOG 1.      Medications:  Allergies as of 06/26/2023       Reactions   Sulfamethoxazole Other (See Comments)   Makes me loopy.   Neomycin-bacitracin  Zn-polymyx Rash   Sulfa Antibiotics Other (See Comments)   Acts goofy        Medication List        Accurate as of June 26, 2023  3:42 PM. If you have any questions, ask your nurse or doctor.          STOP taking these medications    cephALEXin  500 MG capsule Commonly known as: KEFLEX  Stopped  by: Ivor Mars       TAKE these medications    acetaminophen  500 MG tablet Commonly known as: TYLENOL  Take 500 mg by mouth every 6 (six) hours as needed.   atorvastatin  20 MG tablet Commonly known as: LIPITOR Take 1 tablet  by mouth daily.   Biotin 10000 MCG Tabs Take by mouth daily.   CALCIUM  1000 + D PO Take by mouth 2 (two) times daily.   cholecalciferol 25 MCG (1000 UNIT) tablet Commonly known as: VITAMIN D3 Take 1,000 Units by mouth daily.   fluticasone  50 MCG/ACT nasal spray Commonly known as: FLONASE  Place 2 sprays into both nostrils daily as needed for allergies.   hydrochlorothiazide  25 MG tablet Commonly known as: HYDRODIURIL  Take 1 tablet (25 mg total) by mouth daily.   losartan  50 MG tablet Commonly known as: COZAAR  Take 1 tablet (50 mg total) by mouth daily.   magnesium 30 MG tablet Take 30 mg by mouth 2 (two) times daily.   metFORMIN  500 MG tablet Commonly known as: GLUCOPHAGE  Take 1 tablet (500 mg total) by mouth daily with breakfast.   nortriptyline  50 MG capsule Commonly known as: PAMELOR   Take 2 capsules (100 mg total) by mouth every night as directed   omeprazole  40 MG capsule Commonly known as: PRILOSEC Take 1 capsule (40 mg total) by mouth daily.   phentermine  37.5 MG tablet Commonly known as: ADIPEX-P  Take 37.5 mg by mouth daily before breakfast. What changed: Another medication with the same name was removed. Continue taking this medication, and follow the directions you see here. Changed by: Ivor Mars   potassium chloride  SA 20 MEQ tablet Commonly known as: KLOR-CON  M Take 1 tablet (20 mEq total) by mouth 2 (two) times daily.   topiramate  100 MG tablet Commonly known as: TOPAMAX  Take 1 tablet by mouth 2  times daily.   vitamin C 1000 MG tablet Take 1,000 mg by mouth daily.   vitamin E 1000 UNIT capsule Take 1,000 Units by mouth daily.        Allergies:  Allergies  Allergen Reactions   Sulfamethoxazole  Other (See Comments)    Makes me loopy.   Neomycin-Bacitracin  Zn-Polymyx Rash   Sulfa Antibiotics Other (See Comments)    Acts goofy    Past Medical History, Surgical history, Social history, and Family History were reviewed and updated.  Review of Systems: Review of Systems  Constitutional: Negative.   HENT: Negative.    Eyes: Negative.   Respiratory: Negative.    Cardiovascular: Negative.   Gastrointestinal: Negative.   Genitourinary: Negative.   Musculoskeletal: Negative.   Skin: Negative.   Neurological: Negative.   Endo/Heme/Allergies: Negative.   Psychiatric/Behavioral: Negative.       Physical Exam:  height is 5' 6 (1.676 m) and weight is 252 lb 4.8 oz (114.4 kg). Her oral temperature is 97.7 F (36.5 C). Her blood pressure is 106/73 and her pulse is 78. Her respiration is 18 and oxygen saturation is 100%.   Wt Readings from Last 3 Encounters:  06/26/23 252 lb 4.8 oz (114.4 kg)  05/14/23 259 lb 9.6 oz (117.8 kg)  03/26/23 270 lb (122.5 kg)   Physical Exam Vitals reviewed.  HENT:     Head: Normocephalic and atraumatic.   Eyes:     Pupils: Pupils are equal, round, and reactive to light.    Cardiovascular:     Rate and Rhythm: Normal rate and regular rhythm.     Heart sounds: Normal heart sounds.  Pulmonary:     Effort: Pulmonary effort is normal.     Breath sounds: Normal breath sounds.  Abdominal:     General: Bowel sounds are normal.     Palpations: Abdomen is soft.     Comments: Abdominal exam shows a relatively large hernia in the right lower quadrant.  This is underneath where she had the scar for ostomy.  It is somewhat reducible.   Musculoskeletal:        General: No tenderness or deformity. Normal range of motion.     Cervical back: Normal range of motion.  Lymphadenopathy:     Cervical: No cervical adenopathy.   Skin:    General: Skin is warm and dry.     Findings: No erythema or rash.   Neurological:     Mental Status: She is alert  and oriented to person, place, and time.   Psychiatric:        Behavior: Behavior normal.        Thought Content: Thought content normal.        Judgment: Judgment normal.       Lab Results  Component Value Date  WBC 8.4 06/26/2023   HGB 11.8 (L) 06/26/2023   HCT 35.4 (L) 06/26/2023   MCV 84.9 06/26/2023   PLT 289 06/26/2023   Lab Results  Component Value Date   FERRITIN 99 06/17/2021   IRON 48 06/17/2021   TIBC 311 06/17/2021   UIBC 263 06/17/2021   IRONPCTSAT 15 06/17/2021   Lab Results  Component Value Date   RBC 4.17 06/26/2023   No results found for: KPAFRELGTCHN, LAMBDASER, KAPLAMBRATIO No results found for: IGGSERUM, IGA, IGMSERUM No results found for: [place] Aliment, MSPIKE, SPEI   Chemistry      Component Value Date/Time   NA 139 03/26/2023 1500   K 3.8 03/26/2023 1500   CL 103 03/26/2023 1500   CO2 26 03/26/2023 1500   BUN 24 (H) 03/26/2023 1500   CREATININE 0.92 03/26/2023 1500   CREATININE 0.84 11/15/2019 1516      Component Value Date/Time   CALCIUM  9.3 03/26/2023 1500   ALKPHOS 73 03/26/2023 1500   AST 16 03/26/2023 1500   ALT 12 03/26/2023 1500   BILITOT 0.4 03/26/2023 1500       Impression and Plan: Ms. Deupree is a very charming 58 yo white female with localized rectal cancer, stage IIA.   We tried her on neoadjuvant chemotherapy which she tolerated incredibly poorly.  As such, we finally did get her to surgery.  She had no residual disease at surgery.  Again, this hernia is a real problem for her.  I really feel bad for her.  I know this is affecting her quality of life.  Hopefully, she will be able to get one of the surgeons in [place] to help fix this.  I do not think we have to do another MRI probably until the Holiday season.  We would like to going get her back in about 3 months..  She does have a Port-A-Cath that we still have to flush.   Ivor Mars, MD 6/13/20253:42 PM

## 2023-06-26 NOTE — Patient Instructions (Signed)

## 2023-06-27 ENCOUNTER — Ambulatory Visit: Payer: Self-pay | Admitting: Hematology & Oncology

## 2023-06-29 ENCOUNTER — Telehealth: Payer: Self-pay

## 2023-06-29 NOTE — Telephone Encounter (Signed)
 Received voicemail from patient asking about the referral to surgery for her hernia repair. This RN attempted to call patient back with no answer; VM left letting patient know that Dr. Maria Shiner is still trying to get in touch with the surgeon and she is in office 06/30/2023.

## 2023-07-10 ENCOUNTER — Other Ambulatory Visit (HOSPITAL_BASED_OUTPATIENT_CLINIC_OR_DEPARTMENT_OTHER): Payer: Self-pay

## 2023-07-10 MED ORDER — PHENTERMINE HCL 37.5 MG PO TABS
37.5000 mg | ORAL_TABLET | Freq: Every morning | ORAL | 0 refills | Status: DC
  Filled 2023-07-15 – 2023-07-16 (×2): qty 30, 30d supply, fill #0

## 2023-07-13 ENCOUNTER — Other Ambulatory Visit: Payer: Self-pay | Admitting: General Surgery

## 2023-07-13 DIAGNOSIS — K432 Incisional hernia without obstruction or gangrene: Secondary | ICD-10-CM

## 2023-07-15 ENCOUNTER — Other Ambulatory Visit (HOSPITAL_BASED_OUTPATIENT_CLINIC_OR_DEPARTMENT_OTHER): Payer: Self-pay

## 2023-07-16 ENCOUNTER — Other Ambulatory Visit: Payer: Self-pay

## 2023-07-16 ENCOUNTER — Other Ambulatory Visit (HOSPITAL_BASED_OUTPATIENT_CLINIC_OR_DEPARTMENT_OTHER): Payer: Self-pay

## 2023-07-22 ENCOUNTER — Ambulatory Visit
Admission: RE | Admit: 2023-07-22 | Discharge: 2023-07-22 | Disposition: A | Discharge status: Home / Self Care | Source: Ambulatory Visit | Attending: General Surgery | Admitting: General Surgery

## 2023-07-22 DIAGNOSIS — K435 Parastomal hernia without obstruction or  gangrene: Secondary | ICD-10-CM | POA: Diagnosis not present

## 2023-07-22 DIAGNOSIS — K432 Incisional hernia without obstruction or gangrene: Secondary | ICD-10-CM

## 2023-07-22 MED ORDER — IOPAMIDOL (ISOVUE-300) INJECTION 61%
100.0000 mL | Freq: Once | INTRAVENOUS | Status: AC | PRN
  Administered 2023-07-22: 100 mL via INTRAVENOUS

## 2023-08-03 ENCOUNTER — Other Ambulatory Visit: Payer: Self-pay

## 2023-08-03 ENCOUNTER — Other Ambulatory Visit (HOSPITAL_BASED_OUTPATIENT_CLINIC_OR_DEPARTMENT_OTHER): Payer: Self-pay

## 2023-08-03 ENCOUNTER — Other Ambulatory Visit: Payer: Self-pay | Admitting: Neurology

## 2023-08-03 DIAGNOSIS — G932 Benign intracranial hypertension: Secondary | ICD-10-CM

## 2023-08-03 MED ORDER — TOPIRAMATE 100 MG PO TABS
100.0000 mg | ORAL_TABLET | Freq: Two times a day (BID) | ORAL | 3 refills | Status: DC
Start: 2023-08-03 — End: 2023-09-28
  Filled 2023-08-03: qty 180, 90d supply, fill #0

## 2023-08-12 ENCOUNTER — Ambulatory Visit: Payer: Self-pay | Admitting: General Surgery

## 2023-08-12 DIAGNOSIS — K432 Incisional hernia without obstruction or gangrene: Secondary | ICD-10-CM | POA: Diagnosis not present

## 2023-08-12 NOTE — H&P (Signed)
 Chief Complaint: Recheck (discuss rob hernia repair for incisional hernia/)     History of Present Illness: Janice Lucas is a 58 y.o. female who is seen today as an office consultation at the request of Dr. Arch for evaluation of Recheck (discuss rob hernia repair for incisional hernia/) .    History of Present Illness Janice Lucas is a 58 year old female who presents for surgical consultation regarding a hernia.  She has a hernia at the ostomy takedown site, causing discomfort and pain, especially when lifting objects. The hernia does not retract on its own and is aggravated by physical activity. It has been present for some time and is described as aggravating and painful.  She has undergone multiple abdominal surgeries, including minimally invasive procedures and one open surgery for cancer resection. The hernia has become more prominent with recent weight loss, attributed to lifestyle changes and medication use.  She is currently taking phentermine  for weight loss and weighs approximately 200 pounds. Her social history includes a physically active lifestyle, with a job requiring walking seven to eight miles a day and engaging in yard work. She has made significant dietary changes, avoiding bread, pasta, and junk food, with occasional treats.     Review of Systems: A complete review of systems was obtained from the patient.  I have reviewed this information and discussed as appropriate with the patient.  See HPI as well for other ROS.  Review of Systems  Constitutional:  Negative for fever.  HENT:  Negative for congestion.   Eyes:  Negative for blurred vision.  Respiratory:  Negative for cough, shortness of breath and wheezing.   Cardiovascular:  Negative for chest pain and palpitations.  Gastrointestinal:  Negative for heartburn.  Genitourinary:  Negative for dysuria.  Musculoskeletal:  Negative for myalgias.  Skin:  Negative for rash.  Neurological:  Negative for dizziness  and headaches.  Psychiatric/Behavioral:  Negative for depression and suicidal ideas.   All other systems reviewed and are negative.     Medical History: Past Medical History: Diagnosis Date  Diabetes mellitus without complication (CMS/HHS-HCC)   GERD (gastroesophageal reflux disease)   History of cancer   Hyperlipidemia   Hypertension   Sleep apnea    There is no problem list on file for this patient.   Past Surgical History: Procedure Laterality Date  COLON SURGERY    SPINE SURGERY      Allergies Allergen Reactions  Sulfamethoxazole Other (See Comments)   Makes me loopy.  Neomycin-Polymyxin Rash   Current Outpatient Medications on File Prior to Visit Medication Sig Dispense Refill  ascorbic acid, vitamin C, (VITAMIN C) 1000 MG tablet Take 1,000 mg by mouth once daily    atorvastatin  (LIPITOR) 20 MG tablet Take 20 mg by mouth once daily    fluticasone  propionate (FLONASE ) 50 mcg/actuation nasal spray Place 2 sprays into one nostril    hydroCHLOROthiazide  (HYDRODIURIL ) 25 MG tablet Take 25 mg by mouth once daily    losartan  (COZAAR ) 50 MG tablet Take 50 mg by mouth once daily    metFORMIN  (GLUCOPHAGE ) 500 MG tablet Take 500 mg by mouth daily with breakfast    nortriptyline  (PAMELOR ) 50 MG capsule Take 100 mg by mouth    omeprazole  (PRILOSEC) 40 MG DR capsule Take 40 mg by mouth once daily    potassium chloride  (KLOR-CON  M20) 20 MEQ ER tablet Take 20 mEq by mouth 2 (two) times daily    topiramate  (TOPAMAX ) 100 MG tablet Take 100 mg by mouth  2 (two) times daily    No current facility-administered medications on file prior to visit.   Family History Problem Relation Age of Onset  High blood pressure (Hypertension) Mother   Hyperlipidemia (Elevated cholesterol) Mother   Diabetes Mother   Skin cancer Father   Hyperlipidemia (Elevated cholesterol) Father   Prostate cancer Father   Diabetes Brother     Social History  Tobacco Use Smoking Status Every  Day  Types: Cigarettes Smokeless Tobacco Not on file    Social History  Socioeconomic History  Marital status: Married Tobacco Use  Smoking status: Every Day   Types: Cigarettes Vaping Use  Vaping status: Unknown Substance and Sexual Activity  Alcohol  use: Yes   Alcohol /week: 0.0 - 1.0 standard drinks of alcohol   Drug use: Never  Sexual activity: Defer  Social Drivers of Health  Financial Resource Strain: Low Risk  (01/12/2023)  Received from [facility]  Overall Financial Resource Strain (CARDIA)   Difficulty of Paying Living Expenses: Not hard at all Food Insecurity: No Food Insecurity (01/12/2023)  Received from [facility]  Hunger Vital Sign   Within the past 12 months, you worried that your food would run out before you got the money to buy more.: Never true   Within the past 12 months, the food you bought just didn't last and you didn't have money to get more.: Never true Transportation Needs: No Transportation Needs (01/12/2023)  Received from [facility] - Transportation   Lack of Transportation (Medical): No   Lack of Transportation (Non-Medical): No Physical Activity: Unknown (01/12/2023)  Received from [facility]  Exercise Vital Sign   On average, how many days per week do you engage in moderate to strenuous exercise (like a brisk walk)?: 0 days Stress: No Stress Concern Present (01/12/2023)  Received from [facility] - Occupational Stress Questionnaire   Feeling of Stress : Not at all Social Connections: Socially Isolated (01/12/2023)  [facility] from [facility]  Social Connection and Isolation Panel   In a typical week, how many times do you talk on the phone with family, friends, or neighbors?: More than three times a week   How often do you get together with friends or relatives?: More than three times a week   How often do you attend church or religious services?: Never   Do you belong to any clubs or  organizations such as church groups, unions, fraternal or athletic groups, or school groups?: No   Are you married, widowed, divorced, separated, never married, or living with a partner?: Separated Housing Stability: Unknown (07/13/2023)  Housing Stability Vital Sign   Homeless in the Last Year: No   Objective:   Vitals:  08/12/23 1038 PainSc: 0-No pain   There is no height or weight on file to calculate BMI.  Physical Exam Constitutional:      Appearance: Normal appearance.  HENT:     Head: Normocephalic and atraumatic.     Mouth/Throat:     Mouth: Mucous membranes are moist.     Pharynx: Oropharynx is clear.  Eyes:     General: No scleral icterus.    Pupils: Pupils are equal, round, and reactive to light.  Cardiovascular:     Rate and Rhythm: Normal rate and regular rhythm.     Pulses: Normal pulses.     Heart sounds: No murmur heard.    No friction rub. No gallop.  Pulmonary:     Effort: Pulmonary effort is  normal. No respiratory distress.     Breath sounds: Normal breath sounds. No stridor.  Abdominal:     General: Abdomen is flat.     Hernia: A hernia is present.    Musculoskeletal:        General: No swelling.  Skin:    General: Skin is warm.  Neurological:     General: No focal deficit present.     Mental Status: She is alert and oriented to person, place, and time. Mental status is at baseline.  Psychiatric:        Mood and Affect: Mood normal.        Thought Content: Thought content normal.        Judgment: Judgment normal.      Hernia Size:4x6cm Incarcerated: no Initial Hernia   Assessment and Plan: Diagnoses and all orders for this visit:  Incisional hernia without obstruction or gangrene    Janice Lucas is a 58 y.o. female   1.  We will proceed to the OR for a robotic ventral hernia repair with mesh, likely eTEP 2. All risks and benefits were discussed with the patient, to generally include infection, bleeding, damage to surrounding  structures, acute and chronic nerve pain, and recurrence. Alternatives were offered and described.  All questions were answered and the patient voiced understanding of the procedure and wishes to proceed at this point.       No follow-ups on file.  Lynda Leos, MD, [facility], [place] [facility] Minimally Invasive Surgery

## 2023-08-18 ENCOUNTER — Inpatient Hospital Stay: Attending: Hematology & Oncology

## 2023-08-18 DIAGNOSIS — Z9221 Personal history of antineoplastic chemotherapy: Secondary | ICD-10-CM | POA: Insufficient documentation

## 2023-08-18 DIAGNOSIS — Z85048 Personal history of other malignant neoplasm of rectum, rectosigmoid junction, and anus: Secondary | ICD-10-CM | POA: Diagnosis not present

## 2023-08-18 DIAGNOSIS — E119 Type 2 diabetes mellitus without complications: Secondary | ICD-10-CM | POA: Insufficient documentation

## 2023-08-18 DIAGNOSIS — Z452 Encounter for adjustment and management of vascular access device: Secondary | ICD-10-CM | POA: Insufficient documentation

## 2023-08-18 NOTE — Progress Notes (Unsigned)
 [facility] [address] [place], [place] [address] [phone]  Date:  08/20/2023   Name:  Janice Lucas   DOB:  [birth year]   MRN:  [id number]  PCP:  Janice Harlene BROCKS, MD    Chief Complaint: No chief complaint on file.   History of Present Illness:  Janice Lucas is a 58 y.o. very pleasant female patient who presents with the following:  Pt with history of HTN, migraine HA, hyperlipidemia, intracranial hypertension/ pseudotumor cerebri dx in 2015, mild diabetes.  She had a colonoscopy in March 2023 and was diagnosed with localized rectal cancer   Patient seen today with concern of UTI.  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   Class 3 severe obesity due to excess calories with serious comorbidity and body mass index (BMI) of 45.0 to 49.9 in adult 11/19/2022   Insufficiency fracture of medial femoral condyle (HCC) 04/17/2022   Degenerative tear of left medial meniscus 04/17/2022   Estrogen deficiency 04/17/2022   Rectal cancer (HCC) 04/26/2021   Goals of care, counseling/discussion 04/26/2021   Allergic rhinitis 05/15/2020   Hypertension 07/05/2018   Migraine without aura and without status migrainosus, not intractable 02/08/2016   Depression 10/10/2015   Injury by electrocution 10/10/2015   Mixed hyperlipidemia 10/10/2015   Prediabetes 10/10/2015   Urinary incontinence 10/10/2015   Sleep apnea 08/30/2015   IIH  (idiopathic intracranial hypertension) 11/14/2013   Herniated nucleus pulposus, C6-7 right 02/14/2011    Past Medical History:  Diagnosis Date   Allergy    SEASONAL - MILD   Diabetes mellitus without complication (HCC)    diet controlled/with meds   GERD (gastroesophageal reflux disease)    ON OMEPRAZOLE    Goals of care, counseling/discussion 04/26/2021   Hyperlipidemia    ON MEDS   Hypertension    ON MEDS   Rectal cancer (HCC) 04/26/2021   Sleep apnea    WEARS CPAP    Past Surgical History:  Procedure Laterality Date   ANTERIOR CERVICAL DECOMP/DISCECTOMY FUSION  02/14/2011   Procedure: ANTERIOR CERVICAL DECOMPRESSION/DISCECTOMY FUSION 1 LEVEL;  Surgeon: Victory JINNY Gens, MD;  Location: [facility];  Service: Neurosurgery;  Laterality: N/A;  Anterior Cervical Six-Seven Decompression and Fusion   COLON SURGERY  09/2021   COLON SURGERY  12/2021   IR IMAGING GUIDED PORT INSERTION  05/02/2021    Social History   Tobacco Use   Smoking status: Former    Current packs/day: 1.00    Average packs/day: 1 pack/day for 20.0 years (20.0 ttl pk-yrs)    Types: Cigarettes   Smokeless tobacco: Never   Tobacco comments:    VAPES   Vaping Use   Vaping status: Every Day   Substances: Nicotine, Flavoring  Substance Use Topics   Alcohol  use: No    Comment: occassionally   Drug use: Not Currently    Family History  Problem Relation Age of Onset   Colon polyps Father  Prostate cancer Father        METS TO LYMPH NODES AND BLADDER   Diabetes Maternal Aunt    Diabetes Maternal Uncle    Colon cancer Neg Hx    Esophageal cancer Neg Hx    Rectal cancer Neg Hx    Stomach cancer Neg Hx     Allergies  Allergen Reactions   Sulfamethoxazole Other (See Comments)    Makes me loopy.   Neomycin-Bacitracin  Zn-Polymyx Rash   Sulfa Antibiotics Other (See Comments)    Acts goofy    Medication list has been reviewed and updated.  Current Outpatient Medications on File Prior to Visit   Medication Sig Dispense Refill   acetaminophen  (TYLENOL ) 500 MG tablet Take 500 mg by mouth every 6 (six) hours as needed.     Ascorbic Acid (VITAMIN C) 1000 MG tablet Take 1,000 mg by mouth daily.     atorvastatin  (LIPITOR) 20 MG tablet Take 1 tablet  by mouth daily. 90 tablet 3   Biotin 89999 MCG TABS Take by mouth daily.     Calcium  Carb-Cholecalciferol (CALCIUM  1000 + D PO) Take by mouth 2 (two) times daily.     cholecalciferol (VITAMIN D3) 25 MCG (1000 UT) tablet Take 1,000 Units by mouth daily.     fluticasone  (FLONASE ) 50 MCG/ACT nasal spray Place 2 sprays into both nostrils daily as needed for allergies. 16 g 11   hydrochlorothiazide  (HYDRODIURIL ) 25 MG tablet Take 1 tablet (25 mg total) by mouth daily. 90 tablet 3   losartan  (COZAAR ) 50 MG tablet Take 1 tablet (50 mg total) by mouth daily. 90 tablet 3   magnesium 30 MG tablet Take 30 mg by mouth 2 (two) times daily.     metFORMIN  (GLUCOPHAGE ) 500 MG tablet Take 1 tablet (500 mg total) by mouth daily with breakfast. 90 tablet 3   nortriptyline  (PAMELOR ) 50 MG capsule Take 2 capsules (100 mg total) by mouth every night as directed 180 capsule 3   omeprazole  (PRILOSEC) 40 MG capsule Take 1 capsule (40 mg total) by mouth daily. 90 capsule 1   phentermine  (ADIPEX-P ) 37.5 MG tablet Take 37.5 mg by mouth daily before breakfast.     phentermine  (ADIPEX-P ) 37.5 MG tablet Take 1 tablet (37.5 mg total) by mouth in the morning. 30 tablet 0   potassium chloride  SA (KLOR-CON  M) 20 MEQ tablet Take 1 tablet (20 mEq total) by mouth 2 (two) times daily. 60 tablet 6   topiramate  (TOPAMAX ) 100 MG tablet Take 1 tablet (100 mg total) by mouth 2 (two) times daily. 180 tablet 3   vitamin E 1000 UNIT capsule Take 1,000 Units by mouth daily.     [DISCONTINUED] prochlorperazine  (COMPAZINE ) 10 MG tablet Take 1 tablet (10 mg total) by mouth every 6 (six) hours as needed (Nausea or vomiting). 30 tablet 1   No current facility-administered medications on file prior  to visit.    Review of Systems:  As per HPI- otherwise negative.   Physical Examination: There were no vitals filed for this visit. There were no vitals filed for this visit. There is no height or weight on file to calculate BMI. Ideal Body Weight:    GEN: no acute distress. HEENT: Atraumatic, Normocephalic.  Ears and Nose: No external deformity. CV: RRR, No M/G/R. No JVD. No thrill. No extra heart sounds. PULM: CTA B, no wheezes, crackles, rhonchi. No retractions. No resp. distress. No accessory muscle use. ABD: S, NT, ND, +BS. No rebound. No HSM. EXTR: No  c/c/e PSYCH: Normally interactive. Conversant.    Assessment and Plan: ***  Signed Harlene Schroeder, MD

## 2023-08-20 ENCOUNTER — Other Ambulatory Visit (HOSPITAL_BASED_OUTPATIENT_CLINIC_OR_DEPARTMENT_OTHER): Payer: Self-pay

## 2023-08-20 ENCOUNTER — Ambulatory Visit: Admitting: Family Medicine

## 2023-08-20 VITALS — BP 114/78 | HR 80 | Ht 66.0 in | Wt 245.0 lb

## 2023-08-20 DIAGNOSIS — E118 Type 2 diabetes mellitus with unspecified complications: Secondary | ICD-10-CM

## 2023-08-20 DIAGNOSIS — M79604 Pain in right leg: Secondary | ICD-10-CM

## 2023-08-20 DIAGNOSIS — I10 Essential (primary) hypertension: Secondary | ICD-10-CM

## 2023-08-20 DIAGNOSIS — R350 Frequency of micturition: Secondary | ICD-10-CM

## 2023-08-20 DIAGNOSIS — E785 Hyperlipidemia, unspecified: Secondary | ICD-10-CM

## 2023-08-20 DIAGNOSIS — E119 Type 2 diabetes mellitus without complications: Secondary | ICD-10-CM

## 2023-08-20 MED ORDER — CYCLOBENZAPRINE HCL 10 MG PO TABS
10.0000 mg | ORAL_TABLET | Freq: Two times a day (BID) | ORAL | 0 refills | Status: AC | PRN
  Filled 2023-08-20: qty 30, 15d supply, fill #0

## 2023-08-20 MED ORDER — METFORMIN HCL 500 MG PO TABS
500.0000 mg | ORAL_TABLET | Freq: Every day | ORAL | 3 refills | Status: DC
  Filled 2023-08-20 – 2023-10-29 (×2): qty 90, 90d supply, fill #0

## 2023-08-20 MED ORDER — LOSARTAN POTASSIUM 50 MG PO TABS
50.0000 mg | ORAL_TABLET | Freq: Every day | ORAL | 3 refills | Status: AC
  Filled 2023-08-20: qty 90, 90d supply, fill #0
  Filled 2023-10-05 – 2023-12-13 (×2): qty 90, 90d supply, fill #1

## 2023-08-20 NOTE — Patient Instructions (Signed)
 It was good to see you today- best of luck with your operation!    Try the muscle relaxer for a few days for your back.  Watch out for sedation and do not take with your potassium  Ok to use tylenol  and lidocaine  patches as needed  You can try some of the stretches as well Let me know if not getting better in the next few days- Sooner if worse.   I will be in touch with your labs

## 2023-08-21 ENCOUNTER — Encounter: Payer: Self-pay | Admitting: Family Medicine

## 2023-08-21 DIAGNOSIS — E785 Hyperlipidemia, unspecified: Secondary | ICD-10-CM

## 2023-08-21 LAB — COMPREHENSIVE METABOLIC PANEL WITH GFR
ALT: 15 U/L (ref 0–35)
AST: 18 U/L (ref 0–37)
Albumin: 4.4 g/dL (ref 3.5–5.2)
Alkaline Phosphatase: 81 U/L (ref 39–117)
BUN: 26 mg/dL — ABNORMAL HIGH (ref 6–23)
CO2: 24 meq/L (ref 19–32)
Calcium: 8.7 mg/dL (ref 8.4–10.5)
Chloride: 99 meq/L (ref 96–112)
Creatinine, Ser: 0.86 mg/dL (ref 0.40–1.20)
GFR: 74.78 mL/min (ref >60.00)
Glucose, Bld: 100 mg/dL — ABNORMAL HIGH (ref 70–99)
Potassium: 3.6 meq/L (ref 3.5–5.1)
Sodium: 136 meq/L (ref 135–145)
Total Bilirubin: 0.4 mg/dL (ref 0.2–1.2)
Total Protein: 7.3 g/dL (ref 6.0–8.3)

## 2023-08-21 LAB — LIPID PANEL
Cholesterol: 196 mg/dL (ref 0–200)
HDL: 54.3 mg/dL (ref >39.00)
LDL Cholesterol: 122 mg/dL — ABNORMAL HIGH (ref 0–99)
NonHDL: 141.61
Total CHOL/HDL Ratio: 4
Triglycerides: 97 mg/dL (ref 0.0–149.0)
VLDL: 19.4 mg/dL (ref 0.0–40.0)

## 2023-08-21 LAB — HEMOGLOBIN A1C: Hgb A1c MFr Bld: 6.5 % (ref 4.6–6.5)

## 2023-08-23 ENCOUNTER — Other Ambulatory Visit (HOSPITAL_BASED_OUTPATIENT_CLINIC_OR_DEPARTMENT_OTHER): Payer: Self-pay

## 2023-08-23 MED ORDER — ATORVASTATIN CALCIUM 40 MG PO TABS
40.0000 mg | ORAL_TABLET | Freq: Every day | ORAL | 3 refills | Status: AC
  Filled 2023-08-23 – 2023-09-11 (×2): qty 90, 90d supply, fill #0
  Filled 2023-12-13: qty 90, 90d supply, fill #1

## 2023-08-24 ENCOUNTER — Other Ambulatory Visit (HOSPITAL_BASED_OUTPATIENT_CLINIC_OR_DEPARTMENT_OTHER): Payer: Self-pay

## 2023-09-07 ENCOUNTER — Other Ambulatory Visit (HOSPITAL_BASED_OUTPATIENT_CLINIC_OR_DEPARTMENT_OTHER): Payer: Self-pay

## 2023-09-11 ENCOUNTER — Other Ambulatory Visit (HOSPITAL_BASED_OUTPATIENT_CLINIC_OR_DEPARTMENT_OTHER): Payer: Self-pay

## 2023-09-11 ENCOUNTER — Other Ambulatory Visit: Payer: Self-pay

## 2023-09-18 ENCOUNTER — Inpatient Hospital Stay (HOSPITAL_BASED_OUTPATIENT_CLINIC_OR_DEPARTMENT_OTHER): Admitting: Family

## 2023-09-18 ENCOUNTER — Ambulatory Visit

## 2023-09-18 ENCOUNTER — Inpatient Hospital Stay: Attending: Hematology & Oncology

## 2023-09-18 ENCOUNTER — Other Ambulatory Visit

## 2023-09-18 ENCOUNTER — Ambulatory Visit: Admitting: Hematology & Oncology

## 2023-09-18 DIAGNOSIS — Z85048 Personal history of other malignant neoplasm of rectum, rectosigmoid junction, and anus: Secondary | ICD-10-CM | POA: Diagnosis not present

## 2023-09-18 DIAGNOSIS — Z9221 Personal history of antineoplastic chemotherapy: Secondary | ICD-10-CM | POA: Diagnosis not present

## 2023-09-18 DIAGNOSIS — C20 Malignant neoplasm of rectum: Secondary | ICD-10-CM

## 2023-09-18 LAB — CMP (CANCER CENTER ONLY)
ALT: 17 U/L (ref 0–44)
AST: 23 U/L (ref 15–41)
Albumin: 4.2 g/dL (ref 3.5–5.0)
Alkaline Phosphatase: 83 U/L (ref 38–126)
Anion gap: 13 (ref 5–15)
BUN: 26 mg/dL — ABNORMAL HIGH (ref 6–20)
CO2: 22 mmol/L (ref 22–32)
Calcium: 8.5 mg/dL — ABNORMAL LOW (ref 8.9–10.3)
Chloride: 100 mmol/L (ref 98–111)
Creatinine: 0.77 mg/dL (ref 0.44–1.00)
GFR, Estimated: >60 mL/min (ref >60)
Glucose, Bld: 120 mg/dL — ABNORMAL HIGH (ref 70–99)
Potassium: 3.2 mmol/L — ABNORMAL LOW (ref 3.5–5.1)
Sodium: 135 mmol/L (ref 135–145)
Total Bilirubin: 0.4 mg/dL (ref 0.0–1.2)
Total Protein: 7.1 g/dL (ref 6.5–8.1)

## 2023-09-18 LAB — CBC WITH DIFFERENTIAL (CANCER CENTER ONLY)
Abs Immature Granulocytes: 0.02 K/uL (ref 0.00–0.07)
Basophils Absolute: 0 K/uL (ref 0.0–0.1)
Basophils Relative: 0 %
Eosinophils Absolute: 0.3 K/uL (ref 0.0–0.5)
Eosinophils Relative: 3 %
HCT: 36.6 % (ref 36.0–46.0)
Hemoglobin: 12.3 g/dL (ref 12.0–15.0)
Immature Granulocytes: 0 %
Lymphocytes Relative: 30 %
Lymphs Abs: 2.4 K/uL (ref 0.7–4.0)
MCH: 28.5 pg (ref 26.0–34.0)
MCHC: 33.6 g/dL (ref 30.0–36.0)
MCV: 84.9 fL (ref 80.0–100.0)
Monocytes Absolute: 0.5 K/uL (ref 0.1–1.0)
Monocytes Relative: 6 %
Neutro Abs: 5 K/uL (ref 1.7–7.7)
Neutrophils Relative %: 61 %
Platelet Count: 255 K/uL (ref 150–400)
RBC: 4.31 MIL/uL (ref 3.87–5.11)
RDW: 14 % (ref 11.5–15.5)
WBC Count: 8.3 K/uL (ref 4.0–10.5)
nRBC: 0 % (ref 0.0–0.2)

## 2023-09-18 LAB — CEA (ACCESS): CEA (CHCC): 2.68 ng/mL (ref 0.00–5.00)

## 2023-09-18 NOTE — Patient Instructions (Signed)

## 2023-09-20 NOTE — Progress Notes (Signed)
 Hematology and Oncology Follow Up Visit  Janice Lucas [id number] Apr 06, 1965 58 y.o. 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 is here today for follow-up. She is FINALLY scheduled for ventral hernia repair on 10/06/2023. She has some abdominal pain with sitting up and other movements placing pressure on the mid abdomen.  No fever, chills, n/v, cough, rash, dizziness, SOB, chest pain, palpitations or changes in bowel or bladder habits.  No swelling in her extremities. She does does experience hip pain with right sided sciatica.  No falls or syncope reported.  Appetite and hydration are good. Weight is stable at 248 lbs.  She has not noted any blood loss. No bruising or petechiae.  CEA pending. Last visit was stable at 2.06.  ECOG Performance Status: 1 - Symptomatic but completely ambulatory  Medications:  Allergies as of 09/18/2023       Reactions   Sulfamethoxazole Other (See Comments)   Makes me loopy.   Neomycin-bacitracin  Zn-polymyx Rash   Sulfa Antibiotics Other (See Comments)   Acts goofy        Medication List        Accurate as of September 18, 2023 11:59 PM. If you have any questions, ask your nurse or doctor.          acetaminophen  500 MG tablet Commonly known as: TYLENOL  Take 500 mg by mouth every 6 (six) hours as needed.   atorvastatin  40 MG tablet Commonly known as: LIPITOR Take 1 tablet (40 mg total) by mouth daily.   Biotin 10000 MCG Tabs Take by mouth daily.   CALCIUM  1000 + D PO Take by mouth 2 (two) times daily.   cholecalciferol 25 MCG (1000 UNIT) tablet Commonly known  as: VITAMIN D3 Take 1,000 Units by mouth daily.   cyclobenzaprine  10 MG tablet Commonly known as: FLEXERIL  Take 1 tablet (10 mg total) by mouth 2 (two) times daily as needed for muscle spasms.   fluticasone  50 MCG/ACT nasal spray Commonly known as: FLONASE  Place 2 sprays into both nostrils daily as needed for allergies.   hydrochlorothiazide  25 MG tablet Commonly known as: HYDRODIURIL  Take 1 tablet (25 mg total) by mouth daily.   losartan  50 MG tablet Commonly known as: COZAAR  Take 1 tablet (50 mg total) by mouth daily.   magnesium 30 MG tablet Take 30 mg by mouth 2 (two) times daily.   metFORMIN  500 MG tablet Commonly known as: GLUCOPHAGE  Take 1 tablet (500 mg total) by mouth daily with breakfast.   nortriptyline  50 MG capsule Commonly known as: PAMELOR  Take 2 capsules (100 mg total) by mouth every night as directed   omeprazole  40 MG capsule Commonly known as: PRILOSEC Take 1 capsule (40 mg total) by mouth daily.   phentermine  37.5 MG tablet Commonly known as: ADIPEX-P  Take 37.5 mg by mouth daily before breakfast.   phentermine  37.5 MG tablet Commonly known as: ADIPEX-P  Take 1 tablet (37.5 mg total) by mouth in the morning.   potassium chloride  SA 20 MEQ tablet Commonly known as: KLOR-CON  M Take 1 tablet (20 mEq total) by mouth 2 (two) times daily.  topiramate  100 MG tablet Commonly known as: TOPAMAX  Take 1 tablet (100 mg total) by mouth 2 (two) times daily.   vitamin C 1000 MG tablet Take 1,000 mg by mouth daily.   vitamin E 1000 UNIT capsule Take 1,000 Units by mouth daily.        Allergies:  Allergies  Allergen Reactions   Sulfamethoxazole Other (See Comments)    Makes me loopy.   Neomycin-Bacitracin  Zn-Polymyx Rash   Sulfa Antibiotics Other (See Comments)    Acts goofy    Past Medical History, Surgical history, Social history, and Family History were reviewed and updated.  Review of Systems: All other 10 point review of systems is  negative.   Physical Exam:  weight is 248 lb 1.3 oz (112.5 kg). Her oral temperature is 97.8 F (36.6 C). Her blood pressure is 109/86 and her pulse is 82. Her respiration is 19.   Wt Readings from Last 3 Encounters:  09/18/23 248 lb 1.3 oz (112.5 kg)  08/20/23 245 lb (111.1 kg)  06/26/23 252 lb 4.8 oz (114.4 kg)    Ocular: Sclerae unicteric, pupils equal, round and reactive to light Ear-nose-throat: Oropharynx clear, dentition fair Lymphatic: No cervical or supraclavicular adenopathy Lungs no rales or rhonchi, good excursion bilaterally Heart regular rate and rhythm, no murmur appreciated Abd soft, ventral hernia visible, nontender on exam, positive bowel sounds MSK no focal spinal tenderness, no joint edema Neuro: non-focal, well-oriented, appropriate affect Breasts: Deferred   Lab Results  Component Value Date   WBC 8.3 09/18/2023   HGB 12.3 09/18/2023   HCT 36.6 09/18/2023   MCV 84.9 09/18/2023   PLT 255 09/18/2023   Lab Results  Component Value Date   FERRITIN 99 06/17/2021   IRON 48 06/17/2021   TIBC 311 06/17/2021   UIBC 263 06/17/2021   IRONPCTSAT 15 06/17/2021   Lab Results  Component Value Date   RBC 4.31 09/18/2023   No results found for: KPAFRELGTCHN, LAMBDASER, KAPLAMBRATIO No results found for: IGGSERUM, IGA, IGMSERUM No results found for: STEPHANY CARLOTA BENSON MARKEL EARLA JOANNIE DOC VICK, SPEI   Chemistry      Component Value Date/Time   NA 135 09/18/2023 1433   K 3.2 (L) 09/18/2023 1433   CL 100 09/18/2023 1433   CO2 22 09/18/2023 1433   BUN 26 (H) 09/18/2023 1433   CREATININE 0.77 09/18/2023 1433   CREATININE 0.84 11/15/2019 1516      Component Value Date/Time   CALCIUM  8.5 (L) 09/18/2023 1433   ALKPHOS 83 09/18/2023 1433   AST 23 09/18/2023 1433   ALT 17 09/18/2023 1433   BILITOT 0.4 09/18/2023 1433       Impression and Plan: [name] is a very charming 58 yo white female with  localized rectal cancer, stage IIA.  Recent CT scan of the abdomen done for work up for ventral hernia repair showed no evidence of malignancy.  CEA stable at 2.68.  We will plan to repeat an in MRI in the new year after the holidays.  Follow-up in 3 months.   Lauraine Pepper, NP 9/7/202512:20 PM

## 2023-09-25 ENCOUNTER — Other Ambulatory Visit

## 2023-09-25 ENCOUNTER — Ambulatory Visit: Admitting: Hematology & Oncology

## 2023-09-27 ENCOUNTER — Other Ambulatory Visit: Payer: Self-pay | Admitting: Family

## 2023-09-27 ENCOUNTER — Other Ambulatory Visit: Payer: Self-pay | Admitting: Neurology

## 2023-09-27 ENCOUNTER — Other Ambulatory Visit: Payer: Self-pay | Admitting: Family Medicine

## 2023-09-27 DIAGNOSIS — K219 Gastro-esophageal reflux disease without esophagitis: Secondary | ICD-10-CM

## 2023-09-27 DIAGNOSIS — E876 Hypokalemia: Secondary | ICD-10-CM

## 2023-09-28 ENCOUNTER — Ambulatory Visit: Payer: BC Managed Care – PPO | Admitting: Neurology

## 2023-09-28 ENCOUNTER — Other Ambulatory Visit (HOSPITAL_BASED_OUTPATIENT_CLINIC_OR_DEPARTMENT_OTHER): Payer: Self-pay

## 2023-09-28 ENCOUNTER — Other Ambulatory Visit: Payer: Self-pay

## 2023-09-28 ENCOUNTER — Encounter: Payer: Self-pay | Admitting: Neurology

## 2023-09-28 DIAGNOSIS — G932 Benign intracranial hypertension: Secondary | ICD-10-CM

## 2023-09-28 MED ORDER — TOPIRAMATE 100 MG PO TABS
100.0000 mg | ORAL_TABLET | Freq: Two times a day (BID) | ORAL | 4 refills | Status: AC
  Filled 2023-09-28 – 2023-11-09 (×2): qty 180, 90d supply, fill #0
  Filled 2024-02-03: qty 180, 90d supply, fill #1

## 2023-09-28 MED ORDER — OMEPRAZOLE 40 MG PO CPDR
40.0000 mg | DELAYED_RELEASE_CAPSULE | Freq: Every day | ORAL | 1 refills | Status: DC
  Filled 2023-09-28: qty 30, 30d supply, fill #0
  Filled 2023-10-29: qty 30, 30d supply, fill #1

## 2023-09-28 MED ORDER — NORTRIPTYLINE HCL 50 MG PO CAPS
100.0000 mg | ORAL_CAPSULE | Freq: Every evening | ORAL | 3 refills | Status: DC
  Filled 2023-09-28: qty 180, 90d supply, fill #0

## 2023-09-28 MED ORDER — NORTRIPTYLINE HCL 50 MG PO CAPS
100.0000 mg | ORAL_CAPSULE | Freq: Every evening | ORAL | 4 refills | Status: AC
  Filled 2023-09-28: qty 180, 90d supply, fill #0
  Filled 2024-01-04: qty 180, 90d supply, fill #1

## 2023-09-28 MED ORDER — POTASSIUM CHLORIDE CRYS ER 20 MEQ PO TBCR
20.0000 meq | EXTENDED_RELEASE_TABLET | Freq: Two times a day (BID) | ORAL | 6 refills | Status: AC
  Filled 2023-09-28: qty 60, 30d supply, fill #0
  Filled 2023-11-09: qty 60, 30d supply, fill #1
  Filled 2023-12-30: qty 60, 30d supply, fill #2

## 2023-09-28 NOTE — Telephone Encounter (Signed)
**Note De-identified  [place] Obfuscation** Please advise 

## 2023-09-28 NOTE — Progress Notes (Signed)
 NEUROLOGY FOLLOW UP OFFICE NOTE  Janice Lucas [id number] 02-20-1965  HISTORY OF PRESENT ILLNESS: I had the pleasure of seeing Janice Lucas in follow-up in the neurology clinic on 09/28/2023.  She is alone in the office today. The patient was last seen 8 months ago for migraines. She had pseudotumor cerebri however repeat eye exams have been normal, she has been off Acetazolamide  since 01/2023 with no worsening of headaches or vision changes. She sees her eye doctor in October. She reports migraines are overall stable on Topiramate  100mg  BID and Nortriptyline  100mg  at bedtime (50mg : 2 caps at bedtime), no side effects. She has had more stress recently with an increase in headaches to 2-3 a week. She reports stress from work (working 2 jobs), home (divorce), and surgery next week. She lives alone with her dog. She denies any loss of consciousness since 06/2021. No falls. She has been having pain in the right buttock radiating down her leg. Her last potassium was 3.2 on potassium supplements twice a day.   History on Initial Assessment 02/08/2016: This is a pleasant 58 yo RH woman with a history of pseudotumor cerebri diagnosed in 2015. Records from [facility] were reviewed, she presented to her eye doctor for a routine exam in April 2015 with a 78-month history of headaches thinking she may need new glasses. She was noted to have mild papilledema, right > left. At that time, headaches were behind her eyes and at the vertex, daily, worsened by bending, lifting, coughing, and sneezing. MRI and MRV brain were reported as normal. She had a lumbar puncture with opening pressure of 25 in the lateral position. They report problems with her LP, she had a post-spinal tap headache that initial blood patch did not help with. She was started on Topamax  and Diamox , and nortriptyline  was added for migraine prevention. She was doing very well on this regimen with rare headaches. Her neurologist Dr. Marlo left  the practice and she saw Dr. Leopoldo last October, with note of improvement in eye exam from last visit, no evidence of papilledema, and rare headaches. They discussed reducing some of her medications, Topamax  was reduced to 100mg  daily. With reduction in Topamax , she noticed an increase in headaches, particularly in the morning. She then lost her insurance and was unable to obtain the Diamox . Without this, headaches became much worse. She describes them as diffuse sharp pain, with occasional photo and phonophobia when severe, no nausea/vomiting. Bending over causes floaters in her vision. She has intermittent tinnitus (non-pulsatile). She feels her vision is getting worse, she is straining more to use the computer with blurry vision, no loss of vision. No driving difficulties with peripheral vision.    She has been back on the Diamox  after seeing her PCP, currently on uptitration taking 500mg  BID, then increasing back to original dose of 1000mg  BID. She had some paresthesias on the Diamox , and found that bananas help with this. She denies any dysarthria/dysphagia, neck/back pain, bowel/bladder dysfunction, dizziness, no falls. She reports some numbness in the three fingers of her right hand, they feel cold suddenly (like ice cubes) and hurt. 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   PAST MEDICAL HISTORY: Past Medical History:  Diagnosis Date   Allergy    SEASONAL - MILD   Diabetes mellitus without complication (HCC)    diet controlled/with meds   GERD (gastroesophageal reflux disease)    ON OMEPRAZOLE    Goals of care, counseling/discussion 04/26/2021  Hyperlipidemia    ON MEDS   Hypertension    ON MEDS   Rectal cancer (HCC) 04/26/2021   Sleep apnea    WEARS CPAP    MEDICATIONS: Current Outpatient Medications on File Prior to Visit  Medication Sig Dispense Refill   acetaminophen  (TYLENOL ) 500 MG tablet Take 500 mg by mouth every 6 (six) hours as  needed.     Ascorbic Acid (VITAMIN C) 1000 MG tablet Take 1,000 mg by mouth daily.     atorvastatin  (LIPITOR) 40 MG tablet Take 1 tablet (40 mg total) by mouth daily. 90 tablet 3   Biotin 89999 MCG TABS Take by mouth daily.     Calcium  Carb-Cholecalciferol (CALCIUM  1000 + D PO) Take by mouth 2 (two) times daily.     cholecalciferol (VITAMIN D3) 25 MCG (1000 UT) tablet Take 1,000 Units by mouth daily.     fluticasone  (FLONASE ) 50 MCG/ACT nasal spray Place 2 sprays into both nostrils daily as needed for allergies. 16 g 11   hydrochlorothiazide  (HYDRODIURIL ) 25 MG tablet Take 1 tablet (25 mg total) by mouth daily. 90 tablet 3   losartan  (COZAAR ) 50 MG tablet Take 1 tablet (50 mg total) by mouth daily. 90 tablet 3   magnesium 30 MG tablet Take 30 mg by mouth 2 (two) times daily.     metFORMIN  (GLUCOPHAGE ) 500 MG tablet Take 1 tablet (500 mg total) by mouth daily with breakfast. 90 tablet 3   nortriptyline  (PAMELOR ) 50 MG capsule Take 2 capsules (100 mg total) by mouth every night as directed 180 capsule 3   omeprazole  (PRILOSEC) 40 MG capsule Take 1 capsule (40 mg total) by mouth daily. 90 capsule 1   potassium chloride  SA (KLOR-CON  M) 20 MEQ tablet Take 1 tablet (20 mEq total) by mouth 2 (two) times daily. 60 tablet 6   topiramate  (TOPAMAX ) 100 MG tablet Take 1 tablet (100 mg total) by mouth 2 (two) times daily. 180 tablet 3   vitamin E 1000 UNIT capsule Take 1,000 Units by mouth daily.     cyclobenzaprine  (FLEXERIL ) 10 MG tablet Take 1 tablet (10 mg total) by mouth 2 (two) times daily as needed for muscle spasms. (Patient not taking: Reported on 09/28/2023) 30 tablet 0   [DISCONTINUED] prochlorperazine  (COMPAZINE ) 10 MG tablet Take 1 tablet (10 mg total) by mouth every 6 (six) hours as needed (Nausea or vomiting). (Patient not taking: Reported on 09/18/2023) 30 tablet 1   No current facility-administered medications on file prior to visit.    ALLERGIES: Allergies  Allergen Reactions    Sulfamethoxazole Other (See Comments)    Makes me loopy.   Neomycin-Bacitracin  Zn-Polymyx Rash   Sulfa Antibiotics Other (See Comments)    Acts goofy    FAMILY HISTORY: Family History  Problem Relation Age of Onset   Colon polyps Father    Prostate cancer Father        METS TO LYMPH NODES AND BLADDER   Diabetes Maternal Aunt    Diabetes Maternal Uncle    Colon cancer Neg Hx    Esophageal cancer Neg Hx    Rectal cancer Neg Hx    Stomach cancer Neg Hx     SOCIAL HISTORY: Social History   Socioeconomic History   Marital status: Married    Spouse name: Not on file   Number of children: Not on file   Years of education: Not on file   Highest education level: Some college, no degree  Occupational History   Not  on file  Tobacco Use   Smoking status: Former    Current packs/day: 1.00    Average packs/day: 1 pack/day for 20.0 years (20.0 ttl pk-yrs)    Types: Cigarettes   Smokeless tobacco: Never   Tobacco comments:    VAPES   Vaping Use   Vaping status: Every Day   Substances: Nicotine, Flavoring  Substance and Sexual Activity   Alcohol  use: No    Comment: occassionally   Drug use: Not Currently   Sexual activity: Not Currently  Other Topics Concern   Not on file  Social History Narrative   Right Handed   Lives in a one story home   Drinks a big cup of coffee daily   Social Drivers of Health   Financial Resource Strain: Low Risk  (01/12/2023)   Overall Financial Resource Strain (CARDIA)    Difficulty of Paying Living Expenses: Not hard at all  Food Insecurity: No Food Insecurity (01/12/2023)   Hunger Vital Sign    Worried About Running Out of Food in the Last Year: Never true    Ran Out of Food in the Last Year: Never true  Transportation Needs: No Transportation Needs (01/12/2023)   PRAPARE - Administrator, Civil Service (Medical): No    Lack of Transportation (Non-Medical): No  Physical Activity: Unknown (01/12/2023)   Exercise Vital Sign     Days of Exercise per Week: 0 days    Minutes of Exercise per Session: Not on file  Stress: No Stress Concern Present (01/12/2023)   [employer] of Occupational Health - Occupational Stress Questionnaire    Feeling of Stress : Not at all  Social Connections: Socially Isolated (01/12/2023)   Social Connection and Isolation Panel    Frequency of Communication with Friends and Family: More than three times a week    Frequency of Social Gatherings with Friends and Family: More than three times a week    Attends Religious Services: Never    Database administrator or Organizations: No    Attends Engineer, structural: Not on file    Marital Status: Separated  Intimate Partner Violence: Unknown (04/17/2021)   Received from [facility]   HITS    Physically Hurt: Not on file    Insult or Talk Down To: Not on file    Threaten Physical Harm: Not on file    Scream or Curse: Not on file     PHYSICAL EXAM: Vitals:   09/28/23 1429  BP: 117/83  Pulse: 80  SpO2: 98%   General: No acute distress Head:  Normocephalic/atraumatic Skin/Extremities: No rash, no edema Neurological Exam: alert and awake. No aphasia or dysarthria. Fund of knowledge is appropriate.  Attention and concentration are normal.   Cranial nerves: Pupils equal, round. Extraocular movements intact with no nystagmus. Visual fields full.  No facial asymmetry.  Motor: Bulk and tone normal, muscle strength 5/5 throughout with no pronator drift.   Finger to nose testing intact.  Gait slow and cautious reporting sciatica, no ataxia. No tremors.    IMPRESSION: This is a pleasant 58 yo RH woman with a history of migraines and pseudotumor cerebri, no worsening of headaches or vision changes off Diamox . She is scheduled for eye exam in October. She is satisfied with migraine frequency on Topiramate  100mg  BID and Nortriptyline  100mg  at bedtime, refills sent. No further episodes of loss of consciousness since 06/2021. Follow-up  in 1 year, call for any changes.   Thank you for  allowing me to participate in her care.  Please do not hesitate to call for any questions or concerns.    Darice Shivers, M.D.   CC: Dr. Watt

## 2023-09-28 NOTE — Patient Instructions (Signed)
 Good to see you, wishing you all the best! Continue Topiramate  100mg  twice a day and Nortriptyline  100mg  every night. Follow-up in 1 year, call for any changes.

## 2023-09-29 ENCOUNTER — Other Ambulatory Visit (HOSPITAL_BASED_OUTPATIENT_CLINIC_OR_DEPARTMENT_OTHER): Payer: Self-pay

## 2023-09-30 NOTE — Progress Notes (Signed)
 Surgical Instructions   Your procedure is scheduled on Tuesday October 06, 2023. Report to [facility] Main Entrance A at 5:30 A.M., then check in with the Admitting office. Any questions or running late day of surgery: call [phone]  Questions prior to your surgery date: call [phone], Monday-Friday, 8am-4pm. If you experience any cold or flu symptoms such as cough, fever, chills, shortness of breath, etc. between now and your scheduled surgery, please notify us  at the above number.     Remember:  Do not eat after midnight the night before your surgery   You may drink clear liquids until 4:30 the morning of your surgery.   Clear liquids allowed are: Water, Non-Citrus Juices (without pulp), Carbonated Beverages, Clear Tea (no milk, honey, etc.), Black Coffee Only (NO MILK, CREAM OR POWDERED CREAMER of any kind), and Gatorade.   Patient Instructions  The night before surgery:  No food after midnight. ONLY clear liquids after midnight    The day of surgery (if you have diabetes): Drink ONE (1) 12 oz G2 given to you in your pre admission testing appointment by 4:30 the morning of surgery. Drink in one sitting. Do not sip.  This drink was given to you during your hospital  pre-op appointment visit.  Nothing else to drink after completing the  12 oz bottle of G2.         If you have questions, please contact your surgeon's office.  Take these medicines the morning of surgery with A SIP OF WATER  atorvastatin  (LIPITOR)  omeprazole  (PRILOSEC)   May take these medicines IF NEEDED: acetaminophen  (TYLENOL )  fluticasone  (FLONASE )   One week prior to surgery, STOP taking any Aspirin (unless otherwise instructed by your surgeon) Aleve, Naproxen, Ibuprofen, Motrin, Advil, Goody's, BC's, all herbal medications, fish oil, and non-prescription vitamins.  WHAT DO I DO ABOUT MY DIABETES MEDICATION?   Do not take oral diabetes medicines metFORMIN  (GLUCOPHAGE ) (pills) the morning  of surgery.  .  The day of surgery, do not take other diabetes injectables, including Byetta (exenatide), Bydureon (exenatide ER), Victoza (liraglutide), or Trulicity (dulaglutide).  If your CBG is greater than 220 mg/dL, you may take  of your sliding scale (correction) dose of insulin.   HOW TO MANAGE YOUR DIABETES BEFORE AND AFTER SURGERY  Why is it important to control my blood sugar before and after surgery? Improving blood sugar levels before and after surgery helps healing and can limit problems. A way of improving blood sugar control is eating a healthy diet by:  Eating less sugar and carbohydrates  Increasing activity/exercise  Talking with your doctor about reaching your blood sugar goals High blood sugars (greater than 180 mg/dL) can raise your risk of infections and slow your recovery, so you will need to focus on controlling your diabetes during the weeks before surgery. Make sure that the doctor who takes care of your diabetes knows about your planned surgery including the date and location.  How do I manage my blood sugar before surgery? Check your blood sugar at least 4 times a day, starting 2 days before surgery, to make sure that the level is not too high or low.  Check your blood sugar the morning of your surgery when you wake up and every 2 hours until you get to the Short Stay unit.  If your blood sugar is less than 70 mg/dL, you will need to treat for low blood sugar: Do not take insulin. Treat a low blood sugar (less than 70  mg/dL) with  cup of clear juice (cranberry or apple), 4 glucose tablets, OR glucose gel. Recheck blood sugar in 15 minutes after treatment (to make sure it is greater than 70 mg/dL). If your blood sugar is not greater than 70 mg/dL on recheck, call [phone] for further instructions. Report your blood sugar to the short stay nurse when you get to Short Stay.  If you are admitted to the hospital after surgery: Your blood sugar will be  checked by the staff and you will probably be given insulin after surgery (instead of oral diabetes medicines) to make sure you have good blood sugar levels. The goal for blood sugar control after surgery is 80-180 mg/dL.                    Do NOT Smoke (Tobacco/Vaping) for 24 hours prior to your procedure.  If you use a CPAP at night, you may bring your mask/headgear for your overnight stay.   You will be asked to remove any contacts, glasses, piercing's, hearing aid's, dentures/partials prior to surgery. Please bring cases for these items if needed.    Patients discharged the day of surgery will not be allowed to drive home, and someone needs to stay with them for 24 hours.  SURGICAL WAITING ROOM VISITATION Patients may have no more than 2 support people in the waiting area - these visitors may rotate.   Pre-op nurse will coordinate an appropriate time for 1 ADULT support person, who may not rotate, to accompany patient in pre-op.  Children under the age of 62 must have an adult with them who is not the patient and must remain in the main waiting area with an adult.  If the patient needs to stay at the hospital during part of their recovery, the visitor guidelines for inpatient rooms apply.  Please refer to the [facility] website for the visitor guidelines for any additional information.   If you received a COVID test during your pre-op visit  it is requested that you wear a mask when out in public, stay away from anyone that may not be feeling well and notify your surgeon if you develop symptoms. If you have been in contact with anyone that has tested positive in the last 10 days please notify you surgeon.      Pre-operative CHG Bathing Instructions   You can play a key role in reducing the risk of infection after surgery. Your skin needs to be as free of germs as possible. You can reduce the number of germs on your skin by washing with CHG (chlorhexidine gluconate) soap before  surgery. CHG is an antiseptic soap that kills germs and continues to kill germs even after washing.   DO NOT use if you have an allergy to chlorhexidine/CHG or antibacterial soaps. If your skin becomes reddened or irritated, stop using the CHG and notify one of our RNs at [phone].              TAKE A SHOWER THE NIGHT BEFORE SURGERY AND THE DAY OF SURGERY    Please keep in mind the following:  DO NOT shave, including legs and underarms, 48 hours prior to surgery.   You may shave your face before/day of surgery.  Place clean sheets on your bed the night before surgery Use a clean washcloth (not used since being washed) for each shower. DO NOT sleep with pet's night before surgery.  CHG Shower Instructions:  Oncologist and private area  with normal soap. If you choose to wash your hair, wash first with your normal shampoo.  After you use shampoo/soap, rinse your hair and body thoroughly to remove shampoo/soap residue.  Turn the water OFF and apply half the bottle of CHG soap to a CLEAN washcloth.  Apply CHG soap ONLY FROM YOUR NECK DOWN TO YOUR TOES (washing for 3-5 minutes)  DO NOT use CHG soap on face, private areas, open wounds, or sores.  Pay special attention to the area where your surgery is being performed.  If you are having back surgery, having someone wash your back for you may be helpful. Wait 2 minutes after CHG soap is applied, then you may rinse off the CHG soap.  Pat dry with a clean towel  Put on clean pajamas    Additional instructions for the day of surgery: DO NOT APPLY any lotions, deodorants, cologne, or perfumes.   Do not wear jewelry or makeup Do not wear nail polish, gel polish, artificial nails, or any other type of covering on natural nails (fingers and toes) Do not bring valuables to the hospital. [facility] is not responsible for valuables/personal belongings. Put on clean/comfortable clothes.  Please brush your teeth.  Ask your nurse before applying  any prescription medications to the skin.

## 2023-10-01 ENCOUNTER — Other Ambulatory Visit: Payer: Self-pay

## 2023-10-01 ENCOUNTER — Encounter (HOSPITAL_COMMUNITY): Payer: Self-pay

## 2023-10-01 ENCOUNTER — Encounter (HOSPITAL_COMMUNITY)
Admission: RE | Admit: 2023-10-01 | Discharge: 2023-10-01 | Disposition: A | Discharge status: Home / Self Care | Source: Ambulatory Visit | Attending: General Surgery | Admitting: General Surgery

## 2023-10-01 DIAGNOSIS — Z87891 Personal history of nicotine dependence: Secondary | ICD-10-CM | POA: Insufficient documentation

## 2023-10-01 DIAGNOSIS — Z981 Arthrodesis status: Secondary | ICD-10-CM | POA: Diagnosis not present

## 2023-10-01 DIAGNOSIS — E119 Type 2 diabetes mellitus without complications: Secondary | ICD-10-CM | POA: Diagnosis not present

## 2023-10-01 DIAGNOSIS — I10 Essential (primary) hypertension: Secondary | ICD-10-CM | POA: Diagnosis not present

## 2023-10-01 DIAGNOSIS — K432 Incisional hernia without obstruction or gangrene: Secondary | ICD-10-CM | POA: Diagnosis not present

## 2023-10-01 DIAGNOSIS — E785 Hyperlipidemia, unspecified: Secondary | ICD-10-CM | POA: Insufficient documentation

## 2023-10-01 DIAGNOSIS — Z85048 Personal history of other malignant neoplasm of rectum, rectosigmoid junction, and anus: Secondary | ICD-10-CM | POA: Insufficient documentation

## 2023-10-01 DIAGNOSIS — Z86718 Personal history of other venous thrombosis and embolism: Secondary | ICD-10-CM | POA: Diagnosis not present

## 2023-10-01 DIAGNOSIS — K219 Gastro-esophageal reflux disease without esophagitis: Secondary | ICD-10-CM | POA: Diagnosis not present

## 2023-10-01 DIAGNOSIS — Z9049 Acquired absence of other specified parts of digestive tract: Secondary | ICD-10-CM | POA: Diagnosis not present

## 2023-10-01 DIAGNOSIS — Z01818 Encounter for other preprocedural examination: Secondary | ICD-10-CM | POA: Diagnosis not present

## 2023-10-01 DIAGNOSIS — Z7984 Long term (current) use of oral hypoglycemic drugs: Secondary | ICD-10-CM | POA: Insufficient documentation

## 2023-10-01 DIAGNOSIS — G4733 Obstructive sleep apnea (adult) (pediatric): Secondary | ICD-10-CM | POA: Insufficient documentation

## 2023-10-01 LAB — GLUCOSE, CAPILLARY: Glucose-Capillary: 81 mg/dL (ref 70–99)

## 2023-10-01 NOTE — Progress Notes (Addendum)
 PCP - Dr. Harlene Copland Cardiologist - Denies Oncologist - Dr. Lauraine Pepper  PPM/ICD - Denies Device Orders - n/a Rep Notified - n/a  Chest x-ray - 01-22-23 EKG - 01-22-23 Stress Test - Denies ECHO - Denies Cardiac Cath - Denies Lab work done via Cancer center in epic on 09-18-23  Sleep Study - Yes CPAP - wears cpap nightly  Diabetes -  patient does not check her blood sugars as she only takes metformin . Per patient A1C was slightly high so the physician is trying to lower with metformin .  Per patient she has never checked her blood sugars. Last A1C was 6.5 on 08-20-23  Last dose of GLP1 agonist-  Denies GLP1 instructions: n/a  Blood Thinner Instructions: Denies Aspirin Instructions: Denies  ERAS Protcol - Clears until 043 PRE-SURGERY Ensure or G2- G2   COVID TEST- n/a   Anesthesia review: DM, HTN,   Patient denies shortness of breath, fever, cough and chest pain at PAT appointment. Patient denies any respiratory issues at this time.    All instructions explained to the patient, with a verbal understanding of the material. Patient agrees to go over the instructions while at home for a better understanding. Patient also instructed to self quarantine after being tested for COVID-19. The opportunity to ask questions was provided.

## 2023-10-02 ENCOUNTER — Other Ambulatory Visit (HOSPITAL_BASED_OUTPATIENT_CLINIC_OR_DEPARTMENT_OTHER): Payer: Self-pay

## 2023-10-02 NOTE — Anesthesia Preprocedure Evaluation (Addendum)
 Anesthesia Evaluation  Patient identified by MRN, date of birth, ID band Patient awake    Reviewed: Allergy & Precautions, H&P , NPO status , Patient's Chart, lab work & pertinent test results, reviewed documented beta blocker date and time   Airway Mallampati: II   Neck ROM: Limited    Dental  (+) Dental Advisory Given   Pulmonary sleep apnea , former smoker   breath sounds clear to auscultation       Cardiovascular hypertension, Pt. on medications + Peripheral Vascular Disease   Rhythm:Regular Rate:Normal     Neuro/Psych  Headaches   Depression       GI/Hepatic ,GERD  ,,  Endo/Other  diabetes, Type 2  Class 3 obesity  Renal/GU      Musculoskeletal   Abdominal  (+) + obese Abdomen: soft. Bowel sounds: normal.  Peds  Hematology   Anesthesia Other Findings   Reproductive/Obstetrics                              Anesthesia Physical Anesthesia Plan  ASA: 3  Anesthesia Plan: General   Post-op Pain Management: Tylenol  PO (pre-op)*   Induction: Intravenous  PONV Risk Score and Plan: 3 and Ondansetron , Dexamethasone , Midazolam  and Treatment may vary due to age or medical condition  Airway Management Planned: Oral ETT  Additional Equipment:   Intra-op Plan:   Post-operative Plan: Extubation in OR  Informed Consent: I have reviewed the patients History and Physical, chart, labs and discussed the procedure including the risks, benefits and alternatives for the proposed anesthesia with the patient or authorized representative who has indicated his/her understanding and acceptance.     Dental advisory given  Plan Discussed with: CRNA and Surgeon  Anesthesia Plan Comments: (PAT note written 10/02/2023 by Allison Zelenak, PA-C.  )         Anesthesia Quick Evaluation

## 2023-10-02 NOTE — Progress Notes (Signed)
 Anesthesia Chart Review:  Case: [id number] Date/Time: 10/06/23 0715   Procedure: REPAIR, HERNIA, VENTRAL, ROBOT-ASSISTED - ROBOTIC INCISIONAL HERNIA REPAIR WITH MESH ETEP   Anesthesia type: General   Diagnosis: Incisional hernia, without obstruction or gangrene [K43.2]   Pre-op diagnosis: INCISIONAL HERNIA   [facility] / [facility]   Surgeons: Rubin Calamity, MD       DISCUSSION: Patient is a 58 year old female scheduled for the above procedure.  History includes former smoker, HTN, OSA (uses CPAP), GERD, HLD, rectal cancer (s/p chemotherapy; s/p laparoscopic lower anterior resection, loop ileostomy 09/18/2021; ostomy takedown 12/16/2021), LLE DVT (07/10/2021), DM2, spinal surgery (C6-7 ACDF 02/14/2011), Port right internal jugular (05/02/2021), migraines.  Recent A1c 6.5%.   Anesthesia team to evaluate on the day of surgery.   VS: BP 106/79   Pulse 81   Temp 36.6 C   Resp 18   Ht 5' 6 (1.676 m)   Wt 113.5 kg   LMP 01/29/2011   SpO2 99%   BMI 40.38 kg/m   PROVIDERS: Copland, Harlene BROCKS, MD PCP Timmy Coy, MD is HEM-[facility] Georjean Pao, MD is neurologist  LABS: Most recent labs results in [facility] include: Lab Results  Component Value Date   WBC 8.3 09/18/2023   HGB 12.3 09/18/2023   HCT 36.6 09/18/2023   PLT 255 09/18/2023   GLUCOSE 120 (H) 09/18/2023   CHOL 196 08/20/2023   TRIG 97.0 08/20/2023   HDL 54.30 08/20/2023   LDLCALC 122 (H) 08/20/2023   ALT 17 09/18/2023   AST 23 09/18/2023   NA 135 09/18/2023   K 3.2 (L) 09/18/2023   CL 100 09/18/2023   CREATININE 0.77 09/18/2023   BUN 26 (H) 09/18/2023   CO2 22 09/18/2023   TSH 1.29 02/11/2021   HGBA1C 6.5 08/20/2023    IMAGES: CT Abd/pelvis 07/22/2023: IMPRESSION: 1. Status post low anterior resection and reanastomosis. 2. Large parastomal hernia in the right hemiabdomen containing nonobstructed transverse colon and multiple loops of mid small bowel, hernia sac measuring 13.1 x 8.3 cm, hernia neck  measuring 4.6 cm. 3. Large burden of stool throughout the colon. 4. No evidence of lymphadenopathy or metastatic disease in the abdomen or pelvis. Aortic Atherosclerosis (ICD10-I70.0).  MRI Pelvis 06/17/2023: IMPRESSION: 1. No evidence of residual or recurrent rectal carcinoma. 2. No evidence of pelvic lymphadenopathy.  CXR 01/22/2023: FINDINGS: Right-sided Port-A-Cath with tip over central SVC. No focal consolidation, pleural effusion, or pneumothorax. The cardiac silhouette is within normal limits. No acute osseous pathology. Lower cervical ACDF. IMPRESSION: No active cardiopulmonary disease.    EKG: 01/22/2023: Sinus Rhythm at 82 bpm Low voltage in precordial leads. ABNORMAL   CV: N/A  Past Medical History:  Diagnosis Date   Allergy    SEASONAL - MILD   Diabetes mellitus without complication (HCC)    diet controlled/with meds   GERD (gastroesophageal reflux disease)    ON OMEPRAZOLE    Goals of care, counseling/discussion 04/26/2021   Hyperlipidemia    ON MEDS   Hypertension    ON MEDS   Rectal cancer (HCC) 04/26/2021   Sleep apnea    WEARS CPAP    Past Surgical History:  Procedure Laterality Date   ANTERIOR CERVICAL DECOMP/DISCECTOMY FUSION  02/14/2011   Procedure: ANTERIOR CERVICAL DECOMPRESSION/DISCECTOMY FUSION 1 LEVEL;  Surgeon: Victory JINNY Gens, MD;  Location: [facility];  Service: Neurosurgery;  Laterality: N/A;  Anterior Cervical Six-Seven Decompression and Fusion   COLON SURGERY  09/2021   COLON SURGERY  12/2021  IR IMAGING GUIDED PORT INSERTION  05/02/2021    MEDICATIONS:  acetaminophen  (TYLENOL ) 500 MG tablet   Ascorbic Acid (VITAMIN C) 1000 MG tablet   atorvastatin  (LIPITOR) 40 MG tablet   Biotin 89999 MCG TABS   Calcium  Carb-Cholecalciferol (CALCIUM  1000 + D PO)   cholecalciferol (VITAMIN D3) 25 MCG (1000 UT) tablet   cyclobenzaprine  (FLEXERIL ) 10 MG tablet   fluticasone  (FLONASE ) 50 MCG/ACT nasal spray   hydrochlorothiazide  (HYDRODIURIL ) 25  MG tablet   losartan  (COZAAR ) 50 MG tablet   magnesium 30 MG tablet   metFORMIN  (GLUCOPHAGE ) 500 MG tablet   nortriptyline  (PAMELOR ) 50 MG capsule   omeprazole  (PRILOSEC) 40 MG capsule   potassium chloride  SA (KLOR-CON  M) 20 MEQ tablet   topiramate  (TOPAMAX ) 100 MG tablet   vitamin E 1000 UNIT capsule   No current facility-administered medications for this encounter.    Isaiah Ruder, PA-C Surgical Short Stay/Anesthesiology [facility] Phone [phone] [facility] Phone [phone] 10/02/2023 6:41 PM

## 2023-10-05 ENCOUNTER — Other Ambulatory Visit (HOSPITAL_BASED_OUTPATIENT_CLINIC_OR_DEPARTMENT_OTHER): Payer: Self-pay

## 2023-10-06 ENCOUNTER — Ambulatory Visit (HOSPITAL_COMMUNITY)

## 2023-10-06 ENCOUNTER — Encounter (HOSPITAL_COMMUNITY)
Admission: RE | Disposition: A | Discharge status: Home / Self Care | Payer: Self-pay | Source: Home / Self Care | Attending: General Surgery

## 2023-10-06 ENCOUNTER — Observation Stay (HOSPITAL_COMMUNITY)
Admission: RE | Admit: 2023-10-06 | Discharge: 2023-10-07 | Disposition: A | Discharge status: Home / Self Care | Attending: General Surgery | Admitting: General Surgery

## 2023-10-06 ENCOUNTER — Other Ambulatory Visit: Payer: Self-pay

## 2023-10-06 ENCOUNTER — Ambulatory Visit (HOSPITAL_COMMUNITY): Payer: Self-pay | Admitting: Vascular Surgery

## 2023-10-06 ENCOUNTER — Encounter (HOSPITAL_COMMUNITY): Payer: Self-pay | Admitting: General Surgery

## 2023-10-06 DIAGNOSIS — F17210 Nicotine dependence, cigarettes, uncomplicated: Secondary | ICD-10-CM | POA: Insufficient documentation

## 2023-10-06 DIAGNOSIS — I10 Essential (primary) hypertension: Secondary | ICD-10-CM | POA: Diagnosis not present

## 2023-10-06 DIAGNOSIS — F1090 Alcohol use, unspecified, uncomplicated: Secondary | ICD-10-CM | POA: Diagnosis not present

## 2023-10-06 DIAGNOSIS — K432 Incisional hernia without obstruction or gangrene: Principal | ICD-10-CM | POA: Insufficient documentation

## 2023-10-06 DIAGNOSIS — Z23 Encounter for immunization: Secondary | ICD-10-CM | POA: Insufficient documentation

## 2023-10-06 DIAGNOSIS — Z8719 Personal history of other diseases of the digestive system: Principal | ICD-10-CM

## 2023-10-06 DIAGNOSIS — Z79899 Other long term (current) drug therapy: Secondary | ICD-10-CM | POA: Insufficient documentation

## 2023-10-06 DIAGNOSIS — E119 Type 2 diabetes mellitus without complications: Secondary | ICD-10-CM | POA: Diagnosis not present

## 2023-10-06 HISTORY — PX: XI ROBOTIC ASSISTED VENTRAL HERNIA: SHX6789

## 2023-10-06 LAB — GLUCOSE, CAPILLARY
Glucose-Capillary: 96 mg/dL (ref 70–99)
Glucose-Capillary: 152 mg/dL — ABNORMAL HIGH (ref 70–99)

## 2023-10-06 SURGERY — REPAIR, HERNIA, VENTRAL, ROBOT-ASSISTED
Anesthesia: General

## 2023-10-06 MED ORDER — MEPERIDINE HCL 25 MG/ML IJ SOLN
6.2500 mg | INTRAMUSCULAR | Status: DC | PRN
  Filled 2023-10-06: qty 1

## 2023-10-06 MED ORDER — CHLORHEXIDINE GLUCONATE CLOTH 2 % EX PADS: 6.0000 | MEDICATED_PAD | Freq: Once | CUTANEOUS | Status: DC

## 2023-10-06 MED ORDER — ONDANSETRON HCL 4 MG/2ML IJ SOLN
INTRAMUSCULAR | Status: DC | PRN
  Administered 2023-10-06: 4 mg via INTRAVENOUS

## 2023-10-06 MED ORDER — AMISULPRIDE (ANTIEMETIC) 5 MG/2ML IV SOLN: 10.0000 mg | Freq: Once | INTRAVENOUS | Status: DC | PRN

## 2023-10-06 MED ORDER — PROPOFOL 10 MG/ML IV BOLUS
INTRAVENOUS | Status: DC | PRN
Start: 2023-10-06 — End: 2023-10-06
  Administered 2023-10-06: 150 mg via INTRAVENOUS

## 2023-10-06 MED ORDER — ACETAMINOPHEN 500 MG PO TABS
ORAL_TABLET | ORAL | Status: AC
  Administered 2023-10-06: 1000 mg via ORAL
  Filled 2023-10-06: qty 2

## 2023-10-06 MED ORDER — BUPIVACAINE HCL 0.25 % IJ SOLN
INTRAMUSCULAR | Status: DC | PRN
  Administered 2023-10-06: 18 mL

## 2023-10-06 MED ORDER — CEFAZOLIN SODIUM-DEXTROSE 2-4 GM/100ML-% IV SOLN
2.0000 g | INTRAVENOUS | Status: AC
Start: 2023-10-06 — End: 2023-10-06
  Administered 2023-10-06: 2 g via INTRAVENOUS

## 2023-10-06 MED ORDER — CHLORHEXIDINE GLUCONATE 0.12 % MT SOLN: 15.0000 mL | Freq: Once | OROMUCOSAL | Status: AC

## 2023-10-06 MED ORDER — GABAPENTIN 300 MG PO CAPS
300.0000 mg | ORAL_CAPSULE | Freq: Two times a day (BID) | ORAL | Status: DC
  Administered 2023-10-06 – 2023-10-07 (×3): 300 mg via ORAL
  Filled 2023-10-06 (×3): qty 1

## 2023-10-06 MED ORDER — ROCURONIUM BROMIDE 10 MG/ML (PF) SYRINGE
PREFILLED_SYRINGE | INTRAVENOUS | Status: AC
  Filled 2023-10-06: qty 10

## 2023-10-06 MED ORDER — DEXAMETHASONE SODIUM PHOSPHATE 10 MG/ML IJ SOLN
INTRAMUSCULAR | Status: AC
  Filled 2023-10-06: qty 1

## 2023-10-06 MED ORDER — MIDAZOLAM HCL 2 MG/2ML IJ SOLN
INTRAMUSCULAR | Status: AC
  Filled 2023-10-06: qty 2

## 2023-10-06 MED ORDER — SODIUM CHLORIDE 0.9 % IV SOLN: INTRAVENOUS | Status: DC

## 2023-10-06 MED ORDER — VISTASEAL 10 ML SINGLE DOSE KIT
10.0000 mL | PACK | Freq: Once | CUTANEOUS | Status: DC
  Filled 2023-10-06: qty 10

## 2023-10-06 MED ORDER — OXYCODONE HCL 5 MG PO TABS: 5.0000 mg | ORAL_TABLET | ORAL | Status: DC | PRN

## 2023-10-06 MED ORDER — PROPOFOL 10 MG/ML IV BOLUS
INTRAVENOUS | Status: AC
Start: 2023-10-06 — End: 2023-10-06
  Filled 2023-10-06: qty 20

## 2023-10-06 MED ORDER — OXYCODONE HCL 5 MG/5ML PO SOLN: 5.0000 mg | Freq: Once | ORAL | Status: DC | PRN

## 2023-10-06 MED ORDER — GLYCOPYRROLATE 0.2 MG/ML IJ SOLN
INTRAMUSCULAR | Status: DC | PRN
  Administered 2023-10-06: .2 mg via INTRAVENOUS

## 2023-10-06 MED ORDER — CEFAZOLIN SODIUM-DEXTROSE 2-4 GM/100ML-% IV SOLN
INTRAVENOUS | Status: AC
  Filled 2023-10-06: qty 100

## 2023-10-06 MED ORDER — HYDROMORPHONE HCL 1 MG/ML IJ SOLN: 1.0000 mg | INTRAMUSCULAR | Status: DC | PRN

## 2023-10-06 MED ORDER — LACTATED RINGERS IV SOLN
INTRAVENOUS | Status: DC
Start: 2023-10-06 — End: 2023-10-06

## 2023-10-06 MED ORDER — LACTATED RINGERS IV SOLN: INTRAVENOUS | Status: DC | PRN

## 2023-10-06 MED ORDER — ACETAMINOPHEN 500 MG PO TABS
1000.0000 mg | ORAL_TABLET | Freq: Four times a day (QID) | ORAL | Status: DC
Start: 2023-10-06 — End: 2023-10-07
  Administered 2023-10-06 – 2023-10-07 (×3): 1000 mg via ORAL
  Filled 2023-10-06 (×3): qty 2

## 2023-10-06 MED ORDER — METFORMIN HCL 500 MG PO TABS
500.0000 mg | ORAL_TABLET | Freq: Every day | ORAL | Status: DC
  Administered 2023-10-07: 500 mg via ORAL
  Filled 2023-10-06: qty 1

## 2023-10-06 MED ORDER — CHLORHEXIDINE GLUCONATE 0.12 % MT SOLN
OROMUCOSAL | Status: AC
  Administered 2023-10-06: 15 mL via OROMUCOSAL
  Filled 2023-10-06: qty 15

## 2023-10-06 MED ORDER — VISTASEAL 10 ML SINGLE DOSE KIT
PACK | CUTANEOUS | Status: DC | PRN
  Administered 2023-10-06: 10 mL via TOPICAL

## 2023-10-06 MED ORDER — FENTANYL CITRATE (PF) 250 MCG/5ML IJ SOLN
INTRAMUSCULAR | Status: DC | PRN
  Administered 2023-10-06: 100 ug via INTRAVENOUS

## 2023-10-06 MED ORDER — ONDANSETRON HCL 4 MG/2ML IJ SOLN: 4.0000 mg | Freq: Four times a day (QID) | INTRAMUSCULAR | Status: DC | PRN

## 2023-10-06 MED ORDER — LOSARTAN POTASSIUM 50 MG PO TABS
50.0000 mg | ORAL_TABLET | Freq: Every day | ORAL | Status: DC
  Administered 2023-10-06 – 2023-10-07 (×2): 50 mg via ORAL
  Filled 2023-10-06 (×2): qty 1

## 2023-10-06 MED ORDER — NORTRIPTYLINE HCL 25 MG PO CAPS
100.0000 mg | ORAL_CAPSULE | Freq: Every evening | ORAL | Status: DC
  Administered 2023-10-06: 100 mg via ORAL
  Filled 2023-10-06: qty 4

## 2023-10-06 MED ORDER — PNEUMOCOCCAL 20-VAL CONJ VACC 0.5 ML IM SUSY
0.5000 mL | PREFILLED_SYRINGE | INTRAMUSCULAR | Status: AC
  Administered 2023-10-07: 0.5 mL via INTRAMUSCULAR
  Filled 2023-10-06: qty 0.5

## 2023-10-06 MED ORDER — PHENYLEPHRINE 80 MCG/ML (10ML) SYRINGE FOR IV PUSH (FOR BLOOD PRESSURE SUPPORT)
PREFILLED_SYRINGE | INTRAVENOUS | Status: DC | PRN
  Administered 2023-10-06: 160 ug via INTRAVENOUS
  Administered 2023-10-06: 120 ug via INTRAVENOUS
  Administered 2023-10-06: 80 ug via INTRAVENOUS
  Administered 2023-10-06: 160 ug via INTRAVENOUS
  Administered 2023-10-06: 40 ug via INTRAVENOUS

## 2023-10-06 MED ORDER — PHENYLEPHRINE HCL-NACL 20-0.9 MG/250ML-% IV SOLN
INTRAVENOUS | Status: DC | PRN
  Administered 2023-10-06: 25 ug/min via INTRAVENOUS

## 2023-10-06 MED ORDER — POTASSIUM CHLORIDE CRYS ER 20 MEQ PO TBCR
20.0000 meq | EXTENDED_RELEASE_TABLET | Freq: Two times a day (BID) | ORAL | Status: DC
  Administered 2023-10-06 – 2023-10-07 (×3): 20 meq via ORAL
  Filled 2023-10-06 (×3): qty 1

## 2023-10-06 MED ORDER — SODIUM CHLORIDE 0.9 % IV SOLN
12.5000 mg | INTRAVENOUS | Status: DC | PRN
  Filled 2023-10-06: qty 0.5

## 2023-10-06 MED ORDER — TOPIRAMATE 25 MG PO TABS
100.0000 mg | ORAL_TABLET | Freq: Two times a day (BID) | ORAL | Status: DC
  Administered 2023-10-06 – 2023-10-07 (×3): 100 mg via ORAL
  Filled 2023-10-06 (×3): qty 4

## 2023-10-06 MED ORDER — PANTOPRAZOLE SODIUM 40 MG PO TBEC
40.0000 mg | DELAYED_RELEASE_TABLET | Freq: Every day | ORAL | Status: DC
  Administered 2023-10-07: 40 mg via ORAL
  Filled 2023-10-06 (×2): qty 1

## 2023-10-06 MED ORDER — ORAL CARE MOUTH RINSE: 15.0000 mL | Freq: Once | OROMUCOSAL | Status: AC

## 2023-10-06 MED ORDER — ROCURONIUM BROMIDE 10 MG/ML (PF) SYRINGE
PREFILLED_SYRINGE | INTRAVENOUS | Status: DC | PRN
  Administered 2023-10-06: 20 mg via INTRAVENOUS
  Administered 2023-10-06: 100 mg via INTRAVENOUS

## 2023-10-06 MED ORDER — LIDOCAINE 2% (20 MG/ML) 5 ML SYRINGE
INTRAMUSCULAR | Status: DC | PRN
  Administered 2023-10-06: 100 mg via INTRAVENOUS

## 2023-10-06 MED ORDER — GLYCOPYRROLATE PF 0.2 MG/ML IJ SOSY
PREFILLED_SYRINGE | INTRAMUSCULAR | Status: AC
  Filled 2023-10-06: qty 1

## 2023-10-06 MED ORDER — INSULIN ASPART 100 UNIT/ML IJ SOLN: 0.0000 [IU] | INTRAMUSCULAR | Status: DC | PRN

## 2023-10-06 MED ORDER — ONDANSETRON 4 MG PO TBDP: 4.0000 mg | ORAL_TABLET | Freq: Four times a day (QID) | ORAL | Status: DC | PRN

## 2023-10-06 MED ORDER — OXYCODONE HCL 5 MG PO TABS: 5.0000 mg | ORAL_TABLET | Freq: Once | ORAL | Status: DC | PRN

## 2023-10-06 MED ORDER — METHOCARBAMOL 500 MG PO TABS: 500.0000 mg | ORAL_TABLET | Freq: Four times a day (QID) | ORAL | Status: DC | PRN

## 2023-10-06 MED ORDER — DEXAMETHASONE SODIUM PHOSPHATE 10 MG/ML IJ SOLN
INTRAMUSCULAR | Status: DC | PRN
  Administered 2023-10-06: 5 mg via INTRAVENOUS

## 2023-10-06 MED ORDER — ONDANSETRON HCL 4 MG/2ML IJ SOLN
INTRAMUSCULAR | Status: AC
  Filled 2023-10-06: qty 2

## 2023-10-06 MED ORDER — INFLUENZA VIRUS VACC SPLIT PF (FLUZONE) 0.5 ML IM SUSY
0.5000 mL | PREFILLED_SYRINGE | INTRAMUSCULAR | Status: AC
Start: 2023-10-07 — End: 2023-10-07
  Administered 2023-10-07: 0.5 mL via INTRAMUSCULAR
  Filled 2023-10-06 (×2): qty 0.5

## 2023-10-06 MED ORDER — HYDROMORPHONE HCL 1 MG/ML IJ SOLN: 0.2500 mg | INTRAMUSCULAR | Status: DC | PRN

## 2023-10-06 MED ORDER — PROPOFOL 10 MG/ML IV BOLUS
INTRAVENOUS | Status: AC
  Filled 2023-10-06: qty 20

## 2023-10-06 MED ORDER — EPHEDRINE SULFATE-NACL 50-0.9 MG/10ML-% IV SOSY
PREFILLED_SYRINGE | INTRAVENOUS | Status: DC | PRN
  Administered 2023-10-06: 5 mg via INTRAVENOUS
  Administered 2023-10-06: 2.5 mg via INTRAVENOUS

## 2023-10-06 MED ORDER — FENTANYL CITRATE (PF) 250 MCG/5ML IJ SOLN
INTRAMUSCULAR | Status: AC
  Filled 2023-10-06: qty 5

## 2023-10-06 MED ORDER — SUGAMMADEX SODIUM 200 MG/2ML IV SOLN
INTRAVENOUS | Status: DC | PRN
  Administered 2023-10-06: 300 mg via INTRAVENOUS

## 2023-10-06 MED ORDER — MIDAZOLAM HCL 2 MG/2ML IJ SOLN
INTRAMUSCULAR | Status: DC | PRN
  Administered 2023-10-06: 2 mg via INTRAVENOUS

## 2023-10-06 MED ORDER — BUPIVACAINE-EPINEPHRINE (PF) 0.25% -1:200000 IJ SOLN
INTRAMUSCULAR | Status: AC
  Filled 2023-10-06: qty 30

## 2023-10-06 MED ORDER — LIDOCAINE 2% (20 MG/ML) 5 ML SYRINGE
INTRAMUSCULAR | Status: AC
  Filled 2023-10-06: qty 5

## 2023-10-06 MED ORDER — SIMETHICONE 80 MG PO CHEW: 40.0000 mg | CHEWABLE_TABLET | Freq: Four times a day (QID) | ORAL | Status: DC | PRN

## 2023-10-06 MED ORDER — HYDROCHLOROTHIAZIDE 25 MG PO TABS
25.0000 mg | ORAL_TABLET | Freq: Every day | ORAL | Status: DC
  Administered 2023-10-06 – 2023-10-07 (×2): 25 mg via ORAL
  Filled 2023-10-06 (×2): qty 1

## 2023-10-06 MED ORDER — ACETAMINOPHEN 500 MG PO TABS
1000.0000 mg | ORAL_TABLET | ORAL | Status: AC
Start: 2023-10-06 — End: 2023-10-06

## 2023-10-06 SURGICAL SUPPLY — 50 items
APPLICATOR VISTASEAL 35 (MISCELLANEOUS) IMPLANT
BAG COUNTER SPONGE SURGICOUNT (BAG) IMPLANT
BINDER ABDOMINAL 10 UNV 27-48 (MISCELLANEOUS) IMPLANT
BINDER ABDOMINAL 12 ML 46-62 (SOFTGOODS) IMPLANT
CATH URTH STD 16FR FL 2W DRN (CATHETERS) IMPLANT
CHLORAPREP W/TINT 26 (MISCELLANEOUS) ×1 IMPLANT
COVER MAYO STAND STRL (DRAPES) ×1 IMPLANT
COVER SURGICAL LIGHT HANDLE (MISCELLANEOUS) ×1 IMPLANT
COVER TIP SHEARS 8 DVNC (MISCELLANEOUS) ×1 IMPLANT
DEFOGGER SCOPE WARM SEASHARP (MISCELLANEOUS) ×1 IMPLANT
DERMABOND ADVANCED .7 DNX12 (GAUZE/BANDAGES/DRESSINGS) ×1 IMPLANT
DEVICE SECURE STRAP 25 ABSORB (INSTRUMENTS) IMPLANT
DEVICE TROCAR PUNCTURE CLOSURE (ENDOMECHANICALS) ×1 IMPLANT
DRAPE ARM DVNC X/XI (DISPOSABLE) ×4 IMPLANT
DRAPE COLUMN DVNC XI (DISPOSABLE) ×1 IMPLANT
DRAPE CV SPLIT W-CLR ANES SCRN (DRAPES) ×1 IMPLANT
DRAPE SURG ORHT 6 SPLT 77X108 (DRAPES) ×1 IMPLANT
DRIVER NDL MEGA SUTCUT DVNCXI (INSTRUMENTS) ×1 IMPLANT
DRIVER NDLE MEGA SUTCUT DVNCXI (INSTRUMENTS) ×1 IMPLANT
FORCEPS BPLR FENES DVNC XI (FORCEP) ×1 IMPLANT
FORCEPS PROGRASP DVNC XI (FORCEP) ×1 IMPLANT
GLOVE BIO SURGEON STRL SZ7.5 (GLOVE) ×2 IMPLANT
GLOVE SURG LATEX 7.5 PF (GLOVE) ×2 IMPLANT
GOWN STRL REUS W/ TWL LRG LVL3 (GOWN DISPOSABLE) ×2 IMPLANT
GOWN STRL REUS W/ TWL XL LVL3 (GOWN DISPOSABLE) ×2 IMPLANT
GOWN STRL REUS W/TWL 2XL LVL3 (GOWN DISPOSABLE) ×1 IMPLANT
IRRIGATION SUCT STRKRFLW 2 WTP (MISCELLANEOUS) IMPLANT
KIT BASIN OR (CUSTOM PROCEDURE TRAY) ×1 IMPLANT
KIT TURNOVER KIT B (KITS) ×1 IMPLANT
MARKER SKIN DUAL TIP RULER LAB (MISCELLANEOUS) ×1 IMPLANT
MESH SOFT 12X12IN BARD (Mesh General) IMPLANT
NDL HYPO 22X1.5 SAFETY MO (MISCELLANEOUS) ×1 IMPLANT
NEEDLE HYPO 22X1.5 SAFETY MO (MISCELLANEOUS) ×1 IMPLANT
PAD ARMBOARD POSITIONER FOAM (MISCELLANEOUS) ×2 IMPLANT
PENCIL SMOKE EVACUATOR (MISCELLANEOUS) IMPLANT
SCISSORS LAP 5X35 DISP (ENDOMECHANICALS) IMPLANT
SCISSORS MNPLR CVD DVNC XI (INSTRUMENTS) ×1 IMPLANT
SEAL UNIV 5-12 XI (MISCELLANEOUS) ×3 IMPLANT
SET TUBE SMOKE EVAC HIGH FLOW (TUBING) ×1 IMPLANT
SPIKE FLUID TRANSFER (MISCELLANEOUS) ×1 IMPLANT
STOPCOCK 4 WAY LG BORE MALE ST (IV SETS) ×1 IMPLANT
SUT MNCRL AB 4-0 PS2 18 (SUTURE) ×1 IMPLANT
SUT STRATA 2-0 15 CT-1.5 (SUTURE) ×1 IMPLANT
SUT STRATA PDS 0 15 CT-1.5 (SUTURE) IMPLANT
SUT STRATA PDS 2-0 23 CT-1 (SUTURE) IMPLANT
SUT STRATAFIX PDS+0 CT1 9 (SUTURE) IMPLANT
SUT STRATAFIX SPIRAL PDS+ 0 30 (SUTURE) IMPLANT
TOWEL GREEN STERILE FF (TOWEL DISPOSABLE) ×1 IMPLANT
TRAY LAPAROSCOPIC MC (CUSTOM PROCEDURE TRAY) ×1 IMPLANT
TROCAR XCEL NON-BLD 5MMX100MML (ENDOMECHANICALS) IMPLANT

## 2023-10-06 NOTE — Discharge Instructions (Addendum)

## 2023-10-06 NOTE — H&P (Signed)
 History of Present Illness: Janice Lucas is a 58 y.o. female who is seen today as an office consultation at the request of Dr. Arch for evaluation of Recheck (discuss rob hernia repair for incisional hernia/) .     History of Present Illness Janice Lucas is a 58 year old female who presents for surgical consultation regarding a hernia.   She has a hernia at the ostomy takedown site, causing discomfort and pain, especially when lifting objects. The hernia does not retract on its own and is aggravated by physical activity. It has been present for some time and is described as aggravating and painful.   She has undergone multiple abdominal surgeries, including minimally invasive procedures and one open surgery for cancer resection. The hernia has become more prominent with recent weight loss, attributed to lifestyle changes and medication use.   She is currently taking phentermine  for weight loss and weighs approximately 200 pounds. Her social history includes a physically active lifestyle, with a job requiring walking seven to eight miles a day and engaging in yard work. She has made significant dietary changes, avoiding bread, pasta, and junk food, with occasional treats.         Review of Systems: A complete review of systems was obtained from the patient.  I have reviewed this information and discussed as appropriate with the patient.  See HPI as well for other ROS.   Review of Systems  Constitutional:  Negative for fever.  HENT:  Negative for congestion.   Eyes:  Negative for blurred vision.  Respiratory:  Negative for cough, shortness of breath and wheezing.   Cardiovascular:  Negative for chest pain and palpitations.  Gastrointestinal:  Negative for heartburn.  Genitourinary:  Negative for dysuria.  Musculoskeletal:  Negative for myalgias.  Skin:  Negative for rash.  Neurological:  Negative for dizziness and headaches.  Psychiatric/Behavioral:  Negative for depression and  suicidal ideas.   All other systems reviewed and are negative.       Medical History: Past Medical History: DiagnosisDate            Diabetes mellitus without complication (CMS/HHS-HCC)                 GERD (gastroesophageal reflux disease)                  History of cancer                     Hyperlipidemia                        Hypertension               Sleep apnea         There is no problem list on file for this patient.     Past Surgical History: ProcedureLateralityDate            COLON SURGERY                            SPINE SURGERY                       Allergies AllergenReactions SulfamethoxazoleOther (See Comments)                         Makes me loopy. Neomycin-PolymyxinRash     Current Outpatient Medications on File Prior to  Visit MedicationSigDispenseRefill            ascorbic acid, vitamin C, (VITAMIN C) 1000 MG tablet         Take 1,000 mg by mouth once daily                                atorvastatin  (LIPITOR) 20 MG tablet  Take 20 mg by mouth once daily                               fluticasone  propionate (FLONASE ) 50 mcg/actuation nasal spray   Place 2 sprays into one nostril                                    hydroCHLOROthiazide  (HYDRODIURIL ) 25 MG tablet        Take 25 mg by mouth once daily                               losartan  (COZAAR ) 50 MG tablet       Take 50 mg by mouth once daily                               metFORMIN  (GLUCOPHAGE ) 500 MG tablet          Take 500 mg by mouth daily with breakfast                         nortriptyline  (PAMELOR ) 50 MG capsule      Take 100 mg by mouth                                   omeprazole  (PRILOSEC) 40 MG DR capsule           Take 40 mg by mouth once daily                            potassium chloride  (KLOR-CON  M20) 20 MEQ ER tablet    Take 20 mEq by mouth 2 (two) times daily                               topiramate  (TOPAMAX ) 100 MG tablet          Take 100 mg by mouth 2 (two) times daily                    No current facility-administered medications on file prior to visit.     Family History ProblemRelationAge of Onset            High blood pressure (Hypertension)   Mother             Hyperlipidemia (Elevated cholesterol)            Mother             Diabetes          Mother             Skin cancer     Father  Hyperlipidemia (Elevated cholesterol)            Father              Prostate cancer           Father              Diabetes          Brother                 Social History   Tobacco Use Smoking StatusEvery Day Types:Cigarettes Smokeless TobaccoNot on file     Social History   Socioeconomic History Marital status:Married Tobacco Use Smoking status:Every Day                         Types: Cigarettes Vaping Use Vaping status:Unknown Substance and Sexual Activity Alcohol  use:Yes                         Alcohol /week:  0.0 - 1.0 standard drinks of alcohol  Drug ldz:Wzczm Sexual activity:Defer   Social Drivers of Health   Financial Resource Strain: Low Risk  (01/12/2023)             Received from [facility]             Overall Financial Resource Strain (CARDIA)                        Difficulty of Paying Living Expenses: Not hard at all Food Insecurity: No Food Insecurity (01/12/2023)             Received from [facility]             Hunger Vital Sign                        Within the past 12 months, you worried that your food would run out before you got the money to buy more.: Never true                        Within the past 12 months, the food you bought just didn't last and you didn't have money to get more.: Never true Transportation Needs: No Transportation Needs (01/12/2023)             Received from [facility] - [facility]                        Lack of Transportation (Medical): No                        Lack of Transportation (Non-Medical): No Physical Activity: Unknown (01/12/2023)             Received from [facility]             Exercise Vital Sign                        On average, how many days per week do you engage in moderate to strenuous exercise (like a brisk walk)?: 0 days Stress: No Stress Concern Present (01/12/2023)             Received from [facility]  [employer] of Occupational Health - Occupational Stress Questionnaire                        Feeling of Stress : Not at all Social Connections: Socially Isolated (01/12/2023)             Received from [facility]             Social Connection and Isolation Panel                        In a typical week, how many times do you talk on the phone with family, friends, or neighbors?: More than three times a week                        How often do you get together with friends or relatives?: More than three times a week                        How often do you attend church or religious services?: Never                        Do you belong to any clubs or organizations such as church groups, unions, fraternal or athletic groups, or school groups?: No                        Are you married, widowed, divorced, separated, never married, or living with a partner?: Separated Housing Stability: Unknown (07/13/2023)             Housing Stability Vital Sign                        Homeless in the Last Year: No     Objective:    BP 128/88   Pulse 78   Temp 98.2 F (36.8 C) (Oral)   Resp 18   Ht 5' 6 (1.676 m)   Wt 110.2 kg   LMP 01/29/2011   SpO2 98%   BMI 39.22 kg/m   Physical Exam Constitutional:      Appearance: Normal appearance.  HENT:     Head: Normocephalic and atraumatic.     Mouth/Throat:     Mouth: Mucous membranes are moist.     Pharynx: Oropharynx is clear.  Eyes:     General: No scleral icterus.    Pupils: Pupils are equal, round, and reactive to light.  Cardiovascular:     Rate and Rhythm: Normal rate and regular rhythm.     Pulses: Normal pulses.     Heart sounds: No murmur heard.    No friction  rub. No gallop.  Pulmonary:     Effort: Pulmonary effort is normal. No respiratory distress.     Breath sounds: Normal breath sounds. No stridor.  Abdominal:     General: Abdomen is flat.     Hernia: A hernia is present.    Musculoskeletal:        General: No swelling.  Skin:    General: Skin is warm.  Neurological:     General: No focal deficit present.     Mental Status: She is alert and oriented to person, place, and time. Mental status is at baseline.  Psychiatric:        Mood and Affect: Mood normal.  Thought Content: Thought content normal.        Judgment: Judgment normal.       Hernia Size:4x6cm Incarcerated: no Initial Hernia     Assessment and Plan: Diagnoses and all orders for this visit:   Incisional hernia without obstruction or gangrene     Janice Lucas is a 58 y.o. female    1.          We will proceed to the OR for a robotic ventral hernia repair with mesh, likely eTEP 2.         All risks and benefits were discussed with the patient, to generally include infection, bleeding, damage to surrounding structures, acute and chronic nerve pain, and recurrence. Alternatives were offered and described.  All questions were answered and the patient voiced understanding of the procedure and wishes to proceed at this point.             No follow-ups on file.   Lynda Leos, MD, [facility] Surgery, [place] General & Minimally Invasive Surgery

## 2023-10-06 NOTE — Plan of Care (Signed)
  Problem: Clinical Measurements: Goal: Cardiovascular complication will be avoided Outcome: Progressing   Problem: Activity: Goal: Risk for activity intolerance will decrease Outcome: Progressing   Problem: Elimination: Goal: Will not experience complications related to urinary retention Outcome: Progressing   Problem: Pain Managment: Goal: General experience of comfort will improve and/or be controlled Outcome: Progressing

## 2023-10-06 NOTE — Anesthesia Procedure Notes (Signed)
 Procedure Name: Intubation Date/Time: 10/06/2023 7:39 AM  Performed by: Thomasina Laurence GRADE, RNPre-anesthesia Checklist: Patient identified, Emergency Drugs available, Suction available and Patient being monitored Patient Re-evaluated:Patient Re-evaluated prior to induction Oxygen Delivery Method: Circle system utilized Preoxygenation: Pre-oxygenation with 100% oxygen Induction Type: IV induction Ventilation: Mask ventilation without difficulty Laryngoscope Size: Glidescope and 3 Grade View: Grade I Tube type: Oral Tube size: 7.0 mm Number of attempts: 1 Airway Equipment and Method: Stylet and Oral airway Placement Confirmation: ETT inserted through vocal cords under direct vision, positive ETCO2 and breath sounds checked- equal and bilateral Tube secured with: Tape Dental Injury: Teeth and Oropharynx as per pre-operative assessment

## 2023-10-06 NOTE — Progress Notes (Signed)
 Receive pt from PACU via bed. SWOT RN present on admission, admission and head to toe complete. Pt is alert and oriented, reporting no pain at this time. Pt is aware of the clear liquids at this time. Pt has call button, room phone in reach and orient the room.

## 2023-10-06 NOTE — Anesthesia Postprocedure Evaluation (Signed)
 Anesthesia Post Note  Patient: Janice Lucas  Procedure(s) Performed: REPAIR, HERNIA, VENTRAL, ROBOT-ASSISTED     Patient location during evaluation: PACU Anesthesia Type: General Level of consciousness: awake and alert Pain management: pain level controlled Vital Signs Assessment: post-procedure vital signs reviewed and stable Respiratory status: spontaneous breathing, nonlabored ventilation and respiratory function stable Cardiovascular status: blood pressure returned to baseline and stable Postop Assessment: no apparent nausea or vomiting Anesthetic complications: no   No notable events documented.  Last Vitals:  Vitals:   10/06/23 1100 10/06/23 1115  BP: 113/66 109/69  Pulse: 68 73  Resp: 18 20  Temp:    SpO2: 96% 96%    Last Pain:  Vitals:   10/06/23 1115  TempSrc:   PainSc: 0-No pain                 Butler Levander Pinal

## 2023-10-06 NOTE — Transfer of Care (Signed)
 Immediate Anesthesia Transfer of Care Note  Patient: Janice Lucas  Procedure(s) Performed: REPAIR, HERNIA, VENTRAL, ROBOT-ASSISTED  Patient Location: PACU  Anesthesia Type:General  Level of Consciousness: awake  Airway & Oxygen Therapy: Patient Spontanous Breathing and Patient connected to face mask oxygen  Post-op Assessment: Report given to RN and Post -op Vital signs reviewed and stable  Post vital signs: Reviewed and stable  Last Vitals:  Vitals Value Taken Time  BP 117/76 10/06/23 09:53  Temp    Pulse 79 10/06/23 09:55  Resp 20 10/06/23 09:56  SpO2 91 % 10/06/23 09:55  Vitals shown include unfiled device data.  Last Pain:  Vitals:   10/06/23 0605  TempSrc:   PainSc: 0-No pain      Patients Stated Pain Goal: 0 (10/06/23 0559)  Complications: No notable events documented.

## 2023-10-06 NOTE — Op Note (Signed)
 10/06/2023  9:41 AM  PATIENT:  Janice Lucas  58 y.o. female  PRE-OPERATIVE DIAGNOSIS:  INCISIONAL HERNIA 8x7cm  POST-OPERATIVE DIAGNOSIS:  INCISIONAL HERNIA 8x7cm  PROCEDURE:  Procedure(s) with comments: REPAIR, HERNIA, VENTRAL, ROBOT-ASSISTED (N/A) - ROBOTIC INCISIONAL HERNIA REPAIR WITH MESH ETEP WITH PLACEMENT OF 21X20CM BARD SOFT MESH  SURGEON:  Surgeons and Role:    * Rubin Calamity, MD - Primary  ASSISTANTS: Powell Seats, RNFA   ANESTHESIA:   local and general  EBL:  minimal   BLOOD ADMINISTERED:none  DRAINS: none   LOCAL MEDICATIONS USED:  BUPIVICAINE   SPECIMEN:  No Specimen  DISPOSITION OF SPECIMEN:  N/A  COUNTS:  YES  TOURNIQUET:  * No tourniquets in log *  DICTATION: .Dragon Dictation  Findings: Patient with a large 8 x 7 cm incisional hernia at the previous ostomy site.  A piece of 20 x 21 cm Bard soft mesh was placed into the retrorectus space.  This was secured using Vistaseal .  The mesh lay flat at the end of the case.  Details of the procedure:  After the patient was consented he was taken back to the operating room and placed in the supine position with bilateral SCDs in place. The patient was prepped and draped in usual sterile fashion. Antibiotics were confirmed and timeout was called and all facts verified.  A 5 mm trocar and camera were then introduced into the right retrorectus space in the left upper quadrant.  Pneumoperitoneum was begun.  I then proceeded to bluntly dissect the left retrorectus space down to the pelvis.  At this time two 8 mm robotic trocars were placed under direct visualization along the anterior axillary line under direct visualization.  The 5 mm trocar was replaced with a robotic 8 mm trocar.  I proceeded to cross over the subxiphoid area.  I was able to visualize the right posterior rectus fascia and incised this.  I was able to create a space.  I proceeded to dissect this inferiorly.  At this time robot was  docked.   I was able to incise the retrorectus fascia superiorly and inferiorly on the left until I encountered the hernia.  At this time I entered the hernia sac as it was very thin.  I was able to circumferentially dissect away the hernia sac away from the anterior abdominal wall.  There was some small bowel adhesions to the hernia sac.  These were taken down sharply.  At this time I was able to dissect away the rectus fascia and the muscle belly separately.  I continued to dissect this inferiorly past the hernia.  This was dissected approximately 5 cm inferior to the hernia site.  This retrorectus dissection of the posterior rectus fascia was mirrored on the left side.  At this time a 0  STRATAFIX stitch was used x 2 to reapproximate the posterior retrorectus fascia and the peritoneum at the site of the hernia.  This was also done on the anterior rectus fascia.  At this time the robot was undocked.  A ruler was placed to the preperitoneal space.  The retrorectus space measured 21 x 20 cm.  At this time a piece of Bard soft mesh was cut to shape and placed the preperitoneal space.  This was laid out flat.  At this time Vistaseal  was then used to help fasten the mesh to the retrorectus fascia.  The mesh lay flat.  The insufflation was evacuated.  All trocars were removed.  Trocar sites were  then reapproximated and 4-0 Monocryl subcuticular fashion.  Patient tolerated procedure well was taken to the recovery in stable condition.    PLAN OF CARE: Admit for overnight observation  PATIENT DISPOSITION:  PACU - hemodynamically stable.   Delay start of Pharmacological VTE agent (>24hrs) due to surgical blood loss or risk of bleeding: not applicable

## 2023-10-07 ENCOUNTER — Other Ambulatory Visit (HOSPITAL_BASED_OUTPATIENT_CLINIC_OR_DEPARTMENT_OTHER): Payer: Self-pay

## 2023-10-07 ENCOUNTER — Encounter (HOSPITAL_COMMUNITY): Payer: Self-pay | Admitting: General Surgery

## 2023-10-07 ENCOUNTER — Other Ambulatory Visit (HOSPITAL_COMMUNITY): Payer: Self-pay

## 2023-10-07 DIAGNOSIS — F17210 Nicotine dependence, cigarettes, uncomplicated: Secondary | ICD-10-CM | POA: Diagnosis not present

## 2023-10-07 DIAGNOSIS — Z79899 Other long term (current) drug therapy: Secondary | ICD-10-CM | POA: Diagnosis not present

## 2023-10-07 DIAGNOSIS — F1090 Alcohol use, unspecified, uncomplicated: Secondary | ICD-10-CM | POA: Diagnosis not present

## 2023-10-07 DIAGNOSIS — K432 Incisional hernia without obstruction or gangrene: Secondary | ICD-10-CM | POA: Diagnosis not present

## 2023-10-07 DIAGNOSIS — I10 Essential (primary) hypertension: Secondary | ICD-10-CM | POA: Diagnosis not present

## 2023-10-07 DIAGNOSIS — Z23 Encounter for immunization: Secondary | ICD-10-CM | POA: Diagnosis not present

## 2023-10-07 DIAGNOSIS — E119 Type 2 diabetes mellitus without complications: Secondary | ICD-10-CM | POA: Diagnosis not present

## 2023-10-07 MED ORDER — TRAMADOL HCL 50 MG PO TABS
50.0000 mg | ORAL_TABLET | Freq: Four times a day (QID) | ORAL | 0 refills | Status: AC | PRN
  Filled 2023-10-07 (×2): qty 20, 5d supply, fill #0

## 2023-10-07 NOTE — Discharge Summary (Signed)
 Physician Discharge Summary  Patient ID: Janice Lucas MRN: [id number] DOB/AGE: [birth year] 58 y.o.  Admit date: 10/06/2023 Discharge date: 10/07/2023  Admission Diagnoses: Incisional hernia  Discharge Diagnoses:  Principal Problem:   S/P repair of ventral hernia   Discharged Condition: good  Hospital Course: Patient underwent robotic incisional hernia repair with mesh.  Please see operative note full details.  Postoperative patient was sent to the floor.  She was transition to a regular diet and tolerated this well.  She was otherwise ambulatory at home.  She had good pain control.  She was deemed stable for discharge and discharged home.  Consults: None  Significant Diagnostic Studies: None  Treatments: surgery: As above  Discharge Exam: Blood pressure 114/68, pulse 72, temperature 98.1 F (36.7 C), temperature source Oral, resp. rate 17, height 5' 6 (1.676 m), weight 110.2 kg, last menstrual period 01/29/2011, SpO2 97%. General appearance: alert and cooperative GI: soft, non-tender; bowel sounds normal; no masses,  no organomegaly and incision clean dry and intact.  Disposition: Discharge disposition: 01-Home or Self Care       Discharge Instructions     Diet - low sodium heart healthy   Complete by: As directed    Increase activity slowly   Complete by: As directed       Allergies as of 10/07/2023       Reactions   Sulfamethoxazole Other (See Comments)   Makes me loopy.   Neomycin-bacitracin  Zn-polymyx Rash   Sulfa Antibiotics Other (See Comments)   Acts goofy        Medication List     TAKE these medications    acetaminophen  500 MG tablet Commonly known as: TYLENOL  Take 500 mg by mouth every 6 (six) hours as needed for moderate pain (pain score 4-6).   atorvastatin  40 MG tablet Commonly known as: LIPITOR Take 1 tablet (40 mg total) by mouth daily.   Biotin 10000 MCG Tabs Take 1 tablet by mouth daily.   CALCIUM  1000 + D PO Take 1  tablet by mouth 2 (two) times daily.   cholecalciferol 25 MCG (1000 UNIT) tablet Commonly known as: VITAMIN D3 Take 1,000 Units by mouth daily.   cyclobenzaprine  10 MG tablet Commonly known as: FLEXERIL  Take 1 tablet (10 mg total) by mouth 2 (two) times daily as needed for muscle spasms.   fluticasone  50 MCG/ACT nasal spray Commonly known as: FLONASE  Place 2 sprays into both nostrils daily as needed for allergies.   hydrochlorothiazide  25 MG tablet Commonly known as: HYDRODIURIL  Take 1 tablet (25 mg total) by mouth daily.   losartan  50 MG tablet Commonly known as: COZAAR  Take 1 tablet (50 mg total) by mouth daily.   magnesium 30 MG tablet Take 30 mg by mouth 2 (two) times daily.   metFORMIN  500 MG tablet Commonly known as: GLUCOPHAGE  Take 1 tablet (500 mg total) by mouth daily with breakfast.   nortriptyline  50 MG capsule Commonly known as: PAMELOR  Take 2 capsules (100 mg total) by mouth every night as directed.   omeprazole  40 MG capsule Commonly known as: PRILOSEC Take 1 capsule (40 mg total) by mouth daily.   potassium chloride  SA 20 MEQ tablet Commonly known as: KLOR-CON  M Take 1 tablet (20 mEq total) by mouth 2 (two) times daily.   topiramate  100 MG tablet Commonly known as: TOPAMAX  Take 1 tablet (100 mg total) by mouth 2 (two) times daily.   traMADol  50 MG tablet Commonly known as: Ultram  Take 1 tablet (50 mg total)  by mouth every 6 (six) hours as needed.   vitamin C 1000 MG tablet Take 1,000 mg by mouth daily.   vitamin E 1000 UNIT capsule Take 1,000 Units by mouth daily.        Follow-up Information     Rubin Calamity, MD. Schedule an appointment as soon as possible for a visit in 2 week(s).   Specialty: General Surgery Why: Post op visit Contact information: [address] [place] [address] [phone]                 Signed: Calamity Rubin 10/07/2023, 9:27 AM

## 2023-10-07 NOTE — Progress Notes (Signed)
 Janice Lucas to be D/C'd  per MD order.  Discussed with the patient and all questions fully answered.  VSS, Skin clean, dry and intact without evidence of skin break down, no evidence of skin tears noted.  IV catheter discontinued intact. Site without signs and symptoms of complications. Dressing and pressure applied.  An After Visit Summary was printed and given to the patient. Patient received prescription from [facility] pharmacy.  D/c education completed with patient/family including follow up instructions, medication list, d/c activities limitations if indicated, with other d/c instructions as indicated by MD - patient able to verbalize understanding, all questions fully answered.   Patient instructed to return to ED, call 911, or call MD for any changes in condition.   Patient to be escorted via WC, and D/C home via private auto.

## 2023-10-22 NOTE — Progress Notes (Signed)
 [facility] [address] [place], [place] [address] [phone]  Date:  10/28/2023   Name:  Janice Lucas   DOB:  November 16, 1965   MRN:  [id number]  PCP:  Watt Harlene BROCKS, MD    Chief Complaint: Cough (Onset 10/21/2023 after leaving the hospital /Cough and congestion )   History of Present Illness:  Janice Lucas is a 58 y.o. very pleasant female patient who presents with the following:  Patient seen today with concern of a head cold.  I saw her most recently in August for routine follow-up - history of HTN, migraine HA, hyperlipidemia, intracranial hypertension/ pseudotumor cerebri dx in 2015, mild diabetes.  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 She is not quite sure when she quit   Discussed the use of AI scribe software for clinical note transcription with the patient, who gave verbal consent to proceed.  History of Present Illness Janice Lucas is a 58 year old female who presents with a cough and sinus congestion.  She has experienced a persistent cough and sinus congestion since  October 07, 2023. The cough is not a 'hacking cough' typical of bronchitis. No sinus pressure, pain, fever, or chills. She has not attempted any treatments as the symptoms have not been bothersome and have persisted without significant change.  She recently underwent arthroscopic hernia surgery and reports mild soreness and tenderness at the hernia site, which is aggravated by coughing.  She has noticed a tender spot on her gum that appeared about a week ago, with no associated tooth pain. No recent burns or injuries to the area.  She has a history of smoking a pack a day for about twenty years and currently vapes. No shortness of breath or calf pain or cramping when walking. She received her flu and pneumonia vaccines during a recent hospital stay.                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   Class 3 severe obesity due to excess calories with serious comorbidity and body mass index (BMI) of 45.0 to 49.9 in adult [facility]) 11/19/2022   Insufficiency fracture of medial femoral condyle (HCC) 04/17/2022   Degenerative tear of left medial meniscus 04/17/2022   Estrogen deficiency 04/17/2022   Rectal cancer (HCC) 04/26/2021   Goals of care, counseling/discussion 04/26/2021   Allergic  rhinitis 05/15/2020   Hypertension 07/05/2018   Migraine without aura and without status migrainosus, not intractable 02/08/2016   Depression 10/10/2015   Injury by electrocution 10/10/2015   Mixed hyperlipidemia 10/10/2015   Prediabetes 10/10/2015   Urinary incontinence 10/10/2015   Sleep apnea 08/30/2015   IIH (idiopathic intracranial hypertension) 11/14/2013   Herniated nucleus pulposus, C6-7 right 02/14/2011    Past Medical History:  Diagnosis Date   Allergy    SEASONAL -  MILD   Diabetes mellitus without complication (HCC)    diet controlled/with meds   GERD (gastroesophageal reflux disease)    ON OMEPRAZOLE    Goals of care, counseling/discussion 04/26/2021   Hyperlipidemia    ON MEDS   Hypertension    ON MEDS   Rectal cancer (HCC) 04/26/2021   Sleep apnea    WEARS CPAP    Past Surgical History:  Procedure Laterality Date   ANTERIOR CERVICAL DECOMP/DISCECTOMY FUSION  02/14/2011   Procedure: ANTERIOR CERVICAL DECOMPRESSION/DISCECTOMY FUSION 1 LEVEL;  Surgeon: Victory JINNY Gens, MD;  Location: [facility];  Service: Neurosurgery;  Laterality: N/A;  Anterior Cervical Six-Seven Decompression and Fusion   COLON SURGERY  09/2021   COLON SURGERY  12/2021   IR IMAGING GUIDED PORT INSERTION  05/02/2021   XI ROBOTIC ASSISTED VENTRAL HERNIA N/A 10/06/2023   Procedure: REPAIR, HERNIA, VENTRAL, ROBOT-ASSISTED;  Surgeon: Rubin Calamity, MD;  Location: [facility] OR;  Service: General;  Laterality: N/A;  ROBOTIC INCISIONAL HERNIA REPAIR WITH MESH ETEP    Social History   Tobacco Use   Smoking status: Former    Current packs/day: 1.00    Average packs/day: 1 pack/day for 20.0 years (20.0 ttl pk-yrs)    Types: Cigarettes   Smokeless tobacco: Never   Tobacco comments:    VAPES   Vaping Use   Vaping status: Every Day   Substances: Nicotine, Flavoring  Substance Use Topics   Alcohol  use: No    Comment: occassionally   Drug use: Not Currently    Family History  Problem Relation Age of Onset   Colon polyps Father    Prostate cancer Father        METS TO LYMPH NODES AND BLADDER   Diabetes Maternal Aunt    Diabetes Maternal Uncle    Colon cancer Neg Hx    Esophageal cancer Neg Hx    Rectal cancer Neg Hx    Stomach cancer Neg Hx     Allergies  Allergen Reactions   Sulfamethoxazole Other (See Comments)    Makes me loopy.   Neomycin-Bacitracin  Zn-Polymyx Rash   Sulfa Antibiotics Other (See Comments)    Acts goofy    Medication list has been reviewed  and updated.  Current Outpatient Medications on File Prior to Visit  Medication Sig Dispense Refill   acetaminophen  (TYLENOL ) 500 MG tablet Take 500 mg by mouth every 6 (six) hours as needed for moderate pain (pain score 4-6).     Ascorbic Acid (VITAMIN C) 1000 MG tablet Take 1,000 mg by mouth daily.     atorvastatin  (LIPITOR) 40 MG tablet Take 1 tablet (40 mg total) by mouth daily. 90 tablet 3   Biotin 10000 MCG TABS Take 1 tablet by mouth daily.     Calcium  Carb-Cholecalciferol (CALCIUM  1000 + D PO) Take 1 tablet by mouth 2 (two) times daily.     cholecalciferol (VITAMIN D3) 25 MCG (1000 UT) tablet Take 1,000 Units by mouth daily.     fluticasone  (FLONASE ) 50 MCG/ACT nasal spray Place 2  sprays into both nostrils daily as needed for allergies. 16 g 11   hydrochlorothiazide  (HYDRODIURIL ) 25 MG tablet Take 1 tablet (25 mg total) by mouth daily. 90 tablet 3   losartan  (COZAAR ) 50 MG tablet Take 1 tablet (50 mg total) by mouth daily. 90 tablet 3   magnesium 30 MG tablet Take 30 mg by mouth 2 (two) times daily.     metFORMIN  (GLUCOPHAGE ) 500 MG tablet Take 1 tablet (500 mg total) by mouth daily with breakfast. 90 tablet 3   nortriptyline  (PAMELOR ) 50 MG capsule Take 2 capsules (100 mg total) by mouth every night as directed. 180 capsule 4   omeprazole  (PRILOSEC) 40 MG capsule Take 1 capsule (40 mg total) by mouth daily. 90 capsule 1   potassium chloride  SA (KLOR-CON  M) 20 MEQ tablet Take 1 tablet (20 mEq total) by mouth 2 (two) times daily. 60 tablet 6   topiramate  (TOPAMAX ) 100 MG tablet Take 1 tablet (100 mg total) by mouth 2 (two) times daily. 180 tablet 4   vitamin E 1000 UNIT capsule Take 1,000 Units by mouth daily.     cyclobenzaprine  (FLEXERIL ) 10 MG tablet Take 1 tablet (10 mg total) by mouth 2 (two) times daily as needed for muscle spasms. (Patient not taking: Reported on 10/28/2023) 30 tablet 0   traMADol  (ULTRAM ) 50 MG tablet Take 1 tablet (50 mg total) by mouth every 6 (six) hours as  needed. (Patient not taking: Reported on 10/28/2023) 20 tablet 0   [DISCONTINUED] prochlorperazine  (COMPAZINE ) 10 MG tablet Take 1 tablet (10 mg total) by mouth every 6 (six) hours as needed (Nausea or vomiting). (Patient not taking: Reported on 09/18/2023) 30 tablet 1   No current facility-administered medications on file prior to visit.    Review of Systems:  As per HPI- otherwise negative.   Physical Examination: Vitals:   10/28/23 0827  BP: 108/64  Pulse: 82  Temp: 98.1 F (36.7 C)  SpO2: 98%   Vitals:   10/28/23 0827  Weight: 248 lb 6.4 oz (112.7 kg)  Height: 5' 6 (1.676 m)   Body mass index is 40.09 kg/m. Ideal Body Weight: Weight in (lb) to have BMI = 25: 154.6  GEN: no acute distress.  Obese, looks well HEENT: Atraumatic, Normocephalic.  Bilateral TM wnl, oropharynx normal.  PEERL,EOMI.   She does have a small sore on her gum medial to I believe tooth #3 Ears and Nose: No external deformity. CV: RRR, No M/G/R. No JVD. No thrill. No extra heart sounds. PULM: CTA B, no wheezes, crackles, rhonchi. No retractions. No resp. distress. No accessory muscle use. ABD: S, NT, ND, +BS. No rebound. No HSM.  Well-healed sites from recent laparoscopic hernia repair EXTR: No c/c/e PSYCH: Normally interactive. Conversant.   Wt Readings from Last 3 Encounters:  10/28/23 248 lb 6.4 oz (112.7 kg)  10/06/23 243 lb (110.2 kg)  10/01/23 250 lb 3.2 oz (113.5 kg)     Assessment and Plan: Sinus congestion - Plan: doxycycline  (VIBRAMYCIN ) 100 MG capsule, DISCONTINUED: predniSONE  (DELTASONE ) 20 MG tablet  Screening for lung cancer - Plan: CT CHEST LUNG CA SCREEN LOW DOSE W/O CM  BMI 40.0-44.9, adult (HCC) - Plan: phentermine  15 MG capsule  Assessment & Plan Acute upper respiratory symptoms (cough and sinus congestion) Cough and sinus congestion since September 24. Differential includes bacterial infection versus allergies. - Prescribe short course of prednisone , pending surgeon's  approval.-Patient let us  know that surgeon does not wish her to be on prednisone  this  early after surgery.  We decided to use doxycycline  instead  Recent hernia repair, post-operative care Soreness and tenderness noted, especially when coughing. Advised to avoid lifting over 20 pounds. - Confirm with surgeon that prednisone  will not impede healing.  Oral mucosal lesion, under observation Lesion on gum present for about a week, tender to touch. Increased risk for oral cancer due to smoking. - If lesion does not heal in 1-2 weeks, consult dentist for evaluation and possible biopsy.  Screening for lung cancer, planned Qualifies for lung cancer screening due to 20-year smoking history. Currently uses vaping products.  Encouraged her to stop tobacco - Order low intensity CT scan for lung cancer screening.  Signed Harlene Schroeder, MD

## 2023-10-28 ENCOUNTER — Encounter: Payer: Self-pay | Admitting: Family Medicine

## 2023-10-28 ENCOUNTER — Ambulatory Visit: Admitting: Family Medicine

## 2023-10-28 ENCOUNTER — Other Ambulatory Visit (HOSPITAL_BASED_OUTPATIENT_CLINIC_OR_DEPARTMENT_OTHER): Payer: Self-pay

## 2023-10-28 VITALS — BP 108/64 | HR 82 | Temp 98.1°F | Ht 66.0 in | Wt 248.4 lb

## 2023-10-28 DIAGNOSIS — Z4889 Encounter for other specified surgical aftercare: Secondary | ICD-10-CM | POA: Diagnosis not present

## 2023-10-28 DIAGNOSIS — Z6841 Body Mass Index (BMI) 40.0 and over, adult: Secondary | ICD-10-CM | POA: Diagnosis not present

## 2023-10-28 DIAGNOSIS — Z8719 Personal history of other diseases of the digestive system: Secondary | ICD-10-CM | POA: Diagnosis not present

## 2023-10-28 DIAGNOSIS — R0981 Nasal congestion: Secondary | ICD-10-CM | POA: Diagnosis not present

## 2023-10-28 DIAGNOSIS — Z122 Encounter for screening for malignant neoplasm of respiratory organs: Secondary | ICD-10-CM

## 2023-10-28 MED ORDER — DOXYCYCLINE HYCLATE 100 MG PO CAPS
100.0000 mg | ORAL_CAPSULE | Freq: Two times a day (BID) | ORAL | 0 refills | Status: AC
  Filled 2023-10-28: qty 20, 10d supply, fill #0

## 2023-10-28 MED ORDER — PHENTERMINE HCL 15 MG PO CAPS
15.0000 mg | ORAL_CAPSULE | ORAL | 1 refills | Status: AC
  Filled 2023-10-28: qty 30, 30d supply, fill #0
  Filled 2024-01-06: qty 30, 30d supply, fill #1

## 2023-10-28 MED ORDER — PREDNISONE 20 MG PO TABS
ORAL_TABLET | ORAL | 0 refills | Status: DC
  Filled 2023-10-28: qty 9, 6d supply, fill #0

## 2023-10-28 NOTE — Patient Instructions (Addendum)
 Good to see you today I will work on setting up a lung cancer screening CT for you- let me know if you don't hear about this scan I think some prednisone  will help clear up your cough and congestion- however make sure your surgeon is ok with your taking this so soon after your operation  If all is well please see me in about 4 months for check in  Happy birthday!   JC

## 2023-10-29 ENCOUNTER — Encounter: Payer: Self-pay | Admitting: Family Medicine

## 2023-10-29 ENCOUNTER — Other Ambulatory Visit: Payer: Self-pay

## 2023-10-29 ENCOUNTER — Other Ambulatory Visit (HOSPITAL_BASED_OUTPATIENT_CLINIC_OR_DEPARTMENT_OTHER): Payer: Self-pay

## 2023-10-29 DIAGNOSIS — K219 Gastro-esophageal reflux disease without esophagitis: Secondary | ICD-10-CM

## 2023-10-29 DIAGNOSIS — E118 Type 2 diabetes mellitus with unspecified complications: Secondary | ICD-10-CM

## 2023-10-29 DIAGNOSIS — I10 Essential (primary) hypertension: Secondary | ICD-10-CM

## 2023-10-29 MED ORDER — HYDROCHLOROTHIAZIDE 25 MG PO TABS
25.0000 mg | ORAL_TABLET | Freq: Every day | ORAL | 3 refills | Status: AC
  Filled 2023-10-29 – 2023-12-13 (×2): qty 90, 90d supply, fill #0

## 2023-10-29 MED ORDER — OMEPRAZOLE 40 MG PO CPDR
40.0000 mg | DELAYED_RELEASE_CAPSULE | Freq: Every day | ORAL | 3 refills | Status: AC
  Filled 2023-10-29: qty 90, 90d supply, fill #0
  Filled 2024-02-03: qty 90, 90d supply, fill #1

## 2023-10-29 MED ORDER — METFORMIN HCL 500 MG PO TABS
500.0000 mg | ORAL_TABLET | Freq: Every day | ORAL | 3 refills | Status: AC
  Filled 2023-10-29: qty 90, 90d supply, fill #0
  Filled 2024-02-03: qty 90, 90d supply, fill #1

## 2023-11-04 ENCOUNTER — Ambulatory Visit (HOSPITAL_BASED_OUTPATIENT_CLINIC_OR_DEPARTMENT_OTHER)
Admission: RE | Admit: 2023-11-04 | Discharge: 2023-11-04 | Disposition: A | Discharge status: Home / Self Care | Source: Ambulatory Visit | Attending: Family Medicine | Admitting: Family Medicine

## 2023-11-04 DIAGNOSIS — F17210 Nicotine dependence, cigarettes, uncomplicated: Secondary | ICD-10-CM | POA: Diagnosis not present

## 2023-11-04 DIAGNOSIS — Z122 Encounter for screening for malignant neoplasm of respiratory organs: Secondary | ICD-10-CM | POA: Diagnosis not present

## 2023-11-05 ENCOUNTER — Other Ambulatory Visit (HOSPITAL_COMMUNITY): Payer: Self-pay

## 2023-11-09 ENCOUNTER — Other Ambulatory Visit (HOSPITAL_BASED_OUTPATIENT_CLINIC_OR_DEPARTMENT_OTHER): Payer: Self-pay

## 2023-11-10 ENCOUNTER — Other Ambulatory Visit (HOSPITAL_BASED_OUTPATIENT_CLINIC_OR_DEPARTMENT_OTHER): Payer: Self-pay

## 2023-11-10 ENCOUNTER — Other Ambulatory Visit: Payer: Self-pay

## 2023-11-10 ENCOUNTER — Inpatient Hospital Stay: Attending: Hematology & Oncology

## 2023-11-10 DIAGNOSIS — Z85048 Personal history of other malignant neoplasm of rectum, rectosigmoid junction, and anus: Secondary | ICD-10-CM | POA: Insufficient documentation

## 2023-11-10 DIAGNOSIS — Z9221 Personal history of antineoplastic chemotherapy: Secondary | ICD-10-CM | POA: Diagnosis not present

## 2023-11-10 DIAGNOSIS — Z452 Encounter for adjustment and management of vascular access device: Secondary | ICD-10-CM | POA: Insufficient documentation

## 2023-11-10 NOTE — Patient Instructions (Signed)

## 2023-11-12 ENCOUNTER — Encounter: Payer: Self-pay | Admitting: Family Medicine

## 2023-11-12 DIAGNOSIS — H524 Presbyopia: Secondary | ICD-10-CM | POA: Diagnosis not present

## 2023-11-12 DIAGNOSIS — H05113 Granuloma of bilateral orbits: Secondary | ICD-10-CM | POA: Diagnosis not present

## 2023-11-12 DIAGNOSIS — H40053 Ocular hypertension, bilateral: Secondary | ICD-10-CM | POA: Diagnosis not present

## 2023-11-12 DIAGNOSIS — E119 Type 2 diabetes mellitus without complications: Secondary | ICD-10-CM | POA: Diagnosis not present

## 2023-11-12 LAB — OPHTHALMOLOGY REPORT-SCANNED

## 2023-11-16 ENCOUNTER — Encounter: Payer: Self-pay | Admitting: Radiology

## 2023-12-02 ENCOUNTER — Encounter: Payer: Self-pay | Admitting: Neurology

## 2023-12-08 ENCOUNTER — Other Ambulatory Visit (HOSPITAL_BASED_OUTPATIENT_CLINIC_OR_DEPARTMENT_OTHER): Payer: Self-pay

## 2023-12-08 ENCOUNTER — Other Ambulatory Visit: Payer: Self-pay

## 2023-12-08 ENCOUNTER — Encounter: Payer: Self-pay | Admitting: Family Medicine

## 2023-12-08 MED ORDER — PHENTERMINE HCL 30 MG PO CAPS
30.0000 mg | ORAL_CAPSULE | ORAL | 2 refills | Status: AC
  Filled 2023-12-08: qty 30, 30d supply, fill #0
  Filled 2024-01-06 – 2024-02-03 (×2): qty 30, 30d supply, fill #1

## 2023-12-08 NOTE — Addendum Note (Signed)
 Addended by: WATT RAISIN C on: 12/08/2023 02:18 PM   Modules accepted: Orders

## 2023-12-13 ENCOUNTER — Other Ambulatory Visit (HOSPITAL_BASED_OUTPATIENT_CLINIC_OR_DEPARTMENT_OTHER): Payer: Self-pay

## 2023-12-14 ENCOUNTER — Other Ambulatory Visit: Payer: Self-pay

## 2023-12-21 ENCOUNTER — Inpatient Hospital Stay: Attending: Hematology & Oncology

## 2023-12-21 ENCOUNTER — Inpatient Hospital Stay: Admitting: Hematology & Oncology

## 2023-12-21 ENCOUNTER — Inpatient Hospital Stay

## 2023-12-30 ENCOUNTER — Inpatient Hospital Stay: Attending: Hematology & Oncology

## 2023-12-30 ENCOUNTER — Encounter: Payer: Self-pay | Admitting: Hematology & Oncology

## 2023-12-30 ENCOUNTER — Inpatient Hospital Stay: Admitting: Hematology & Oncology

## 2023-12-30 ENCOUNTER — Inpatient Hospital Stay

## 2023-12-30 VITALS — BP 118/67 | HR 88 | Temp 97.8°F | Resp 20 | Ht 66.0 in | Wt 263.0 lb

## 2023-12-30 DIAGNOSIS — Z9221 Personal history of antineoplastic chemotherapy: Secondary | ICD-10-CM | POA: Diagnosis not present

## 2023-12-30 DIAGNOSIS — Z85048 Personal history of other malignant neoplasm of rectum, rectosigmoid junction, and anus: Secondary | ICD-10-CM | POA: Insufficient documentation

## 2023-12-30 DIAGNOSIS — C20 Malignant neoplasm of rectum: Secondary | ICD-10-CM

## 2023-12-30 DIAGNOSIS — Z452 Encounter for adjustment and management of vascular access device: Secondary | ICD-10-CM | POA: Insufficient documentation

## 2023-12-30 LAB — CMP (CANCER CENTER ONLY)
ALT: 15 U/L (ref 0–44)
AST: 22 U/L (ref 15–41)
Albumin: 4.2 g/dL (ref 3.5–5.0)
Alkaline Phosphatase: 87 U/L (ref 38–126)
Anion gap: 12 (ref 5–15)
BUN: 22 mg/dL — ABNORMAL HIGH (ref 6–20)
CO2: 24 mmol/L (ref 22–32)
Calcium: 8.6 mg/dL — ABNORMAL LOW (ref 8.9–10.3)
Chloride: 102 mmol/L (ref 98–111)
Creatinine: 0.83 mg/dL (ref 0.44–1.00)
GFR, Estimated: >60 mL/min (ref >60)
Glucose, Bld: 104 mg/dL — ABNORMAL HIGH (ref 70–99)
Potassium: 3.4 mmol/L — ABNORMAL LOW (ref 3.5–5.1)
Sodium: 139 mmol/L (ref 135–145)
Total Bilirubin: 0.3 mg/dL (ref 0.0–1.2)
Total Protein: 7.1 g/dL (ref 6.5–8.1)

## 2023-12-30 LAB — CBC WITH DIFFERENTIAL (CANCER CENTER ONLY)
Abs Immature Granulocytes: 0.02 K/uL (ref 0.00–0.07)
Basophils Absolute: 0 K/uL (ref 0.0–0.1)
Basophils Relative: 1 %
Eosinophils Absolute: 0.2 K/uL (ref 0.0–0.5)
Eosinophils Relative: 3 %
HCT: 37.4 % (ref 36.0–46.0)
Hemoglobin: 12.4 g/dL (ref 12.0–15.0)
Immature Granulocytes: 0 %
Lymphocytes Relative: 31 %
Lymphs Abs: 2.1 K/uL (ref 0.7–4.0)
MCH: 28.3 pg (ref 26.0–34.0)
MCHC: 33.2 g/dL (ref 30.0–36.0)
MCV: 85.4 fL (ref 80.0–100.0)
Monocytes Absolute: 0.5 K/uL (ref 0.1–1.0)
Monocytes Relative: 7 %
Neutro Abs: 3.8 K/uL (ref 1.7–7.7)
Neutrophils Relative %: 58 %
Platelet Count: 273 K/uL (ref 150–400)
RBC: 4.38 MIL/uL (ref 3.87–5.11)
RDW: 14.7 % (ref 11.5–15.5)
WBC Count: 6.6 K/uL (ref 4.0–10.5)
nRBC: 0 % (ref 0.0–0.2)

## 2023-12-30 LAB — CEA (ACCESS): CEA (CHCC): 2.61 ng/mL (ref 0.00–5.00)

## 2023-12-30 LAB — LACTATE DEHYDROGENASE: LDH: 185 U/L (ref 105–235)

## 2023-12-30 NOTE — Patient Instructions (Signed)

## 2023-12-30 NOTE — Progress Notes (Signed)
 + Hematology and Oncology Follow Up Visit  Janice Lucas [id number] 07-14-1965 58 y.o. 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 is here today for follow-up.  She had hernia repair back in September.  This was a abdominal wall hernia.  This was done laparoscopically.  She was in the hospital overnight.  Her main problem right now has been sciatica.  She has sciatica in the right leg.  This happened about 2 or 3 months ago.  She does not recall any trauma.  She has had no issues with weakness.  She has had no change in bowel or bladder habits.  She is having no fever.  There is been no cough or shortness of breath.  Her last CEA level was 2.7.  There has been no fever.  She has had no bleeding.  She has had no headache.  Overall, her performance status is probably ECOG 1.        Medications:  Allergies as of 12/30/2023       Reactions   Sulfamethoxazole Other (See Comments)   Makes me loopy.   Neomycin-bacitracin  Zn-polymyx Rash   Sulfa Antibiotics Other (See Comments)   Acts goofy        Medication List        Accurate as of December 30, 2023  4:12 PM. If you have any questions, ask your nurse or doctor.          STOP taking these medications    magnesium 30 MG tablet Stopped by: Maude Crease, MD       TAKE these medications    acetaminophen  500 MG tablet Commonly known as: TYLENOL  Take 500 mg by mouth every 6 (six) hours as needed for moderate pain (pain score 4-6).   atorvastatin  40 MG tablet Commonly known as: LIPITOR Take 1 tablet (40 mg total) by mouth daily.   Biotin 10000  MCG Tabs Take 1 tablet by mouth daily.   CALCIUM  1000 + D PO Take 1 tablet by mouth 2 (two) times daily.   cholecalciferol 25 MCG (1000 UNIT) tablet Commonly known as: VITAMIN D3 Take 1,000 Units by mouth daily.   cyclobenzaprine  10 MG tablet Commonly known as: FLEXERIL  Take 1 tablet (10 mg total) by mouth 2 (two) times daily as needed for muscle spasms.   doxycycline  100 MG capsule Commonly known as: VIBRAMYCIN  Take 1 capsule (100 mg total) by mouth 2 (two) times daily.   fluticasone  50 MCG/ACT nasal spray Commonly known as: FLONASE  Place 2 sprays into both nostrils daily as needed for allergies.   hydrochlorothiazide  25 MG tablet Commonly known as: HYDRODIURIL  Take 1 tablet (25 mg total) by mouth daily.   losartan  50 MG tablet Commonly known as: COZAAR  Take 1 tablet (50 mg total) by mouth daily.   metFORMIN  500 MG tablet Commonly known as: GLUCOPHAGE  Take 1 tablet (500 mg total) by mouth daily with breakfast.   nortriptyline  50 MG capsule Commonly known as: PAMELOR  Take 2 capsules (100 mg total) by mouth every night as directed.   omeprazole  40 MG capsule Commonly known as: PRILOSEC Take 1 capsule (  40 mg total) by mouth daily.   phentermine  15 MG capsule Take 1 capsule (15 mg total) by mouth every morning.   phentermine  30 MG capsule Take 1 capsule (30 mg total) by mouth every morning.   potassium chloride  SA 20 MEQ tablet Commonly known as: KLOR-CON  M Take 1 tablet (20 mEq total) by mouth 2 (two) times daily.   topiramate  100 MG tablet Commonly known as: TOPAMAX  Take 1 tablet (100 mg total) by mouth 2 (two) times daily.   traMADol  50 MG tablet Commonly known as: Ultram  Take 1 tablet (50 mg total) by mouth every 6 (six) hours as needed.   vitamin C 1000 MG tablet Take 1,000 mg by mouth daily.   vitamin E 1000 UNIT capsule Take 1,000 Units by mouth daily.        Allergies:  Allergies  Allergen Reactions   Sulfamethoxazole Other (See Comments)     Makes me loopy.   Neomycin-Bacitracin  Zn-Polymyx Rash   Sulfa Antibiotics Other (See Comments)    Acts goofy    Past Medical History, Surgical history, Social history, and Family History were reviewed and updated.  Review of Systems: Review of Systems  Constitutional: Negative.   HENT: Negative.    Eyes: Negative.   Respiratory: Negative.    Cardiovascular: Negative.   Gastrointestinal: Negative.   Genitourinary: Negative.   Musculoskeletal: Negative.   Skin: Negative.   Neurological: Negative.   Endo/Heme/Allergies: Negative.   Psychiatric/Behavioral: Negative.       Physical Exam:  height is 5' 6 (1.676 m) and weight is 263 lb (119.3 kg). Her oral temperature is 97.8 F (36.6 C). Her blood pressure is 118/67 and her pulse is 88. Her respiration is 20 and oxygen saturation is 99%.   Wt Readings from Last 3 Encounters:  12/30/23 263 lb (119.3 kg)  10/28/23 248 lb 6.4 oz (112.7 kg)  10/06/23 243 lb (110.2 kg)   Physical Exam Vitals reviewed.  HENT:     Head: Normocephalic and atraumatic.  Eyes:     Pupils: Pupils are equal, round, and reactive to light.  Cardiovascular:     Rate and Rhythm: Normal rate and regular rhythm.     Heart sounds: Normal heart sounds.  Pulmonary:     Effort: Pulmonary effort is normal.     Breath sounds: Normal breath sounds.  Abdominal:     General: Bowel sounds are normal.     Palpations: Abdomen is soft.     Comments: Abdominal exam shows laparoscopic scars overall in the left side of her abdomen.  There is no hernia.  She has good no tenderness to palpation.  There is no fluid wave.  There is no palpable liver or spleen tip.    Musculoskeletal:        General: No tenderness or deformity. Normal range of motion.     Cervical back: Normal range of motion.  Lymphadenopathy:     Cervical: No cervical adenopathy.  Skin:    General: Skin is warm and dry.     Findings: No erythema or rash.  Neurological:     Mental Status: She is  alert and oriented to person, place, and time.  Psychiatric:        Behavior: Behavior normal.        Thought Content: Thought content normal.        Judgment: Judgment normal.     Lab Results  Component Value Date   WBC 6.6 12/30/2023   HGB 12.4 12/30/2023  HCT 37.4 12/30/2023   MCV 85.4 12/30/2023   PLT 273 12/30/2023   Lab Results  Component Value Date   FERRITIN 99 06/17/2021   IRON 48 06/17/2021   TIBC 311 06/17/2021   UIBC 263 06/17/2021   IRONPCTSAT 15 06/17/2021   Lab Results  Component Value Date   RBC 4.38 12/30/2023   No results found for: KPAFRELGTCHN, LAMBDASER, KAPLAMBRATIO No results found for: IGGSERUM, IGA, IGMSERUM No results found for: STEPHANY CARLOTA BENSON MARKEL EARLA JOANNIE DOC VICK, SPEI   Chemistry      Component Value Date/Time   NA 135 09/18/2023 1433   K 3.2 (L) 09/18/2023 1433   CL 100 09/18/2023 1433   CO2 22 09/18/2023 1433   BUN 26 (H) 09/18/2023 1433   CREATININE 0.77 09/18/2023 1433   CREATININE 0.84 11/15/2019 1516      Component Value Date/Time   CALCIUM  8.5 (L) 09/18/2023 1433   ALKPHOS 83 09/18/2023 1433   AST 23 09/18/2023 1433   ALT 17 09/18/2023 1433   BILITOT 0.4 09/18/2023 1433       Impression and Plan: Ms. Frary is a very charming 58 yo white female with localized rectal cancer, stage IIA.   We tried her on neoadjuvant chemotherapy which she tolerated incredibly poorly.  As such, we finally did get her to surgery.  She had no residual disease at surgery.  I am so glad that she had the hernia surgery.  Her life is a lot better now.  Now, the problem is sciatica.  She probably needs to have an MRI of the lumbar spine.  We will go ahead and get that set up.  Her last MRI of the rectum was done back in June.  We probably need to get another 1 set up.  Will try to get all this done before the new year.  I will plan to see her back probably in a couple months now.  I  think of all looks good at that time, then we can start moving her appointments out further further.   Maude JONELLE Crease, MD 12/17/20254:12 PM

## 2024-01-06 ENCOUNTER — Other Ambulatory Visit (HOSPITAL_BASED_OUTPATIENT_CLINIC_OR_DEPARTMENT_OTHER): Payer: Self-pay

## 2024-01-13 ENCOUNTER — Ambulatory Visit (HOSPITAL_COMMUNITY)
Admission: RE | Admit: 2024-01-13 | Discharge: 2024-01-13 | Disposition: A | Discharge status: Home / Self Care | Source: Ambulatory Visit | Attending: Hematology & Oncology | Admitting: Hematology & Oncology

## 2024-01-13 DIAGNOSIS — M47896 Other spondylosis, lumbar region: Secondary | ICD-10-CM

## 2024-01-13 DIAGNOSIS — C20 Malignant neoplasm of rectum: Secondary | ICD-10-CM

## 2024-01-13 MED ORDER — GADOBUTROL 1 MMOL/ML IV SOLN
10.0000 mL | Freq: Once | INTRAVENOUS | Status: AC | PRN
  Administered 2024-01-13: 10 mL via INTRAVENOUS

## 2024-01-15 ENCOUNTER — Ambulatory Visit: Payer: Self-pay | Admitting: Hematology & Oncology

## 2024-01-19 ENCOUNTER — Encounter: Payer: Self-pay | Admitting: Hematology & Oncology

## 2024-01-25 ENCOUNTER — Telehealth: Payer: Self-pay | Admitting: *Deleted

## 2024-01-25 NOTE — Telephone Encounter (Signed)
 Patient called about the MRI results of her spine.  She states she is still having pain and pinching running down her right leg.  Dr Timmy notified.  Will contact Dr Beuford to have her seen.  Patient notified.

## 2024-02-01 ENCOUNTER — Other Ambulatory Visit (HOSPITAL_BASED_OUTPATIENT_CLINIC_OR_DEPARTMENT_OTHER): Payer: Self-pay

## 2024-02-01 MED ORDER — METHOCARBAMOL 500 MG PO TABS
500.0000 mg | ORAL_TABLET | Freq: Four times a day (QID) | ORAL | 2 refills | Status: AC | PRN
  Filled 2024-02-01: qty 60, 8d supply, fill #0

## 2024-02-04 ENCOUNTER — Other Ambulatory Visit: Payer: Self-pay

## 2024-02-04 ENCOUNTER — Other Ambulatory Visit (HOSPITAL_BASED_OUTPATIENT_CLINIC_OR_DEPARTMENT_OTHER): Payer: Self-pay

## 2024-02-24 ENCOUNTER — Inpatient Hospital Stay

## 2024-02-24 ENCOUNTER — Inpatient Hospital Stay: Admitting: Hematology & Oncology

## 2024-09-27 ENCOUNTER — Ambulatory Visit: Admitting: Neurology
